# Patient Record
Sex: Female | Born: 1935
Health system: Southern US, Community
[De-identification: ages and names within clinical notes are randomized; demographics above are authoritative.]

## PROBLEM LIST (undated history)

## (undated) DIAGNOSIS — I10 Essential (primary) hypertension: Secondary | ICD-10-CM

## (undated) DIAGNOSIS — D649 Anemia, unspecified: Secondary | ICD-10-CM

## (undated) DIAGNOSIS — K5730 Diverticulosis of large intestine without perforation or abscess without bleeding: Secondary | ICD-10-CM

## (undated) DIAGNOSIS — I739 Peripheral vascular disease, unspecified: Secondary | ICD-10-CM

## (undated) DIAGNOSIS — E782 Mixed hyperlipidemia: Secondary | ICD-10-CM

## (undated) DIAGNOSIS — K219 Gastro-esophageal reflux disease without esophagitis: Secondary | ICD-10-CM

## (undated) DIAGNOSIS — E119 Type 2 diabetes mellitus without complications: Secondary | ICD-10-CM

## (undated) DIAGNOSIS — I779 Disorder of arteries and arterioles, unspecified: Secondary | ICD-10-CM

## (undated) HISTORY — DX: Mixed hyperlipidemia: E78.2

## (undated) HISTORY — DX: Peripheral vascular disease, unspecified: I73.9

## (undated) HISTORY — DX: Disorder of arteries and arterioles, unspecified: I77.9

## (undated) HISTORY — DX: Anemia, unspecified: D64.9

## (undated) HISTORY — DX: Essential (primary) hypertension: I10

## (undated) HISTORY — DX: Diverticulosis of large intestine without perforation or abscess without bleeding: K57.30

## (undated) HISTORY — PX: EYE SURGERY: SHX253

## (undated) HISTORY — DX: Gastro-esophageal reflux disease without esophagitis: K21.9

## (undated) HISTORY — DX: Type 2 diabetes mellitus without complications: E11.9

---

## 2006-05-20 ENCOUNTER — Ambulatory Visit: Payer: Self-pay | Admitting: Cardiology

## 2006-06-11 ENCOUNTER — Ambulatory Visit: Payer: Self-pay | Admitting: Gastroenterology

## 2006-06-11 ENCOUNTER — Ambulatory Visit (HOSPITAL_COMMUNITY): Admission: RE | Admit: 2006-06-11 | Discharge: 2006-06-11 | Payer: Self-pay | Admitting: Gastroenterology

## 2009-08-02 ENCOUNTER — Ambulatory Visit: Payer: Self-pay | Admitting: Vascular Surgery

## 2009-08-24 ENCOUNTER — Encounter: Payer: Self-pay | Admitting: Vascular Surgery

## 2009-08-24 ENCOUNTER — Ambulatory Visit: Payer: Self-pay | Admitting: Vascular Surgery

## 2009-08-24 ENCOUNTER — Inpatient Hospital Stay (HOSPITAL_COMMUNITY): Admission: RE | Admit: 2009-08-24 | Discharge: 2009-08-25 | Payer: Self-pay | Admitting: Vascular Surgery

## 2009-08-24 HISTORY — PX: CAROTID ENDARTERECTOMY: SUR193

## 2009-08-24 HISTORY — PX: OTHER SURGICAL HISTORY: SHX169

## 2009-09-13 ENCOUNTER — Ambulatory Visit: Payer: Self-pay | Admitting: Vascular Surgery

## 2010-03-06 ENCOUNTER — Ambulatory Visit: Admit: 2010-03-06 | Payer: Self-pay | Admitting: Vascular Surgery

## 2010-03-06 ENCOUNTER — Ambulatory Visit
Admission: RE | Admit: 2010-03-06 | Discharge: 2010-03-06 | Payer: Self-pay | Source: Home / Self Care | Attending: Vascular Surgery | Admitting: Vascular Surgery

## 2010-05-12 LAB — CBC
Hemoglobin: 10 g/dL — ABNORMAL LOW (ref 12.0–15.0)
Hemoglobin: 10.8 g/dL — ABNORMAL LOW (ref 12.0–15.0)
MCH: 33.7 pg (ref 26.0–34.0)
MCHC: 33.7 g/dL (ref 30.0–36.0)
RBC: 2.96 MIL/uL — ABNORMAL LOW (ref 3.87–5.11)
RBC: 3.27 MIL/uL — ABNORMAL LOW (ref 3.87–5.11)
RDW: 17.6 % — ABNORMAL HIGH (ref 11.5–15.5)
RDW: 17.7 % — ABNORMAL HIGH (ref 11.5–15.5)

## 2010-05-12 LAB — COMPREHENSIVE METABOLIC PANEL
ALT: 13 U/L (ref 0–35)
AST: 20 U/L (ref 0–37)
Alkaline Phosphatase: 77 U/L (ref 39–117)
BUN: 11 mg/dL (ref 6–23)
CO2: 28 mEq/L (ref 19–32)
Chloride: 106 mEq/L (ref 96–112)
GFR calc Af Amer: >60 mL/min (ref >60)
Glucose, Bld: 87 mg/dL (ref 70–99)
Potassium: 4.2 mEq/L (ref 3.5–5.1)

## 2010-05-12 LAB — GLUCOSE, CAPILLARY
Glucose-Capillary: 106 mg/dL — ABNORMAL HIGH (ref 70–99)
Glucose-Capillary: 213 mg/dL — ABNORMAL HIGH (ref 70–99)
Glucose-Capillary: 79 mg/dL (ref 70–99)
Glucose-Capillary: 88 mg/dL (ref 70–99)

## 2010-05-12 LAB — TYPE AND SCREEN

## 2010-05-12 LAB — URINE MICROSCOPIC-ADD ON

## 2010-05-12 LAB — URINALYSIS, ROUTINE W REFLEX MICROSCOPIC
Bilirubin Urine: NEGATIVE
Hgb urine dipstick: NEGATIVE
Ketones, ur: NEGATIVE mg/dL
Nitrite: NEGATIVE
Protein, ur: NEGATIVE mg/dL

## 2010-05-12 LAB — HEMOGLOBIN A1C: Hgb A1c MFr Bld: 7.3 % — ABNORMAL HIGH (ref <5.7)

## 2010-05-12 LAB — BASIC METABOLIC PANEL
CO2: 27 mEq/L (ref 19–32)
Calcium: 8.8 mg/dL (ref 8.4–10.5)
Chloride: 100 mEq/L (ref 96–112)
GFR calc Af Amer: >60 mL/min (ref >60)
Glucose, Bld: 213 mg/dL — ABNORMAL HIGH (ref 70–99)

## 2010-05-12 LAB — PROTIME-INR
INR: 0.98 (ref 0.00–1.49)
Prothrombin Time: 12.9 seconds (ref 11.6–15.2)

## 2010-05-12 LAB — ABO/RH: ABO/RH(D): O POS

## 2010-05-12 LAB — APTT: aPTT: 30 seconds (ref 24–37)

## 2010-07-09 NOTE — Procedures (Signed)
CAROTID DUPLEX EXAM   INDICATION:  Carotid disease.   HISTORY:  Diabetes:  Yes.  Cardiac:  No.  Hypertension:  Yes.  Smoking:  Yes.  Previous Surgery:  No.  CV History:  Currently asymptomatic.  Amaurosis Fugax No, Paresthesias No, Hemiparesis No.                                       RIGHT             LEFT  Brachial systolic pressure:         154               152  Brachial Doppler waveforms:         Normal            Normal  Vertebral direction of flow:        Antegrade         Antegrade  DUPLEX VELOCITIES (cm/sec)  CCA peak systolic                   74                57  ECA peak systolic                   99                123456  ICA peak systolic                   102               99991111  ICA end diastolic                   21                323  PLAQUE MORPHOLOGY:                  Calcific          Calcific  PLAQUE AMOUNT:                      Mild              Severe  PLAQUE LOCATION:                    ICA/distal CCA    ICA/ECA/distal CCA   IMPRESSION:  1. No hemodynamically significant stenosis of the right internal      carotid artery.  2. Doppler velocities suggest an 80% to 99% stenosis of the left      proximal internal carotid artery.   ___________________________________________  Judeth Cornfield. Scot Dock, M.D.   CH/MEDQ  D:  08/02/2009  T:  08/02/2009  Job:  [id number]

## 2010-07-09 NOTE — Assessment & Plan Note (Signed)
OFFICE VISIT   Marie, Hunt  DOB:  04/16/1932                                       09/13/2009  [id number]   I saw the patient in the office today for followup after her recent left  carotid endarterectomy.  This is a pleasant 77 year old woman who was  found to have a left carotid bruit by Dr. Manuella Ghazi.  This prompted a duplex  scan which showed a greater than 80% left carotid stenosis.  Left  carotid endarterectomy was recommended in order to lower her risk of  future stroke.  She underwent left carotid endarterectomy with Dacron  patch angioplasty on 08/24/2009.  She did well postoperatively and was  discharged on postoperative day #1.  She returns for her first followup  visit.  She has no specific complaints and overall has been doing quite  well.  She is anxious to return to work.   On examination her incision has healed nicely.  Blood pressure is  161/70, heart rate is 77.  She has no focal weakness or paresthesias.   Overall I am pleased with progress.  I will see her back in 6 months  with a followup duplex scan.  She knows to call sooner if she has  problems.     Judeth Cornfield. Scot Dock, M.D.  Electronically Signed   CSD/MEDQ  D:  09/13/2009  T:  09/14/2009  Job:  [id number]   cc:   Monico Blitz, MD

## 2010-07-09 NOTE — Consult Note (Signed)
NEW PATIENT CONSULTATION   Marie Hunt, Marie Hunt  DOB:  12/10/1932                                       08/02/2009  [id number]   I saw the patient in the office today in consultation concerning a tight  left carotid stenosis.  This is a pleasant 77 year old right-handed  woman who was found to have a left carotid bruit.  She was sent for  carotid evaluation.  Of note, she has had no previous history of stroke,  TIAs, expressive or receptive aphasia, or amaurosis fugax.   Her past medical history is significant for diabetes, hypertension and  hypercholesterolemia, all of which have been stable on her current  medications and are followed closely by Dr. Manuella Ghazi.  She denies any  history of previous myocardial infarction, history of congestive heart  failure or history of COPD.   SOCIAL HISTORY:  She is widowed.  She has six children.  She smokes a  half a pack per day and has been smoking for 50 years.   FAMILY HISTORY:  She has had multiple sisters who had coronary artery  disease.  She is unaware of any history of premature cardiovascular  disease.   Allergies are codeine which causes nausea, vomiting.   MEDICATIONS:  1. Lasix 20 mg p.o. daily.  2. Aspirin 81 mg p.o. daily.  3. Diclofenac sodium 75 mg p.o. daily.  4. Metoprolol ER 25 mg p.o. daily.  5. Amlodipine 10 mg p.o. daily.  6. Metformin 500 mg p.o. b.i.d.  7. Simvastatin 20 mg p.o. daily.  8. Os-Cal daily.   REVIEW OF SYSTEMS:  GENERAL:  She has had no recent weight loss.  She  has had some mild weight gain.  She has had no change in her appetite.  CARDIOVASCULAR:  She has had no chest pain, chest pressure, palpitations  or arrhythmias.  She has had no claudication, rest pain or nonhealing  ulcers.  She has had no history of DVT or phlebitis.  HEMATOLOGIC:  She does have a history of anemia.  MUSCULOSKELETAL:  She does have a history of arthritis.  GI, neurologic, pulmonary, GU, ENT,  psychiatric review of systems is  unremarkable and is documented on the medical history form in her chart.   PHYSICAL EXAMINATION:  General:  This is a pleasant 77 year old woman  who appears her stated age.  Vital signs:  Blood pressure 116/67, heart  rate is 73, saturation 100%.  HEENT:  Unremarkable.  Lungs:  Are clear  bilaterally to auscultation without rales, rhonchi or wheezing.  Cardiovascular:  She has a harsh left carotid bruit.  She has a regular  rate and rhythm.  She has palpable femoral pulses and warm, well-  perfused feet without ischemic ulcers.  Abdomen:  Soft and nontender  with normal pitched bowel sounds.  No masses are appreciated.  Musculoskeletal:  There are no major deformities or cyanosis.  Neurological:  She has no focal weakness or paresthesias.  Skin:  There  are no ulcers or rashes.   I have ordered a carotid duplex scan which I independently interpreted  and this shows a very tight left carotid stenosis probably greater than  90%.  Peak systolic velocity is 99991111 cm/sec with an end-diastolic  velocity of XX123456 cm/sec.  There is no significant stenosis on the right  side and both vertebral arteries  are patent with normally directed flow.  Arm pressures are essentially equal.   Given the tight left carotid stenosis I have recommended left carotid  endarterectomy in order to lower her risk of future stroke.  I have  explained that although she is asymptomatic given the stenosis is  clearly greater than 80% we can lower her risk of stroke with surgery.  We have discussed the indications for surgery and the potential  complications including but not limited to bleeding, stroke (peri  procedural risk 1%-2%), nerve injury, MI, or other unpredictable medical  problems.  All of her questions were answered, however, currently she  would not like this to schedule surgery until she has a chance to  discuss this with Dr. Manuella Ghazi.  I have encouraged her to continue taking   her aspirin and have also discussed the importance of tobacco cessation.  Hopefully we will hear from her in the near future to schedule left  carotid endarterectomy.     Judeth Cornfield. Scot Dock, M.D.  Electronically Signed   CSD/MEDQ  D:  08/02/2009  T:  08/03/2009  Job:  [id number]   cc:   Monico Blitz, MD

## 2010-07-09 NOTE — Assessment & Plan Note (Signed)
OFFICE VISIT   Marie Hunt, Marie Hunt  DOB:  Dec 11, 1932                                       03/06/2010  [id number]   The patient is seen in the office today for continued follow-up of her  carotid disease.  This is a pleasant 77 year old woman who underwent  left carotid endarterectomy in July 2011 for a tight left carotid  stenosis.  She comes in for a 50-month follow-up visit.  Since I saw her  last she has had no history of stroke, TIAs, expressive or receptive  aphasia or amaurosis fugax.   SOCIAL HISTORY:  She does continue to smoke half a pack per day of  cigarettes.   PAST MEDICAL HISTORY:  Significant for diabetes, hypertension, and  hypercholesterolemia.  She has no history of previous myocardial  infarction or history congestive heart failure.   REVIEW OF SYSTEMS:  CARDIOVASCULAR:  She had no chest pain, chest  pressure, palpitations or arrhythmias.  PULMONARY:  She had no productive cough,  bronchitis, asthma or  wheezing.  MUSCULOSKELETAL:  She has had some arthritic pain in her left hand.  She  also complained of some bilateral lower extremity swelling more  significant on the left side.   PHYSICAL EXAMINATION:  This is a pleasant 77 year old woman who appears  her stated age.  Blood pressure is 187/67, heart rate is 81, saturation  98%.  Lungs:  Clear bilaterally to auscultation without rales, rhonchi  or wheezing.  Cardiovascular exam:  I do not detect any carotid bruits.  She has a regular rate and rhythm.  She has palpable femoral pulses and  warm well-perfused feet.  She has bilateral pitting edema more  significant on the left side.  Abdomen:  Soft and nontender with normal  pitched bowel sounds.  Neurologic exam:  She has no focal weakness or  paresthesias.   I did independently interpret her carotid duplex scan today which shows  that her left carotid endarterectomy site is widely patent.  She has no  significant stenosis  on the right side.  Both vertebral arteries are  patent with normally directed flow.   Overall I am pleased with her progress and I have ordered follow-up  carotid duplex scan in 6 months.  I will plan on seeing her back in 1  year unless she has any change in her duplex.  I have encouraged her to  continue taking her aspirin and we have also discussed again the  importance of tobacco cessation.     Judeth Cornfield. Scot Dock, M.D.  Electronically Signed   CSD/MEDQ  D:  03/06/2010  T:  03/07/2010  Job:  [id number]   cc:   Monico Blitz, MD

## 2010-07-09 NOTE — Procedures (Signed)
CAROTID DUPLEX EXAM   INDICATION:  Follow up left CEA.   HISTORY:  Diabetes:  Yes.  Cardiac:  No.  Hypertension:  Yes.  Smoking:  Yes.  Previous Surgery:  Left CEA 08/24/09.  CV History:  Amaurosis Fugax No, Paresthesias No, hemiparesis No.                                       RIGHT             LEFT  Brachial systolic pressure:         180               170  Brachial Doppler waveforms:         WNL               WNL  Vertebral direction of flow:        Antegrade         Antegrade  DUPLEX VELOCITIES (cm/sec)  CCA peak systolic                   74                78  ECA peak systolic                   67                [id number]  ICA peak systolic                   87 (M)            35  ICA end diastolic                   23 (M)            12  PLAQUE MORPHOLOGY:                  Calcific          NA  PLAQUE AMOUNT:                      Mild              NA  PLAQUE LOCATION:                    ICA/ECA           NA   IMPRESSION:  1. Widely patent left carotid endarterectomy without evidence of      restenosis or hyperplasia.  2. Calcific plaque in the right internal carotid artery prevents      accurate evaluation; however, waveforms and velocities just distal      to acoustic shadow appear within normal limits.  1% to 39%      suspected right internal carotid artery stenosis.  3. Bilateral antegrade vertebral arteries.   ___________________________________________  Judeth Cornfield. Scot Dock, M.D.   LT/MEDQ  D:  03/06/2010  T:  03/06/2010  Job:  [id number]

## 2010-07-12 NOTE — Op Note (Signed)
NAME:  Marie Hunt, Marie Hunt            ACCOUNT NO.:  [id number]   MEDICAL RECORD NO.:  [id number]          PATIENT TYPE:  AMB   LOCATION:  DAY                           FACILITY:  [facility]   PHYSICIAN:  Caro Hight, M.D.      DATE OF BIRTH:  08-23-1932   DATE OF PROCEDURE:  06/11/2006  DATE OF DISCHARGE:                               OPERATIVE REPORT   PROCEDURE PERFORMED:  Colonoscopy.   ENDOSCOPIST:  Caro Hight, M.D.   INDICATIONS FOR PROCEDURE:  Marie Hunt is a 77 year old female who  presents for average-risk colon cancer screening.   FINDINGS:  1. Sigmoid diverticulosis; otherwise normal: without evidence of      polyps, masses, inflammatory changes, or AVMs.  2. Normal retroflexed view of the rectum.   RECOMMENDATIONS:  1. High-fiber diet:  Handout given on diverticulosis and high-fiber      diet.  2. Screening colonoscopy in 10 years.   MEDICATIONS:  1. Demerol 75 mg IV.  2. Versed 4 mg IV.   PROCEDURE TECHNIQUE:  A physical exam was performed and informed consent  was obtained from the patient after explaining the benefits, risks, and  alternatives to the procedure.  The patient was connected to the monitor  and placed in the left lateral position.  Continuous oxygen was provided  with nasal cannula and IV medications administered through an indwelling  cannula.  After administration of sedation and rectal exam, the  patient's rectum  was intubated and the scope was advanced under direct visualization to  the cecum.  The scope was subsequently removed, slowly, by carefully  examining the color, texture, anatomy, and integrity of the mucosa on  the way out.  The patient was recovered in endoscopy and discharged home  in satisfactory condition.      Caro Hight, M.D.  Electronically Signed     SM/MEDQ  D:  06/11/2006  T:  06/11/2006  Job:  [id number]   cc:   Monico Blitz, MD  Fax: [phone]

## 2010-09-12 ENCOUNTER — Other Ambulatory Visit (INDEPENDENT_AMBULATORY_CARE_PROVIDER_SITE_OTHER): Payer: Medicare Other

## 2010-09-12 DIAGNOSIS — Z48812 Encounter for surgical aftercare following surgery on the circulatory system: Secondary | ICD-10-CM

## 2010-09-12 DIAGNOSIS — I6529 Occlusion and stenosis of unspecified carotid artery: Secondary | ICD-10-CM

## 2010-09-18 NOTE — Procedures (Unsigned)
CAROTID DUPLEX EXAM  INDICATION:  Followup carotid stenosis.  HISTORY: Diabetes:  Yes Cardiac:  No Hypertension:  Yes Smoking:  Currently Previous Surgery:  Left carotid endarterectomy on 08/24/2009 CV History:  Asymptomatic Amaurosis Fugax No, Paresthesias No, Hemiparesis No                                      RIGHT             LEFT Brachial systolic pressure:         186               166 Brachial Doppler waveforms:         WNL               WNL Vertebral direction of flow:        Antegrade         Antegrade DUPLEX VELOCITIES (cm/sec) CCA peak systolic                   75                96 ECA peak systolic                   149               0000000 ICA peak systolic                   108               63 ICA end diastolic                   10                15 PLAQUE MORPHOLOGY:                  Heterogeneous     Not visualized PLAQUE AMOUNT:                      Mild              Not visualized PLAQUE LOCATION:                    CCA/ICA/ECA       Not visualized  IMPRESSION: 1. Right internal carotid artery stenosis in the 1% to 39% range. 2. Right external carotid artery stenosis present. 3. Left internal carotid artery appears patent with history of     endarterectomy on 08/24/2009. 4. Unchanged since previous study on 03/06/2010.  ___________________________________________ Judeth Cornfield. Scot Dock, M.D.  SH/MEDQ  D:  09/12/2010  T:  09/12/2010  Job:  [id number]

## 2010-10-23 ENCOUNTER — Ambulatory Visit: Payer: Medicare Other | Admitting: Cardiology

## 2010-10-23 ENCOUNTER — Encounter: Payer: Self-pay | Admitting: Cardiology

## 2010-10-30 ENCOUNTER — Encounter: Payer: Self-pay | Admitting: Cardiology

## 2010-11-08 ENCOUNTER — Other Ambulatory Visit: Payer: Self-pay | Admitting: Cardiology

## 2010-11-08 ENCOUNTER — Ambulatory Visit (INDEPENDENT_AMBULATORY_CARE_PROVIDER_SITE_OTHER): Payer: Medicare Other | Admitting: Cardiology

## 2010-11-08 ENCOUNTER — Telehealth: Payer: Self-pay | Admitting: *Deleted

## 2010-11-08 ENCOUNTER — Encounter: Payer: Self-pay | Admitting: Cardiology

## 2010-11-08 DIAGNOSIS — K8020 Calculus of gallbladder without cholecystitis without obstruction: Secondary | ICD-10-CM

## 2010-11-08 DIAGNOSIS — I10 Essential (primary) hypertension: Secondary | ICD-10-CM

## 2010-11-08 DIAGNOSIS — I779 Disorder of arteries and arterioles, unspecified: Secondary | ICD-10-CM

## 2010-11-08 DIAGNOSIS — Z01818 Encounter for other preprocedural examination: Secondary | ICD-10-CM

## 2010-11-08 DIAGNOSIS — E785 Hyperlipidemia, unspecified: Secondary | ICD-10-CM

## 2010-11-08 DIAGNOSIS — O24919 Unspecified diabetes mellitus in pregnancy, unspecified trimester: Secondary | ICD-10-CM | POA: Insufficient documentation

## 2010-11-08 NOTE — Progress Notes (Signed)
HPI: Marie Hunt is a pleasant 77 y/o patient of Dr. Manuella Ghazi PCP in [place], and Dr. Truddie Hidden, general surgeon in [place] we are asked to see for pre-operative evaluation for cholecystectomy.  She has a history of PAD, with carotid artery disease, s/p left carotid artery endarterectomy, Type II diabetes, hypertension, and mixed hyperlipidemia.  She has no known CAD.   She apparently has had cardiac testing in the past to include stress myoview and echocardiogram at [facility] in [place]. Records are being requested  She denies chest pain, DOE, dizziness.  She has GERD symptoms and low grade nausea at times. She remains active, walking and working in a [employer] in [employer]. She has no symptoms that are stopping her from her activities. She has complaints of Left leg LEE, and has had a recent doppler ultrasound with Dr. Manuella Ghazi this week. Records are being requested.  Allergies  Allergen Reactions  . Codeine Nausea And Vomiting    Current Outpatient Prescriptions  Medication Sig Dispense Refill  . amLODipine (NORVASC) 10 MG tablet Take 10 mg by mouth daily.        Marland Kitchen aspirin 81 MG tablet Take 81 mg by mouth daily.        . calcium-vitamin D (OSCAL WITH D) 500-200 MG-UNIT per tablet Take 1 tablet by mouth daily.        . diclofenac (VOLTAREN) 75 MG EC tablet Take 75 mg by mouth 2 (two) times daily.        . ferrous sulfate 325 (65 FE) MG tablet Take 325 mg by mouth daily with breakfast.        . furosemide (LASIX) 20 MG tablet Take 20 mg by mouth daily as needed.        Marland Kitchen glimepiride (AMARYL) 2 MG tablet Take 2 mg by mouth daily before breakfast.        . metFORMIN (GLUCOPHAGE) 500 MG tablet Take 500 mg by mouth 2 (two) times daily with a meal. Take 2 tabs by mouth 2 times a day.       . metoprolol succinate (TOPROL-XL) 25 MG 24 hr tablet Take 25 mg by mouth daily.        Marland Kitchen omeprazole (PRILOSEC) 20 MG capsule Take 20 mg by mouth daily.        . simvastatin (ZOCOR) 20 MG tablet Take 20 mg by mouth  at bedtime.          Past Medical History  Diagnosis Date  . GERD (gastroesophageal reflux disease)   . Carotid artery disease     123456 RICA, patent LICA [id number] - Dr. Scot Dock  . Essential hypertension, benign   . Type 2 diabetes mellitus   . Mixed hyperlipidemia   . Sigmoid diverticulosis   . Cholelithiasis   . CVA (cerebral infarction)   . Stroke   . Diabetes mellitus     Type II  . Cholelithiasis     In need of cholecystectomy  . Sigmoid diverticulosis   . GERD (gastroesophageal reflux disease)     Past Surgical History  Procedure Date  . Left carotid endarterectomy 7/11    Dr. Scot Dock  . Carotid endarterectomy 08/24/2009    Left catroid endarterectomy    Family History  Problem Relation Age of Onset  . Coronary artery disease    . Heart disease Sister   . Heart disease Sister   . Heart disease Sister     History   Social History  . Marital Status: Widowed  Spouse Name: N/A    Number of Children: N/A  . Years of Education: N/A   Occupational History  . Not on file.   Social History Main Topics  . Smoking status: Current Everyday Smoker -- 0.5 packs/day for 60 years    Types: Cigarettes  . Smokeless tobacco: Never Used  . Alcohol Use: No  . Drug Use: No  . Sexually Active: No   Other Topics Concern  . Not on file   Social History Narrative  . No narrative on file    [id number] of systems complete and found to be negative unless listed above  PHYSICAL EXAM BP 131/57  Pulse 67  Resp 18  Ht 5\' 3"  (1.6 m)  Wt 175 lb (79.379 kg)  BMI 31.00 kg/m2  SpO2 97% General: Well developed, well nourished, in no acute distress Head: Eyes PERRLA, No xanthomas.   Normal cephalic and atramatic  Lungs: Clear bilaterally to auscultation and percussion. Heart: HRRR S1 S2, with 2/6 RSB and 1/6 RSB radiating into the apex.  Pulses are 2+ & equal radially.           Left carotid bruit. No JVD.  No abdominal bruits. No femoral bruits. Abdomen: Bowel sounds are  positive, abdomen soft and non-tender without masses or                  Hernia's noted. Msk:  Back normal, normal gait. Normal strength and tone for age. Extremities: No clubbing, cyanosis or with Left leg edema.  DP +1 Neuro: Alert and oriented X 3. Psych:  Good affect, responds appropriately  EKG: NSR, possible Right atrial enlargement.  LVH. Rate of 69 bpm.:  ASSESSMENT AND PLAN

## 2010-11-08 NOTE — Progress Notes (Signed)
Addended by: Dennie Fetters on: 11/08/2010 10:29 AM   Modules accepted: Orders

## 2010-11-08 NOTE — Telephone Encounter (Signed)
Auth # [id number]  Exp 12/23/10

## 2010-11-08 NOTE — Progress Notes (Signed)
Patient seen and examined with Marie Hunt. She is referred for preoperative evaluation prior to elective cholecystectomy based on available information. She is followed by Dr. Manuella Ghazi, also seen recently by Dr. Truddie Hidden. Her history is reviewed including known carotid artery disease status post prior CVA, hypertension, type 2 diabetes mellitus, hyperlipidemia. She has no clearly documented history of ischemic heart disease. 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 No palpitations or syncope. Main complaints include right upper and lower quadrant discomfort, some right leg discomfort, intermittent lower extremity edema as well.  She reports that she may have had a stress test at [facility] internal medicine approximately a year ago, however no results are available. It also appears that she may have had a stress test at [facility] in 2008, although again results are not available. We were able to locate an echocardiogram done at [facility] internal medicine in May of 2011 demonstrating an LVEF of 60-70% with mild LVH, diastolic dysfunction, mildly sclerotic aortic valve, trace mitral regurgitation with thickened leaflets, mild aortic regurgitation, and mild tricuspid regurgitation. Also minimally increased trans-pulmonic velocities.  On exam she has no loud carotid bruits, lungs are clear, and she does have a 99991111 systolic murmur heard best at the right base, preserved second heart sound. Chronic appearing lower extremity edema.  In light of her cardiac risk factor profile, no ischemic evaluation in 4 years as best I can tell, and cardiac murmur on exam with prior documentation of sclerotic aortic valve and reduced non-coronary cusp motion, our plan will be to followup 2-D echocardiogram to reassess valvular status and also arrange a Lexiscan Myoview to better determine perioperative cardiac risk. When this information  is available, we can forward final recommendations to Dr. Manuella Ghazi and Dr. Truddie Hidden.

## 2010-11-08 NOTE — Assessment & Plan Note (Signed)
Patient has been seen and examined by myself and by Dr. Domenic Polite. Extensive review of available records was also completed. She is asymptomatic at this time from cardiology standpoint. Blood pressure is well controlled. If she has had a negative stress test within the last 2 years no further cardiac testing is recommended prior to surgery and she is considered low risk for cardiac event. Please see Dr. Myles Gip addendum for more recommendations after all records are obtained and reviewed.

## 2010-11-08 NOTE — Telephone Encounter (Signed)
Lexiscan cardiolite and 2 D Echo scheduled for 11-12-2010 @ [facility]

## 2010-11-08 NOTE — Patient Instructions (Signed)
Your physician recommends that you continue on your current medications as directed. Please refer to the Current Medication list given to you today.  Your physician has requested that you have an echocardiogram. Echocardiography is a painless test that uses sound waves to create images of your heart. It provides your doctor with information about the size and shape of your heart and how well your heart's chambers and valves are working. This procedure takes approximately one hour. There are no restrictions for this procedure.  Your physician has requested that you have a lexiscan myoview. For further information please visit [email or web address]. Please follow instruction sheet, as given.  Your physician recommends that you schedule a follow-up appointment in: after test

## 2010-11-11 ENCOUNTER — Encounter: Payer: Self-pay | Admitting: Adult Health

## 2010-11-12 DIAGNOSIS — R0602 Shortness of breath: Secondary | ICD-10-CM

## 2010-11-13 ENCOUNTER — Encounter: Payer: Self-pay | Admitting: *Deleted

## 2010-11-14 ENCOUNTER — Other Ambulatory Visit: Payer: Self-pay | Admitting: Cardiology

## 2010-11-14 ENCOUNTER — Encounter: Payer: Self-pay | Admitting: Cardiology

## 2010-11-14 DIAGNOSIS — Z01818 Encounter for other preprocedural examination: Secondary | ICD-10-CM

## 2010-11-15 ENCOUNTER — Ambulatory Visit (INDEPENDENT_AMBULATORY_CARE_PROVIDER_SITE_OTHER): Payer: Medicare Other | Admitting: Cardiology

## 2010-11-15 ENCOUNTER — Encounter: Payer: Self-pay | Admitting: Cardiology

## 2010-11-15 VITALS — BP 130/70 | HR 65 | Ht 67.0 in | Wt 173.8 lb

## 2010-11-15 DIAGNOSIS — Z01818 Encounter for other preprocedural examination: Secondary | ICD-10-CM

## 2010-11-15 DIAGNOSIS — I779 Disorder of arteries and arterioles, unspecified: Secondary | ICD-10-CM

## 2010-11-15 DIAGNOSIS — R011 Cardiac murmur, unspecified: Secondary | ICD-10-CM | POA: Insufficient documentation

## 2010-11-15 NOTE — Assessment & Plan Note (Signed)
Likely related to aortic sclerosis, no clear aortic stenosis however. Mild aortic regurgitation noted.

## 2010-11-15 NOTE — Assessment & Plan Note (Signed)
Should be able to proceed with elective cholecystectomy if indicated, at an acceptable perioperative cardiac risk. Cardiolite was low risk arguing against any major obstructive underlying CAD, and echocardiogram does not demonstrate any significant valvular abnormalities that would increase her risk. No further cardiac testing is planned at this time. She can continue to follow up with Dr. Manuella Ghazi.

## 2010-11-15 NOTE — Progress Notes (Signed)
Clinical Summary Ms. Katzenstein is a 77 y.o.female presenting for followup. She was seen just recently in the [place] office for a preoperative evaluation prior to elective cholecystectomy. She was referred for followup testing including an echocardiogram and Myoview, outlined below, both of which were reassuring. We discussed these results today.  She denies any chest pain. Her cardiac murmur is likely related to aortic sclerosis, no clear evidence of stenosis by echocardiogram. She continues to contemplate elective cholecystectomy, and plans to see Dr. Truddie Hidden back for further discussion. At this point no further cardiac testing is anticipated.   Allergies  Allergen Reactions  . Codeine Nausea And Vomiting    Medication list reviewed.  Past Medical History  Diagnosis Date  . GERD (gastroesophageal reflux disease)   . Carotid artery disease     123456 RICA, patent LICA [id number] - Dr. Scot Dock  . Essential hypertension, benign   . Type 2 diabetes mellitus   . Mixed hyperlipidemia   . Sigmoid diverticulosis   . Cholelithiasis   . CVA (cerebral infarction)   . Stroke   . Diabetes mellitus     Type II  . Cholelithiasis     In need of cholecystectomy  . Sigmoid diverticulosis   . GERD (gastroesophageal reflux disease)     Past Surgical History  Procedure Date  . Left carotid endarterectomy 7/11    Dr. Scot Dock  . Carotid endarterectomy 08/24/2009    Left catroid endarterectomy    Family History  Problem Relation Age of Onset  . Coronary artery disease    . Heart disease Sister   . Heart disease Sister   . Heart disease Sister     Social History Ms. Leinberger reports that she has been smoking Cigarettes.  She has a 18 pack-year smoking history. She has never used smokeless tobacco. Ms. Pahl reports that she does not drink alcohol.  Review of Systems As outlined above. Otherwise negative.  Physical Examination Filed Vitals:   11/15/10 1124  BP: 130/70  Pulse:  65  Overweight woman in no acute distress. HEENT: Conjunctiva and lids normal, oropharynx clear. Neck: Supple, no elevated JVP or carotid bruits. Lungs: Clear to auscultation, nonlabored. Cardiac: Regular rate and rhythm with 2/6 systolic murmur at the base. No S3. Extremities: No pitting edema.   Studies Echocardiogram 11/12/2010: LVEF 55-60% with mild LVH, diastolic dysfunction, mild aortic calcification without stenosis, mild aortic regurgitation, trace mitral regurgitation, mild tricuspid regurgitation, aortic atherosclerosis.  Lexiscan Cardiolite 11/12/2010: Evidence of attenuation artifact without obvious scar or ischemia, LVEF 61%.   Problem List and Plan

## 2010-11-15 NOTE — Assessment & Plan Note (Signed)
Nonobstructive by recent Dopplers.

## 2010-11-15 NOTE — Patient Instructions (Signed)
Continue all current medications. Follow up as needed  

## 2011-09-16 ENCOUNTER — Encounter: Payer: Self-pay | Admitting: Neurosurgery

## 2011-09-17 ENCOUNTER — Ambulatory Visit (INDEPENDENT_AMBULATORY_CARE_PROVIDER_SITE_OTHER): Payer: Medicare Other | Admitting: Neurosurgery

## 2011-09-17 ENCOUNTER — Ambulatory Visit (INDEPENDENT_AMBULATORY_CARE_PROVIDER_SITE_OTHER): Payer: Medicare Other | Admitting: *Deleted

## 2011-09-17 ENCOUNTER — Encounter: Payer: Self-pay | Admitting: Neurosurgery

## 2011-09-17 VITALS — BP 165/67 | HR 67 | Resp 18 | Ht 64.0 in | Wt 167.0 lb

## 2011-09-17 DIAGNOSIS — I6529 Occlusion and stenosis of unspecified carotid artery: Secondary | ICD-10-CM

## 2011-09-17 DIAGNOSIS — Z48812 Encounter for surgical aftercare following surgery on the circulatory system: Secondary | ICD-10-CM

## 2011-09-17 NOTE — Progress Notes (Signed)
[facility] Carot[facility] Office Note  CC: Annual carotid duplex for surveillance Referring Physician: Scot Dock  History of Present Illness: 78 year old female patient of Dr. Nicole Cella who status post left CEA in July 2011. The patient reports no signs or symptoms of CVA, TIA, amaurosis fugax or any neural deficit. The patient denies any new medical diagnoses or recent surgeries. The patient does ask about her lower extremity edema which is greater on the left side, I explained to the patient the compression hose would help as well as leg elevation and some diuretic. Patient states she takes a diuretic and that her primary care doctor told her the same thing that I did.  Past Medical History  Diagnosis Date  . GERD (gastroesophageal reflux disease)   . Carotid artery disease     123456 RICA, patent LICA [id number] - Dr. Scot Dock  . Essential hypertension, benign   . Type 2 diabetes mellitus   . Mixed hyperlipidemia   . Sigmoid diverticulosis   . Cholelithiasis   . CVA (cerebral infarction)   . Stroke   . Diabetes mellitus     Type II  . Cholelithiasis     In need of cholecystectomy  . Sigmoid diverticulosis   . GERD (gastroesophageal reflux disease)     ROS: [x]  Positive   [ ]  Denies    General: [ ]  Weight loss, [ ]  Fever, [ ]  chills Neurologic: [ ]  Dizziness, [ ]  Blackouts, [ ]  Seizure [ ]  Stroke, [ ]  "Mini stroke", [ ]  Slurred speech, [ ]  Temporary blindness; [ ]  weakness in arms or legs, [ ]  Hoarseness Cardiac: [ ]  Chest pain/pressure, [ ]  Shortness of breath at rest [ ]  Shortness of breath with exertion, [ ]  Atrial fibrillation or irregular heartbeat Vascular: [ ]  Pain in legs with walking, [ ]  Pain in legs at rest, [ ]  Pain in legs at night,  [ ]  Non-healing ulcer, [ ]  Blood clot in vein/DVT,   Pulmonary: [ ]  Home oxygen, [ ]  Productive cough, [ ]  Coughing up blood, [ ]  Asthma,  [ ]  Wheezing Musculoskeletal:  [ ]  Arthritis, [ ]  Low back pain, [ ]  Joint  pain Hematologic: [ ]  Easy Bruising, [ ]  Anemia; [ ]  Hepatitis Gastrointestinal: [ ]  Blood in stool, [ ]  Gastroesophageal Reflux/heartburn, [ ]  Trouble swallowing Urinary: [ ]  chronic Kidney disease, [ ]  on HD - [ ]  MWF or [ ]  TTHS, [ ]  Burning with urination, [ ]  Difficulty urinating Skin: [ ]  Rashes, [ ]  Wounds Psychological: [ ]  Anxiety, [ ]  Depression   Social History History  Substance Use Topics  . Smoking status: Current Everyday Smoker -- 0.3 packs/day for 60 years    Types: Cigarettes  . Smokeless tobacco: Never Used  . Alcohol Use: No    Family History Family History  Problem Relation Age of Onset  . Coronary artery disease    . Heart disease Sister   . Heart disease Sister   . Heart disease Sister     Allergies  Allergen Reactions  . Codeine Nausea And Vomiting    Current Outpatient Prescriptions  Medication Sig Dispense Refill  . amLODipine (NORVASC) 10 MG tablet Take 5 mg by mouth daily.       Marland Kitchen aspirin 81 MG tablet Take 81 mg by mouth daily.        . Calcium-Vitamin D 600-200 MG-UNIT per tablet Take 1 tablet by mouth daily.      . diclofenac (VOLTAREN)  75 MG EC tablet Take 75 mg by mouth 2 (two) times daily.        . ferrous sulfate 325 (65 FE) MG tablet Take 325 mg by mouth daily with breakfast.        . furosemide (LASIX) 20 MG tablet Take 20 mg by mouth daily as needed.        Marland Kitchen glimepiride (AMARYL) 2 MG tablet Take 2 mg by mouth daily before breakfast.        . metFORMIN (GLUCOPHAGE) 500 MG tablet Take 500 mg by mouth 2 (two) times daily with a meal.       . metoprolol succinate (TOPROL-XL) 25 MG 24 hr tablet Take 25 mg by mouth daily.        Marland Kitchen omeprazole (PRILOSEC) 20 MG capsule Take 20 mg by mouth 2 (two) times daily.       . simvastatin (ZOCOR) 20 MG tablet Take 20 mg by mouth at bedtime.        . calcium-vitamin D (OSCAL WITH D) 500-200 MG-UNIT per tablet Take 1 tablet by mouth daily.          Physical Examination  Filed Vitals:   09/17/11 1415   BP: 165/67  Pulse: 67  Resp:     Body mass index is 28.67 kg/(m^2).  General:  WDWN in NAD Gait: Normal HEENT: WNL Eyes: Pupils equal Pulmonary: normal non-labored breathing , without Rales, rhonchi,  wheezing Cardiac: RRR, without  Murmurs, rubs or gallops; Abdomen: soft, NT, no masses Skin: no rashes, ulcers noted  Vascular Exam Pulses: 2+ radial pulses bilaterally Carotid bruits: Carotid pulses to auscultation no bruits are heard Extremities without ischemic changes, no Gangrene , no cellulitis; no open wounds;  Musculoskeletal: no muscle wasting or atrophy   Neurologic: A&O X 3; Appropriate Affect ; SENSATION: normal; MOTOR FUNCTION:  moving all extremities equally. Speech is fluent/normal  Non-Invasive Vascular Imaging CAROTID DUPLEX 09/17/2011  Right ICA 20 - 39 % stenosis Left ICA 0 - 19% stenosis   ASSESSMENT/PLAN: Asymptomatic patient with mild bilateral carotid stenosis with a history of left CEA in 2011. The patient return in one year for repeat carotid duplex, her questions were encouraged and answered, she is in agreement with this plan.  Marie Hunt ANP   Clinic MD: Scot Dock

## 2011-09-17 NOTE — Addendum Note (Signed)
Addended by: Mena Goes on: 09/17/2011 02:27 PM   Modules accepted: Orders

## 2011-09-24 NOTE — Procedures (Unsigned)
CAROTID DUPLEX EXAM  INDICATION:  Carotid endarterectomy  HISTORY: Diabetes:  Yes Cardiac:  No Hypertension:  Yes Smoking:  Yes Previous Surgery:  Left carotid endarterectomy on 08/24/2009 CV History:  Currently asymptomatic Amaurosis Fugax No, Paresthesias No, Hemiparesis No                                      RIGHT             LEFT Brachial systolic pressure:         170               162 Brachial Doppler waveforms:         Normal            Normal Vertebral direction of flow:        Antegrade         Antegrade DUPLEX VELOCITIES (cm/sec) CCA peak systolic                   64                80 ECA peak systolic                   124               [id number] ICA peak systolic                   70                64 ICA end diastolic                   10                18 PLAQUE MORPHOLOGY:                  Calcific PLAQUE AMOUNT:                      Mild/moderate     None PLAQUE LOCATION:                    ICA/ECA  IMPRESSION: 1. Doppler velocity suggests 1%-39% stenosis of the right proximal     internal carotid artery. 2. Patent left carotid endarterectomy site with no left internal     carotid artery stenosis. 3. No significant change noted when compared to the previous exam on     09/12/2010.  ___________________________________________ Judeth Cornfield. Scot Dock, M.D.  CH/MEDQ  D:  09/19/2011  T:  09/19/2011  Job:  [id number]

## 2012-09-22 ENCOUNTER — Other Ambulatory Visit: Payer: Medicare Other

## 2012-09-22 ENCOUNTER — Ambulatory Visit: Payer: Medicare Other | Admitting: Neurosurgery

## 2012-09-23 ENCOUNTER — Encounter: Payer: Self-pay | Admitting: Vascular Surgery

## 2012-09-23 ENCOUNTER — Other Ambulatory Visit (INDEPENDENT_AMBULATORY_CARE_PROVIDER_SITE_OTHER): Payer: Medicare Other | Admitting: *Deleted

## 2012-09-23 DIAGNOSIS — Z48812 Encounter for surgical aftercare following surgery on the circulatory system: Secondary | ICD-10-CM

## 2012-09-23 DIAGNOSIS — I6529 Occlusion and stenosis of unspecified carotid artery: Secondary | ICD-10-CM

## 2012-10-13 ENCOUNTER — Other Ambulatory Visit: Payer: Self-pay | Admitting: *Deleted

## 2012-10-13 DIAGNOSIS — Z48812 Encounter for surgical aftercare following surgery on the circulatory system: Secondary | ICD-10-CM

## 2012-10-14 ENCOUNTER — Encounter: Payer: Self-pay | Admitting: Vascular Surgery

## 2013-04-14 ENCOUNTER — Other Ambulatory Visit: Payer: Self-pay | Admitting: Vascular Surgery

## 2013-04-14 DIAGNOSIS — I6529 Occlusion and stenosis of unspecified carotid artery: Secondary | ICD-10-CM

## 2013-04-14 DIAGNOSIS — Z48812 Encounter for surgical aftercare following surgery on the circulatory system: Secondary | ICD-10-CM

## 2013-09-02 ENCOUNTER — Encounter: Payer: Self-pay | Admitting: Diagnostic Neuroimaging

## 2013-09-14 ENCOUNTER — Encounter: Payer: Medicare Other | Admitting: Radiology

## 2013-09-14 ENCOUNTER — Encounter: Payer: Medicare Other | Admitting: Diagnostic Neuroimaging

## 2013-09-21 ENCOUNTER — Ambulatory Visit (INDEPENDENT_AMBULATORY_CARE_PROVIDER_SITE_OTHER): Payer: Medicare Other | Admitting: Diagnostic Neuroimaging

## 2013-09-21 ENCOUNTER — Ambulatory Visit (INDEPENDENT_AMBULATORY_CARE_PROVIDER_SITE_OTHER): Payer: Self-pay

## 2013-09-21 ENCOUNTER — Encounter: Payer: Medicare Other | Admitting: Diagnostic Neuroimaging

## 2013-09-21 DIAGNOSIS — G5600 Carpal tunnel syndrome, unspecified upper limb: Secondary | ICD-10-CM

## 2013-09-21 DIAGNOSIS — G5603 Carpal tunnel syndrome, bilateral upper limbs: Secondary | ICD-10-CM

## 2013-09-21 DIAGNOSIS — Z0289 Encounter for other administrative examinations: Secondary | ICD-10-CM

## 2013-09-21 DIAGNOSIS — R200 Anesthesia of skin: Secondary | ICD-10-CM

## 2013-09-23 NOTE — Procedures (Signed)
   [facility]  NCS (NERVE CONDUCTION STUDY) WITH EMG (ELECTROMYOGRAPHY) REPORT   STUDY DATE: 09/21/13 PATIENT NAME: Marie Hunt DOB: [birth year] MRN: [id number]  ORDERING CLINICIAN: Anthonette Legato, MD  TECHNOLOGIST: Laretta Alstrom ELECTROMYOGRAPHER: Earlean Polka. Hartwell Vandiver, MD  CLINICAL INFORMATION: 80 year old female with 3-4 months of bilateral hand numbness / pain.  FINDINGS: NERVE CONDUCTION STUDY: Bilateral median motor responses have prolonged distal latencies (right 6.3 ms, left 8.3 ms, normal less than or equal to 4.2 ms), decreased amplitude, normal conduction velocities, normal F-wave latency on the right, prolonged F-wave latency on the left. Bilateral ulnar motor responses and F-wave latencies are normal. Bilateral median sensory responses could not be obtained. Bilateral ulnar sensory responses are normal.  NEEDLE ELECTROMYOGRAPHY: Needle examination of left upper extremity deltoid, biceps, triceps, flexor carpi radialis, first dorsal interosseous is normal.   IMPRESSION:  Abnormal study demonstrating bilateral median neuropathies at the wrists consistent with bilateral carpal tunnel syndrome. This is more severe on the left side.   INTERPRETING PHYSICIAN:  Penni Bombard, MD Certified in Neurology, Neurophysiology and Neuroimaging  [facility] [address], [place], [place] [address] [phone]

## 2013-10-11 ENCOUNTER — Encounter: Payer: Medicare Other | Admitting: Diagnostic Neuroimaging

## 2013-10-12 ENCOUNTER — Other Ambulatory Visit (HOSPITAL_COMMUNITY): Payer: Medicare Other

## 2013-10-12 ENCOUNTER — Ambulatory Visit: Payer: Medicare Other | Admitting: Family

## 2013-10-25 ENCOUNTER — Encounter: Payer: Self-pay | Admitting: Family

## 2013-10-26 ENCOUNTER — Encounter: Payer: Self-pay | Admitting: Family

## 2013-10-26 ENCOUNTER — Ambulatory Visit (HOSPITAL_COMMUNITY)
Admission: RE | Admit: 2013-10-26 | Discharge: 2013-10-26 | Disposition: A | Discharge status: Home / Self Care | Payer: Medicare Other | Source: Ambulatory Visit | Attending: Family | Admitting: Family

## 2013-10-26 ENCOUNTER — Ambulatory Visit (INDEPENDENT_AMBULATORY_CARE_PROVIDER_SITE_OTHER): Payer: Medicare Other | Admitting: Family

## 2013-10-26 VITALS — BP 120/60 | HR 62 | Resp 16 | Ht 64.0 in | Wt 155.0 lb

## 2013-10-26 DIAGNOSIS — Z48812 Encounter for surgical aftercare following surgery on the circulatory system: Secondary | ICD-10-CM

## 2013-10-26 DIAGNOSIS — I6529 Occlusion and stenosis of unspecified carotid artery: Secondary | ICD-10-CM

## 2013-10-26 NOTE — Progress Notes (Signed)
Established Carotid Patient   History of Present Illness  Marie Hunt is a 80 y.o. female patient of Dr. Scot Dock who is status post left CEA in July 2011. She returns today for follow up. Patient denies any history of stroke or TIA, specifically she denies any history of amaurosis fugax or monocular blindness, facial drooping, hemiplegia, or receptive or expressive aphasia.  Pt. denies extremity weakness.  Pt states her PCP is aware of her irregular heart beat. She states she has carpal tunnel syndrome in her left wrist with tingling.  Pt reports New Medical or Surgical History: was hospitalized in July, 2015, for acute kidney failure, she had a UTI. Pt states she is seeing a nephrologist but is much improved.  She also was transfused for severe anemia, is taking iron tabs, denies GI bleeding. She has no claudication symptoms, denies non healing wounds.  Pt Diabetic: Yes Pt smoker: former smoker, quit in 2013  Pt meds include: Statin : Yes ASA: Yes Other anticoagulants/antiplatelets: no   Past Medical History  Diagnosis Date  . GERD (gastroesophageal reflux disease)   . Carotid artery disease     123456 RICA, patent LICA [id number] - Dr. Scot Dock  . Essential hypertension, benign   . Type 2 diabetes mellitus   . Mixed hyperlipidemia   . Sigmoid diverticulosis   . Cholelithiasis   . CVA (cerebral infarction)   . Stroke   . Diabetes mellitus     Type II  . Cholelithiasis     In need of cholecystectomy  . Sigmoid diverticulosis   . GERD (gastroesophageal reflux disease)   . Anemia     Social History History  Substance Use Topics  . Smoking status: Former Smoker -- 0.30 packs/day for 60 years    Types: Cigarettes    Quit date: 10/27/2011  . Smokeless tobacco: Never Used  . Alcohol Use: No    Family History Family History  Problem Relation Age of Onset  . Coronary artery disease    . Heart disease Sister   . Heart disease Sister   . Heart disease Sister   .  Diabetes Mother   . Diabetes Father   . Heart attack Daughter   . Other Daughter     Bleeding problems    Surgical History Past Surgical History  Procedure Laterality Date  . Left carotid endarterectomy  7/11    Dr. Scot Dock  . Carotid endarterectomy  08/24/2009    Left catroid endarterectomy    Allergies  Allergen Reactions  . Codeine Nausea And Vomiting    Current Outpatient Prescriptions  Medication Sig Dispense Refill  . amLODipine (NORVASC) 10 MG tablet Take 5 mg by mouth daily.       Marland Kitchen aspirin 81 MG tablet Take 81 mg by mouth daily.        . B Complex Vitamins (B-COMPLEX/B-12 PO) Take 1,000 mg by mouth daily.      . calcium-vitamin D (OSCAL WITH D) 500-200 MG-UNIT per tablet Take 1 tablet by mouth daily.        . ferrous sulfate 325 (65 FE) MG tablet Take 325 mg by mouth daily with breakfast.        . folic acid (FOLVITE) 1 MG tablet Take 1 mg by mouth daily.      . furosemide (LASIX) 20 MG tablet Take 20 mg by mouth daily as needed.        . metFORMIN (GLUCOPHAGE) 500 MG tablet Take 500 mg by mouth 2 (two)  times daily with a meal.       . metoprolol succinate (TOPROL-XL) 25 MG 24 hr tablet Take 25 mg by mouth daily.        Marland Kitchen omeprazole (PRILOSEC) 20 MG capsule Take 20 mg by mouth 2 (two) times daily.       Glory Rosebush VERIO test strip       . simvastatin (ZOCOR) 20 MG tablet Take 20 mg by mouth at bedtime.        . Calcium-Vitamin D 600-200 MG-UNIT per tablet Take 1 tablet by mouth daily.      . diclofenac (VOLTAREN) 75 MG EC tablet Take 75 mg by mouth 2 (two) times daily.        Marland Kitchen glimepiride (AMARYL) 2 MG tablet Take 2 mg by mouth daily before breakfast.         No current facility-administered medications for this visit.    Review of Systems : See HPI for pertinent positives and negatives.  Physical Examination  Filed Vitals:   10/26/13 0958 10/26/13 1005  BP: 185/72 120/60  Pulse: 62 62  Resp:  16  Height:  5\' 4"  (1.626 m)  Weight:  155 lb (70.308 kg)   SpO2:  100%   Body mass index is 26.59 kg/(m^2).  General: WDWN female in NAD GAIT: normal Eyes: PERRLA Pulmonary:  Non-labored, CTAB, Negative  Rales, Negative rhonchi, & Negative wheezing.  Cardiac: regular Rhythm with frequent premature contractions,  Negative detected murmur.  VASCULAR EXAM Carotid Bruits Right Left   Negative Negative    Aorta is not palpable. Radial pulses: 1+ right, 2+ left. Both brachial pulses are 3+ palpable.                                                                                                                      Gastrointestinal: soft, nontender, BS WNL, no r/g,  negative masses palpated.  Musculoskeletal: Negative muscle atrophy/wasting. M/S 5/5 throughout, Extremities without ischemic changes.  Neurologic: A&O X 3; Appropriate Affect ; SENSATION ;normal;  Speech is normal CN 2-12 intact, Pain and light touch intact in extremities, Motor exam as listed above.   Non-Invasive Vascular Imaging CAROTID DUPLEX 10/26/2013   CEREBROVASCULAR DUPLEX EVALUATION    INDICATION: Carotid artery disease     PREVIOUS INTERVENTION(S): Left carotid endarterectomy 08/24/2009.    DUPLEX EXAM:     RIGHT  LEFT  Peak Systolic Velocities (cm/s) End Diastolic Velocities (cm/s) Plaque LOCATION Peak Systolic Velocities (cm/s) End Diastolic Velocities (cm/s) Plaque  57 9  CCA PROXIMAL 107 9   57 8  CCA MID 58 8   53 8  CCA DISTAL 62 8   96 0 CP ECA 72 0   79 16 CP ICA PROXIMAL 32 6   82 18  ICA MID 63 16   81 16  ICA DISTAL 112 25     1.43 ICA / CCA Ratio (PSV)   Antegrade  Vertebral Flow Antegrade   0000000 Brachial Systolic Pressure (mmHg)  160  Triphasic  Brachial Artery Waveforms Triphasic     Plaque Morphology:  HM = Homogeneous, HT = Heterogeneous, CP = Calcific Plaque, SP = Smooth Plaque, IP = Irregular Plaque     ADDITIONAL FINDINGS:     IMPRESSION: Right internal carotid artery velocities suggest a <40% stenosis.  Patent left carotid  endarterectomy site with no evidence of hyperplasia or restenosis.     Compared to the previous exam:  No significant change in comparison to the last exam on 09/23/2012.    Assessment: Marie Hunt is a 80 y.o. female who is status post left CEA in July 2011.  She presents with asymptomatic minimal right ICA stenosis and patent left carotid endarterectomy site. No significant change in comparison to the last exam on 09/23/2012.  Plan: Follow-up in 1 year with Carotid Duplex scan.   I discussed in depth with the patient the nature of atherosclerosis, and emphasized the importance of maximal medical management including strict control of blood pressure, blood glucose, and lipid levels, obtaining regular exercise, and continued cessation of smoking.  The patient is aware that without maximal medical management the underlying atherosclerotic disease process will progress, limiting the benefit of any interventions. The patient was given information about stroke prevention and what symptoms should prompt the patient to seek immediate medical care. Thank you for allowing [place] to participate in this patient's care.  Clemon Chambers, RN, MSN, FNP-C [facility] [place] Office: [phone]  Clinic Physician: Scot Dock  10/26/2013 9:57 AM

## 2013-10-26 NOTE — Patient Instructions (Signed)
Stroke Prevention Some medical conditions and behaviors are associated with an increased chance of having a stroke. You may prevent a stroke by making healthy choices and managing medical conditions. HOW CAN I REDUCE MY RISK OF HAVING A STROKE?   Stay physically active. Get at least 30 minutes of activity on most or all days.  Do not smoke. It may also be helpful to avoid exposure to secondhand smoke.  Limit alcohol use. Moderate alcohol use is considered to be:  No more than 2 drinks per day for men.  No more than 1 drink per day for nonpregnant women.  Eat healthy foods. This involves:  Eating 5 or more servings of fruits and vegetables a day.  Making dietary changes that address high blood pressure (hypertension), high cholesterol, diabetes, or obesity.  Manage your cholesterol levels.  Making food choices that are high in fiber and low in saturated fat, trans fat, and cholesterol may control cholesterol levels.  Take any prescribed medicines to control cholesterol as directed by your health care provider.  Manage your diabetes.  Controlling your carbohydrate and sugar intake is recommended to manage diabetes.  Take any prescribed medicines to control diabetes as directed by your health care provider.  Control your hypertension.  Making food choices that are low in salt (sodium), saturated fat, trans fat, and cholesterol is recommended to manage hypertension.  Take any prescribed medicines to control hypertension as directed by your health care provider.  Maintain a healthy weight.  Reducing calorie intake and making food choices that are low in sodium, saturated fat, trans fat, and cholesterol are recommended to manage weight.  Stop drug abuse.  Avoid taking birth control pills.  Talk to your health care provider about the risks of taking birth control pills if you are over 35 years old, smoke, get migraines, or have ever had a blood clot.  Get evaluated for sleep  disorders (sleep apnea).  Talk to your health care provider about getting a sleep evaluation if you snore a lot or have excessive sleepiness.  Take medicines only as directed by your health care provider.  For some people, aspirin or blood thinners (anticoagulants) are helpful in reducing the risk of forming abnormal blood clots that can lead to stroke. If you have the irregular heart rhythm of atrial fibrillation, you should be on a blood thinner unless there is a good reason you cannot take them.  Understand all your medicine instructions.  Make sure that other conditions (such as anemia or atherosclerosis) are addressed. SEEK IMMEDIATE MEDICAL CARE IF:   You have sudden weakness or numbness of the face, arm, or leg, especially on one side of the body.  Your face or eyelid droops to one side.  You have sudden confusion.  You have trouble speaking (aphasia) or understanding.  You have sudden trouble seeing in one or both eyes.  You have sudden trouble walking.  You have dizziness.  You have a loss of balance or coordination.  You have a sudden, severe headache with no known cause.  You have new chest pain or an irregular heartbeat. Any of these symptoms may represent a serious problem that is an emergency. Do not wait to see if the symptoms will go away. Get medical help at once. Call your local emergency services (911 in [place] Do not drive yourself to the hospital. Document Released: 03/20/2004 Document Revised: 06/27/2013 Document Reviewed: 08/13/2012 [employer] Patient Information 2015 [employer]. This information is not intended to replace advice given   to you by your health care provider. Make sure you discuss any questions you have with your health care provider.  

## 2014-08-15 ENCOUNTER — Encounter (HOSPITAL_COMMUNITY)
Admission: RE | Admit: 2014-08-15 | Discharge: 2014-08-15 | Disposition: A | Discharge status: Home / Self Care | Payer: Medicare Other | Source: Ambulatory Visit | Attending: Ophthalmology | Admitting: Ophthalmology

## 2014-08-15 ENCOUNTER — Encounter (HOSPITAL_COMMUNITY): Payer: Self-pay

## 2014-08-15 DIAGNOSIS — H25812 Combined forms of age-related cataract, left eye: Secondary | ICD-10-CM | POA: Diagnosis not present

## 2014-08-15 DIAGNOSIS — Z7982 Long term (current) use of aspirin: Secondary | ICD-10-CM | POA: Diagnosis not present

## 2014-08-15 DIAGNOSIS — I739 Peripheral vascular disease, unspecified: Secondary | ICD-10-CM | POA: Diagnosis not present

## 2014-08-15 DIAGNOSIS — H269 Unspecified cataract: Secondary | ICD-10-CM | POA: Diagnosis present

## 2014-08-15 DIAGNOSIS — E780 Pure hypercholesterolemia: Secondary | ICD-10-CM | POA: Diagnosis not present

## 2014-08-15 DIAGNOSIS — E119 Type 2 diabetes mellitus without complications: Secondary | ICD-10-CM | POA: Diagnosis not present

## 2014-08-15 DIAGNOSIS — I10 Essential (primary) hypertension: Secondary | ICD-10-CM | POA: Diagnosis not present

## 2014-08-15 DIAGNOSIS — Z79899 Other long term (current) drug therapy: Secondary | ICD-10-CM | POA: Diagnosis not present

## 2014-08-15 DIAGNOSIS — Z87891 Personal history of nicotine dependence: Secondary | ICD-10-CM | POA: Diagnosis not present

## 2014-08-15 DIAGNOSIS — Z8673 Personal history of transient ischemic attack (TIA), and cerebral infarction without residual deficits: Secondary | ICD-10-CM | POA: Diagnosis not present

## 2014-08-15 DIAGNOSIS — K219 Gastro-esophageal reflux disease without esophagitis: Secondary | ICD-10-CM | POA: Diagnosis not present

## 2014-08-15 DIAGNOSIS — D649 Anemia, unspecified: Secondary | ICD-10-CM | POA: Diagnosis not present

## 2014-08-15 DIAGNOSIS — M1990 Unspecified osteoarthritis, unspecified site: Secondary | ICD-10-CM | POA: Diagnosis not present

## 2014-08-15 LAB — CBC
HCT: 34.3 % — ABNORMAL LOW (ref 36.0–46.0)
Hemoglobin: 11 g/dL — ABNORMAL LOW (ref 12.0–15.0)
MCH: 29.9 pg (ref 26.0–34.0)
MCHC: 32.1 g/dL (ref 30.0–36.0)
MCV: 93.2 fL (ref 78.0–100.0)
PLATELETS: 209 10*3/uL (ref 150–400)
RBC: 3.68 MIL/uL — AB (ref 3.87–5.11)
RDW: 13.5 % (ref 11.5–15.5)
WBC: 7.3 10*3/uL (ref 4.0–10.5)

## 2014-08-15 LAB — BASIC METABOLIC PANEL
ANION GAP: 11 (ref 5–15)
BUN: 29 mg/dL — ABNORMAL HIGH (ref 6–20)
CHLORIDE: 101 mmol/L (ref 101–111)
CO2: 29 mmol/L (ref 22–32)
CREATININE: 2.05 mg/dL — AB (ref 0.44–1.00)
Calcium: 9.8 mg/dL (ref 8.9–10.3)
GFR calc non Af Amer: 22 mL/min — ABNORMAL LOW (ref >60)
GFR, EST AFRICAN AMERICAN: 25 mL/min — AB (ref >60)
Glucose, Bld: 119 mg/dL — ABNORMAL HIGH (ref 65–99)
POTASSIUM: 4.4 mmol/L (ref 3.5–5.1)
Sodium: 141 mmol/L (ref 135–145)

## 2014-08-15 NOTE — Progress Notes (Signed)
Notified Dr. Duwayne Heck about patient's blood pressure reading. To recheck when patient comes for surgery. Patient to take blood pressure medications before coming for surgery. No furthers orders at this time.

## 2014-08-15 NOTE — Patient Instructions (Signed)
Marie Hunt     Your procedure is scheduled on Thursday, August 17, 2014  Report to [name] at 1215 PM  Call this number if you have problems the morning of surgery:  [phone]   Remember:  Do not eat food or drink liquids after midnight.  Take these medicines the morning of surgery with A SIP OF WATER    Do not wear jewelry, make-up or nail polish.  Do not wear lotions, powders, or perfumes.  You may wear deodorant.  Do not shave 48 hours prior to surgery.  Men may shave face and neck.  Do not bring valuables to the hospital.  [facility] is not responsible for any belongings or valuables.  Contacts, dentures or bridgework may not be worn into surgery.  Leave your suitcase in the car.  After surgery it may be brought to your room.  For patients admitted to the hospital, discharge time will be determined by your treatment team.  Patients discharged the day of surgery will not be allowed to drive home.   Name and phone number of your driver:   Family Special instructions:    Please read over the following fact sheets that you were given. Pain Booklet, Coughing and Deep Breathing, MRSA Information, Anesthesia Post-op Instructions and Care and Recovery After Surgery      Cataract Surgery  A cataract is a clouding of the lens of the eye. When a lens becomes cloudy, vision is reduced based on the degree and nature of the clouding. Surgery may be needed to improve vision. Surgery removes the cloudy lens and usually replaces it with a substitute lens (intraocular lens, IOL). LET YOUR EYE DOCTOR KNOW ABOUT:  Allergies to food or medicine.  Medicines taken including herbs, eye drops, over-the-counter medicines, and creams.  Use of steroids (by mouth or creams).  Previous problems with anesthetics or numbing medicine.  History of bleeding problems or blood clots.  Previous surgery.  Other health problems, including diabetes and kidney problems.  Possibility of  pregnancy, if this applies. RISKS AND COMPLICATIONS  Infection.  Inflammation of the eyeball (endophthalmitis) that can spread to both eyes (sympathetic ophthalmia).  Poor wound healing.  If an IOL is inserted, it can later fall out of proper position. This is very uncommon.  Clouding of the part of your eye that holds an IOL in place. This is called an "after-cataract." These are uncommon but easily treated. BEFORE THE PROCEDURE  Do not eat or drink anything except small amounts of water for 8 to 12 before your surgery, or as directed by your caregiver.  Unless you are told otherwise, continue any eye drops you have been prescribed.  Talk to your primary caregiver about all other medicines that you take (both prescription and nonprescription). In some cases, you may need to stop or change medicines near the time of your surgery. This is most important if you are taking blood-thinning medicine.Do not stop medicines unless you are told to do so.  Arrange for someone to drive you to and from the procedure.  Do not put contact lenses in either eye on the day of your surgery. PROCEDURE There is more than one method for safely removing a cataract. Your doctor can explain the differences and help determine which is best for you. Phacoemulsification surgery is the most common form of cataract surgery.  An injection is given behind the eye or eye drops are given to make this a painless procedure.  A  small cut (incision) is made on the edge of the clear, dome-shaped surface that covers the front of the eye (cornea).  A tiny probe is painlessly inserted into the eye. This device gives off ultrasound waves that soften and break up the cloudy center of the lens. This makes it easier for the cloudy lens to be removed by suction.  An IOL may be implanted.  The normal lens of the eye is covered by a clear capsule. Part of that capsule is intentionally left in the eye to support the IOL.  Your  surgeon may or may not use stitches to close the incision. There are other forms of cataract surgery that require a larger incision and stitches to close the eye. This approach is taken in cases where the doctor feels that the cataract cannot be easily removed using phacoemulsification. AFTER THE PROCEDURE  When an IOL is implanted, it does not need care. It becomes a permanent part of your eye and cannot be seen or felt.  Your doctor will schedule follow-up exams to check on your progress.  Review your other medicines with your doctor to see which can be resumed after surgery.  Use eye drops or take medicine as prescribed by your doctor. Document Released: 01/30/2011 Document Revised: 06/27/2013 Document Reviewed: 01/30/2011 [facility] Patient Information 2015 [place], [place]. This information is not intended to replace advice given to you by your health care provider. Make sure you discuss any questions you have with your health care provider.  PATIENT INSTRUCTIONS POST-ANESTHESIA  IMMEDIATELY FOLLOWING SURGERY:  Do not drive or operate machinery for the first twenty four hours after surgery.  Do not make any important decisions for twenty four hours after surgery or while taking narcotic pain medications or sedatives.  If you develop intractable nausea and vomiting or a severe headache please notify your doctor immediately.  FOLLOW-UP:  Please make an appointment with your surgeon as instructed. You do not need to follow up with anesthesia unless specifically instructed to do so.  WOUND CARE INSTRUCTIONS (if applicable):  Keep a dry clean dressing on the anesthesia/puncture wound site if there is drainage.  Once the wound has quit draining you may leave it open to air.  Generally you should leave the bandage intact for twenty four hours unless there is drainage.  If the epidural site drains for more than 36-48 hours please call the anesthesia department.  QUESTIONS?:  Please feel free to call  your physician or the hospital operator if you have any questions, and they will be happy to assist you.

## 2014-08-16 MED ORDER — LIDOCAINE HCL (PF) 1 % IJ SOLN
INTRAMUSCULAR | Status: AC
  Filled 2014-08-16: qty 2

## 2014-08-16 MED ORDER — LIDOCAINE HCL 3.5 % OP GEL
OPHTHALMIC | Status: AC
  Filled 2014-08-16: qty 1

## 2014-08-16 MED ORDER — TETRACAINE HCL 0.5 % OP SOLN
OPHTHALMIC | Status: AC
  Filled 2014-08-16: qty 2

## 2014-08-16 MED ORDER — PHENYLEPHRINE HCL 2.5 % OP SOLN
OPHTHALMIC | Status: AC
  Filled 2014-08-16: qty 15

## 2014-08-16 MED ORDER — CYCLOPENTOLATE-PHENYLEPHRINE OP SOLN OPTIME - NO CHARGE
OPHTHALMIC | Status: AC
  Filled 2014-08-16: qty 2

## 2014-08-16 MED ORDER — NEOMYCIN-POLYMYXIN-DEXAMETH 3.5-10000-0.1 OP SUSP
OPHTHALMIC | Status: AC
  Filled 2014-08-16: qty 5

## 2014-08-17 ENCOUNTER — Encounter (HOSPITAL_COMMUNITY)
Admission: RE | Disposition: A | Discharge status: Home / Self Care | Payer: Self-pay | Source: Ambulatory Visit | Attending: Ophthalmology

## 2014-08-17 ENCOUNTER — Ambulatory Visit (HOSPITAL_COMMUNITY): Payer: Medicare Other | Admitting: Anesthesiology

## 2014-08-17 ENCOUNTER — Encounter (HOSPITAL_COMMUNITY): Payer: Self-pay | Admitting: Emergency Medicine

## 2014-08-17 ENCOUNTER — Ambulatory Visit (HOSPITAL_COMMUNITY)
Admission: RE | Admit: 2014-08-17 | Discharge: 2014-08-17 | Disposition: A | Discharge status: Home / Self Care | Payer: Medicare Other | Source: Ambulatory Visit | Attending: Ophthalmology | Admitting: Ophthalmology

## 2014-08-17 DIAGNOSIS — I10 Essential (primary) hypertension: Secondary | ICD-10-CM | POA: Diagnosis not present

## 2014-08-17 DIAGNOSIS — E780 Pure hypercholesterolemia: Secondary | ICD-10-CM | POA: Insufficient documentation

## 2014-08-17 DIAGNOSIS — E119 Type 2 diabetes mellitus without complications: Secondary | ICD-10-CM | POA: Diagnosis not present

## 2014-08-17 DIAGNOSIS — H25812 Combined forms of age-related cataract, left eye: Secondary | ICD-10-CM | POA: Diagnosis not present

## 2014-08-17 DIAGNOSIS — Z8673 Personal history of transient ischemic attack (TIA), and cerebral infarction without residual deficits: Secondary | ICD-10-CM | POA: Insufficient documentation

## 2014-08-17 DIAGNOSIS — I739 Peripheral vascular disease, unspecified: Secondary | ICD-10-CM | POA: Insufficient documentation

## 2014-08-17 DIAGNOSIS — K219 Gastro-esophageal reflux disease without esophagitis: Secondary | ICD-10-CM | POA: Insufficient documentation

## 2014-08-17 DIAGNOSIS — Z79899 Other long term (current) drug therapy: Secondary | ICD-10-CM | POA: Insufficient documentation

## 2014-08-17 DIAGNOSIS — D649 Anemia, unspecified: Secondary | ICD-10-CM | POA: Diagnosis not present

## 2014-08-17 DIAGNOSIS — Z7982 Long term (current) use of aspirin: Secondary | ICD-10-CM | POA: Insufficient documentation

## 2014-08-17 DIAGNOSIS — M1990 Unspecified osteoarthritis, unspecified site: Secondary | ICD-10-CM | POA: Insufficient documentation

## 2014-08-17 DIAGNOSIS — Z87891 Personal history of nicotine dependence: Secondary | ICD-10-CM | POA: Insufficient documentation

## 2014-08-17 HISTORY — PX: CATARACT EXTRACTION W/PHACO: SHX586

## 2014-08-17 LAB — GLUCOSE, CAPILLARY: Glucose-Capillary: 100 mg/dL — ABNORMAL HIGH (ref 65–99)

## 2014-08-17 SURGERY — PHACOEMULSIFICATION, CATARACT, WITH IOL INSERTION
Anesthesia: Monitor Anesthesia Care | Site: Eye | Laterality: Left

## 2014-08-17 MED ORDER — FENTANYL CITRATE (PF) 100 MCG/2ML IJ SOLN
INTRAMUSCULAR | Status: DC | PRN
  Administered 2014-08-17: 25 ug via INTRAVENOUS

## 2014-08-17 MED ORDER — LIDOCAINE HCL (PF) 1 % IJ SOLN
INTRAOCULAR | Status: DC | PRN
  Administered 2014-08-17: 1 mL via OPHTHALMIC

## 2014-08-17 MED ORDER — TETRACAINE HCL 0.5 % OP SOLN
1.0000 [drp] | OPHTHALMIC | Status: AC
  Administered 2014-08-17 (×3): 1 [drp] via OPHTHALMIC

## 2014-08-17 MED ORDER — CYCLOPENTOLATE-PHENYLEPHRINE 0.2-1 % OP SOLN
1.0000 [drp] | OPHTHALMIC | Status: AC
  Administered 2014-08-17 (×3): 1 [drp] via OPHTHALMIC

## 2014-08-17 MED ORDER — MIDAZOLAM HCL 2 MG/2ML IJ SOLN
1.0000 mg | INTRAMUSCULAR | Status: DC | PRN
  Administered 2014-08-17: 2 mg via INTRAVENOUS

## 2014-08-17 MED ORDER — FENTANYL CITRATE (PF) 100 MCG/2ML IJ SOLN
INTRAMUSCULAR | Status: AC
  Filled 2014-08-17: qty 2

## 2014-08-17 MED ORDER — PHENYLEPHRINE HCL 2.5 % OP SOLN
1.0000 [drp] | OPHTHALMIC | Status: AC
Start: 2014-08-17 — End: 2014-08-17
  Administered 2014-08-17 (×3): 1 [drp] via OPHTHALMIC

## 2014-08-17 MED ORDER — FENTANYL CITRATE (PF) 100 MCG/2ML IJ SOLN: 25.0000 ug | INTRAMUSCULAR | Status: DC | PRN

## 2014-08-17 MED ORDER — POVIDONE-IODINE 5 % OP SOLN
OPHTHALMIC | Status: DC | PRN
  Administered 2014-08-17: 1 via OPHTHALMIC

## 2014-08-17 MED ORDER — ONDANSETRON HCL 4 MG/2ML IJ SOLN: 4.0000 mg | Freq: Once | INTRAMUSCULAR | Status: DC | PRN

## 2014-08-17 MED ORDER — LIDOCAINE 3.5 % OP GEL OPTIME - NO CHARGE
OPHTHALMIC | Status: DC | PRN
  Administered 2014-08-17: 1 [drp] via OPHTHALMIC

## 2014-08-17 MED ORDER — MIDAZOLAM HCL 2 MG/2ML IJ SOLN
INTRAMUSCULAR | Status: AC
  Filled 2014-08-17: qty 2

## 2014-08-17 MED ORDER — BSS IO SOLN
INTRAOCULAR | Status: DC | PRN
  Administered 2014-08-17: 15 mL via INTRAOCULAR

## 2014-08-17 MED ORDER — FENTANYL CITRATE (PF) 100 MCG/2ML IJ SOLN
25.0000 ug | Freq: Once | INTRAMUSCULAR | Status: AC
  Administered 2014-08-17: 25 ug via INTRAVENOUS

## 2014-08-17 MED ORDER — LIDOCAINE HCL 3.5 % OP GEL
1.0000 "application " | Freq: Once | OPHTHALMIC | Status: AC
  Administered 2014-08-17: 1 via OPHTHALMIC

## 2014-08-17 MED ORDER — EPINEPHRINE HCL 1 MG/ML IJ SOLN
INTRAOCULAR | Status: DC | PRN
  Administered 2014-08-17: 500 mL

## 2014-08-17 MED ORDER — NEOMYCIN-POLYMYXIN-DEXAMETH 3.5-10000-0.1 OP SUSP
OPHTHALMIC | Status: DC | PRN
  Administered 2014-08-17: 1 [drp] via OPHTHALMIC

## 2014-08-17 MED ORDER — LACTATED RINGERS IV SOLN
INTRAVENOUS | Status: DC
  Administered 2014-08-17: 75 mL/h via INTRAVENOUS

## 2014-08-17 MED ORDER — PROVISC 10 MG/ML IO SOLN
INTRAOCULAR | Status: DC | PRN
  Administered 2014-08-17: 0.85 mL via INTRAOCULAR

## 2014-08-17 SURGICAL SUPPLY — 34 items
CAPSULAR TENSION RING-AMO (OPHTHALMIC RELATED) IMPLANT
CLOTH BEACON ORANGE TIMEOUT ST (SAFETY) ×2 IMPLANT
EYE SHIELD UNIVERSAL CLEAR (GAUZE/BANDAGES/DRESSINGS) ×2 IMPLANT
GLOVE BIO SURGEON STRL SZ 6.5 (GLOVE) IMPLANT
GLOVE BIO SURGEONS STRL SZ 6.5 (GLOVE)
GLOVE BIOGEL PI IND STRL 6.5 (GLOVE) IMPLANT
GLOVE BIOGEL PI IND STRL 7.0 (GLOVE) IMPLANT
GLOVE BIOGEL PI IND STRL 7.5 (GLOVE) IMPLANT
GLOVE BIOGEL PI INDICATOR 6.5 (GLOVE) ×2
GLOVE BIOGEL PI INDICATOR 7.0 (GLOVE)
GLOVE BIOGEL PI INDICATOR 7.5 (GLOVE)
GLOVE ECLIPSE 6.5 STRL STRAW (GLOVE) IMPLANT
GLOVE ECLIPSE 7.0 STRL STRAW (GLOVE) IMPLANT
GLOVE ECLIPSE 7.5 STRL STRAW (GLOVE) IMPLANT
GLOVE EXAM NITRILE LRG STRL (GLOVE) IMPLANT
GLOVE EXAM NITRILE MD LF STRL (GLOVE) ×2 IMPLANT
GLOVE SKINSENSE NS SZ6.5 (GLOVE)
GLOVE SKINSENSE NS SZ7.0 (GLOVE)
GLOVE SKINSENSE STRL SZ6.5 (GLOVE) IMPLANT
GLOVE SKINSENSE STRL SZ7.0 (GLOVE) IMPLANT
KIT VITRECTOMY (OPHTHALMIC RELATED) IMPLANT
PAD ARMBOARD 7.5X6 YLW CONV (MISCELLANEOUS) ×2 IMPLANT
PROC W NO LENS (INTRAOCULAR LENS)
PROC W SPEC LENS (INTRAOCULAR LENS)
PROCESS W NO LENS (INTRAOCULAR LENS) IMPLANT
PROCESS W SPEC LENS (INTRAOCULAR LENS) IMPLANT
RETRACTOR IRIS SIGHTPATH (OPHTHALMIC RELATED) IMPLANT
RING MALYGIN (MISCELLANEOUS) IMPLANT
SIGHTPATH CAT PROC W REG LENS (Ophthalmic Related) ×3 IMPLANT
SYRINGE LUER LOK 1CC (MISCELLANEOUS) ×2 IMPLANT
TAPE SURG TRANSPORE 1 IN (GAUZE/BANDAGES/DRESSINGS) IMPLANT
TAPE SURGICAL TRANSPORE 1 IN (GAUZE/BANDAGES/DRESSINGS) ×2
VISCOELASTIC ADDITIONAL (OPHTHALMIC RELATED) IMPLANT
WATER STERILE IRR 250ML POUR (IV SOLUTION) ×2 IMPLANT

## 2014-08-17 NOTE — H&P (Signed)
I have reviewed the H&P, the patient was re-examined, and I have identified no interval changes in medical condition and plan of care since the history and physical of record  

## 2014-08-17 NOTE — Anesthesia Procedure Notes (Signed)
Procedure Name: MAC Date/Time: 08/17/2014 12:29 PM Performed by: Vista Deck Pre-anesthesia Checklist: Patient identified, Emergency Drugs available, Suction available, Timeout performed and Patient being monitored Patient Re-evaluated:Patient Re-evaluated prior to inductionOxygen Delivery Method: Nasal Cannula

## 2014-08-17 NOTE — Anesthesia Postprocedure Evaluation (Signed)
  Anesthesia Post-op Note  Patient: Marie Hunt  Procedure(s) Performed: Procedure(s) (LRB): CATARACT EXTRACTION PHACO AND INTRAOCULAR LENS PLACEMENT; CDE:  7.39 (Left)  Patient Location:  Short Stay  Anesthesia Type: MAC  Level of Consciousness: awake  Airway and Oxygen Therapy: Patient Spontanous Breathing  Post-op Pain: none  Post-op Assessment: Post-op Vital signs reviewed, Patient's Cardiovascular Status Stable, Respiratory Function Stable, Patent Airway, No signs of Nausea or vomiting and Pain level controlled  Post-op Vital Signs: Reviewed and stable  Complications: No apparent anesthesia complications

## 2014-08-17 NOTE — Op Note (Signed)
Date of Admission: 08/17/2014  Date of Surgery: 08/17/2014   Pre-Op Dx: Cataract Left Eye  Post-Op Dx: Senile Combined Cataract Left  Eye,  Dx Code [id number]  Surgeon: Tonny Branch, M.D.  Assistants: None  Anesthesia: Topical with MAC  Indications: Painless, progressive loss of vision with compromise of daily activities.  Surgery: Cataract Extraction with Intraocular lens Implant Left Eye  Discription: The patient had dilating drops and viscous lidocaine placed into the Left eye in the pre-op holding area. After transfer to the operating room, a time out was performed. The patient was then prepped and draped. Beginning with a 85 degree blade a paracentesis port was made at the surgeon's 2 o'clock position. The anterior chamber was then filled with 1% non-preserved lidocaine. This was followed by filling the anterior chamber with Provisc.  A 2.86mm keratome blade was used to make a clear corneal incision at the temporal limbus.  A bent cystatome needle was used to create a continuous tear capsulotomy. Hydrodissection was performed with balanced salt solution on a Fine canula. The lens nucleus was then removed using the phacoemulsification handpiece. Residual cortex was removed with the I&A handpiece. The anterior chamber and capsular bag were refilled with Provisc. A posterior chamber intraocular lens was placed into the capsular bag with it's injector. The implant was positioned with the Kuglan hook. The Provisc was then removed from the anterior chamber and capsular bag with the I&A handpiece. Stromal hydration of the main incision and paracentesis port was performed with BSS on a Fine canula. The wounds were tested for leak which was negative. The patient tolerated the procedure well. There were no operative complications. The patient was then transferred to the recovery room in stable condition.  Complications: None  Specimen: None  EBL: None  Prosthetic device: Hoya iSert 250, power 23.0 D, SN  [id number].

## 2014-08-17 NOTE — Anesthesia Preprocedure Evaluation (Addendum)
Anesthesia Evaluation  Patient identified by MRN, date of birth, ID band Patient awake    Reviewed: Allergy & Precautions, NPO status , Patient's Chart, lab work & pertinent test results, reviewed documented beta blocker date and time   Airway Mallampati: II  TM Distance: >3 FB     Dental  (+) Edentulous Upper, Edentulous Lower   Pulmonary former smoker,  breath sounds clear to auscultation        Cardiovascular hypertension, Pt. on medications and Pt. on home beta blockers + Peripheral Vascular Disease Rhythm:Regular Rate:Normal     Neuro/Psych CVA    GI/Hepatic GERD-  ,  Endo/Other  diabetes, Type 2, Oral Hypoglycemic Agents  Renal/GU      Musculoskeletal   Abdominal   Peds  Hematology   Anesthesia Other Findings   Reproductive/Obstetrics                            Anesthesia Physical Anesthesia Plan  ASA: III  Anesthesia Plan: MAC   Post-op Pain Management:    Induction: Intravenous  Airway Management Planned: Nasal Cannula  Additional Equipment:   Intra-op Plan:   Post-operative Plan:   Informed Consent: I have reviewed the patients History and Physical, chart, labs and discussed the procedure including the risks, benefits and alternatives for the proposed anesthesia with the patient or authorized representative who has indicated his/her understanding and acceptance.     Plan Discussed with:   Anesthesia Plan Comments:         Anesthesia Quick Evaluation

## 2014-08-17 NOTE — Transfer of Care (Signed)
Immediate Anesthesia Transfer of Care Note  Patient: Marie Hunt  Procedure(s) Performed: Procedure(s) (LRB): CATARACT EXTRACTION PHACO AND INTRAOCULAR LENS PLACEMENT; CDE:  7.39 (Left)  Patient Location: Shortstay  Anesthesia Type: MAC  Level of Consciousness: awake  Airway & Oxygen Therapy: Patient Spontanous Breathing   Post-op Assessment: Report given to PACU RN, Post -op Vital signs reviewed and stable and Patient moving all extremities  Post vital signs: Reviewed and stable  Complications: No apparent anesthesia complications

## 2014-08-17 NOTE — Discharge Instructions (Signed)

## 2014-08-21 ENCOUNTER — Encounter (HOSPITAL_COMMUNITY): Payer: Self-pay | Admitting: Ophthalmology

## 2014-09-26 ENCOUNTER — Encounter (HOSPITAL_COMMUNITY)
Admission: RE | Admit: 2014-09-26 | Discharge: 2014-09-26 | Disposition: A | Discharge status: Home / Self Care | Payer: Medicare Other | Source: Ambulatory Visit | Attending: Ophthalmology | Admitting: Ophthalmology

## 2014-09-26 NOTE — Pre-Procedure Instructions (Signed)
Second attempt made to contact patient for phone interview.  No answer and was not able to leave message.

## 2014-09-29 MED ORDER — NEOMYCIN-POLYMYXIN-DEXAMETH 3.5-10000-0.1 OP SUSP
OPHTHALMIC | Status: AC
  Filled 2014-09-29: qty 5

## 2014-09-29 MED ORDER — LIDOCAINE HCL 3.5 % OP GEL
OPHTHALMIC | Status: AC
  Filled 2014-09-29: qty 1

## 2014-09-29 MED ORDER — LIDOCAINE HCL (PF) 1 % IJ SOLN
INTRAMUSCULAR | Status: AC
  Filled 2014-09-29: qty 2

## 2014-09-29 MED ORDER — PHENYLEPHRINE HCL 2.5 % OP SOLN
OPHTHALMIC | Status: AC
  Filled 2014-09-29: qty 15

## 2014-09-29 MED ORDER — CYCLOPENTOLATE-PHENYLEPHRINE OP SOLN OPTIME - NO CHARGE
OPHTHALMIC | Status: AC
  Filled 2014-09-29: qty 2

## 2014-09-29 MED ORDER — TETRACAINE HCL 0.5 % OP SOLN
OPHTHALMIC | Status: AC
  Filled 2014-09-29: qty 2

## 2014-10-02 ENCOUNTER — Inpatient Hospital Stay (HOSPITAL_COMMUNITY)
Admission: EM | Admit: 2014-10-02 | Discharge: 2014-10-07 | DRG: 470 | Disposition: A | Discharge status: Skilled Nursing Facility | Payer: Medicare Other | Attending: Internal Medicine | Admitting: Internal Medicine

## 2014-10-02 ENCOUNTER — Encounter (HOSPITAL_COMMUNITY): Payer: Self-pay | Admitting: Ophthalmology

## 2014-10-02 ENCOUNTER — Encounter (HOSPITAL_COMMUNITY): Payer: Self-pay | Admitting: Emergency Medicine

## 2014-10-02 ENCOUNTER — Ambulatory Visit (HOSPITAL_COMMUNITY): Payer: Medicare Other | Admitting: Anesthesiology

## 2014-10-02 ENCOUNTER — Emergency Department (HOSPITAL_COMMUNITY): Payer: Medicare Other

## 2014-10-02 ENCOUNTER — Ambulatory Visit (HOSPITAL_COMMUNITY)
Admission: RE | Admit: 2014-10-02 | Discharge: 2014-10-02 | Disposition: A | Discharge status: Home / Self Care | Payer: Medicare Other | Source: Ambulatory Visit | Attending: Ophthalmology | Admitting: Ophthalmology

## 2014-10-02 ENCOUNTER — Encounter (HOSPITAL_COMMUNITY)
Admission: RE | Disposition: A | Discharge status: Home / Self Care | Payer: Self-pay | Source: Ambulatory Visit | Attending: Ophthalmology

## 2014-10-02 DIAGNOSIS — E782 Mixed hyperlipidemia: Secondary | ICD-10-CM | POA: Diagnosis present

## 2014-10-02 DIAGNOSIS — Z833 Family history of diabetes mellitus: Secondary | ICD-10-CM | POA: Diagnosis not present

## 2014-10-02 DIAGNOSIS — Z79899 Other long term (current) drug therapy: Secondary | ICD-10-CM | POA: Insufficient documentation

## 2014-10-02 DIAGNOSIS — I2510 Atherosclerotic heart disease of native coronary artery without angina pectoris: Secondary | ICD-10-CM | POA: Diagnosis present

## 2014-10-02 DIAGNOSIS — Z87891 Personal history of nicotine dependence: Secondary | ICD-10-CM | POA: Diagnosis not present

## 2014-10-02 DIAGNOSIS — K219 Gastro-esophageal reflux disease without esophagitis: Secondary | ICD-10-CM

## 2014-10-02 DIAGNOSIS — E1122 Type 2 diabetes mellitus with diabetic chronic kidney disease: Secondary | ICD-10-CM | POA: Diagnosis present

## 2014-10-02 DIAGNOSIS — R509 Fever, unspecified: Secondary | ICD-10-CM | POA: Diagnosis not present

## 2014-10-02 DIAGNOSIS — S72001D Fracture of unspecified part of neck of right femur, subsequent encounter for closed fracture with routine healing: Secondary | ICD-10-CM | POA: Diagnosis not present

## 2014-10-02 DIAGNOSIS — Z01818 Encounter for other preprocedural examination: Secondary | ICD-10-CM | POA: Diagnosis not present

## 2014-10-02 DIAGNOSIS — I129 Hypertensive chronic kidney disease with stage 1 through stage 4 chronic kidney disease, or unspecified chronic kidney disease: Secondary | ICD-10-CM | POA: Diagnosis present

## 2014-10-02 DIAGNOSIS — Z8249 Family history of ischemic heart disease and other diseases of the circulatory system: Secondary | ICD-10-CM | POA: Diagnosis not present

## 2014-10-02 DIAGNOSIS — Z791 Long term (current) use of non-steroidal anti-inflammatories (NSAID): Secondary | ICD-10-CM | POA: Insufficient documentation

## 2014-10-02 DIAGNOSIS — N184 Chronic kidney disease, stage 4 (severe): Secondary | ICD-10-CM | POA: Diagnosis present

## 2014-10-02 DIAGNOSIS — I779 Disorder of arteries and arterioles, unspecified: Secondary | ICD-10-CM | POA: Diagnosis present

## 2014-10-02 DIAGNOSIS — W010XXA Fall on same level from slipping, tripping and stumbling without subsequent striking against object, initial encounter: Secondary | ICD-10-CM | POA: Diagnosis present

## 2014-10-02 DIAGNOSIS — D649 Anemia, unspecified: Secondary | ICD-10-CM | POA: Diagnosis present

## 2014-10-02 DIAGNOSIS — N390 Urinary tract infection, site not specified: Secondary | ICD-10-CM | POA: Diagnosis not present

## 2014-10-02 DIAGNOSIS — I10 Essential (primary) hypertension: Secondary | ICD-10-CM | POA: Diagnosis not present

## 2014-10-02 DIAGNOSIS — Z8673 Personal history of transient ischemic attack (TIA), and cerebral infarction without residual deficits: Secondary | ICD-10-CM

## 2014-10-02 DIAGNOSIS — S72012A Unspecified intracapsular fracture of left femur, initial encounter for closed fracture: Principal | ICD-10-CM | POA: Diagnosis present

## 2014-10-02 DIAGNOSIS — S72009A Fracture of unspecified part of neck of unspecified femur, initial encounter for closed fracture: Secondary | ICD-10-CM | POA: Diagnosis present

## 2014-10-02 DIAGNOSIS — E118 Type 2 diabetes mellitus with unspecified complications: Secondary | ICD-10-CM | POA: Diagnosis not present

## 2014-10-02 DIAGNOSIS — H25811 Combined forms of age-related cataract, right eye: Secondary | ICD-10-CM

## 2014-10-02 DIAGNOSIS — S72009S Fracture of unspecified part of neck of unspecified femur, sequela: Secondary | ICD-10-CM

## 2014-10-02 DIAGNOSIS — E119 Type 2 diabetes mellitus without complications: Secondary | ICD-10-CM

## 2014-10-02 DIAGNOSIS — S72002A Fracture of unspecified part of neck of left femur, initial encounter for closed fracture: Secondary | ICD-10-CM

## 2014-10-02 DIAGNOSIS — Z7982 Long term (current) use of aspirin: Secondary | ICD-10-CM

## 2014-10-02 DIAGNOSIS — H269 Unspecified cataract: Secondary | ICD-10-CM | POA: Diagnosis present

## 2014-10-02 DIAGNOSIS — Y92091 Bathroom in other non-institutional residence as the place of occurrence of the external cause: Secondary | ICD-10-CM

## 2014-10-02 DIAGNOSIS — S72002D Fracture of unspecified part of neck of left femur, subsequent encounter for closed fracture with routine healing: Secondary | ICD-10-CM | POA: Diagnosis not present

## 2014-10-02 DIAGNOSIS — I739 Peripheral vascular disease, unspecified: Secondary | ICD-10-CM

## 2014-10-02 DIAGNOSIS — I38 Endocarditis, valve unspecified: Secondary | ICD-10-CM | POA: Diagnosis not present

## 2014-10-02 DIAGNOSIS — Z01810 Encounter for preprocedural cardiovascular examination: Secondary | ICD-10-CM | POA: Diagnosis not present

## 2014-10-02 DIAGNOSIS — Z01811 Encounter for preprocedural respiratory examination: Secondary | ICD-10-CM

## 2014-10-02 DIAGNOSIS — M1990 Unspecified osteoarthritis, unspecified site: Secondary | ICD-10-CM

## 2014-10-02 DIAGNOSIS — M25552 Pain in left hip: Secondary | ICD-10-CM | POA: Diagnosis present

## 2014-10-02 DIAGNOSIS — W19XXXS Unspecified fall, sequela: Secondary | ICD-10-CM | POA: Diagnosis not present

## 2014-10-02 DIAGNOSIS — W19XXXA Unspecified fall, initial encounter: Secondary | ICD-10-CM | POA: Insufficient documentation

## 2014-10-02 HISTORY — PX: CATARACT EXTRACTION W/PHACO: SHX586

## 2014-10-02 LAB — PROTIME-INR
INR: 1.06 (ref 0.00–1.49)
PROTHROMBIN TIME: 14 s (ref 11.6–15.2)

## 2014-10-02 LAB — BASIC METABOLIC PANEL
Anion gap: 16 — ABNORMAL HIGH (ref 5–15)
BUN: 31 mg/dL — ABNORMAL HIGH (ref 6–20)
CO2: 25 mmol/L (ref 22–32)
Calcium: 9.9 mg/dL (ref 8.9–10.3)
Chloride: 102 mmol/L (ref 101–111)
Creatinine, Ser: 2.26 mg/dL — ABNORMAL HIGH (ref 0.44–1.00)
GFR calc non Af Amer: 19 mL/min — ABNORMAL LOW (ref >60)
GFR, EST AFRICAN AMERICAN: 23 mL/min — AB (ref >60)
Glucose, Bld: 117 mg/dL — ABNORMAL HIGH (ref 65–99)
POTASSIUM: 4 mmol/L (ref 3.5–5.1)
SODIUM: 143 mmol/L (ref 135–145)

## 2014-10-02 LAB — CBC WITH DIFFERENTIAL/PLATELET
Basophils Absolute: 0 10*3/uL (ref 0.0–0.1)
Basophils Relative: 0 % (ref 0–1)
Eosinophils Absolute: 0.3 10*3/uL (ref 0.0–0.7)
Eosinophils Relative: 2 % (ref 0–5)
HCT: 32.6 % — ABNORMAL LOW (ref 36.0–46.0)
Hemoglobin: 10.5 g/dL — ABNORMAL LOW (ref 12.0–15.0)
Lymphocytes Relative: 21 % (ref 12–46)
Lymphs Abs: 2.7 10*3/uL (ref 0.7–4.0)
MCH: 30 pg (ref 26.0–34.0)
MCHC: 32.2 g/dL (ref 30.0–36.0)
MCV: 93.1 fL (ref 78.0–100.0)
MONOS PCT: 5 % (ref 3–12)
Monocytes Absolute: 0.6 10*3/uL (ref 0.1–1.0)
NEUTROS PCT: 72 % (ref 43–77)
Neutro Abs: 9.2 10*3/uL — ABNORMAL HIGH (ref 1.7–7.7)
PLATELETS: 176 10*3/uL (ref 150–400)
RBC: 3.5 MIL/uL — AB (ref 3.87–5.11)
RDW: 12.7 % (ref 11.5–15.5)
WBC: 12.8 10*3/uL — ABNORMAL HIGH (ref 4.0–10.5)

## 2014-10-02 LAB — URINALYSIS, ROUTINE W REFLEX MICROSCOPIC
BILIRUBIN URINE: NEGATIVE
Glucose, UA: NEGATIVE mg/dL
Ketones, ur: NEGATIVE mg/dL
LEUKOCYTES UA: NEGATIVE
NITRITE: NEGATIVE
Specific Gravity, Urine: 1.01 (ref 1.005–1.030)
Urobilinogen, UA: 0.2 mg/dL (ref 0.0–1.0)
pH: 6 (ref 5.0–8.0)

## 2014-10-02 LAB — URINE MICROSCOPIC-ADD ON

## 2014-10-02 LAB — GLUCOSE, CAPILLARY: Glucose-Capillary: 107 mg/dL — ABNORMAL HIGH (ref 65–99)

## 2014-10-02 SURGERY — PHACOEMULSIFICATION, CATARACT, WITH IOL INSERTION
Anesthesia: Monitor Anesthesia Care | Site: Eye | Laterality: Right

## 2014-10-02 MED ORDER — FENTANYL CITRATE (PF) 100 MCG/2ML IJ SOLN: 25.0000 ug | INTRAMUSCULAR | Status: DC | PRN

## 2014-10-02 MED ORDER — BSS IO SOLN
INTRAOCULAR | Status: DC | PRN
  Administered 2014-10-02: 500 mL

## 2014-10-02 MED ORDER — MORPHINE SULFATE 4 MG/ML IJ SOLN
4.0000 mg | INTRAMUSCULAR | Status: DC | PRN
  Administered 2014-10-02 (×2): 4 mg via INTRAVENOUS
  Filled 2014-10-02 (×2): qty 1

## 2014-10-02 MED ORDER — LIDOCAINE HCL (PF) 1 % IJ SOLN
INTRAOCULAR | Status: DC | PRN
  Administered 2014-10-02: .6 mL via OPHTHALMIC

## 2014-10-02 MED ORDER — AMLODIPINE BESYLATE 5 MG PO TABS
5.0000 mg | ORAL_TABLET | Freq: Every day | ORAL | Status: DC
  Administered 2014-10-03 – 2014-10-05 (×3): 5 mg via ORAL
  Filled 2014-10-02 (×5): qty 1

## 2014-10-02 MED ORDER — PHENYLEPHRINE HCL 2.5 % OP SOLN
1.0000 [drp] | OPHTHALMIC | Status: AC
  Administered 2014-10-02 (×3): 1 [drp] via OPHTHALMIC

## 2014-10-02 MED ORDER — HYDROCODONE-ACETAMINOPHEN 5-325 MG PO TABS
1.0000 | ORAL_TABLET | Freq: Four times a day (QID) | ORAL | Status: DC | PRN
  Administered 2014-10-03: 1 via ORAL
  Administered 2014-10-06 (×2): 2 via ORAL
  Filled 2014-10-02 (×2): qty 1
  Filled 2014-10-02 (×2): qty 2

## 2014-10-02 MED ORDER — INSULIN ASPART 100 UNIT/ML ~~LOC~~ SOLN
0.0000 [IU] | SUBCUTANEOUS | Status: DC
  Administered 2014-10-03: 3 [IU] via SUBCUTANEOUS
  Administered 2014-10-03: 2 [IU] via SUBCUTANEOUS
  Administered 2014-10-04 (×3): 3 [IU] via SUBCUTANEOUS
  Administered 2014-10-04 (×2): 2 [IU] via SUBCUTANEOUS
  Administered 2014-10-05: 1 [IU] via SUBCUTANEOUS
  Administered 2014-10-05 (×2): 2 [IU] via SUBCUTANEOUS
  Administered 2014-10-05: 1 [IU] via SUBCUTANEOUS
  Administered 2014-10-05 (×2): 2 [IU] via SUBCUTANEOUS
  Administered 2014-10-05 – 2014-10-06 (×3): 1 [IU] via SUBCUTANEOUS
  Administered 2014-10-06: 2 [IU] via SUBCUTANEOUS
  Administered 2014-10-06 (×2): 1 [IU] via SUBCUTANEOUS
  Administered 2014-10-07 (×3): 2 [IU] via SUBCUTANEOUS

## 2014-10-02 MED ORDER — LACTATED RINGERS IV SOLN
INTRAVENOUS | Status: DC
  Administered 2014-10-02: 10:00:00 via INTRAVENOUS

## 2014-10-02 MED ORDER — POVIDONE-IODINE 5 % OP SOLN
OPHTHALMIC | Status: DC | PRN
  Administered 2014-10-02: 1 via OPHTHALMIC

## 2014-10-02 MED ORDER — NEOMYCIN-POLYMYXIN-DEXAMETH 3.5-10000-0.1 OP SUSP
OPHTHALMIC | Status: DC | PRN
  Administered 2014-10-02: 2 [drp] via OPHTHALMIC

## 2014-10-02 MED ORDER — MIDAZOLAM HCL 2 MG/2ML IJ SOLN
1.0000 mg | INTRAMUSCULAR | Status: DC | PRN
  Administered 2014-10-02 (×2): 1 mg via INTRAVENOUS
  Filled 2014-10-02: qty 2

## 2014-10-02 MED ORDER — TETRACAINE HCL 0.5 % OP SOLN
1.0000 [drp] | OPHTHALMIC | Status: AC
  Administered 2014-10-02 (×3): 1 [drp] via OPHTHALMIC

## 2014-10-02 MED ORDER — CYCLOPENTOLATE-PHENYLEPHRINE 0.2-1 % OP SOLN
1.0000 [drp] | OPHTHALMIC | Status: AC
  Administered 2014-10-02 (×3): 1 [drp] via OPHTHALMIC

## 2014-10-02 MED ORDER — METOPROLOL SUCCINATE ER 25 MG PO TB24
25.0000 mg | ORAL_TABLET | Freq: Every day | ORAL | Status: DC
  Administered 2014-10-03 – 2014-10-05 (×3): 25 mg via ORAL
  Filled 2014-10-02 (×5): qty 1

## 2014-10-02 MED ORDER — LIDOCAINE HCL 3.5 % OP GEL
1.0000 "application " | Freq: Once | OPHTHALMIC | Status: AC
  Administered 2014-10-02: 1 via OPHTHALMIC

## 2014-10-02 MED ORDER — BSS IO SOLN
INTRAOCULAR | Status: DC | PRN
  Administered 2014-10-02: 15 mL

## 2014-10-02 MED ORDER — EPINEPHRINE HCL 1 MG/ML IJ SOLN
INTRAMUSCULAR | Status: AC
  Filled 2014-10-02: qty 1

## 2014-10-02 MED ORDER — METHOCARBAMOL 500 MG PO TABS: 500.0000 mg | ORAL_TABLET | Freq: Four times a day (QID) | ORAL | Status: DC | PRN

## 2014-10-02 MED ORDER — ONDANSETRON HCL 4 MG/2ML IJ SOLN: 4.0000 mg | Freq: Once | INTRAMUSCULAR | Status: DC | PRN

## 2014-10-02 MED ORDER — METHOCARBAMOL 1000 MG/10ML IJ SOLN
500.0000 mg | Freq: Four times a day (QID) | INTRAVENOUS | Status: DC | PRN
  Filled 2014-10-02: qty 5

## 2014-10-02 MED ORDER — LACTATED RINGERS IV SOLN
INTRAVENOUS | Status: DC
  Administered 2014-10-03 – 2014-10-04 (×3): via INTRAVENOUS
  Administered 2014-10-04: 75 mL/h via INTRAVENOUS
  Administered 2014-10-05: 11:00:00 via INTRAVENOUS

## 2014-10-02 MED ORDER — MORPHINE SULFATE 2 MG/ML IJ SOLN: 0.5000 mg | INTRAMUSCULAR | Status: DC | PRN

## 2014-10-02 MED ORDER — ONDANSETRON HCL 4 MG/2ML IJ SOLN
4.0000 mg | Freq: Once | INTRAMUSCULAR | Status: AC
  Administered 2014-10-02: 4 mg via INTRAVENOUS
  Filled 2014-10-02: qty 2

## 2014-10-02 MED ORDER — FENTANYL CITRATE (PF) 100 MCG/2ML IJ SOLN
25.0000 ug | INTRAMUSCULAR | Status: AC
  Administered 2014-10-02 (×2): 25 ug via INTRAVENOUS
  Filled 2014-10-02: qty 2

## 2014-10-02 SURGICAL SUPPLY — 11 items
CLOTH BEACON ORANGE TIMEOUT ST (SAFETY) ×2 IMPLANT
EYE SHIELD UNIVERSAL CLEAR (GAUZE/BANDAGES/DRESSINGS) ×2 IMPLANT
GLOVE BIOGEL PI IND STRL 7.0 (GLOVE) IMPLANT
GLOVE BIOGEL PI INDICATOR 7.0 (GLOVE) ×2
GLOVE EXAM NITRILE MD LF STRL (GLOVE) ×2 IMPLANT
PAD ARMBOARD 7.5X6 YLW CONV (MISCELLANEOUS) ×2 IMPLANT
SIGHTPATH CAT PROC W REG LENS (Ophthalmic Related) ×3 IMPLANT
SYRINGE LUER LOK 1CC (MISCELLANEOUS) ×2 IMPLANT
TAPE SURG TRANSPORE 1 IN (GAUZE/BANDAGES/DRESSINGS) IMPLANT
TAPE SURGICAL TRANSPORE 1 IN (GAUZE/BANDAGES/DRESSINGS) ×2
WATER STERILE IRR 250ML POUR (IV SOLUTION) ×2 IMPLANT

## 2014-10-02 NOTE — Discharge Instructions (Signed)

## 2014-10-02 NOTE — ED Notes (Signed)
Pt had cataract surgery this am , was in the bathroom , believes that she slipped and fell, but didn't deny that she got a little dizzy - Pt co Lt hip pain . Denies any other injury

## 2014-10-02 NOTE — Transfer of Care (Signed)
Immediate Anesthesia Transfer of Care Note  Patient: Marie Hunt  Procedure(s) Performed: Procedure(s): CATARACT EXTRACTION PHACO AND INTRAOCULAR LENS PLACEMENT RIGHT EYE CDE=5.83 (Right)  Patient Location: Short Stay  Anesthesia Type:MAC  Level of Consciousness: awake, alert , oriented and patient cooperative  Airway & Oxygen Therapy: Patient Spontanous Breathing  Post-op Assessment: Report given to RN, Post -op Vital signs reviewed and stable and Patient moving all extremities  Post vital signs: Reviewed and stable  Last Vitals:  Filed Vitals:   10/02/14 0950  BP: 163/52  Pulse:   Temp:   Resp: 17    Complications: No apparent anesthesia complications

## 2014-10-02 NOTE — Anesthesia Preprocedure Evaluation (Signed)
Anesthesia Evaluation  Patient identified by MRN, date of birth, ID band Patient awake    Reviewed: Allergy & Precautions, NPO status , Patient's Chart, lab work & pertinent test results, reviewed documented beta blocker date and time   Airway Mallampati: II  TM Distance: >3 FB     Dental  (+) Edentulous Upper, Edentulous Lower   Pulmonary former smoker,  breath sounds clear to auscultation        Cardiovascular hypertension, Pt. on medications and Pt. on home beta blockers + Peripheral Vascular Disease Rhythm:Regular Rate:Normal     Neuro/Psych CVA    GI/Hepatic GERD-  ,  Endo/Other  diabetes, Type 2, Oral Hypoglycemic Agents  Renal/GU      Musculoskeletal   Abdominal   Peds  Hematology   Anesthesia Other Findings   Reproductive/Obstetrics                             Anesthesia Physical Anesthesia Plan  ASA: III  Anesthesia Plan: MAC   Post-op Pain Management:    Induction: Intravenous  Airway Management Planned: Nasal Cannula  Additional Equipment:   Intra-op Plan:   Post-operative Plan:   Informed Consent: I have reviewed the patients History and Physical, chart, labs and discussed the procedure including the risks, benefits and alternatives for the proposed anesthesia with the patient or authorized representative who has indicated his/her understanding and acceptance.     Plan Discussed with:   Anesthesia Plan Comments:         Anesthesia Quick Evaluation

## 2014-10-02 NOTE — H&P (Signed)
I have reviewed the H&P, the patient was re-examined, and I have identified no interval changes in medical condition and plan of care since the history and physical of record  

## 2014-10-02 NOTE — ED Provider Notes (Signed)
CSN: [id number]     Arrival date & time 10/02/14  1745 History   First MD Initiated Contact with Patient 10/02/14 1858     Chief Complaint  Patient presents with  . Fall  . Hip Pain      HPI  Pt fell this morning at home after slipping on bathroom floor.  Uncomplicated cataract surgery at [facility] this am.  No additional complaints.  Past Medical History  Diagnosis Date  . GERD (gastroesophageal reflux disease)   . Carotid artery disease     [id number] RICA, patent LICA [id number] - Dr. Scot Dock  . Essential hypertension, benign   . Type 2 diabetes mellitus   . Mixed hyperlipidemia   . Sigmoid diverticulosis   . Cholelithiasis   . Diabetes mellitus     Type II  . Cholelithiasis     In need of cholecystectomy  . Sigmoid diverticulosis   . GERD (gastroesophageal reflux disease)   . Anemia    Past Surgical History  Procedure Laterality Date  . Left carotid endarterectomy  7/11    Dr. Scot Dock  . Carotid endarterectomy  08/24/2009    Left catroid endarterectomy  . Cataract extraction w/phaco Left 08/17/2014    Procedure: CATARACT EXTRACTION PHACO AND INTRAOCULAR LENS PLACEMENT; CDE:  7.39;  Surgeon: Tonny Branch, MD;  Location: [facility];  Service: Ophthalmology;  Laterality: Left;  [facility] Eye surgery     Family History  Problem Relation Age of Onset  . Coronary artery disease    . Heart disease Sister   . Heart disease Sister   . Heart disease Sister   . Diabetes Mother   . Diabetes Father   . Heart attack Daughter   . Other Daughter     Bleeding problems   History  Substance Use Topics  . Smoking status: Former Smoker -- 0.30 packs/day for 60 years    Types: Cigarettes    Quit date: 10/27/2011  . Smokeless tobacco: Never Used  . Alcohol Use: No   OB History    No data available     Review of Systems  Constitutional: Negative for fever, chills, diaphoresis, appetite change and fatigue.  HENT: Negative for mouth sores, sore throat and trouble swallowing.   Eyes: Negative for visual  disturbance.  Respiratory: Negative for cough, chest tightness, shortness of breath and wheezing.   Cardiovascular: Negative for chest pain.  Gastrointestinal: Negative for nausea, vomiting, abdominal pain, diarrhea and abdominal distention.  Endocrine: Negative for polydipsia, polyphagia and polyuria.  Genitourinary: Negative for dysuria, frequency and hematuria.  Musculoskeletal: Negative for gait problem.       Left hip pain.  Skin: Negative for color change, pallor and rash.  Neurological: Negative for dizziness, syncope, light-headedness and headaches.  Hematological: Does not bruise/bleed easily.  Psychiatric/Behavioral: Negative for behavioral problems and confusion.      Allergies  Codeine  Home Medications   Prior to Admission medications   Medication Sig Start Date End Date Taking? Authorizing Provider  amLODipine (NORVASC) 10 MG tablet Take 5 mg by mouth daily.    Yes Historical Provider, MD  aspirin 81 MG tablet Take 81 mg by mouth daily.     Yes Historical Provider, MD  B Complex Vitamins (B-COMPLEX/B-12 PO) Take 1,000 mg by mouth daily.   Yes Historical Provider, MD  calcium-vitamin D (OSCAL WITH D) 500-200 MG-UNIT per tablet Take 1 tablet by mouth daily.     Yes Historical Provider, MD  Calcium-Vitamin D 600-200 MG-UNIT per tablet Take  1 tablet by mouth daily.   Yes Historical Provider, MD  diclofenac (VOLTAREN) 75 MG EC tablet Take 75 mg by mouth 2 (two) times daily.     Yes Historical Provider, MD  ferrous sulfate 325 (65 FE) MG tablet Take 325 mg by mouth daily with breakfast.     Yes Historical Provider, MD  folic acid (FOLVITE) 1 MG tablet Take 1 mg by mouth daily.   Yes Historical Provider, MD  furosemide (LASIX) 40 MG tablet Take 40 mg by mouth daily. 09/20/14  Yes Historical Provider, MD  glimepiride (AMARYL) 2 MG tablet Take 2 mg by mouth daily before breakfast.     Yes Historical Provider, MD  ketorolac (ACULAR) 0.5 % ophthalmic solution Apply 1 drop to eye 2  (two) times daily. To start 3 days prior to operation (10/02/14-scheduled date for operation), then continue as directed 09/08/14  Yes Historical Provider, MD  lidocaine (XYLOCAINE) 5 % ointment Apply 1 application topically 3 (three) times daily as needed. For back pain 09/04/14  Yes Historical Provider, MD  lisinopril (PRINIVIL,ZESTRIL) 20 MG tablet Take 20 mg by mouth daily. 09/30/14  Yes Historical Provider, MD  metFORMIN (GLUCOPHAGE) 500 MG tablet Take 500 mg by mouth 2 (two) times daily with a meal.    Yes Historical Provider, MD  metoprolol succinate (TOPROL-XL) 25 MG 24 hr tablet Take 25 mg by mouth daily.     Yes Historical Provider, MD  ofloxacin (OCUFLOX) 0.3 % ophthalmic solution Apply 1 drop to eye 4 (four) times daily. *to start 3 days prior to operation (10/02/14-scheduled operation) and to continue for 1 week after the surgery 09/08/14  Yes Historical Provider, MD  omeprazole (PRILOSEC) 20 MG capsule Take 20 mg by mouth 2 (two) times daily.    Yes Historical Provider, MD  prednisoLONE acetate (PRED FORTE) 1 % ophthalmic suspension Apply 1 drop to eye 3 (three) times daily. *To start following surgical procedure (10/02/14--scheduled surgery date) and continue 09/07/14  Yes Historical Provider, MD  simvastatin (ZOCOR) 20 MG tablet Take 20 mg by mouth at bedtime.     Yes Historical Provider, MD   BP 176/53 mmHg  Pulse 73  Temp(Src) 98.9 F (37.2 C) (Oral)  Resp 16  Ht 5\' 3"  (1.6 m)  Wt 143 lb 4.8 oz (65 kg)  BMI 25.39 kg/m2  SpO2 94% Physical Exam  Constitutional: She is oriented to person, place, and time. She appears well-developed and well-nourished. No distress.  HENT:  Head: Normocephalic.  Eyes: Conjunctivae are normal. Pupils are equal, round, and reactive to light. No scleral icterus.  Neck: Normal range of motion. Neck supple. No thyromegaly present.  Cardiovascular: Normal rate and regular rhythm.  Exam reveals no gallop and no friction rub.   No murmur heard. Pulmonary/Chest:  Effort normal and breath sounds normal. No respiratory distress. She has no wheezes. She has no rales.  Abdominal: Soft. Bowel sounds are normal. She exhibits no distension. There is no tenderness. There is no rebound.  Musculoskeletal: Normal range of motion.       Legs: Neurological: She is alert and oriented to person, place, and time.  Skin: Skin is warm and dry. No rash noted.  Psychiatric: She has a normal mood and affect. Her behavior is normal.    ED Course  Procedures (including critical care time) Labs Review Labs Reviewed  CBC WITH DIFFERENTIAL/PLATELET - Abnormal; Notable for the following:    WBC 12.8 (*)    RBC 3.50 (*)  Hemoglobin 10.5 (*)    HCT 32.6 (*)    Neutro Abs 9.2 (*)    All other components within normal limits  BASIC METABOLIC PANEL - Abnormal; Notable for the following:    Glucose, Bld 117 (*)    BUN 31 (*)    Creatinine, Ser 2.26 (*)    GFR calc non Af Amer 19 (*)    GFR calc Af Amer 23 (*)    Anion gap 16 (*)    All other components within normal limits  URINALYSIS, ROUTINE W REFLEX MICROSCOPIC (NOT AT [facility]) - Abnormal; Notable for the following:    Hgb urine dipstick TRACE (*)    Protein, ur TRACE (*)    All other components within normal limits  URINE CULTURE  PROTIME-INR  URINE MICROSCOPIC-ADD ON  TYPE AND SCREEN    Imaging Review Dg Chest 1 View  10/02/2014   CLINICAL DATA:  Status post slip and fall today.  EXAM: CHEST  1 VIEW  COMPARISON:  PA and lateral chest 08/24/2009.  FINDINGS: The lungs are clear. Heart size is normal. No pneumothorax or pleural effusion. No focal bony abnormality.  IMPRESSION: No acute disease.   Electronically Signed   By: Inge Rise M.D.   On: 10/02/2014 19:54   Dg Knee Complete 4 Views Left  10/02/2014   CLINICAL DATA:  Pain following fall  EXAM: LEFT KNEE - COMPLETE 4+ VIEW  COMPARISON:  None.  FINDINGS: Frontal, bilateral oblique, and lateral images were obtained. There is no demonstrable fracture or  dislocation. No appreciable joint effusion. There is moderate generalized joint space narrowing. No erosive change. There are foci of atherosclerotic calcification.  IMPRESSION: Generalized osteoarthritic change. No fracture or joint effusion. Areas of atherosclerotic calcification.   Electronically Signed   By: Lowella Grip III M.D.   On: 10/02/2014 19:54   Dg Hip Unilat With Pelvis 2-3 Views Left  10/02/2014   CLINICAL DATA:  Status post fall today. Left hip pain. Initial encounter.  EXAM: DG HIP (WITH OR WITHOUT PELVIS) 2-3V LEFT  COMPARISON:  None.  FINDINGS: The patient has an acute subcapital left hip fracture. No other acute bony or joint abnormality is identified. Bones are osteopenic. Lower lumbar spondylosis is noted.  IMPRESSION: Subcapital fracture left hip.   Electronically Signed   By: Inge Rise M.D.   On: 10/02/2014 19:18     EKG Interpretation None      MDM   Final diagnoses:  Fall  Pre-op chest exam  Hip fracture, left, closed, initial encounter    D/W Dr. Luna Glasgow, and Dr. Shanon Brow. Will admit. D/W family at length.    Tanna Furry, MD 10/02/14 [id number]

## 2014-10-02 NOTE — H&P (Signed)
PCP:   Monico Blitz, MD   Chief Complaint:  fell  HPI: 81 yo female had cataract surgery today went home and slipped and fell on something and hurt her left hip.  No LOC.  Found to have left hip fracture.  Lives alone and very independent.  Normal state of health prior to this accident xcept for the cataract surgery today.  Review of Systems:  Positive and negative as per HPI otherwise all other systems are negative  Past Medical History: Past Medical History  Diagnosis Date  . GERD (gastroesophageal reflux disease)   . Carotid artery disease     [id number] RICA, patent LICA [id number] - Dr. Scot Dock  . Essential hypertension, benign   . Type 2 diabetes mellitus   . Mixed hyperlipidemia   . Sigmoid diverticulosis   . Cholelithiasis   . Diabetes mellitus     Type II  . Cholelithiasis     In need of cholecystectomy  . Sigmoid diverticulosis   . GERD (gastroesophageal reflux disease)   . Anemia    Past Surgical History  Procedure Laterality Date  . Left carotid endarterectomy  7/11    Dr. Scot Dock  . Carotid endarterectomy  08/24/2009    Left catroid endarterectomy  . Cataract extraction w/phaco Left 08/17/2014    Procedure: CATARACT EXTRACTION PHACO AND INTRAOCULAR LENS PLACEMENT; CDE:  7.39;  Surgeon: Tonny Branch, MD;  Location: [facility];  Service: Ophthalmology;  Laterality: Left;  [facility] Eye surgery      Medications: Prior to Admission medications   Medication Sig Start Date End Date Taking? Authorizing Provider  amLODipine (NORVASC) 10 MG tablet Take 5 mg by mouth daily.    Yes Historical Provider, MD  aspirin 81 MG tablet Take 81 mg by mouth daily.     Yes Historical Provider, MD  B Complex Vitamins (B-COMPLEX/B-12 PO) Take 1,000 mg by mouth daily.   Yes Historical Provider, MD  calcium-vitamin D (OSCAL WITH D) 500-200 MG-UNIT per tablet Take 1 tablet by mouth daily.     Yes Historical Provider, MD  Calcium-Vitamin D 600-200 MG-UNIT per tablet Take 1 tablet by mouth daily.   Yes  Historical Provider, MD  diclofenac (VOLTAREN) 75 MG EC tablet Take 75 mg by mouth 2 (two) times daily.     Yes Historical Provider, MD  ferrous sulfate 325 (65 FE) MG tablet Take 325 mg by mouth daily with breakfast.     Yes Historical Provider, MD  folic acid (FOLVITE) 1 MG tablet Take 1 mg by mouth daily.   Yes Historical Provider, MD  furosemide (LASIX) 40 MG tablet Take 40 mg by mouth daily. 09/20/14  Yes Historical Provider, MD  glimepiride (AMARYL) 2 MG tablet Take 2 mg by mouth daily before breakfast.     Yes Historical Provider, MD  ketorolac (ACULAR) 0.5 % ophthalmic solution Apply 1 drop to eye 2 (two) times daily. To start 3 days prior to operation (10/02/14-scheduled date for operation), then continue as directed 09/08/14  Yes Historical Provider, MD  lidocaine (XYLOCAINE) 5 % ointment Apply 1 application topically 3 (three) times daily as needed. For back pain 09/04/14  Yes Historical Provider, MD  lisinopril (PRINIVIL,ZESTRIL) 20 MG tablet Take 20 mg by mouth daily. 09/30/14  Yes Historical Provider, MD  metFORMIN (GLUCOPHAGE) 500 MG tablet Take 500 mg by mouth 2 (two) times daily with a meal.    Yes Historical Provider, MD  metoprolol succinate (TOPROL-XL) 25 MG 24 hr tablet Take 25 mg by mouth daily.  Yes Historical Provider, MD  ofloxacin (OCUFLOX) 0.3 % ophthalmic solution Apply 1 drop to eye 4 (four) times daily. *to start 3 days prior to operation (10/02/14-scheduled operation) and to continue for 1 week after the surgery 09/08/14  Yes Historical Provider, MD  omeprazole (PRILOSEC) 20 MG capsule Take 20 mg by mouth 2 (two) times daily.    Yes Historical Provider, MD  prednisoLONE acetate (PRED FORTE) 1 % ophthalmic suspension Apply 1 drop to eye 3 (three) times daily. *To start following surgical procedure (10/02/14--scheduled surgery date) and continue 09/07/14  Yes Historical Provider, MD  simvastatin (ZOCOR) 20 MG tablet Take 20 mg by mouth at bedtime.     Yes Historical Provider, MD     Allergies:   Allergies  Allergen Reactions  . Codeine Nausea And Vomiting    Social History:  reports that she quit smoking about 2 years ago. Her smoking use included Cigarettes. She has a 18 pack-year smoking history. She has never used smokeless tobacco. She reports that she does not drink alcohol or use illicit drugs.  Family History: Family History  Problem Relation Age of Onset  . Coronary artery disease    . Heart disease Sister   . Heart disease Sister   . Heart disease Sister   . Diabetes Mother   . Diabetes Father   . Heart attack Daughter   . Other Daughter     Bleeding problems    Physical Exam: Filed Vitals:   10/02/14 1923 10/02/14 1930 10/02/14 2100 10/02/14 2102  BP: 124/47 110/45 169/55 169/55  Pulse: 62 61  69  Temp:      TempSrc:      Resp: 18  16 20   Height:      Weight:      SpO2: 94% 92%  92%   General appearance: alert, cooperative and no distress Head: Normocephalic, without obvious abnormality, atraumatic Eyes: negative Nose: Nares normal. Septum midline. Mucosa normal. No drainage or sinus tenderness. Neck: no JVD and supple, symmetrical, trachea midline Lungs: clear to auscultation bilaterally Heart: regular rate and rhythm and systolic murmur: early systolic 3/6, blowing at 2nd left intercostal space Abdomen: soft, non-tender; bowel sounds normal; no masses,  no organomegaly Extremities: extremities normal, atraumatic, no cyanosis or edema Pulses: 2+ and symmetric Skin: Skin color, texture, turgor normal. No rashes or lesions Neurologic: Grossly normal    Labs on Admission:   Recent Labs  10/02/14 2000  NA 143  K 4.0  CL 102  CO2 25  GLUCOSE 117*  BUN 31*  CREATININE 2.26*  CALCIUM 9.9    Recent Labs  10/02/14 2000  WBC 12.8*  NEUTROABS 9.2*  HGB 10.5*  HCT 32.6*  MCV 93.1  PLT 176   Radiological Exams on Admission: Dg Chest 1 View  10/02/2014   CLINICAL DATA:  Status post slip and fall today.  EXAM: CHEST   1 VIEW  COMPARISON:  PA and lateral chest 08/24/2009.  FINDINGS: The lungs are clear. Heart size is normal. No pneumothorax or pleural effusion. No focal bony abnormality.  IMPRESSION: No acute disease.   Electronically Signed   By: Inge Rise M.D.   On: 10/02/2014 19:54   Dg Knee Complete 4 Views Left  10/02/2014   CLINICAL DATA:  Pain following fall  EXAM: LEFT KNEE - COMPLETE 4+ VIEW  COMPARISON:  None.  FINDINGS: Frontal, bilateral oblique, and lateral images were obtained. There is no demonstrable fracture or dislocation. No appreciable joint effusion. There is moderate  generalized joint space narrowing. No erosive change. There are foci of atherosclerotic calcification.  IMPRESSION: Generalized osteoarthritic change. No fracture or joint effusion. Areas of atherosclerotic calcification.   Electronically Signed   By: Lowella Grip III M.D.   On: 10/02/2014 19:54   Dg Hip Unilat With Pelvis 2-3 Views Left  10/02/2014   CLINICAL DATA:  Status post fall today. Left hip pain. Initial encounter.  EXAM: DG HIP (WITH OR WITHOUT PELVIS) 2-3V LEFT  COMPARISON:  None.  FINDINGS: The patient has an acute subcapital left hip fracture. No other acute bony or joint abnormality is identified. Bones are osteopenic. Lower lumbar spondylosis is noted.  IMPRESSION: Subcapital fracture left hip.   Electronically Signed   By: Inge Rise M.D.   On: 10/02/2014 19:18    Assessment/Plan  81 yo female with mechanical fall and left hip fracture  Principal Problem:   Hip fracture- dr Luna Glasgow called plan to go to OR tomorrow will keep npo and hold lovenox for now.  Hip fracture pathway with morphine prn.  Pt moderate to high risk for surgery due to her multiple comorbidities she is aware of this.  Will consult cardiology for preop evaluation also.  Pt follows with dr Doren Custard for a heart murmur ???aortic stenosis.  Active Problems:   Hypertension-  Give norvasc and metoprolol now po   Carotid artery disease-  noted   Type 2 diabetes mellitus  Place on ssi Murmur pt had nml stress test in 2012,  Has yearly echo done but records not in EPIC.  Cardiology consult  Admit to  Medical.  Pt is full code but does not want prolonged extreme measures.    Chikita Dogan A 10/02/2014, 9:44 PM

## 2014-10-02 NOTE — ED Notes (Signed)
Patient resting in bed at this time. Patient in a stated position of comfort. Family at bedside. No needs voiced.

## 2014-10-02 NOTE — Op Note (Signed)
Date of Admission: 10/02/2014  Date of Surgery: 10/02/2014   Pre-Op Dx: Cataract Right Eye  Post-Op Dx: Senile Combined Cataract Right  Eye,  Dx Code [id number]  Surgeon: Tonny Branch, M.D.  Assistants: None  Anesthesia: Topical with MAC  Indications: Painless, progressive loss of vision with compromise of daily activities.  Surgery: Cataract Extraction with Intraocular lens Implant Right Eye  Discription: The patient had dilating drops and viscous lidocaine placed into the Right eye in the pre-op holding area. After transfer to the operating room, a time out was performed. The patient was then prepped and draped. Beginning with a 14 degree blade a paracentesis port was made at the surgeon's 2 o'clock position. The anterior chamber was then filled with 1% non-preserved lidocaine. This was followed by filling the anterior chamber with Provisc.  A 2.45mm keratome blade was used to make a clear corneal incision at the temporal limbus.  A bent cystatome needle was used to create a continuous tear capsulotomy. Hydrodissection was performed with balanced salt solution on a Fine canula. The lens nucleus was then removed using the phacoemulsification handpiece. Residual cortex was removed with the I&A handpiece. The anterior chamber and capsular bag were refilled with Provisc. A posterior chamber intraocular lens was placed into the capsular bag with it's injector. The implant was positioned with the Kuglan hook. The Provisc was then removed from the anterior chamber and capsular bag with the I&A handpiece. Stromal hydration of the main incision and paracentesis port was performed with BSS on a Fine canula. The wounds were tested for leak which was negative. The patient tolerated the procedure well. There were no operative complications. The patient was then transferred to the recovery room in stable condition.  Complications: None  Specimen: None  EBL: None  Prosthetic device: Hoya iSert 250, power 23.0 D,  SN [id number].

## 2014-10-02 NOTE — Anesthesia Postprocedure Evaluation (Signed)
  Anesthesia Post-op Note  Patient: Marie Hunt  Procedure(s) Performed: Procedure(s): CATARACT EXTRACTION PHACO AND INTRAOCULAR LENS PLACEMENT RIGHT EYE CDE=5.83 (Right)  Patient Location: Short Stay  Anesthesia Type:MAC  Level of Consciousness: awake, alert , oriented and patient cooperative  Airway and Oxygen Therapy: Patient Spontanous Breathing  Post-op Pain: none  Post-op Assessment: Post-op Vital signs reviewed, Patient's Cardiovascular Status Stable, Respiratory Function Stable, Patent Airway, No signs of Nausea or vomiting and Pain level controlled              Post-op Vital Signs: Reviewed and stable  Last Vitals:  Filed Vitals:   10/02/14 0950  BP: 163/52  Pulse:   Temp:   Resp: 17    Complications: No apparent anesthesia complications

## 2014-10-03 ENCOUNTER — Inpatient Hospital Stay (HOSPITAL_COMMUNITY): Payer: Medicare Other | Admitting: Anesthesiology

## 2014-10-03 ENCOUNTER — Inpatient Hospital Stay (HOSPITAL_COMMUNITY): Payer: Medicare Other

## 2014-10-03 ENCOUNTER — Encounter (HOSPITAL_COMMUNITY): Payer: Self-pay | Admitting: Ophthalmology

## 2014-10-03 ENCOUNTER — Encounter (HOSPITAL_COMMUNITY)
Admission: EM | Disposition: A | Discharge status: Skilled Nursing Facility | Payer: Self-pay | Source: Home / Self Care | Attending: Internal Medicine

## 2014-10-03 DIAGNOSIS — I10 Essential (primary) hypertension: Secondary | ICD-10-CM

## 2014-10-03 DIAGNOSIS — I779 Disorder of arteries and arterioles, unspecified: Secondary | ICD-10-CM

## 2014-10-03 DIAGNOSIS — E118 Type 2 diabetes mellitus with unspecified complications: Secondary | ICD-10-CM

## 2014-10-03 DIAGNOSIS — Z01818 Encounter for other preprocedural examination: Secondary | ICD-10-CM

## 2014-10-03 DIAGNOSIS — I38 Endocarditis, valve unspecified: Secondary | ICD-10-CM

## 2014-10-03 DIAGNOSIS — S72002D Fracture of unspecified part of neck of left femur, subsequent encounter for closed fracture with routine healing: Secondary | ICD-10-CM

## 2014-10-03 HISTORY — PX: HIP ARTHROPLASTY: SHX981

## 2014-10-03 LAB — CBC
HCT: 29.2 % — ABNORMAL LOW (ref 36.0–46.0)
HEMOGLOBIN: 9.4 g/dL — AB (ref 12.0–15.0)
MCH: 29.9 pg (ref 26.0–34.0)
MCHC: 32.2 g/dL (ref 30.0–36.0)
MCV: 93 fL (ref 78.0–100.0)
Platelets: 158 10*3/uL (ref 150–400)
RBC: 3.14 MIL/uL — AB (ref 3.87–5.11)
RDW: 12.9 % (ref 11.5–15.5)
WBC: 9.3 10*3/uL (ref 4.0–10.5)

## 2014-10-03 LAB — GLUCOSE, CAPILLARY
GLUCOSE-CAPILLARY: 75 mg/dL (ref 65–99)
GLUCOSE-CAPILLARY: 130 mg/dL — AB (ref 65–99)
GLUCOSE-CAPILLARY: 125 mg/dL — AB (ref 65–99)
GLUCOSE-CAPILLARY: 126 mg/dL — AB (ref 65–99)
Glucose-Capillary: 138 mg/dL — ABNORMAL HIGH (ref 65–99)
Glucose-Capillary: 161 mg/dL — ABNORMAL HIGH (ref 65–99)
Glucose-Capillary: 165 mg/dL — ABNORMAL HIGH (ref 65–99)
Glucose-Capillary: 204 mg/dL — ABNORMAL HIGH (ref 65–99)

## 2014-10-03 LAB — PREPARE RBC (CROSSMATCH)

## 2014-10-03 LAB — BASIC METABOLIC PANEL
ANION GAP: 8 (ref 5–15)
BUN: 29 mg/dL — ABNORMAL HIGH (ref 6–20)
CHLORIDE: 103 mmol/L (ref 101–111)
CO2: 30 mmol/L (ref 22–32)
Calcium: 9.1 mg/dL (ref 8.9–10.3)
Creatinine, Ser: 2.21 mg/dL — ABNORMAL HIGH (ref 0.44–1.00)
GFR calc Af Amer: 23 mL/min — ABNORMAL LOW (ref >60)
GFR calc non Af Amer: 20 mL/min — ABNORMAL LOW (ref >60)
Glucose, Bld: 131 mg/dL — ABNORMAL HIGH (ref 65–99)
Potassium: 4 mmol/L (ref 3.5–5.1)
Sodium: 141 mmol/L (ref 135–145)

## 2014-10-03 LAB — HEMOGLOBIN AND HEMATOCRIT, BLOOD
HEMATOCRIT: 31.8 % — AB (ref 36.0–46.0)
HEMOGLOBIN: 10.4 g/dL — AB (ref 12.0–15.0)

## 2014-10-03 LAB — SURGICAL PCR SCREEN
MRSA, PCR: NEGATIVE
Staphylococcus aureus: NEGATIVE

## 2014-10-03 LAB — ABO/RH: ABO/RH(D): O POS

## 2014-10-03 SURGERY — HEMIARTHROPLASTY, HIP, DIRECT ANTERIOR APPROACH, FOR FRACTURE
Anesthesia: Spinal | Site: Hip | Laterality: Left

## 2014-10-03 MED ORDER — SODIUM CHLORIDE 0.9 % IJ SOLN: 9.0000 mL | INTRAMUSCULAR | Status: DC | PRN

## 2014-10-03 MED ORDER — NALOXONE HCL 0.4 MG/ML IJ SOLN: 0.4000 mg | INTRAMUSCULAR | Status: DC | PRN

## 2014-10-03 MED ORDER — EPHEDRINE SULFATE 50 MG/ML IJ SOLN
INTRAMUSCULAR | Status: AC
  Filled 2014-10-03: qty 1

## 2014-10-03 MED ORDER — ACETAMINOPHEN 325 MG PO TABS: 650.0000 mg | ORAL_TABLET | Freq: Four times a day (QID) | ORAL | Status: DC | PRN

## 2014-10-03 MED ORDER — AMLODIPINE BESYLATE 5 MG PO TABS
5.0000 mg | ORAL_TABLET | Freq: Once | ORAL | Status: AC
  Administered 2014-10-03: 5 mg via ORAL

## 2014-10-03 MED ORDER — BUPIVACAINE IN DEXTROSE 0.75-8.25 % IT SOLN
INTRATHECAL | Status: DC | PRN
Start: 2014-10-03 — End: 2014-10-03
  Administered 2014-10-03: 15 mg via INTRATHECAL

## 2014-10-03 MED ORDER — SODIUM CHLORIDE 0.9 % IR SOLN
Status: DC | PRN
  Administered 2014-10-03: 1000 mL

## 2014-10-03 MED ORDER — SODIUM CHLORIDE 0.9 % IV SOLN
Freq: Once | INTRAVENOUS | Status: AC
  Administered 2014-10-03: 11:00:00 via INTRAVENOUS

## 2014-10-03 MED ORDER — PROPOFOL 10 MG/ML IV BOLUS
INTRAVENOUS | Status: AC
  Filled 2014-10-03: qty 20

## 2014-10-03 MED ORDER — LIDOCAINE HCL (PF) 1 % IJ SOLN
INTRAMUSCULAR | Status: AC
  Filled 2014-10-03: qty 10

## 2014-10-03 MED ORDER — FENTANYL CITRATE (PF) 100 MCG/2ML IJ SOLN
INTRAMUSCULAR | Status: AC
  Filled 2014-10-03: qty 2

## 2014-10-03 MED ORDER — ONDANSETRON HCL 4 MG/2ML IJ SOLN: 4.0000 mg | Freq: Once | INTRAMUSCULAR | Status: DC | PRN

## 2014-10-03 MED ORDER — MORPHINE SULFATE 1 MG/ML IV SOLN
INTRAVENOUS | Status: DC
  Administered 2014-10-03: 1 mg via INTRAVENOUS

## 2014-10-03 MED ORDER — ONDANSETRON HCL 4 MG/2ML IJ SOLN: 4.0000 mg | Freq: Four times a day (QID) | INTRAMUSCULAR | Status: DC | PRN

## 2014-10-03 MED ORDER — OFLOXACIN 0.3 % OP SOLN
1.0000 [drp] | Freq: Four times a day (QID) | OPHTHALMIC | Status: DC
  Administered 2014-10-03 – 2014-10-04 (×3): 1 [drp] via OPHTHALMIC
  Filled 2014-10-03: qty 5

## 2014-10-03 MED ORDER — PREDNISOLONE ACETATE 1 % OP SUSP
1.0000 [drp] | Freq: Three times a day (TID) | OPHTHALMIC | Status: DC
Start: 2014-10-03 — End: 2014-10-07
  Administered 2014-10-03 – 2014-10-07 (×12): 1 [drp] via OPHTHALMIC
  Filled 2014-10-03: qty 1

## 2014-10-03 MED ORDER — SODIUM CHLORIDE 0.9 % IJ SOLN
INTRAMUSCULAR | Status: AC
  Filled 2014-10-03: qty 10

## 2014-10-03 MED ORDER — ENOXAPARIN SODIUM 30 MG/0.3ML ~~LOC~~ SOLN
30.0000 mg | SUBCUTANEOUS | Status: DC
  Administered 2014-10-04 – 2014-10-07 (×4): 30 mg via SUBCUTANEOUS
  Filled 2014-10-03 (×4): qty 0.3

## 2014-10-03 MED ORDER — FENTANYL CITRATE (PF) 100 MCG/2ML IJ SOLN: 25.0000 ug | INTRAMUSCULAR | Status: DC | PRN

## 2014-10-03 MED ORDER — MORPHINE SULFATE 1 MG/ML IV SOLN
INTRAVENOUS | Status: DC
  Administered 2014-10-04 (×3): 1 mg via INTRAVENOUS
  Administered 2014-10-05: 2 mg via INTRAVENOUS
  Administered 2014-10-05: 1 mg via INTRAVENOUS
  Administered 2014-10-05: 0 mg via INTRAVENOUS
  Administered 2014-10-05: 1 mg via INTRAVENOUS

## 2014-10-03 MED ORDER — ALBUTEROL SULFATE (2.5 MG/3ML) 0.083% IN NEBU: 2.5000 mg | INHALATION_SOLUTION | Freq: Four times a day (QID) | RESPIRATORY_TRACT | Status: DC

## 2014-10-03 MED ORDER — KETOROLAC TROMETHAMINE 0.5 % OP SOLN
1.0000 [drp] | Freq: Two times a day (BID) | OPHTHALMIC | Status: DC
  Administered 2014-10-03 – 2014-10-07 (×8): 1 [drp] via OPHTHALMIC
  Filled 2014-10-03: qty 3

## 2014-10-03 MED ORDER — PROVISC 10 MG/ML IO SOLN
INTRAOCULAR | Status: DC | PRN
  Administered 2014-10-02: 0.85 mL via INTRAOCULAR

## 2014-10-03 MED ORDER — FENTANYL CITRATE (PF) 100 MCG/2ML IJ SOLN
INTRAMUSCULAR | Status: AC
  Filled 2014-10-03: qty 4

## 2014-10-03 MED ORDER — BUPIVACAINE IN DEXTROSE 0.75-8.25 % IT SOLN
INTRATHECAL | Status: AC
  Filled 2014-10-03: qty 2

## 2014-10-03 MED ORDER — MIDAZOLAM HCL 2 MG/2ML IJ SOLN
1.0000 mg | INTRAMUSCULAR | Status: DC | PRN
  Administered 2014-10-03: 2 mg via INTRAVENOUS

## 2014-10-03 MED ORDER — LACTATED RINGERS IV SOLN: INTRAVENOUS | Status: DC

## 2014-10-03 MED ORDER — POVIDONE-IODINE 10 % EX SOLN
CUTANEOUS | Status: AC
  Filled 2014-10-03: qty 118

## 2014-10-03 MED ORDER — SODIUM CHLORIDE 0.9 % IV SOLN
Freq: Once | INTRAVENOUS | Status: AC
  Administered 2014-10-03: 1000 mL via INTRAVENOUS

## 2014-10-03 MED ORDER — EPINEPHRINE HCL 0.1 MG/ML IJ SOSY
PREFILLED_SYRINGE | INTRAMUSCULAR | Status: DC | PRN
  Administered 2014-10-03: .1 ug via INTRATRACHEAL

## 2014-10-03 MED ORDER — DIPHENHYDRAMINE HCL 50 MG/ML IJ SOLN
12.5000 mg | Freq: Four times a day (QID) | INTRAMUSCULAR | Status: DC | PRN
  Filled 2014-10-03: qty 1

## 2014-10-03 MED ORDER — MORPHINE SULFATE 1 MG/ML IV SOLN
INTRAVENOUS | Status: AC
  Filled 2014-10-03: qty 25

## 2014-10-03 MED ORDER — ALBUTEROL SULFATE (2.5 MG/3ML) 0.083% IN NEBU: 2.5000 mg | INHALATION_SOLUTION | RESPIRATORY_TRACT | Status: DC | PRN

## 2014-10-03 MED ORDER — FENTANYL CITRATE (PF) 100 MCG/2ML IJ SOLN
INTRAMUSCULAR | Status: DC | PRN
  Administered 2014-10-03: 12.5 ug via INTRAVENOUS
  Administered 2014-10-03: 25 ug via INTRAVENOUS
  Administered 2014-10-03: 12.5 ug via INTRATHECAL

## 2014-10-03 MED ORDER — MIDAZOLAM HCL 2 MG/2ML IJ SOLN
INTRAMUSCULAR | Status: AC
  Filled 2014-10-03: qty 2

## 2014-10-03 MED ORDER — SODIUM CHLORIDE 0.9 % IV SOLN
INTRAVENOUS | Status: DC | PRN
  Administered 2014-10-03: 12:00:00 via INTRAVENOUS

## 2014-10-03 MED ORDER — HYDROGEN PEROXIDE 3 % EX SOLN
CUTANEOUS | Status: DC | PRN
  Administered 2014-10-03: 1

## 2014-10-03 MED ORDER — PROPOFOL INFUSION 10 MG/ML OPTIME
INTRAVENOUS | Status: DC | PRN
  Administered 2014-10-03: 25 ug/kg/min via INTRAVENOUS

## 2014-10-03 MED ORDER — DIPHENHYDRAMINE HCL 12.5 MG/5ML PO ELIX
12.5000 mg | ORAL_SOLUTION | Freq: Four times a day (QID) | ORAL | Status: DC | PRN
  Administered 2014-10-04: 12.5 mg via ORAL

## 2014-10-03 MED ORDER — METOPROLOL SUCCINATE ER 25 MG PO TB24
25.0000 mg | ORAL_TABLET | Freq: Once | ORAL | Status: AC
  Administered 2014-10-03: 25 mg via ORAL

## 2014-10-03 MED ORDER — EPHEDRINE SULFATE 50 MG/ML IJ SOLN
INTRAMUSCULAR | Status: DC | PRN
  Administered 2014-10-03: 10 mg via INTRAVENOUS
  Administered 2014-10-03: 5 mg via INTRAVENOUS

## 2014-10-03 MED ORDER — FENTANYL CITRATE (PF) 100 MCG/2ML IJ SOLN
25.0000 ug | INTRAMUSCULAR | Status: AC
  Administered 2014-10-03: 25 ug via INTRAVENOUS

## 2014-10-03 MED ORDER — CEFAZOLIN SODIUM-DEXTROSE 2-3 GM-% IV SOLR
2.0000 g | Freq: Once | INTRAVENOUS | Status: AC
  Administered 2014-10-03: 2 g via INTRAVENOUS
  Filled 2014-10-03: qty 50

## 2014-10-03 MED ORDER — POVIDONE-IODINE 10 % EX SOLN
Freq: Once | CUTANEOUS | Status: AC
  Administered 2014-10-03: 09:00:00 via TOPICAL

## 2014-10-03 MED ORDER — MAGNESIUM HYDROXIDE 400 MG/5ML PO SUSP
30.0000 mL | Freq: Every day | ORAL | Status: DC | PRN
  Administered 2014-10-07: 30 mL via ORAL
  Filled 2014-10-03: qty 30

## 2014-10-03 SURGICAL SUPPLY — 60 items
BAG HAMPER (MISCELLANEOUS) ×3 IMPLANT
BLADE HEX COATED 2.75 (ELECTRODE) ×2 IMPLANT
BLADE SAGITTAL 25.0X1.27X90 (BLADE) ×2 IMPLANT
BLADE SAGITTAL 25.0X1.27X90MM (BLADE) ×1
BLADE SURG SZ20 CARB STEEL (BLADE) ×3 IMPLANT
CHLORAPREP W/TINT 26ML (MISCELLANEOUS) ×4 IMPLANT
CLOTH BEACON ORANGE TIMEOUT ST (SAFETY) ×3 IMPLANT
COVER LIGHT HANDLE STERIS (MISCELLANEOUS) ×6 IMPLANT
COVER MAYO STAND XLG (DRAPE) ×3 IMPLANT
COVER X RAY CASSETTE (MISCELLANEOUS) ×3 IMPLANT
DRAIN PENROSE 18X.75 LTX STRL (MISCELLANEOUS) ×1 IMPLANT
DRAIN PENROSE 18X1/2 LTX STRL (DRAIN) ×2 IMPLANT
DRAPE ORTHO 2.5IN SPLIT 77X108 (DRAPES) ×2 IMPLANT
DRAPE ORTHO SPLIT 77X108 STRL (DRAPES) ×6
DRAPE PROXIMA HALF (DRAPES) ×3 IMPLANT
EVACUATOR 3/16  PVC DRAIN (DRAIN)
EVACUATOR 3/16 PVC DRAIN (DRAIN) ×1 IMPLANT
FACESHIELD LNG OPTICON STERILE (SAFETY) ×3 IMPLANT
FORMALIN 10 PREFIL 480ML (MISCELLANEOUS) ×3 IMPLANT
GAUZE SPONGE 4X4 12PLY STRL (GAUZE/BANDAGES/DRESSINGS) ×3 IMPLANT
GAUZE XEROFORM 5X9 LF (GAUZE/BANDAGES/DRESSINGS) ×3 IMPLANT
GLOVE BIO SURGEON STRL SZ8 (GLOVE) ×3 IMPLANT
GLOVE BIO SURGEON STRL SZ8.5 (GLOVE) ×3 IMPLANT
GLOVE BIOGEL M 7.0 STRL (GLOVE) ×2 IMPLANT
GLOVE BIOGEL PI IND STRL 7.0 (GLOVE) IMPLANT
GLOVE BIOGEL PI INDICATOR 7.0 (GLOVE) ×4
GLOVE EXAM NITRILE MD LF STRL (GLOVE) ×2 IMPLANT
GLOVE SS BIOGEL STRL SZ 6.5 (GLOVE) IMPLANT
GLOVE SUPERSENSE BIOGEL SZ 6.5 (GLOVE) ×2
GOWN STRL REUS W/TWL LRG LVL3 (GOWN DISPOSABLE) ×9 IMPLANT
GOWN STRL REUS W/TWL XL LVL3 (GOWN DISPOSABLE) ×3 IMPLANT
HANDPIECE INTERPULSE COAX TIP (DISPOSABLE)
HIP BIPOLAR TANDEM MED DMND ×2 IMPLANT
INST SET MAJOR BONE (KITS) ×3 IMPLANT
IV NS IRRIG 3000ML ARTHROMATIC (IV SOLUTION) IMPLANT
KIT BLADEGUARD II DBL (SET/KITS/TRAYS/PACK) ×3 IMPLANT
KIT ROOM TURNOVER APOR (KITS) ×3 IMPLANT
MANIFOLD NEPTUNE II (INSTRUMENTS) ×3 IMPLANT
MARKER SKIN DUAL TIP RULER LAB (MISCELLANEOUS) ×3 IMPLANT
NS IRRIG 1000ML POUR BTL (IV SOLUTION) ×3 IMPLANT
PACK TOTAL JOINT (CUSTOM PROCEDURE TRAY) ×3 IMPLANT
PAD ABD 5X9 TENDERSORB (GAUZE/BANDAGES/DRESSINGS) ×6 IMPLANT
PAD ARMBOARD 7.5X6 YLW CONV (MISCELLANEOUS) ×3 IMPLANT
PILLOW HIP ABDUCTION LRG (ORTHOPEDIC SUPPLIES) IMPLANT
PILLOW HIP ABDUCTION MED (ORTHOPEDIC SUPPLIES) ×2 IMPLANT
SET BASIN LINEN APH (SET/KITS/TRAYS/PACK) ×3 IMPLANT
SET HNDPC FAN SPRY TIP SCT (DISPOSABLE) IMPLANT
SPONGE DRAIN TRACH 4X4 STRL 2S (GAUZE/BANDAGES/DRESSINGS) ×1 IMPLANT
SPONGE GAUZE 4X4 12PLY (GAUZE/BANDAGES/DRESSINGS) ×1 IMPLANT
STAPLER VISISTAT 35W (STAPLE) ×5 IMPLANT
SUT BRALON NAB BRD #1 30IN (SUTURE) ×11 IMPLANT
SUT CHROMIC 1 CTX 36 (SUTURE) ×12 IMPLANT
SUT PLAIN 2 0 XLH (SUTURE) ×11 IMPLANT
SUT SILK 0 FSL (SUTURE) ×3 IMPLANT
SWAB COLLECTION DEVICE MRSA (MISCELLANEOUS) ×2 IMPLANT
TAPE MEDIFIX FOAM 3 (GAUZE/BANDAGES/DRESSINGS) ×3 IMPLANT
TOWER CARTRIDGE SMART MIX (DISPOSABLE) IMPLANT
TRAY FOLEY CATH 16FR SILVER (SET/KITS/TRAYS/PACK) ×1 IMPLANT
WATER STERILE IRR 1000ML POUR (IV SOLUTION) ×6 IMPLANT
YANKAUER SUCT 12FT TUBE ARGYLE (SUCTIONS) ×3 IMPLANT

## 2014-10-03 NOTE — Anesthesia Preprocedure Evaluation (Signed)
Anesthesia Evaluation  Patient identified by MRN, date of birth, ID band Patient awake    Reviewed: Allergy & Precautions, NPO status , Patient's Chart, lab work & pertinent test results, reviewed documented beta blocker date and time   Airway Mallampati: II  TM Distance: >3 FB     Dental  (+) Edentulous Upper, Edentulous Lower   Pulmonary former smoker,  breath sounds clear to auscultation        Cardiovascular hypertension, Pt. on medications and Pt. on home beta blockers + Peripheral Vascular Disease Rhythm:Regular Rate:Normal     Neuro/Psych CVA    GI/Hepatic GERD-  ,  Endo/Other  diabetes, Type 2, Oral Hypoglycemic Agents  Renal/GU      Musculoskeletal   Abdominal   Peds  Hematology   Anesthesia Other Findings   Reproductive/Obstetrics                             Anesthesia Physical Anesthesia Plan  ASA: III  Anesthesia Plan: Spinal   Post-op Pain Management:    Induction:   Airway Management Planned: Simple Face Mask  Additional Equipment:   Intra-op Plan:   Post-operative Plan:   Informed Consent: I have reviewed the patients History and Physical, chart, labs and discussed the procedure including the risks, benefits and alternatives for the proposed anesthesia with the patient or authorized representative who has indicated his/her understanding and acceptance.     Plan Discussed with:   Anesthesia Plan Comments:         Anesthesia Quick Evaluation

## 2014-10-03 NOTE — Transfer of Care (Signed)
Immediate Anesthesia Transfer of Care Note  Patient: Marie Hunt  Procedure(s) Performed: Procedure(s): ARTHROPLASTY BIPOLAR HIP (HEMIARTHROPLASTY) (Left)  Patient Location: PACU  Anesthesia Type:Spinal  Level of Consciousness: awake, alert  and patient cooperative  Airway & Oxygen Therapy: Patient Spontanous Breathing and Patient connected to nasal cannula oxygen  Post-op Assessment: Report given to RN and Post -op Vital signs reviewed and stable T 8  Post vital signs: Reviewed and stable    Complications: No apparent anesthesia complications

## 2014-10-03 NOTE — Progress Notes (Signed)
Pts BP 185/61 HR 70. Notified MD, verbal order given to administer Amlodipine 5mg  and Metoprolol 25mg  now.

## 2014-10-03 NOTE — Consult Note (Signed)
CARDIOLOGY CONSULT NOTE   Patient ID: KINDEL WINKFIELD MRN: [id number] DOB/AGE: [birth year] 81 y.o.  Admit Date: 10/02/2014 Referring Physician: Luna Glasgow MD Primary Physician: Monico Blitz, MD Consulting Cardiologist: Kate Sable MD Primary Cardiologist: Rozann Lesches [facility]) Reason for Consultation: Pre-Operative Evaluation for hip repair.  Clinical Summary Ms. Waits is a 81 y.o.female history of carotid artery disease, prior CVA, hypertension, hyperlipidemia, and Type lI diabetes who has not been seen by cardiology since 2012, had low risk pharmacologic stress test 11/12/2010. She was admitted after sustaining left hip subcapital fx after a mechanical fall. We are asked for cardiac evaluation prior to left hip repair.   On arrival to ER, BP 157/64; HR 63, 99% sat, afebrile. Hgb 10.5/Hct 10.5; Na 143, Glucose 117, Creatinine 2.26, BUN 31. EKG, NSR, with PAC's and LVH.   She denies any aura, pre-syncope, or dizziness prior to fall. She states she tripped in her bathroom. Was able to get back up by holding onto the sink. She had right eye cataract surgery earlier in the day.  Denies history of chest pain, palpitations or dyspnea. 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 She does this part time to keep busy, and prevent herself from "sitting around."  Allergies  Allergen Reactions  . Codeine Nausea And Vomiting    Medications Scheduled Medications: . sodium chloride   Intravenous Once  . amLODipine  5 mg Oral Daily  .  ceFAZolin (ANCEF) IV  2 g Intravenous Once  . insulin aspart  0-9 Units Subcutaneous 6 times per day  . metoprolol succinate  25 mg Oral Daily    Infusions: . lactated ringers 75 mL/hr at 10/03/14 0010    PRN Medications: HYDROcodone-acetaminophen, methocarbamol **OR** methocarbamol (ROBAXIN)  IV, morphine injection, ondansetron  (ZOFRAN) IV   Past Medical History  Diagnosis Date  . GERD (gastroesophageal reflux disease)   . Carotid artery disease     123456 RICA, patent LICA [id number] - Dr. Scot Dock  . Essential hypertension, benign   . Type 2 diabetes mellitus   . Mixed hyperlipidemia   . Sigmoid diverticulosis   . Cholelithiasis   . Diabetes mellitus     Type II  . Cholelithiasis     In need of cholecystectomy  . Sigmoid diverticulosis   . GERD (gastroesophageal reflux disease)   . Anemia     Past Surgical History  Procedure Laterality Date  . Left carotid endarterectomy  7/11    Dr. Scot Dock  . Carotid endarterectomy  08/24/2009    Left catroid endarterectomy  . Cataract extraction w/phaco Left 08/17/2014    Procedure: CATARACT EXTRACTION PHACO AND INTRAOCULAR LENS PLACEMENT; CDE:  7.39;  Surgeon: Tonny Branch, MD;  Location: [facility];  Service: Ophthalmology;  Laterality: Left;  Marland Kitchen Eye surgery      Family History  Problem Relation Age of Onset  . Coronary artery disease    . Heart disease Sister   . Heart disease Sister   . Heart disease Sister   . Diabetes Mother   . Diabetes Father   . Heart attack Daughter   . Other Daughter     Bleeding problems    Social History Ms. Gad reports that she quit smoking about 2 years ago. Her smoking use included Cigarettes. She has a 18 pack-year smoking history. She has never used smokeless tobacco. Ms. Finnie reports that she does  not drink alcohol.  Review of Systems Complete review of systems are found to be negative unless outlined in H&P above.  Physical Examination Blood pressure 146/56, pulse 61, temperature 98.3 F (36.8 C), temperature source Oral, resp. rate 16, height 5\' 3"  (1.6 m), weight 143 lb 4.8 oz (65 kg), SpO2 97 %.  Intake/Output Summary (Last 24 hours) at 10/03/14 0935 Last data filed at 10/03/14 0700  Gross per 24 hour  Intake  512.5 ml  Output      0 ml  Net  512.5 ml    GEN: No acute distress HEENT: Conjunctiva and  lids normal, oropharynx clear with moist mucosa. Neck: Supple, no elevated JVP or carotid bruits, no thyromegaly. Lungs: Clear to auscultation, nonlabored breathing at rest. Cardiac: Regular rate and [id number] systolic murmur at both the LSB at the pulmonic area and RSB, at the aortic area, with preserved S2, , no pericardial rub. Right carotid bruit, none on the left.  Abdomen: Soft, nontender, no hepatomegaly, bowel sounds present, no guarding or rebound. Extremities: No pitting edema, distal pulses 2+. Skin: Warm and dry. Musculoskeletal: No kyphosis. Neuropsychiatric: Alert and oriented x3, affect grossly appropriate.  Prior Cardiac Testing/Procedures  1.Myoview 10/2010-Low risk  2. Echocardiogram 06/2009   Normal appearing resting LV fx, Mild LVH, Diastolic dysfunction, Mild aortic valve sclerosis, with decreased excursion of non-coronary cusp. Mild mitral valve leaflet thickening. Mild aortic and tricuspid regurgitation. Trace mitral regurgitation suspected. Minimal increased velocity across pulmonic valve-cannot exclude minimal pulmonic stenosis.   Lab Results  Basic Metabolic Panel:  Recent Labs Lab 10/02/14 2000 10/03/14 0613  NA 143 141  K 4.0 4.0  CL 102 103  CO2 25 30  GLUCOSE 117* 131*  BUN 31* 29*  CREATININE 2.26* 2.21*  CALCIUM 9.9 9.1    CBC:  Recent Labs Lab 10/02/14 2000 10/03/14 0613  WBC 12.8* 9.3  NEUTROABS 9.2*  --   HGB 10.5* 9.4*  HCT 32.6* 29.2*  MCV 93.1 93.0  PLT 176 158    Radiology: Dg Chest 1 View  10/02/2014   CLINICAL DATA:  Status post slip and fall today.  EXAM: CHEST  1 VIEW  COMPARISON:  PA and lateral chest 08/24/2009.  FINDINGS: The lungs are clear. Heart size is normal. No pneumothorax or pleural effusion. No focal bony abnormality.  IMPRESSION: No acute disease.   Electronically Signed   By: Inge Rise M.D.   On: 10/02/2014 19:54   Dg Knee Complete 4 Views Left  10/02/2014   CLINICAL DATA:  Pain following fall  EXAM:  LEFT KNEE - COMPLETE 4+ VIEW  COMPARISON:  None.  FINDINGS: Frontal, bilateral oblique, and lateral images were obtained. There is no demonstrable fracture or dislocation. No appreciable joint effusion. There is moderate generalized joint space narrowing. No erosive change. There are foci of atherosclerotic calcification.  IMPRESSION: Generalized osteoarthritic change. No fracture or joint effusion. Areas of atherosclerotic calcification.   Electronically Signed   By: Lowella Grip III M.D.   On: 10/02/2014 19:54   Dg Hip Unilat With Pelvis 2-3 Views Left  10/02/2014   CLINICAL DATA:  Status post fall today. Left hip pain. Initial encounter.  EXAM: DG HIP (WITH OR WITHOUT PELVIS) 2-3V LEFT  COMPARISON:  None.  FINDINGS: The patient has an acute subcapital left hip fracture. No other acute bony or joint abnormality is identified. Bones are osteopenic. Lower lumbar spondylosis is noted.  IMPRESSION: Subcapital fracture left hip.   Electronically Signed   By: Marcello Moores  Dalessio M.D.   On: 10/02/2014 19:18     ECG: NSR with LVH.    Impression and Recommendations  1. Hypertension: BP is well controlled on amlodipine, and metoprolol. Continue both peri-operatively. Will repeat echo for changes in LV fx and for valvular assessment. This will help direct post-operative medications if necessary.   2. Left hip subcapital fx:  Plans for surgical repair this afternoon. EKG does not demonstrate any acute ST/T wave changes. She is overall low risk for this procedure from cardiac standpoint. Echo will be completed for ongoing evaluation of cardiac status to assist in peri-operative care.    Signed: Phill Myron. Lawrence NP [facility]  10/03/2014, 9:35 AM Co-Sign MD  The patient was seen and examined, and I agree with the assessment and plan as documented above, with modifications as noted below. Pt with aforementioned history admitted with left hip fracture and we are consulted for perioperative risk  stratification. Saw Dr. Domenic Polite for preoperative assessment in 2012 for elective cholecystectomy, although she never proceeded with it. Underwent low risk nuclear stress test at that time. Prior echocardiogram results noted above. Given her history of carotid artery disease, CVA, and hypertension, she is in the low-intermediate risk category for a major adverse cardiac event in the perioperative period. That being said, she denies exertional chest pain and dyspnea, as well as palpitations, orthopnea, and paroxysmal nocturnal dyspnea. ECG is unremarkable (no ischemic ST-T abnormalities).  I feel she can proceed with surgery as planned with no need for an ischemic evaluation. Will obtain an echocardiogram to reassess valvular heart disease but this should not preclude surgery as her murmur is not consistent with severe valvular heart disease.

## 2014-10-03 NOTE — Clinical Social Work Note (Signed)
CSW received consult for SNF. Pt is scheduled for surgery soon. Will plan to see pt tomorrow.    Benay Pike, [place]

## 2014-10-03 NOTE — Anesthesia Procedure Notes (Addendum)
Procedure Name: MAC Date/Time: 10/03/2014 12:54 PM Performed by: Vista Deck Pre-anesthesia Checklist: Patient identified, Emergency Drugs available, Suction available, Timeout performed and Patient being monitored Patient Re-evaluated:Patient Re-evaluated prior to inductionOxygen Delivery Method: Non-rebreather mask   Spinal Patient location during procedure: OR Start time: 10/03/2014 1:11 PM End time: 10/03/2014 1:16 PM Staffing Resident/CRNA: Drucie Opitz S Preanesthetic Checklist Completed: patient identified, site marked, surgical consent, pre-op evaluation, timeout performed, IV checked, risks and benefits discussed and monitors and equipment checked Spinal Block Patient position: left lateral decubitus Prep: Betadine Patient monitoring: heart rate, cardiac monitor, continuous pulse ox and blood pressure Approach: left paramedian Location: L3-4 Injection technique: single-shot Needle Needle type: Spinocan  Needle gauge: 22 G Needle length: 9 cm Assessment Sensory level: T6 Additional Notes Betadine prep x3 1% lidocaine skinwheal  Clear CSF pre and post injection  ATTEMPTS:1 TRAY ID: [id number] TRAY EXPIRATION DATE: 2017-07

## 2014-10-03 NOTE — Consult Note (Signed)
Reason for Consult:Fracture of the left hip Referring Physician: [facility]  Marie Hunt is an 81 y.o. female.  HPI: She fell last night at her home.  She lives alone.  She had cataract surgery yesterday here by Dr. Geoffry Paradise.  It went well.  She slipped and fell last night but had no vision problem prior or post fall.  She had pain in the left hip, unable to stand.  She has subcapital fracture of the left hip and no other injury.  I have talked to her and her granddaughter and her daughter (daughter on speaker phone) about the x-ray findings of a fracture of the left hip.  I have recommended bipolar hip replacement.  I went over the risks and imponderables of the surgery including infection, possible embolus, possible nerve injury,  need for post operative blood thinner, need for physical therapy and a walker after surgery, strong recommendation for skilled nursing home placement after surgery, possible blood transfusion (she will get a unit today prior to surgery), and anesthesia risks.  I recommend spinal anesthesia.  They asked appropriate questions and appear to understand.  I will try to post around lunch today schedule permitting.  Past Medical History  Diagnosis Date  . GERD (gastroesophageal reflux disease)   . Carotid artery disease     6-96% RICA, patent LICA 2/95 - Dr. Scot Dock  . Essential hypertension, benign   . Type 2 diabetes mellitus   . Mixed hyperlipidemia   . Sigmoid diverticulosis   . Cholelithiasis   . Diabetes mellitus     Type II  . Cholelithiasis     In need of cholecystectomy  . Sigmoid diverticulosis   . GERD (gastroesophageal reflux disease)   . Anemia     Past Surgical History  Procedure Laterality Date  . Left carotid endarterectomy  7/11    Dr. Scot Dock  . Carotid endarterectomy  08/24/2009    Left catroid endarterectomy  . Cataract extraction w/phaco Left 08/17/2014    Procedure: CATARACT EXTRACTION PHACO AND INTRAOCULAR LENS PLACEMENT; CDE:  7.39;   Surgeon: Tonny Branch, MD;  Location: [facility];  Service: Ophthalmology;  Laterality: Left;  [facility] Eye surgery      Family History  Problem Relation Age of Onset  . Coronary artery disease    . Heart disease Sister   . Heart disease Sister   . Heart disease Sister   . Diabetes Mother   . Diabetes Father   . Heart attack Daughter   . Other Daughter     Bleeding problems    Social History:  reports that she quit smoking about 2 years ago. Her smoking use included Cigarettes. She has a 18 pack-year smoking history. She has never used smokeless tobacco. She reports that she does not drink alcohol or use illicit drugs.  Allergies:  Allergies  Allergen Reactions  . Codeine Nausea And Vomiting    Medications: I have reviewed the patient's current medications.  Results for orders placed or performed during the hospital encounter of 10/02/14 (from the past 48 hour(s))  CBC with Differential/Platelet     Status: Abnormal   Collection Time: 10/02/14  8:00 PM  Result Value Ref Range   WBC 12.8 (H) 4.0 - 10.5 K/uL   RBC 3.50 (L) 3.87 - 5.11 MIL/uL   Hemoglobin 10.5 (L) 12.0 - 15.0 g/dL   HCT 32.6 (L) 36.0 - 46.0 %   MCV 93.1 78.0 - 100.0 fL   MCH 30.0 26.0 - 34.0 pg  MCHC 32.2 30.0 - 36.0 g/dL   RDW 12.7 11.5 - 15.5 %   Platelets 176 150 - 400 K/uL   Neutrophils Relative % 72 43 - 77 %   Neutro Abs 9.2 (H) 1.7 - 7.7 K/uL   Lymphocytes Relative 21 12 - 46 %   Lymphs Abs 2.7 0.7 - 4.0 K/uL   Monocytes Relative 5 3 - 12 %   Monocytes Absolute 0.6 0.1 - 1.0 K/uL   Eosinophils Relative 2 0 - 5 %   Eosinophils Absolute 0.3 0.0 - 0.7 K/uL   Basophils Relative 0 0 - 1 %   Basophils Absolute 0.0 0.0 - 0.1 K/uL  Basic metabolic panel     Status: Abnormal   Collection Time: 10/02/14  8:00 PM  Result Value Ref Range   Sodium 143 135 - 145 mmol/L   Potassium 4.0 3.5 - 5.1 mmol/L   Chloride 102 101 - 111 mmol/L   CO2 25 22 - 32 mmol/L   Glucose, Bld 117 (H) 65 - 99 mg/dL   BUN 31 (H) 6 - 20  mg/dL   Creatinine, Ser 2.26 (H) 0.44 - 1.00 mg/dL   Calcium 9.9 8.9 - 10.3 mg/dL   GFR calc non Af Amer 19 (L) >60 mL/min   GFR calc Af Amer 23 (L) >60 mL/min    Comment: (NOTE) The eGFR has been calculated using the CKD EPI equation. This calculation has not been validated in all clinical situations. eGFR's persistently <60 mL/min signify possible Chronic Kidney Disease.    Anion gap 16 (H) 5 - 15  Type and screen     Status: None   Collection Time: 10/02/14  8:00 PM  Result Value Ref Range   ABO/RH(D) O POS    Antibody Screen NEG    Sample Expiration 10/05/2014   Protime-INR     Status: None   Collection Time: 10/02/14  8:00 PM  Result Value Ref Range   Prothrombin Time 14.0 11.6 - 15.2 seconds   INR 1.06 0.00 - 1.49  Urinalysis, Routine w reflex microscopic (not at [facility])     Status: Abnormal   Collection Time: 10/02/14  8:29 PM  Result Value Ref Range   Color, Urine YELLOW YELLOW   APPearance CLEAR CLEAR   Specific Gravity, Urine 1.010 1.005 - 1.030   pH 6.0 5.0 - 8.0   Glucose, UA NEGATIVE NEGATIVE mg/dL   Hgb urine dipstick TRACE (A) NEGATIVE   Bilirubin Urine NEGATIVE NEGATIVE   Ketones, ur NEGATIVE NEGATIVE mg/dL   Protein, ur TRACE (A) NEGATIVE mg/dL   Urobilinogen, UA 0.2 0.0 - 1.0 mg/dL   Nitrite NEGATIVE NEGATIVE   Leukocytes, UA NEGATIVE NEGATIVE  Urine microscopic-add on     Status: None   Collection Time: 10/02/14  8:29 PM  Result Value Ref Range   Squamous Epithelial / LPF RARE RARE   RBC / HPF 0-2 <3 RBC/hpf  Glucose, capillary     Status: Abnormal   Collection Time: 10/03/14 12:17 AM  Result Value Ref Range   Glucose-Capillary 165 (H) 65 - 99 mg/dL  Glucose, capillary     Status: Abnormal   Collection Time: 10/03/14  4:19 AM  Result Value Ref Range   Glucose-Capillary 161 (H) 65 - 99 mg/dL  CBC     Status: Abnormal   Collection Time: 10/03/14  6:13 AM  Result Value Ref Range   WBC 9.3 4.0 - 10.5 K/uL   RBC 3.14 (L) 3.87 - 5.11 MIL/uL  Hemoglobin 9.4 (L) 12.0 - 15.0 g/dL   HCT 29.2 (L) 36.0 - 46.0 %   MCV 93.0 78.0 - 100.0 fL   MCH 29.9 26.0 - 34.0 pg   MCHC 32.2 30.0 - 36.0 g/dL   RDW 12.9 11.5 - 15.5 %   Platelets 158 150 - 400 K/uL  Basic metabolic panel     Status: Abnormal   Collection Time: 10/03/14  6:13 AM  Result Value Ref Range   Sodium 141 135 - 145 mmol/L   Potassium 4.0 3.5 - 5.1 mmol/L   Chloride 103 101 - 111 mmol/L   CO2 30 22 - 32 mmol/L   Glucose, Bld 131 (H) 65 - 99 mg/dL   BUN 29 (H) 6 - 20 mg/dL   Creatinine, Ser 2.21 (H) 0.44 - 1.00 mg/dL   Calcium 9.1 8.9 - 10.3 mg/dL   GFR calc non Af Amer 20 (L) >60 mL/min   GFR calc Af Amer 23 (L) >60 mL/min    Comment: (NOTE) The eGFR has been calculated using the CKD EPI equation. This calculation has not been validated in all clinical situations. eGFR's persistently <60 mL/min signify possible Chronic Kidney Disease.    Anion gap 8 5 - 15    Dg Chest 1 View  10/02/2014   CLINICAL DATA:  Status post slip and fall today.  EXAM: CHEST  1 VIEW  COMPARISON:  PA and lateral chest 08/24/2009.  FINDINGS: The lungs are clear. Heart size is normal. No pneumothorax or pleural effusion. No focal bony abnormality.  IMPRESSION: No acute disease.   Electronically Signed   By: Inge Rise M.D.   On: 10/02/2014 19:54   Dg Knee Complete 4 Views Left  10/02/2014   CLINICAL DATA:  Pain following fall  EXAM: LEFT KNEE - COMPLETE 4+ VIEW  COMPARISON:  None.  FINDINGS: Frontal, bilateral oblique, and lateral images were obtained. There is no demonstrable fracture or dislocation. No appreciable joint effusion. There is moderate generalized joint space narrowing. No erosive change. There are foci of atherosclerotic calcification.  IMPRESSION: Generalized osteoarthritic change. No fracture or joint effusion. Areas of atherosclerotic calcification.   Electronically Signed   By: Lowella Grip III M.D.   On: 10/02/2014 19:54   Dg Hip Unilat With Pelvis 2-3 Views  Left  10/02/2014   CLINICAL DATA:  Status post fall today. Left hip pain. Initial encounter.  EXAM: DG HIP (WITH OR WITHOUT PELVIS) 2-3V LEFT  COMPARISON:  None.  FINDINGS: The patient has an acute subcapital left hip fracture. No other acute bony or joint abnormality is identified. Bones are osteopenic. Lower lumbar spondylosis is noted.  IMPRESSION: Subcapital fracture left hip.   Electronically Signed   By: Inge Rise M.D.   On: 10/02/2014 19:18    Review of Systems  Cardiovascular:       History of hypertension, carotid artery disease, hyperlipidemia. History of murmur.  Gastrointestinal:       GERD  Musculoskeletal: Positive for falls (Fell at home last night.  Hurt left hip.).  Endo/Heme/Allergies:       Type 2 diabetes mellitus   Blood pressure 146/56, pulse 61, temperature 98.3 F (36.8 C), temperature source Oral, resp. rate 16, height 5' 3" (1.6 m), weight 65 kg (143 lb 4.8 oz), SpO2 97 %. Physical Exam  Constitutional: She is oriented to person, place, and time. She appears well-developed and well-nourished.  HENT:  Head: Normocephalic and atraumatic.  Eyes: Conjunctivae and EOM are normal. Pupils are  equal, round, and reactive to light.  Bilateral cataracts.  Neck: Normal range of motion. Neck supple.  Cardiovascular: Normal rate, regular rhythm and intact distal pulses.   Respiratory: Effort normal.  GI: Soft.  Musculoskeletal: She exhibits tenderness (pain of the left hip, slight shortening present.  She wants to internally rotate the hip.  NV is intact.).  Neurological: She is alert and oriented to person, place, and time. She has normal reflexes.  Skin: Skin is warm and dry.  Psychiatric: She has a normal mood and affect. Her behavior is normal. Judgment and thought content normal.    Assessment/Plan: Subcapital fracture of the left hip, for bipolar hip replacement later today. Surgery yesterday for cataract Type 2 diabetes  mellitus Hypertension GERD  , 10/03/2014, 7:29 AM

## 2014-10-03 NOTE — Anesthesia Postprocedure Evaluation (Addendum)
  Anesthesia Post-op Note  Patient: Marie Hunt  Procedure(s) Performed: Procedure(s): ARTHROPLASTY BIPOLAR HIP (HEMIARTHROPLASTY) (Left)  Patient Location: PACU  Anesthesia Type:Spinal  Level of Consciousness: awake and alert   Airway and Oxygen Therapy: Patient Spontanous Breathing and Patient connected to nasal cannula oxygen  Post-op Pain: none  Post-op Assessment: Post-op Vital signs reviewed, Patient's Cardiovascular Status Stable, Respiratory Function Stable, Patent Airway and No signs of Nausea or vomiting T 10              Post-op Vital Signs: Reviewed and stable    Complications: No apparent anesthesia complications

## 2014-10-03 NOTE — Progress Notes (Signed)
[facility] PROGRESS NOTE  EVALYSE TACKITT [id number] DOB: November 15, 1932 DOA: 10/02/2014 PCP: Monico Blitz, MD  Assessment/Plan: 1. Left hip fracture, secondary to mechanical fall. Evaluated by Dr. Luna Glasgow, Orthopedic surgeon, who plans to go to OR 10/03/14, for bipolar hip replacement Will continue to hold Lovenox and keep NPO. 2.  HTN. Currently well controlled on home medications.  3. CAD. Seen by cardiology who reports she overall low risk for this procedure from cardiac stand point. ECHO will be completed for ongoing evaluation to assist in peri-operative care. No reports of chest pain at this time.  4. DM type 2. Continue SSI. 5. Chronic anemia. HGB appears near baseline will follow closely in the setting of surgery. She is currently receiving 1 unit of PRBC in order to optimize for surgery.  6. Possible CKD Stage IV. Creatinine is stable at this time will request records to determine baseline. Will continue to follow renal function.   Code Status: Full DVT prophylaxis: SCDs  Family Communication: Family member bedside. Spoke with patient and she understands care plan with no concerns at this time. Disposition Plan: Discharge pending improvement.    Consultants:  Orthopedic surgery- Dr. Luna Glasgow  Cardiology- Herminio Commons, MD  Procedures:  Transfusion- 1 unit PRBC  Antibiotics:    HPI/Subjective: Yesterday while walking in the bathroom, she reports losing her balance and falling.She denies any LOC or syncope. Pain is currently improving but she is experiencing intermittent nausea. No reports of chest pain, shortness of breath, vomiting. diarrhea or abd pain.  Objective: Filed Vitals:   10/03/14 0619  BP: 146/56  Pulse: 61  Temp: 98.3 F (36.8 C)  Resp: 16    Intake/Output Summary (Last 24 hours) at 10/03/14 0734 Last data filed at 10/03/14 0700  Gross per 24 hour  Intake  512.5 ml  Output      0 ml  Net  512.5 ml   Filed Weights   10/02/14 1749  10/02/14 2314  Weight: 66.225 kg (146 lb) 65 kg (143 lb 4.8 oz)    Exam: General:  Appears calm and comfortable, lying in bed. Cardiovascular: RRR, no rubs or gallops. 3/6 SEM. Respiratory: CTA bilaterally, no w/r/r. Normal respiratory effort. Abdomen: soft, non tender, nd Skin: no rash or induration seen on limited exam Musculoskeletal: LLE mild edema. DP and PT pulses 2+ Psychiatric: grossly normal mood and affect, speech fluent and appropriate Neurologic: grossly non-focal  Data Reviewed: Basic Metabolic Panel:  Recent Labs Lab 10/02/14 2000 10/03/14 0613  NA 143 141  K 4.0 4.0  CL 102 103  CO2 25 30  GLUCOSE 117* 131*  BUN 31* 29*  CREATININE 2.26* 2.21*  CALCIUM 9.9 9.1   Liver Function Tests: No results for input(s): AST, ALT, ALKPHOS, BILITOT, PROT, ALBUMIN in the last 168 hours. No results for input(s): LIPASE, AMYLASE in the last 168 hours. No results for input(s): AMMONIA in the last 168 hours. CBC:  Recent Labs Lab 10/02/14 2000 10/03/14 0613  WBC 12.8* 9.3  NEUTROABS 9.2*  --   HGB 10.5* 9.4*  HCT 32.6* 29.2*  MCV 93.1 93.0  PLT 176 158    CBG:  Recent Labs Lab 10/02/14 0927 10/03/14 0017 10/03/14 0419  GLUCAP 107* 165* 161*    No results found for this or any previous visit (from the past 240 hour(s)).   Studies: Dg Chest 1 View  10/02/2014   CLINICAL DATA:  Status post slip and fall today.  EXAM: CHEST  1 VIEW  COMPARISON:  PA and lateral chest 08/24/2009.  FINDINGS: The lungs are clear. Heart size is normal. No pneumothorax or pleural effusion. No focal bony abnormality.  IMPRESSION: No acute disease.   Electronically Signed   By: Inge Rise M.D.   On: 10/02/2014 19:54   Dg Knee Complete 4 Views Left  10/02/2014   CLINICAL DATA:  Pain following fall  EXAM: LEFT KNEE - COMPLETE 4+ VIEW  COMPARISON:  None.  FINDINGS: Frontal, bilateral oblique, and lateral images were obtained. There is no demonstrable fracture or dislocation. No  appreciable joint effusion. There is moderate generalized joint space narrowing. No erosive change. There are foci of atherosclerotic calcification.  IMPRESSION: Generalized osteoarthritic change. No fracture or joint effusion. Areas of atherosclerotic calcification.   Electronically Signed   By: Lowella Grip III M.D.   On: 10/02/2014 19:54   Dg Hip Unilat With Pelvis 2-3 Views Left  10/02/2014   CLINICAL DATA:  Status post fall today. Left hip pain. Initial encounter.  EXAM: DG HIP (WITH OR WITHOUT PELVIS) 2-3V LEFT  COMPARISON:  None.  FINDINGS: The patient has an acute subcapital left hip fracture. No other acute bony or joint abnormality is identified. Bones are osteopenic. Lower lumbar spondylosis is noted.  IMPRESSION: Subcapital fracture left hip.   Electronically Signed   By: Inge Rise M.D.   On: 10/02/2014 19:18    Scheduled Meds: . amLODipine  5 mg Oral Daily  . insulin aspart  0-9 Units Subcutaneous 6 times per day  . metoprolol succinate  25 mg Oral Daily   Continuous Infusions: . lactated ringers 75 mL/hr at 10/03/14 0010    Principal Problem:   Hip fracture Active Problems:   Hypertension   Carotid artery disease   Type 2 diabetes mellitus   Hip fx    Time spent: 30 minutes.     [name], MD [facility] Pager [phone]. If 7PM-7AM, please contact night-coverage at [email or web address], password [facility] 10/03/2014, 7:34 AM  LOS: 1 day     I, Jessica D. Leonie Green, acting as scribe, recorded this note contemporaneously in the presence of Dr. [name], M.D. on 10/03/2014.   Attending:  I have reviewed the above documentation for accuracy and completeness, and I agree with the above.  MEMON,JEHANZEB

## 2014-10-03 NOTE — Brief Op Note (Signed)
10/02/2014 - 10/03/2014  2:49 PM  PATIENT:  Marie Hunt  81 y.o. female  PRE-OPERATIVE DIAGNOSIS:  Fracture Left Hip  POST-OPERATIVE DIAGNOSIS:  Fracture Left Hip  PROCEDURE:  Procedure(s): ARTHROPLASTY BIPOLAR HIP (HEMIARTHROPLASTY) (Left)  SURGEON:  Surgeon(s) and Role:    * Sanjuana Kava, MD - Primary  PHYSICIAN ASSISTANT:   ASSISTANTS: C. Page, RN   ANESTHESIA:   spinal  EBL:  Total I/O In: [id number] [I.V.:1050; Blood:365] Out: 1300 [Urine:1150; Blood:150]  BLOOD ADMINISTERED:none  DRAINS: (Large) Hemovact drain(s) in the left hip area with  Suction Open   LOCAL MEDICATIONS USED:  NONE  SPECIMEN:  Source of Specimen:  Left femoral head  DISPOSITION OF SPECIMEN:  PATHOLOGY  COUNTS:  YES  TOURNIQUET:  * No tourniquets in log *  DICTATION: .Other Dictation: Dictation Number Typed myself as an operative note on 10-04-14  PLAN OF CARE: Admit to inpatient   PATIENT DISPOSITION:  PACU - hemodynamically stable.   Delay start of Pharmacological VTE agent (>24hrs) due to surgical blood loss or risk of bleeding: no

## 2014-10-04 ENCOUNTER — Encounter (HOSPITAL_COMMUNITY): Payer: Self-pay | Admitting: Orthopaedic Surgery

## 2014-10-04 ENCOUNTER — Inpatient Hospital Stay (HOSPITAL_COMMUNITY): Payer: Medicare Other

## 2014-10-04 DIAGNOSIS — S72001D Fracture of unspecified part of neck of right femur, subsequent encounter for closed fracture with routine healing: Secondary | ICD-10-CM

## 2014-10-04 DIAGNOSIS — D649 Anemia, unspecified: Secondary | ICD-10-CM

## 2014-10-04 LAB — CBC WITH DIFFERENTIAL/PLATELET
BASOS ABS: 0 10*3/uL (ref 0.0–0.1)
BASOS PCT: 0 % (ref 0–1)
EOS PCT: 0 % (ref 0–5)
Eosinophils Absolute: 0 10*3/uL (ref 0.0–0.7)
HCT: 30.3 % — ABNORMAL LOW (ref 36.0–46.0)
HEMOGLOBIN: 9.8 g/dL — AB (ref 12.0–15.0)
LYMPHS PCT: 12 % (ref 12–46)
Lymphs Abs: 1.5 10*3/uL (ref 0.7–4.0)
MCH: 29.8 pg (ref 26.0–34.0)
MCHC: 32.3 g/dL (ref 30.0–36.0)
MCV: 92.1 fL (ref 78.0–100.0)
MONO ABS: 0.7 10*3/uL (ref 0.1–1.0)
Monocytes Relative: 6 % (ref 3–12)
Neutro Abs: 10.1 10*3/uL — ABNORMAL HIGH (ref 1.7–7.7)
Neutrophils Relative %: 82 % — ABNORMAL HIGH (ref 43–77)
PLATELETS: 153 10*3/uL (ref 150–400)
RBC: 3.29 MIL/uL — ABNORMAL LOW (ref 3.87–5.11)
RDW: 13.1 % (ref 11.5–15.5)
WBC: 12.4 10*3/uL — AB (ref 4.0–10.5)

## 2014-10-04 LAB — GLUCOSE, CAPILLARY
GLUCOSE-CAPILLARY: 174 mg/dL — AB (ref 65–99)
Glucose-Capillary: 165 mg/dL — ABNORMAL HIGH (ref 65–99)
Glucose-Capillary: 167 mg/dL — ABNORMAL HIGH (ref 65–99)
Glucose-Capillary: 206 mg/dL — ABNORMAL HIGH (ref 65–99)
Glucose-Capillary: 218 mg/dL — ABNORMAL HIGH (ref 65–99)
Glucose-Capillary: 220 mg/dL — ABNORMAL HIGH (ref 65–99)

## 2014-10-04 LAB — URINE CULTURE: Culture: NO GROWTH

## 2014-10-04 LAB — BASIC METABOLIC PANEL
ANION GAP: 13 (ref 5–15)
BUN: 24 mg/dL — ABNORMAL HIGH (ref 6–20)
CO2: 26 mmol/L (ref 22–32)
Calcium: 8.9 mg/dL (ref 8.9–10.3)
Chloride: 100 mmol/L — ABNORMAL LOW (ref 101–111)
Creatinine, Ser: 1.88 mg/dL — ABNORMAL HIGH (ref 0.44–1.00)
GFR calc Af Amer: 28 mL/min — ABNORMAL LOW (ref >60)
GFR, EST NON AFRICAN AMERICAN: 24 mL/min — AB (ref >60)
Glucose, Bld: 211 mg/dL — ABNORMAL HIGH (ref 65–99)
Potassium: 3.7 mmol/L (ref 3.5–5.1)
SODIUM: 139 mmol/L (ref 135–145)

## 2014-10-04 NOTE — Progress Notes (Signed)
Subjective: 1 Day Post-Op Procedure(s) (LRB): ARTHROPLASTY BIPOLAR HIP (HEMIARTHROPLASTY) (Left) Patient reports pain as mild.    Objective: Vital signs in last 24 hours: Temp:  [97.5 F (36.4 C)-99.2 F (37.3 C)] 99.2 F (37.3 C) (08/10 0000) Pulse Rate:  [57-100] 91 (08/10 0430) Resp:  [9-25] 16 (08/10 0449) BP: (139-203)/(48-112) 166/50 mmHg (08/10 0430) SpO2:  [92 %-100 %] 98 % (08/10 0449) Weight:  [64.864 kg (143 lb)] 64.864 kg (143 lb) (08/09 1230)  Intake/Output from previous day: 08/09 0701 - 08/10 0700 In: 2504 [I.V.:2139; Blood:365] Out: 2280 [Urine:1900; Drains:230; Blood:150] Intake/Output this shift:     Recent Labs  10/02/14 2000 10/03/14 0613 10/03/14 1505 10/04/14 0629  HGB 10.5* 9.4* 10.4* 9.8*    Recent Labs  10/03/14 0613 10/03/14 1505 10/04/14 0629  WBC 9.3  --  12.4*  RBC 3.14*  --  3.29*  HCT 29.2* 31.8* 30.3*  PLT 158  --  153    Recent Labs  10/03/14 0613 10/04/14 0629  NA 141 139  K 4.0 3.7  CL 103 100*  CO2 30 26  BUN 29* 24*  CREATININE 2.21* 1.88*  GLUCOSE 131* 211*  CALCIUM 9.1 8.9    Recent Labs  10/02/14 2000  INR 1.06    Neurologically intact Neurovascular intact Sensation intact distally Intact pulses distally Dorsiflexion/Plantar flexion intact   She got confused during the night.  Plan for PT today.    Assessment/Plan: 1 Day Post-Op Procedure(s) (LRB): ARTHROPLASTY BIPOLAR HIP (HEMIARTHROPLASTY) (Left) Up with therapy  Eara Burruel 10/04/2014, 8:04 AM

## 2014-10-04 NOTE — Addendum Note (Signed)
Addendum  created 10/04/14 1247 by Mickel Baas, CRNA   Modules edited: Notes Section   Notes Section:  File: [id number]

## 2014-10-04 NOTE — Clinical Social Work Placement (Signed)
   CLINICAL SOCIAL WORK PLACEMENT  NOTE  Date:  10/04/2014  Patient Details  Name: Marie Hunt MRN: [id number] Date of Birth: [birth year]  Clinical Social Work is seeking post-discharge placement for this patient at the [place] level of care (*CSW will initial, date and re-position this form in  chart as items are completed):  Yes   Patient/family provided with [place] Work Department's list of facilities offering this level of care within the geographic area requested by the patient (or if unable, by the patient's family).  Yes   Patient/family informed of their freedom to choose among providers that offer the needed level of care, that participate in Medicare, Medicaid or managed care program needed by the patient, have an available bed and are willing to accept the patient.  Yes   Patient/family informed of [place]'s ownership interest in [facility] and [facility], as well as of the fact that they are under no obligation to receive care at these facilities.  PASRR submitted to EDS on 10/03/14     PASRR number received on 10/03/14     Existing PASRR number confirmed on       FL2 transmitted to all facilities in geographic area requested by pt/family on 10/04/14     FL2 transmitted to all facilities within larger geographic area on       Patient informed that his/her managed care company has contracts with or will negotiate with certain facilities, including the following:        Yes   Patient/family informed of bed offers received.  Patient chooses bed at [facility]     Physician recommends and patient chooses bed at      Patient to be transferred to   on  .  Patient to be transferred to facility by       Patient family notified on   of transfer.  Name of family member notified:        PHYSICIAN       Additional Comment:    _______________________________________________ Ihor Gully,  LCSW 10/04/2014, 4:08 PM [phone]

## 2014-10-04 NOTE — Clinical Social Work Note (Signed)
Clinical Social Work Assessment  Patient Details  Name: Marie Hunt MRN: [id number] Date of Birth: 05/22/1932  Date of referral:  10/04/14               Reason for consult:  Facility Placement                Permission sought to share information with:    Permission granted to share information::     Name::        Agency::     Relationship::     Contact Information:     Housing/Transportation Living arrangements for the past 2 months:  Single Family Home Source of Information:  Adult Children, Other (Comment Required) Patient Interpreter Needed:  None Criminal Activity/Legal Involvement Pertinent to Current Situation/Hospitalization:  No - Comment as needed Significant Relationships:  Adult Children, Other Family Members Lives with:  Self Do you feel safe going back to the place where you live?  Yes Need for family participation in patient care:  Yes (Comment)  Care giving concerns:  None.    Social Worker assessment / plan:  CSW met with patient, daughter Marie Hunt) and granddaughter Marie Hunt) were at bedside.  Patient drifted off to sleep and her family provided most of the history.  Ms. Marie Hunt advised that patient lives alone, ambulates unassisted, drives and lives a very independent lifestyle.  Ms. Marie Hunt indicated that patient still works part-time at a daycare.  CSW discussed the PT recommendationd for SNF.  Patient and family were agreeable to SNF.  CSW provided patient's family with the SNF list.  Family indicated that they only desired for patient to go to [facility] or [facility].    Employment status:  Part-Time Nurse, adult PT Recommendations:  [place] / Referral to community resources:  [place]  Patient/Family's Response to care:  Patient and family are agreeable to SNF.  Patient/Family's Understanding of and Emotional Response to Diagnosis, Current Treatment, and  Prognosis:  Patient and family understand patient's diagnosis, treatment and prognosis and are agreeable to go to SNF.   Emotional Assessment Appearance:  Developmentally appropriate Attitude/Demeanor/Rapport:   (Cooperative) Affect (typically observed):  Calm Orientation:  Oriented to Self, Oriented to Place, Oriented to  Time, Oriented to Situation Alcohol / Substance use:  Not Applicable Psych involvement (Current and /or in the community):  No (Comment)  Discharge Needs  Concerns to be addressed:  Discharge Planning Concerns Readmission within the last 30 days:  No Current discharge risk:  None Barriers to Discharge:  No Barriers Identified   Marie Gully, LCSW 10/04/2014, 12:09 PM

## 2014-10-04 NOTE — Clinical Social Work Placement (Signed)
   CLINICAL SOCIAL WORK PLACEMENT  NOTE  Date:  10/04/2014  Patient Details  Name: Marie Hunt MRN: [id number] Date of Birth: 1932-06-22  Clinical Social Work is seeking post-discharge placement for this patient at the [place] level of care (*CSW will initial, date and re-position this form in  chart as items are completed):  Yes   Patient/family provided with [place] Work Department's list of facilities offering this level of care within the geographic area requested by the patient (or if unable, by the patient's family).  Yes   Patient/family informed of their freedom to choose among providers that offer the needed level of care, that participate in Medicare, Medicaid or managed care program needed by the patient, have an available bed and are willing to accept the patient.  Yes   Patient/family informed of [facility]'s ownership interest in [facility] and [facility], as well as of the fact that they are under no obligation to receive care at these facilities.  PASRR submitted to EDS on 10/03/14     PASRR number received on 10/03/14     Existing PASRR number confirmed on       FL2 transmitted to all facilities in geographic area requested by pt/family on 10/04/14     FL2 transmitted to all facilities within larger geographic area on       Patient informed that his/her managed care company has contracts with or will negotiate with certain facilities, including the following:            Patient/family informed of bed offers received.  Patient chooses bed at       Physician recommends and patient chooses bed at      Patient to be transferred to   on  .  Patient to be transferred to facility by       Patient family notified on   of transfer.  Name of family member notified:        PHYSICIAN       Additional Comment:    _______________________________________________ Ihor Gully, LCSW 10/04/2014, 12:22  PM [phone]

## 2014-10-04 NOTE — Evaluation (Deleted)
Physical Therapy Evaluation Patient Details Name: Marie Hunt MRN: [id number] DOB: 02-03-1933 Today's Date: 10/04/2014   History of Present Illness  Pt is a 81yo black female who sustained a fall and Left hip fracture. Pt elected to undergo L THA and presents today on POD1.   Clinical Impression  Pt is received semirecumbent in bed upon entry, awake, alert, and willing to participate, surrounded by 6x family. No acute distress noted. Pt is A&Ox3 and pleasant. Pt reports zero falls in the last 6 months aside from current episode.  Pt strength as screened by MMT is 4/5 throughout BUE with moderately weak grip strength, and is generally weak in BLE. Strength deficits as most problematic during unsuccessful attempts to ambulate/transfer while maintaining TTWB. Pt remaining on 1L O2 throughout evaluation, but desaturating with activity (86%) as well as mobility RR down to 6BPM, whereas O2 recovers quickly during rest. PCA alarm is evident and RN notified. Patient presenting with impairment of strength, range of motion, balance, oxygen perfusion, and activity tolerance, limiting ability to perform ADL and mobility tasks at  baseline level of function. Patient will benefit from skilled intervention to address the above impairments and limitations, in order to restore to prior level of function, improve patient safety upon discharge, and to decrease falls risk.       Follow Up Recommendations SNF    Equipment Recommendations  None recommended by PT (To be determined by receiving facility at DC. )    Recommendations for Other Services       Precautions / Restrictions Precautions Precautions: Posterior Hip Precaution Booklet Issued: No Precaution Comments: Reviewed c pt and family Required Braces or Orthoses: Other Brace/Splint Other Brace/Splint: pillow wedge Restrictions Weight Bearing Restrictions: Yes LLE Weight Bearing: Touchdown weight bearing      Mobility  Bed Mobility Overal  bed mobility: +2 for physical assistance;Needs Assistance Bed Mobility: Supine to Sit     Supine to sit: Max assist        Transfers Overall transfer level: Needs assistance Equipment used: Rolling walker (2 wheeled) Transfers: Sit to/from [facility] Sit to Stand: Min assist Stand pivot transfers: Min guard       General transfer comment: Unable to maintain TTWB indep during transfer; unable to step c RLE and maintain TTWB during pivot   Ambulation/Gait Ambulation/Gait assistance:  (unable to perform due to weakness/pain. )              Stairs            Wheelchair Mobility    Modified Rankin (Stroke Patients Only)       Balance Overall balance assessment: Needs assistance Sitting-balance support: Single extremity supported;Feet supported Sitting balance-Leahy Scale: Fair     Standing balance support: Single extremity supported Standing balance-Leahy Scale: Fair                               Pertinent Vitals/Pain Pain Assessment: 0-10 Pain Score: 9  Pain Location: Left thigh  Pain Intervention(s): Limited activity within patient's tolerance;Monitored during session;Premedicated before session;PCA encouraged    Home Living Family/patient expects to be discharged to:: Skilled nursing facility Living Arrangements: Alone Available Help at Discharge: Family Type of Home: Apartment Home Access: Level entry     Home Layout: One level Home Equipment: None      Prior Function Level of Independence: Independent         Comments: Works in childcare  setting      Hand Dominance   Dominant Hand: Right    Extremity/Trunk Assessment   Upper Extremity Assessment: Generalized weakness (Grip strength is moderately weak and equal bilat; elbow flexion/extension is 4/5 bilatt and functionallly weak during RW use. )           Lower Extremity Assessment: Generalized weakness;LLE deficits/detail   LLE Deficits /  Details: Requires mod to max assist to perform all therex.   Cervical / Trunk Assessment: Kyphotic  Communication   Communication: No difficulties  Cognition Arousal/Alertness: Awake/alert Behavior During Therapy: WFL for tasks assessed/performed Overall Cognitive Status: Within Functional Limits for tasks assessed       Memory: Decreased recall of precautions              General Comments      Exercises Total Joint Exercises Ankle Circles/Pumps: AROM;Supine;15 reps;Both Gluteal Sets: AROM;Strengthening;Both;10 reps;Supine (Bridge initiation, isometric. ) Short Arc Quad: AAROM;Strengthening;Left;10 reps;Supine (Requires mod assist.) Heel Slides: AAROM;Left;10 reps;Supine;Strengthening (Requires max assist.) Hip ABduction/ADduction: AAROM;Strengthening;Left;10 reps;Supine (Requires max assist.) Straight Leg Raises: Strengthening;Left;AAROM;10 reps;Supine (Requires totalA )      Assessment/Plan    PT Assessment    PT Diagnosis Difficulty walking;Generalized weakness;Acute pain   PT Problem List    PT Treatment Interventions     PT Goals (Current goals can be found in the Care Plan section) Acute Rehab PT Goals Patient Stated Goal: Pt would like to regain strength to return to previously active PLOF.  PT Goal Formulation: With patient/family Time For Goal Achievement: 10/18/14 Potential to Achieve Goals: Good    Frequency     Barriers to discharge        Co-evaluation               End of Session Equipment Utilized During Treatment: Gait belt;Oxygen Activity Tolerance: Patient limited by fatigue;Patient limited by pain;Treatment limited secondary to medical complications (Comment) (RR, SaO2 dropping c activity. RN notified. ) Patient left: in chair;with call bell/phone within reach;with chair alarm set;with family/visitor present Nurse Communication: Mobility status;Other (comment);Weight bearing status         Time: 0933-1030 PT Time Calculation  (min) (ACUTE ONLY): 57 min   Charges:   PT Evaluation $Initial PT Evaluation Tier I: 1 Procedure PT Treatments $Therapeutic Exercise: 23-37 mins   PT G Codes:        Buccola,Allan C October 08, 2014, 11:44 AM  11:47 AM  Etta Grandchild, PT, DPT [place] License # [id number]

## 2014-10-04 NOTE — Care Management Note (Signed)
Case Management Note  Patient Details  Name: NEKODA LEHANE MRN: [id number] Date of Birth: [birth year]  Expected Discharge Date:   10/06/2014               Expected Discharge Plan:  [place]  In-House Referral:  Clinical Social Work  Discharge planning Services  CM Consult  Post Acute Care Choice:  NA Choice offered to:  NA  DME Arranged:    DME Agency:     HH Arranged:    [place] Agency:     Status of Service:  In process, will continue to follow  Medicare Important Message Given:    Date Medicare IM Given:    Medicare IM give by:    Date Additional Medicare IM Given:    Additional Medicare Important Message give by:     If discussed at [place] of Stay Meetings, dates discussed:    Additional Comments: Patient is from home alone and independent at baseline. Pt admitted after falling at home suffering hip fx. Pt has had surgery and PT has recommended SNF which patient and family are agreeable to. CSW has been referred and will arrange for placement. No CM needs anticipated. Will cont to follow.   Sherald Barge, RN 10/04/2014, 3:17 PM

## 2014-10-04 NOTE — Op Note (Signed)
The dictation system is down.  I tried to get on the dictation system yesterday and it was down right after surgery and remains down today.  OP NOTE  PRE-OP Diagnosis:  Subcapital fracture of the left hip  POST-OP Diagnosis:  Same  PROCEDURE:  Bipolar hip replacement on the left with a [employer] system, 48 mm head, 13 stem, press fit.  ANESTHESIA:  Spinal  Blood loss:  150 cc, none replaced  SURGEON:  Theodoro Kos, MD  ASSIST:  Loletha Grayer Page, RN  Drains:  One large hemovac  INDICATIONS:  She had surgery yesterday for cataract.  She went home.  She fell after she got tangled up with her feet.  She had no problem with her vision prior or post fall.  She was unable to stand.  She was seen in ER and x-rays showed subcapital fracture of the left hip.  She was admitted to hospitalist.  I have talked to patient and her family and have gone over the risks and imponderables of the hip procedure.  They appear to understand and agree to the procedure.  She will need skilled nursing post hospitalization.  PROCEDURE:  She was seen in the holding area and the left hip was marked as the correct surgical site.  She was taken to the operating room and given spinal anesthesia.  She was placed left side up decubitus position held in place with supports.  She was prepped and draped in the usual manner.  A generalized time out was held.  The patient was identified and the mark on the left hip was still visible.  We stated we were doing a left bipolar hip.  All the staff knew each other.  All instrumentation was in place and working.  A posterior approach to the left hip was made.  The sciatic nerve was identified and had a pennrose drain placed around it.  The short external hip rotators were tagged and then cut.  The hip capsule was entered.  A culture was taken of the hip.  The femoral head was removed with a cork screw.  The head measured 48 mm.  The femoral shaft was entered and reamed up to size 13  then rasped up to a size 13.  The neck of the femur was cut.  A trial reduction with a 13 stem, 48 head, 0 neck was done.  It fit well.  Leg lengths were normal.  The permanent prosthesis of a 13 stem was inserted press fit.  A 0 neck and 48 bipolar head was applied.  The hip was reduced.  It fit well.  X-rays were taken showing good positioning.  The short external rotators were attached with previous applied tags.  The hemovac drain was inserted.  The sciatic nerve was inspected with no apparent injury and the pennrose drain removed.  The fascial layer was closed with 0 Surgilon suture in a figure of 8 manner.  The subcutaneous tissue was closed with 2-0 plain suture.  The skin was closed with skin staples.  The hemovac drain was tied with 2-0 silk.  A sterile dressing was applied.  An abduction pillow was placed.  She tolerated the procedure well.  She will go to the recovery room.   Iona Hansen, MD  Typed on 10-04-2014 Surgery was on 10-03-2014

## 2014-10-04 NOTE — Evaluation (Signed)
Physical Therapy Evaluation Patient Details Name: Marie Hunt MRN: [id number] DOB: 1932-04-28 Today's Date: 10/04/2014   History of Present Illness  Pt is a 81yo black female who sustained a fall and Left hip fracture. Pt elected to undergo L THA and presents today on POD1.   Clinical Impression  Pt is received semirecumbent in bed upon entry, awake, alert, and willing to participate, surrounded by 6x family. No acute distress noted. Pt is A&Ox3 and pleasant. Pt reports zero falls in the last 6 months aside from current episode.  Pt strength as screened by MMT is 4/5 throughout BUE with moderately weak grip strength, and is generally weak in BLE. Strength deficits as most problematic during unsuccessful attempts to ambulate/transfer while maintaining TTWB. Pt remaining on 1L O2 throughout evaluation, but desaturating with activity (86%) as well as mobility RR down to 6BPM, whereas O2 recovers quickly during rest. PCA alarm is evident and RN notified. Patient presenting with impairment of strength, range of motion, balance, oxygen perfusion, and activity tolerance, limiting ability to perform ADL and mobility tasks at baseline level of function. Patient will benefit from skilled intervention to address the above impairments and limitations, in order to restore to prior level of function, improve patient safety upon discharge, and to decrease falls risk.    Follow Up Recommendations SNF    Equipment Recommendations  None recommended by PT    Recommendations for Other Services       Precautions / Restrictions Precautions Precautions: Posterior Hip Precaution Booklet Issued: No Precaution Comments: Reviewed c pt and family Required Braces or Orthoses: Other Brace/Splint Other Brace/Splint: pillow wedge Restrictions Weight Bearing Restrictions: Yes LLE Weight Bearing: Touchdown weight bearing      Mobility  Bed Mobility Overal bed mobility: +2 for physical assistance;Needs  Assistance Bed Mobility: Supine to Sit     Supine to sit: Max assist        Transfers Overall transfer level: Needs assistance Equipment used: Rolling walker (2 wheeled) Transfers: Sit to/from [facility] Sit to Stand: Min assist Stand pivot transfers: Min guard       General transfer comment: Unable to maintain TTWB indep during transfer; unable to step c RLE and maintain TTWB during pivot   Ambulation/Gait Ambulation/Gait assistance:  (unable to perform due to weakness/pain. )              Stairs            Wheelchair Mobility    Modified Rankin (Stroke Patients Only)       Balance Overall balance assessment: Needs assistance Sitting-balance support: Single extremity supported;Feet supported Sitting balance-Leahy Scale: Fair     Standing balance support: Single extremity supported Standing balance-Leahy Scale: Fair                               Pertinent Vitals/Pain Pain Assessment: 0-10 Pain Score: 9  Pain Location: Left thigh  Pain Intervention(s): Limited activity within patient's tolerance;Monitored during session;Premedicated before session;PCA encouraged    Home Living Family/patient expects to be discharged to:: Skilled nursing facility Living Arrangements: Alone Available Help at Discharge: Family Type of Home: Apartment Home Access: Level entry     Home Layout: One level Home Equipment: None      Prior Function Level of Independence: Independent         Comments: Works in childcare setting      Hand Dominance   Dominant Hand: Right  Extremity/Trunk Assessment   Upper Extremity Assessment: Generalized weakness (Grip strength is moderately weak and equal bilat; elbow flexion/extension is 4/5 bilatt and functionallly weak during RW use. )           Lower Extremity Assessment: Generalized weakness;LLE deficits/detail   LLE Deficits / Details: Requires mod to max assist to perform all  therex.   Cervical / Trunk Assessment: Kyphotic  Communication   Communication: No difficulties  Cognition Arousal/Alertness: Awake/alert Behavior During Therapy: WFL for tasks assessed/performed Overall Cognitive Status: Within Functional Limits for tasks assessed       Memory: Decreased recall of precautions              General Comments      Exercises Total Joint Exercises Ankle Circles/Pumps: AROM;Supine;15 reps;Both Gluteal Sets: AROM;Strengthening;Both;10 reps;Supine (Bridge initiation, isometric. ) Short Arc Quad: AAROM;Strengthening;Left;10 reps;Supine (Requires mod assist.) Heel Slides: AAROM;Left;10 reps;Supine;Strengthening (Requires max assist.) Hip ABduction/ADduction: AAROM;Strengthening;Left;10 reps;Supine (Requires max assist.) Straight Leg Raises: Strengthening;Left;AAROM;10 reps;Supine (Requires totalA )      Assessment/Plan    PT Assessment Patient needs continued PT services  PT Diagnosis Difficulty walking;Generalized weakness;Acute pain   PT Problem List Decreased strength;Decreased range of motion;Decreased knowledge of use of DME;Decreased safety awareness;Pain;Decreased activity tolerance;Decreased balance;Decreased mobility  PT Treatment Interventions DME instruction;Gait training;Functional mobility training;Stair training;Therapeutic activities;Therapeutic exercise;Balance training   PT Goals (Current goals can be found in the Care Plan section) Acute Rehab PT Goals Patient Stated Goal: Pt would like to regain strength to return to previously active PLOF.  PT Goal Formulation: With patient/family Time For Goal Achievement: 10/18/14 Potential to Achieve Goals: Good    Frequency BID   Barriers to discharge        Co-evaluation               End of Session Equipment Utilized During Treatment: Gait belt;Oxygen Activity Tolerance: Patient limited by fatigue;Patient limited by pain;Treatment limited secondary to medical complications  (Comment) Patient left: in chair;with call bell/phone within reach;with chair alarm set;with family/visitor present Nurse Communication: Mobility status;Other (comment);Weight bearing status         Time: 0933-1030 PT Time Calculation (min) (ACUTE ONLY): 57 min   Charges:   PT Evaluation $Initial PT Evaluation Tier I: 1 Procedure PT Treatments $Therapeutic Exercise: 23-37 mins   PT G Codes:        Devan Danzer C 10-15-14, 12:00 PM  12:01 PM  Etta Grandchild, PT, DPT [place] License # [id number]

## 2014-10-04 NOTE — Progress Notes (Signed)
Physical Therapy Treatment Patient Details Name: Marie Hunt MRN: [id number] DOB: [birth year] Today's Date: 10/04/2014    History of Present Illness Pt is a 81yo black female who sustained a fall and Left hip fracture. Pt elected to undergo L THA and presents today on POD1.     PT Comments     Pt tolerating treatment session well, motivated and able to complete entire PT sesssion as planned. Ambulation deferred this session, as pt lacks sufficient BUE strength to maintain TTWB status c RW. Pt continues to make progress toward goals as evidenced by improved understanding of precautions c follow-up education. Pt's greatest limitation continues to be weakness which continues to limit ability to perform HEP indep. SaO2 is better this afternoon, with only once significant drop which occurred c valsalva during transfer. Patient presenting with impairment of strength, pain, range of motion, balance, and activity tolerance, limiting ability to perform ADL and mobility tasks at  baseline level of function. Patient will benefit from skilled intervention to address the above impairments and limitations, in order to restore to prior level of function, improve patient safety upon discharge, and to decrease caregiver burden.     Follow Up Recommendations  SNF     Equipment Recommendations  None recommended by PT    Recommendations for Other Services       Precautions / Restrictions Precautions Precautions: Posterior Hip Precaution Booklet Issued: Yes (comment) Precaution Comments: Reviewed c pt and family Required Braces or Orthoses: Other Brace/Splint Other Brace/Splint: pillow wedge Restrictions Weight Bearing Restrictions: Yes LLE Weight Bearing: Touchdown weight bearing    Mobility  Bed Mobility Overal bed mobility: +2 for physical assistance;Needs Assistance Bed Mobility: Supine to Sit     Supine to sit: Max assist     General bed mobility comments: Pt received and DC seated  in chair per pt request.   Transfers Overall transfer level: Needs assistance Equipment used: Rolling walker (2 wheeled) Transfers: Sit to/from Stand Sit to Stand: Mod assist;Min assist Stand pivot transfers: Min guard       General transfer comment: Requires minA for first 2, and ModA for last transfer; limited contribution from BUE due to weakness, TTWB is being perfomed better but not 100%.   Ambulation/Gait Ambulation/Gait assistance:  (unable to perform due to weakness/pain. )               Stairs            Wheelchair Mobility    Modified Rankin (Stroke Patients Only)       Balance Overall balance assessment: No apparent balance deficits (not formally assessed);Modified Independent Sitting-balance support: Single extremity supported;Feet supported Sitting balance-Leahy Scale: Fair     Standing balance support: Single extremity supported Standing balance-Leahy Scale: Fair Standing balance comment: demonstrates difficulty maintaining pelvis forwrd and TKE on R side.                     Cognition Arousal/Alertness: Awake/alert Behavior During Therapy: WFL for tasks assessed/performed Overall Cognitive Status: Within Functional Limits for tasks assessed       Memory: Decreased recall of precautions              Exercises Total Joint Exercises Ankle Circles/Pumps: AROM;15 reps;Both;Seated Gluteal Sets: AROM;Strengthening;Both;10 reps;Supine (Bridge initiation, isometric. ) Short Arc Quad: AAROM;Strengthening;Left;10 reps;Supine (Requires mod assist.) Heel Slides: AAROM;Left;10 reps;Strengthening;Seated (very weak; requires mod-maxA ) Hip ABduction/ADduction: AAROM;Strengthening;Left;10 reps;Seated (very weak; requires mod-maxA ) Straight Leg Raises: Strengthening;Left;AAROM;10 reps;Supine (Requires totalA ) Long  Arc Quad: Strengthening;AAROM;Left;10 reps;Seated (very weak; requires mod-maxA ) Other Exercises Other Exercises: Manually  resisted chest press 1x12 bilat; very weak, requires cues for attention and quality.     General Comments        Pertinent Vitals/Pain Pain Assessment: 0-10 Pain Score: 0-No pain Pain Location: Left thigh; pain c movement only.  Pain Intervention(s): Limited activity within patient's tolerance;PCA encouraged;Monitored during session    [place] expects to be discharged to:: Skilled nursing facility Living Arrangements: Alone Available Help at Discharge: Family Type of Home: Apartment Home Access: Level entry   Home Layout: One level Home Equipment: None      Prior Function Level of Independence: Independent      Comments: Works in childcare setting    PT Goals (current goals can now be found in the care plan section) Acute Rehab PT Goals Patient Stated Goal: Pt would like to regain strength to return to previously active PLOF.  PT Goal Formulation: With patient/family Time For Goal Achievement: 10/18/14 Potential to Achieve Goals: Good Progress towards PT goals: PT to reassess next treatment    Frequency  BID    PT Plan Current plan remains appropriate    Co-evaluation             End of Session Equipment Utilized During Treatment: Oxygen Activity Tolerance: Patient limited by fatigue;Patient tolerated treatment well Patient left: in chair;with call bell/phone within reach;with chair alarm set;with family/visitor present     Time: 1310-1340 PT Time Calculation (min) (ACUTE ONLY): 30 min  Charges:  $Therapeutic Exercise: 23-37 mins                    G Codes:      Joclyn Alsobrook C 11/03/2014, 3:02 PM  3:04 PM  Etta Grandchild, PT, DPT [place] License # [id number]

## 2014-10-04 NOTE — Progress Notes (Signed)
[facility] PROGRESS NOTE  Marie Hunt [id number] DOB: [birth year] DOA: 10/02/2014 PCP: Monico Blitz, MD  Assessment/Plan: 1. Left hip fracture, secondary to mechanical fall. S/p operative repair by Dr. Luna Glasgow, Orthopedic Surgeon on 8/9. Pt is currently hemodynamically stable post surgery. Started on Morphine PCA per Ortho, will try and wean off as tolerated.  Will need continued PT. Recommending SNF on d/c.  2.  HTN. BP has been running high, likely exacerbated by pain. Continue to monitor. 3. CAD. Seen by cardiology who reports she has overall low risk for this procedure from cardiac stand point. ECHO completed, unremarkable. No reports of chest pain at this time.  4. DM type 2. Continue SSI. 5. Chronic anemia. HGB is stable s/p 1 unit PRBC. Appears near baseline will follow closely. 6. Possible CKD Stage IV. Creatinine trending down. Records have been requested by PCP, but not received. She reportedly seen Nephrology in the past. Will continue to follow renal function.   Code Status: Full DVT prophylaxis: Lovenox and SCDs Family Communication: Family member bedside. Spoke with patient and she understands care plan with no concerns at this time. Disposition Plan: will d/c with SNF placement    Consultants:  Orthopedic surgery- Dr. Luna Glasgow  Cardiology- Herminio Commons, MD  Procedures:  Transfusion- 1 unit PRBC  Antibiotics:    HPI/Subjective: Pt reports being in pain, as she chooses not to use the PCA.  No SOB, n/v, or chest pain.   Objective: Filed Vitals:   10/04/14 0449  BP:   Pulse:   Temp:   Resp: 16    Intake/Output Summary (Last 24 hours) at 10/04/14 0708 Last data filed at 10/04/14 0600  Gross per 24 hour  Intake   2504 ml  Output   2280 ml  Net    224 ml   Filed Weights   10/02/14 1749 10/02/14 2314 10/03/14 1230  Weight: 66.225 kg (146 lb) 65 kg (143 lb 4.8 oz) 64.864 kg (143 lb)    Exam:  General:  Appears calm and comfortable,  sitting in chair. Cardiovascular: RRR, no rubs or gallops. 3/6 SEM. Respiratory: CTA bilaterally, no w/r/r. Normal respiratory effort. Abdomen: soft, non tender, nd Skin: no rash or induration seen on limited exam Musculoskeletal: LLE mild edema. DP and PT pulses 2+. Psychiatric: grossly normal mood and affect, speech fluent and appropriate Neurologic: grossly non-focal  Data Reviewed: Basic Metabolic Panel:  Recent Labs Lab 10/02/14 2000 10/03/14 0613  NA 143 141  K 4.0 4.0  CL 102 103  CO2 25 30  GLUCOSE 117* 131*  BUN 31* 29*  CREATININE 2.26* 2.21*  CALCIUM 9.9 9.1   Liver Function Tests: No results for input(s): AST, ALT, ALKPHOS, BILITOT, PROT, ALBUMIN in the last 168 hours. No results for input(s): LIPASE, AMYLASE in the last 168 hours. No results for input(s): AMMONIA in the last 168 hours. CBC:  Recent Labs Lab 10/02/14 2000 10/03/14 0613 10/03/14 1505  WBC 12.8* 9.3  --   NEUTROABS 9.2*  --   --   HGB 10.5* 9.4* 10.4*  HCT 32.6* 29.2* 31.8*  MCV 93.1 93.0  --   PLT 176 158  --     CBG:  Recent Labs Lab 10/03/14 1215 10/03/14 1508 10/03/14 2010 10/04/14 0021 10/04/14 0437  GLUCAP 125* 126* 204* 220* 165*    Recent Results (from the past 240 hour(s))  Surgical pcr screen     Status: None   Collection Time: 10/03/14  9:15 AM  Result Value Ref  Range Status   MRSA, PCR NEGATIVE NEGATIVE Final   Staphylococcus aureus NEGATIVE NEGATIVE Final    Comment:        The Xpert SA Assay (FDA approved for NASAL specimens in patients over 106 years of age), is one component of a comprehensive surveillance program.  Test performance has been validated by [facility] for patients greater than or equal to 63 year old. It is not intended to diagnose infection nor to guide or monitor treatment.      Studies: Dg Chest 1 View  10/02/2014   CLINICAL DATA:  Status post slip and fall today.  EXAM: CHEST  1 VIEW  COMPARISON:  PA and lateral chest 08/24/2009.   FINDINGS: The lungs are clear. Heart size is normal. No pneumothorax or pleural effusion. No focal bony abnormality.  IMPRESSION: No acute disease.   Electronically Signed   By: Inge Rise M.D.   On: 10/02/2014 19:54   Dg Knee Complete 4 Views Left  10/02/2014   CLINICAL DATA:  Pain following fall  EXAM: LEFT KNEE - COMPLETE 4+ VIEW  COMPARISON:  None.  FINDINGS: Frontal, bilateral oblique, and lateral images were obtained. There is no demonstrable fracture or dislocation. No appreciable joint effusion. There is moderate generalized joint space narrowing. No erosive change. There are foci of atherosclerotic calcification.  IMPRESSION: Generalized osteoarthritic change. No fracture or joint effusion. Areas of atherosclerotic calcification.   Electronically Signed   By: Lowella Grip III M.D.   On: 10/02/2014 19:54   Dg Hip Port Unilat With Pelvis 1v Left  10/03/2014   CLINICAL DATA:  Total hip replacement.  EXAM: DG HIP (WITH OR WITHOUT PELVIS) 1V PORT LEFT  COMPARISON:  10/02/2014.  FINDINGS: LEFT hip hemiarthroplasty is present. Surgical defect in the lateral soft tissues. Atherosclerosis. Hip appears located. Single AP view of the LEFT hip is submitted for interpretation.  IMPRESSION: LEFT hip hemiarthroplasty at intraoperative radiographs demonstrating LEFT hip hemiarthroplasty.   Electronically Signed   By: Dereck Ligas M.D.   On: 10/03/2014 14:29   Dg Hip Unilat With Pelvis 2-3 Views Left  10/02/2014   CLINICAL DATA:  Status post fall today. Left hip pain. Initial encounter.  EXAM: DG HIP (WITH OR WITHOUT PELVIS) 2-3V LEFT  COMPARISON:  None.  FINDINGS: The patient has an acute subcapital left hip fracture. No other acute bony or joint abnormality is identified. Bones are osteopenic. Lower lumbar spondylosis is noted.  IMPRESSION: Subcapital fracture left hip.   Electronically Signed   By: Inge Rise M.D.   On: 10/02/2014 19:18    Scheduled Meds: . amLODipine  5 mg Oral Daily  .  enoxaparin (LOVENOX) injection  30 mg Subcutaneous Q24H  . insulin aspart  0-9 Units Subcutaneous 6 times per day  . ketorolac  1 drop Right Eye BID  . metoprolol succinate  25 mg Oral Daily  . morphine   Intravenous 6 times per day  . ofloxacin  1 drop Right Eye QID  . prednisoLONE acetate  1 drop Right Eye TID   Continuous Infusions: . lactated ringers 75 mL/hr at 10/04/14 0620    Principal Problem:   Hip fracture Active Problems:   Hypertension   Carotid artery disease   Type 2 diabetes mellitus   Hip fx    Time spent: 25 minutes.     [name], MD [facility] Pager [phone]. If 7PM-7AM, please contact night-coverage at [email or web address], password [facility] 10/04/2014, 7:08 AM  LOS: 2 days  I, Norg'e Tisdol, acting as scribe, recorded this note contemporaneously in the presence of Dr. [name], M.D. on 10/04/2014 at 7:08 AM.   Attending:  I have reviewed the above documentation for accuracy and completeness, and I agree with the above.  [name], MD

## 2014-10-04 NOTE — Anesthesia Postprocedure Evaluation (Signed)
  Anesthesia Post-op Note  Patient: Marie Hunt  Procedure(s) Performed: Procedure(s): ARTHROPLASTY BIPOLAR HIP (HEMIARTHROPLASTY) (Left)  Patient Location: Room 314  Anesthesia Type:Spinal  Level of Consciousness: awake, alert , oriented and patient cooperative  Airway and Oxygen Therapy: Patient Spontanous Breathing and Patient connected to nasal cannula oxygen  Post-op Pain: mild  Post-op Assessment: Post-op Vital signs reviewed, Patient's Cardiovascular Status Stable, Respiratory Function Stable, Patent Airway, No signs of Nausea or vomiting and Pain level controlled              Post-op Vital Signs: Reviewed and stable  Last Vitals:  Filed Vitals:   10/04/14 1200  BP:   Pulse:   Temp:   Resp: 17    Complications: No apparent anesthesia complications

## 2014-10-04 NOTE — Plan of Care (Signed)
Problem: Acute Rehab PT Goals(only PT should resolve) Goal: Pt Will Go Supine/Side To Sit Pt will demonstrate ModI bed mobility supine to sitting edge-of-bed to return to PLOF and to decrease caregiver burden.     Goal: Patient Will Transfer Sit To/From Stand Pt will transfer sit to/from-stand with RW at ModI without loss-of-balance to demonstrate good safety awareness for independent mobility in home.     Goal: Pt Will Ambulate Pt will ambulate with RW at Supervision using a step-through pattern and equal step length for a distances greater than 29ft to demonstrate the ability to perform safe household distance ambulation at discharge.

## 2014-10-05 ENCOUNTER — Inpatient Hospital Stay (HOSPITAL_COMMUNITY): Payer: Medicare Other

## 2014-10-05 DIAGNOSIS — Z01810 Encounter for preprocedural cardiovascular examination: Secondary | ICD-10-CM

## 2014-10-05 LAB — TYPE AND SCREEN
ABO/RH(D): O POS
ANTIBODY SCREEN: NEGATIVE
UNIT DIVISION: 0
Unit division: 0
Unit division: 0
Unit division: 0

## 2014-10-05 LAB — CBC WITH DIFFERENTIAL/PLATELET
BASOS PCT: 0 % (ref 0–1)
Basophils Absolute: 0 10*3/uL (ref 0.0–0.1)
Eosinophils Absolute: 0 10*3/uL (ref 0.0–0.7)
Eosinophils Relative: 0 % (ref 0–5)
HEMATOCRIT: 27 % — AB (ref 36.0–46.0)
Hemoglobin: 9 g/dL — ABNORMAL LOW (ref 12.0–15.0)
Lymphocytes Relative: 16 % (ref 12–46)
Lymphs Abs: 2.1 10*3/uL (ref 0.7–4.0)
MCH: 30.5 pg (ref 26.0–34.0)
MCHC: 33.3 g/dL (ref 30.0–36.0)
MCV: 91.5 fL (ref 78.0–100.0)
Monocytes Absolute: 1 10*3/uL (ref 0.1–1.0)
Monocytes Relative: 8 % (ref 3–12)
NEUTROS ABS: 9.7 10*3/uL — AB (ref 1.7–7.7)
Neutrophils Relative %: 76 % (ref 43–77)
PLATELETS: 128 10*3/uL — AB (ref 150–400)
RBC: 2.95 MIL/uL — ABNORMAL LOW (ref 3.87–5.11)
RDW: 13.3 % (ref 11.5–15.5)
WBC: 12.8 10*3/uL — AB (ref 4.0–10.5)

## 2014-10-05 LAB — GLUCOSE, CAPILLARY
GLUCOSE-CAPILLARY: 141 mg/dL — AB (ref 65–99)
GLUCOSE-CAPILLARY: 163 mg/dL — AB (ref 65–99)
GLUCOSE-CAPILLARY: 198 mg/dL — AB (ref 65–99)
GLUCOSE-CAPILLARY: 195 mg/dL — AB (ref 65–99)
Glucose-Capillary: 133 mg/dL — ABNORMAL HIGH (ref 65–99)
Glucose-Capillary: 143 mg/dL — ABNORMAL HIGH (ref 65–99)

## 2014-10-05 LAB — BASIC METABOLIC PANEL
Anion gap: 7 (ref 5–15)
BUN: 24 mg/dL — ABNORMAL HIGH (ref 6–20)
CO2: 29 mmol/L (ref 22–32)
Calcium: 8.6 mg/dL — ABNORMAL LOW (ref 8.9–10.3)
Chloride: 102 mmol/L (ref 101–111)
Creatinine, Ser: 1.67 mg/dL — ABNORMAL HIGH (ref 0.44–1.00)
GFR calc Af Amer: 33 mL/min — ABNORMAL LOW (ref >60)
GFR, EST NON AFRICAN AMERICAN: 28 mL/min — AB (ref >60)
GLUCOSE: 142 mg/dL — AB (ref 65–99)
POTASSIUM: 3.8 mmol/L (ref 3.5–5.1)
Sodium: 138 mmol/L (ref 135–145)

## 2014-10-05 NOTE — Care Management Important Message (Signed)
Important Message  Patient Details  Name: Marie Hunt MRN: [id number] Date of Birth: 1932/10/02   Medicare Important Message Given:  Yes-second notification given    Sherald Barge, RN 10/05/2014, 12:31 PM

## 2014-10-05 NOTE — Progress Notes (Signed)
Subjective: 2 Days Post-Op Procedure(s) (LRB): ARTHROPLASTY BIPOLAR HIP (HEMIARTHROPLASTY) (Left) Patient reports pain as mild.    Objective: Vital signs in last 24 hours: Temp:  [98.3 F (36.8 C)-99.7 F (37.6 C)] 99.7 F (37.6 C) (08/11 0533) Pulse Rate:  [69-111] 69 (08/11 0533) Resp:  [11-21] 16 (08/11 0533) BP: (116-185)/(45-75) 176/45 mmHg (08/11 0533) SpO2:  [86 %-100 %] 100 % (08/11 0533) FiO2 (%):  [96 %-99 %] 96 % (08/10 1600)  Intake/Output from previous day: 08/10 0701 - 08/11 0700 In: 1345 [P.O.:360; I.V.:985] Out: 950 [Urine:950] Intake/Output this shift:     Recent Labs  10/02/14 2000 10/03/14 0613 10/03/14 1505 10/04/14 0629 10/05/14 0644  HGB 10.5* 9.4* 10.4* 9.8* 9.0*    Recent Labs  10/04/14 0629 10/05/14 0644  WBC 12.4* 12.8*  RBC 3.29* 2.95*  HCT 30.3* 27.0*  PLT 153 128*    Recent Labs  10/03/14 0613 10/04/14 0629  NA 141 139  K 4.0 3.7  CL 103 100*  CO2 30 26  BUN 29* 24*  CREATININE 2.21* 1.88*  GLUCOSE 131* 211*  CALCIUM 9.1 8.9    Recent Labs  10/02/14 2000  INR 1.06    Neurologically intact Neurovascular intact Sensation intact distally Intact pulses distally Dorsiflexion/Plantar flexion intact Incision: scant drainage No cellulitis present  Hemovac drain removed.  Wound OK.  She did fair in PT yesterday.  Assessment/Plan: 2 Days Post-Op Procedure(s) (LRB): ARTHROPLASTY BIPOLAR HIP (HEMIARTHROPLASTY) (Left) Up with therapy  Plan SNF perhaps discharge tomorrow.  She will need to continue on enoxaparin for one month at nursing home.  She will need to use walker and have weight bearing as tolerated to the left leg.  She will need to have staples removed from the left hip on August 19th and Steri-strip the wound.  I will need to see her in my office one month after discharge.  If she is still in the SNF, they will need to get x-rays of the left hip prior to my appointment.  Vinton Layson 10/05/2014, 7:08  AM

## 2014-10-05 NOTE — Progress Notes (Signed)
  Echocardiogram 2D Echocardiogram has been performed.  Lysle Rubens 10/05/2014, 1:17 PM

## 2014-10-05 NOTE — Progress Notes (Signed)
Physical Therapy Treatment Patient Details Name: Marie Hunt MRN: [id number] DOB: September 21, 1932 Today's Date: 10/05/2014    History of Present Illness Pt is a 81yo black female who sustained a fall and Left hip fracture. Pt elected to undergo L THA and presents today on POD2     PT Comments    Pt very cooperative.  Pt ambulation limited by fatigue today.  Pt should progress very well   Follow Up Recommendations  SNF     Equipment Recommendations  None recommended by PT       Precautions / Restrictions Precautions Precautions: Posterior Hip;Fall Restrictions Weight Bearing Restrictions: Yes LLE Weight Bearing: Weight bearing as tolerated    Mobility  Bed Mobility Overal bed mobility: Needs Assistance Bed Mobility: Supine to Sit     Supine to sit: Mod assist        Transfers Overall transfer level: Needs assistance Equipment used: Rolling walker (2 wheeled) Transfers: Sit to/from [facility] Sit to Stand: Mod assist            Ambulation/Gait Ambulation/Gait assistance: Min assist Ambulation Distance (Feet): 4 Feet Assistive device: Rolling walker (2 wheeled) Gait Pattern/deviations: Step-to pattern;Trunk flexed;Decreased step length - left;Decreased step length - right   Gait velocity interpretation: Below normal speed for age/gender     Stairs            Wheelchair Mobility    Modified Rankin (Stroke Patients Only)       Balance                                    Cognition Arousal/Alertness: Awake/alert Behavior During Therapy: WFL for tasks assessed/performed Overall Cognitive Status: Within Functional Limits for tasks assessed                      Exercises Total Joint Exercises Ankle Circles/Pumps: Both;10 reps Quad Sets: Both;10 reps Gluteal Sets: Both;10 reps Heel Slides: Both;10 reps        Pertinent Vitals/Pain Pain Score: 8  Pain Location: Lt leg  Pain Descriptors /  Indicators: Aching Pain Intervention(s): PCA encouraged;Limited activity within patient's tolerance           PT Goals (current goals can now be found in the care plan section) Acute Rehab PT Goals Patient Stated Goal: Pt would like to regain strength to return to previously active PLOF.  PT Goal Formulation: With patient/family Time For Goal Achievement: 10/18/14 Potential to Achieve Goals: Good Progress towards PT goals: Progressing toward goals    Frequency  7X/week    PT Plan Frequency needs to be updated    Co-evaluation             End of Session Equipment Utilized During Treatment: Oxygen;Gait belt Activity Tolerance: Patient tolerated treatment well Patient left: in chair;with call bell/phone within reach;with chair alarm set     Time: 0935-1005 PT Time Calculation (min) (ACUTE ONLY): 30 min  Charges:  $Gait Training: 8-22 mins $Therapeutic Exercise: 8-22 mins                    G CodesRayetta Humphrey, PT CLT [phone] 10/05/2014, 10:06 AM

## 2014-10-05 NOTE — Progress Notes (Signed)
[facility] PROGRESS NOTE  Marie Hunt [id number] DOB: 1933-02-23 DOA: 10/02/2014 PCP: Monico Blitz, MD  Assessment/Plan: 1. Left hip fracture, secondary to mechanical fall. S/p operative repair by Dr. Luna Glasgow, Orthopedic Surgeon on 8/9. Pt is currently hemodynamically stable post surgery. Started on Morphine PCA per Ortho.  Will transition to oral pain meds today. Will need continued PT. Recommending SNF on d/c.  2.  HTN. BP has been running high. Will increase Norvasc to 10mg /daily.  Continue Metoprolol and continue to monitor. 3. CAD. Seen by cardiology who reports she has overall low risk for this procedure from cardiac stand point. ECHO completed, unremarkable. No reports of chest pain at this time.  4. DM type 2. Continue SSI.  Glucose trending downward to stable range. 5. Chronic anemia. S/p 1 unit PRBC. Hgb has trended down since surgery.  Current Hgb is 9.  No obvious signs of bleeding.  Continue to monitor. 6. Possible CKD Stage IV. Creatinine remains high, but continues trending down. Records have been requested from PCP, but not received. She reportedly saw Nephrology in the past. Will continue to follow renal function.   Code Status: Full DVT prophylaxis: Lovenox and SCDs Family Communication: Spoke with patient and she understands care plan with no concerns at this time. Disposition Plan: will d/c with SNF placement, possibly 8/12.    Consultants:  Orthopedic surgery- Dr. Luna Glasgow  Cardiology- Herminio Commons, MD  Procedures:  Transfusion- 1 unit PRBC  Antibiotics:    HPI/Subjective:  Pt reports pain much better this morning; has been using pain med pump.  Has been up walking the halls with PT and reports pain on walking.  No SOB reported.    Objective: Filed Vitals:   10/05/14 0533  BP: 176/45  Pulse: 69  Temp: 99.7 F (37.6 C)  Resp: 16    Intake/Output Summary (Last 24 hours) at 10/05/14 0657 Last data filed at 10/05/14 0533  Gross  per 24 hour  Intake   1345 ml  Output    950 ml  Net    395 ml   Filed Weights   10/02/14 1749 10/02/14 2314 10/03/14 1230  Weight: 66.225 kg (146 lb) 65 kg (143 lb 4.8 oz) 64.864 kg (143 lb)    Exam:  General:  Appears calm and comfortable. Cardiovascular: Regular rate/rhythm, no rubs or gallops. 3/6 SEM. Respiratory: Clear to auscultation bilaterally, no w/r/r.  Musculoskeletal:  No BLE edema. DP and PT pulses 2+. Psychiatric: Normal mood and affect, normal speech Neurologic: grossly non-focal, A&Ox3  Data Reviewed: Basic Metabolic Panel:  Recent Labs Lab 10/02/14 2000 10/03/14 0613 10/04/14 0629  NA 143 141 139  K 4.0 4.0 3.7  CL 102 103 100*  CO2 25 30 26   GLUCOSE 117* 131* 211*  BUN 31* 29* 24*  CREATININE 2.26* 2.21* 1.88*  CALCIUM 9.9 9.1 8.9   Liver Function Tests: No results for input(s): AST, ALT, ALKPHOS, BILITOT, PROT, ALBUMIN in the last 168 hours. No results for input(s): LIPASE, AMYLASE in the last 168 hours. No results for input(s): AMMONIA in the last 168 hours. CBC:  Recent Labs Lab 10/02/14 2000 10/03/14 0613 10/03/14 1505 10/04/14 0629  WBC 12.8* 9.3  --  12.4*  NEUTROABS 9.2*  --   --  10.1*  HGB 10.5* 9.4* 10.4* 9.8*  HCT 32.6* 29.2* 31.8* 30.3*  MCV 93.1 93.0  --  92.1  PLT 176 158  --  153    CBG:  Recent Labs Lab 10/04/14 1111 10/04/14 1632  10/04/14 1959 10/05/14 0003 10/05/14 0407  GLUCAP 167* 174* 206* 143* 141*    Recent Results (from the past 240 hour(s))  Urine culture     Status: None   Collection Time: 10/02/14  8:29 PM  Result Value Ref Range Status   Specimen Description URINE, CATHETERIZED  Final   Special Requests NONE  Final   Culture   Final    NO GROWTH 1 DAY Performed at [facility]    Report Status 10/04/2014 FINAL  Final  Surgical pcr screen     Status: None   Collection Time: 10/03/14  9:15 AM  Result Value Ref Range Status   MRSA, PCR NEGATIVE NEGATIVE Final   Staphylococcus aureus  NEGATIVE NEGATIVE Final    Comment:        The Xpert SA Assay (FDA approved for NASAL specimens in patients over 63 years of age), is one component of a comprehensive surveillance program.  Test performance has been validated by [facility] for patients greater than or equal to 22 year old. It is not intended to diagnose infection nor to guide or monitor treatment.   Wound culture     Status: None (Preliminary result)   Collection Time: 10/03/14  1:50 PM  Result Value Ref Range Status   Specimen Description WOUND LEFT HIP CAPSULE  Final   Special Requests ANCEF 2gm  Final   Gram Stain   Final    RARE WBC PRESENT, PREDOMINANTLY PMN NO SQUAMOUS EPITHELIAL CELLS SEEN NO ORGANISMS SEEN Performed at [facility]    Culture NO GROWTH Performed at [facility]   Final   Report Status PENDING  Incomplete     Studies: Dg Hip Port Unilat With Pelvis 1v Left  10/03/2014   CLINICAL DATA:  Total hip replacement.  EXAM: DG HIP (WITH OR WITHOUT PELVIS) 1V PORT LEFT  COMPARISON:  10/02/2014.  FINDINGS: LEFT hip hemiarthroplasty is present. Surgical defect in the lateral soft tissues. Atherosclerosis. Hip appears located. Single AP view of the LEFT hip is submitted for interpretation.  IMPRESSION: LEFT hip hemiarthroplasty at intraoperative radiographs demonstrating LEFT hip hemiarthroplasty.   Electronically Signed   By: Dereck Ligas M.D.   On: 10/03/2014 14:29    Scheduled Meds: . amLODipine  5 mg Oral Daily  . enoxaparin (LOVENOX) injection  30 mg Subcutaneous Q24H  . insulin aspart  0-9 Units Subcutaneous 6 times per day  . ketorolac  1 drop Right Eye BID  . metoprolol succinate  25 mg Oral Daily  . morphine   Intravenous 6 times per day  . prednisoLONE acetate  1 drop Right Eye TID   Continuous Infusions: . lactated ringers 75 mL/hr (10/04/14 2016)    Principal Problem:   Hip fracture Active Problems:   Hypertension   Carotid artery disease   Type 2  diabetes mellitus   Hip fx    Time spent: 25 minutes.     [name], MD [facility] Pager [phone]. If 7PM-7AM, please contact night-coverage at [email or web address], password [facility] 10/05/2014, 6:57 AM  LOS: 3 days     I, [name], acting as scribe, recorded this note contemporaneously in the presence of Dr. [name], M.D. on 10/05/2014 at 6:57 AM.   Attending:  I have reviewed the above documentation for accuracy and completeness, and I agree with the above.  [name], MD

## 2014-10-05 NOTE — Progress Notes (Signed)
Physical Therapy Treatment Patient Details Name: Marie Hunt MRN: [id number] DOB: 01-Jun-1932 Today's Date: 10/05/2014    History of Present Illness Pt is a 81yo black female who sustained a fall and Left hip fracture. Pt elected to undergo L THA and presents today on POD2     PT Comments    Pt tolerating treatment session well, motivated and able to complete entire PT sesssion as planned. Pt continues to make progress toward goals as evidenced by improved strength during therex, however pt continues to require mod-max assist for performance of all. Pt's greatest limitation continues to be UE weakness which continues to limit ability to perform safe mobility c TTWB. Patient presenting with impairment of strength, pain, range of motion, and balance, limiting ability to perform ADL and mobility tasks at  baseline level of function. Patient will benefit from skilled intervention to address the above impairments and limitations, in order to restore to prior level of function, improve patient safety upon discharge, and to decrease caregiver burden.    Follow Up Recommendations  SNF     Equipment Recommendations  None recommended by PT    Recommendations for Other Services       Precautions / Restrictions Precautions Precautions: Posterior Hip Precaution Booklet Issued: Yes (comment) Precaution Comments: Reviewed c pt and family; pt unable to recite any precautions without verbal cues adn hints.  Required Braces or Orthoses: Other Brace/Splint Other Brace/Splint: pillow wedge Restrictions Weight Bearing Restrictions: Yes LLE Weight Bearing: Weight bearing as tolerated (Pt unable to describe WB status upon cue. )    Mobility  Bed Mobility Overal bed mobility: Needs Assistance Bed Mobility: Supine to Sit;Sit to Supine     Supine to sit: Mod assist     General bed mobility comments: Pt able to assist with scoot in bed via trapeze and rails.   Transfers Overall transfer  level: Needs assistance Equipment used: Standard walker Transfers: Sit to/from Stand Sit to Stand: From elevated surface;Min assist         General transfer comment: 5x from elevated surface, better able to maintain TTWB.   Ambulation/Gait             General Gait Details: Not attempted due to difficulty maintaining TTWB.    Stairs            Wheelchair Mobility    Modified Rankin (Stroke Patients Only)       Balance Overall balance assessment: No apparent balance deficits (not formally assessed);Modified Independent         Standing balance support: Bilateral upper extremity supported Standing balance-Leahy Scale: Fair                      Cognition Arousal/Alertness: Awake/alert Behavior During Therapy: WFL for tasks assessed/performed Overall Cognitive Status: Within Functional Limits for tasks assessed       Memory: Decreased recall of precautions (unable to describe precautions or WB status. )              Exercises Total Joint Exercises Quad Sets: 10 reps;Left;Supine;Strengthening Short Arc Quad: Strengthening;Left;10 reps;Supine (Req mod assist, slightly more active today ) Heel Slides: 10 reps;Left;Strengthening;Supine (Req mod assist, slightly more active today ) Hip ABduction/ADduction: Strengthening;Left;10 reps;Supine (Req max assist, slightly more active today ) Bridges: Strengthening;Both;10 reps (performed for bed mobility in/out of bed and scoot.ing. ) Other Exercises Other Exercises: Manually resisted chest press 1x10 bilat; very weak, requires cues for full elbow extension.  General Comments        Pertinent Vitals/Pain Pain Assessment: 0-10 Pain Score: 0-No pain Pain Location: L Hip  Pain Intervention(s): Limited activity within patient's tolerance;Monitored during session;Premedicated before session (PCA DC a PT session)    Home Living                      Prior Function            PT Goals  (current goals can now be found in the care plan section) Acute Rehab PT Goals Patient Stated Goal: Pt would like to regain strength to return to previously active PLOF.  PT Goal Formulation: With patient/family Time For Goal Achievement: 10/18/14 Potential to Achieve Goals: Good Progress towards PT goals: Progressing toward goals    Frequency  7X/week    PT Plan Frequency needs to be updated    Co-evaluation             End of Session Equipment Utilized During Treatment: Gait belt Activity Tolerance: Patient tolerated treatment well Patient left: in bed;with SCD's reapplied;with call bell/phone within reach;with bed alarm set     Time: 1310-1339 PT Time Calculation (min) (ACUTE ONLY): 29 min  Charges:  $Therapeutic Exercise: 8-22 mins $Therapeutic Activity: 8-22 mins                    G Codes:      Ishaan Villamar C October 17, 2014, 3:23 PM  3:25 PM  Etta Grandchild, PT, DPT [place] License # [id number]

## 2014-10-06 ENCOUNTER — Inpatient Hospital Stay (HOSPITAL_COMMUNITY): Payer: Medicare Other

## 2014-10-06 DIAGNOSIS — E119 Type 2 diabetes mellitus without complications: Secondary | ICD-10-CM | POA: Diagnosis present

## 2014-10-06 DIAGNOSIS — R509 Fever, unspecified: Secondary | ICD-10-CM | POA: Diagnosis not present

## 2014-10-06 LAB — URINALYSIS, ROUTINE W REFLEX MICROSCOPIC
BILIRUBIN URINE: NEGATIVE
GLUCOSE, UA: NEGATIVE mg/dL
KETONES UR: NEGATIVE mg/dL
NITRITE: POSITIVE — AB
PH: 6 (ref 5.0–8.0)
Protein, ur: 30 mg/dL — AB
Specific Gravity, Urine: 1.02 (ref 1.005–1.030)
Urobilinogen, UA: 0.2 mg/dL (ref 0.0–1.0)

## 2014-10-06 LAB — CBC WITH DIFFERENTIAL/PLATELET
BASOS ABS: 0 10*3/uL (ref 0.0–0.1)
Basophils Relative: 0 % (ref 0–1)
EOS PCT: 1 % (ref 0–5)
Eosinophils Absolute: 0.1 10*3/uL (ref 0.0–0.7)
HCT: 23.1 % — ABNORMAL LOW (ref 36.0–46.0)
Hemoglobin: 7.7 g/dL — ABNORMAL LOW (ref 12.0–15.0)
LYMPHS ABS: 1.6 10*3/uL (ref 0.7–4.0)
Lymphocytes Relative: 17 % (ref 12–46)
MCH: 30.6 pg (ref 26.0–34.0)
MCHC: 33.3 g/dL (ref 30.0–36.0)
MCV: 91.7 fL (ref 78.0–100.0)
Monocytes Absolute: 0.5 10*3/uL (ref 0.1–1.0)
Monocytes Relative: 5 % (ref 3–12)
NEUTROS ABS: 7.1 10*3/uL (ref 1.7–7.7)
NEUTROS PCT: 77 % (ref 43–77)
PLATELETS: 137 10*3/uL — AB (ref 150–400)
RBC: 2.52 MIL/uL — AB (ref 3.87–5.11)
RDW: 13.2 % (ref 11.5–15.5)
WBC: 9.3 10*3/uL (ref 4.0–10.5)

## 2014-10-06 LAB — WOUND CULTURE: Culture: NO GROWTH

## 2014-10-06 LAB — GLUCOSE, CAPILLARY
GLUCOSE-CAPILLARY: 127 mg/dL — AB (ref 65–99)
Glucose-Capillary: 135 mg/dL — ABNORMAL HIGH (ref 65–99)
Glucose-Capillary: 155 mg/dL — ABNORMAL HIGH (ref 65–99)
Glucose-Capillary: 181 mg/dL — ABNORMAL HIGH (ref 65–99)
Glucose-Capillary: 164 mg/dL — ABNORMAL HIGH (ref 65–99)
Glucose-Capillary: 180 mg/dL — ABNORMAL HIGH (ref 65–99)

## 2014-10-06 LAB — BASIC METABOLIC PANEL
ANION GAP: 8 (ref 5–15)
BUN: 26 mg/dL — ABNORMAL HIGH (ref 6–20)
CALCIUM: 8.4 mg/dL — AB (ref 8.9–10.3)
CO2: 27 mmol/L (ref 22–32)
CREATININE: 1.68 mg/dL — AB (ref 0.44–1.00)
Chloride: 103 mmol/L (ref 101–111)
GFR calc Af Amer: 32 mL/min — ABNORMAL LOW (ref >60)
GFR, EST NON AFRICAN AMERICAN: 28 mL/min — AB (ref >60)
Glucose, Bld: 134 mg/dL — ABNORMAL HIGH (ref 65–99)
Potassium: 3.8 mmol/L (ref 3.5–5.1)
SODIUM: 138 mmol/L (ref 135–145)

## 2014-10-06 LAB — HEMOGLOBIN AND HEMATOCRIT, BLOOD
HCT: 24 % — ABNORMAL LOW (ref 36.0–46.0)
Hemoglobin: 7.9 g/dL — ABNORMAL LOW (ref 12.0–15.0)

## 2014-10-06 LAB — URINE MICROSCOPIC-ADD ON

## 2014-10-06 LAB — PREPARE RBC (CROSSMATCH)

## 2014-10-06 MED ORDER — AMLODIPINE BESYLATE 5 MG PO TABS
10.0000 mg | ORAL_TABLET | Freq: Every day | ORAL | Status: DC
  Administered 2014-10-06 – 2014-10-07 (×2): 10 mg via ORAL
  Filled 2014-10-06: qty 2

## 2014-10-06 MED ORDER — OFLOXACIN 0.3 % OP SOLN
1.0000 [drp] | Freq: Four times a day (QID) | OPHTHALMIC | Status: DC
  Administered 2014-10-06 – 2014-10-07 (×3): 1 [drp] via OPHTHALMIC
  Filled 2014-10-06: qty 5

## 2014-10-06 MED ORDER — METOPROLOL SUCCINATE ER 25 MG PO TB24
25.0000 mg | ORAL_TABLET | Freq: Every day | ORAL | Status: DC
  Administered 2014-10-06 – 2014-10-07 (×2): 25 mg via ORAL
  Filled 2014-10-06: qty 1

## 2014-10-06 MED ORDER — SODIUM CHLORIDE 0.9 % IV SOLN
Freq: Once | INTRAVENOUS | Status: AC
  Administered 2014-10-06: 16:00:00 via INTRAVENOUS

## 2014-10-06 MED ORDER — METOPROLOL SUCCINATE ER 50 MG PO TB24: 50.0000 mg | ORAL_TABLET | Freq: Every day | ORAL | Status: DC

## 2014-10-06 NOTE — Clinical Social Work Note (Signed)
CSW updated [facility] on pt. Aware of probable d/c Saturday.   Benay Pike, [place]

## 2014-10-06 NOTE — Progress Notes (Signed)
Physical Therapy Treatment Patient Details Name: Marie Hunt MRN: [id number] DOB: [birth year] Today's Date: 10/06/2014    History of Present Illness Pt is a 81yo black female who sustained a fall and Left hip fracture. Pt elected to undergo L THA and presents today on POD2     PT Comments    Pt tolerating treatment session well, motivated and able to complete entire PT sesssion as planned. Pt continues to make progress toward goals as evidenced by improved level of I c HEP. Pt's greatest limitation continues to be generalized UE weakness which continues to limit ability to perform safe AMB at TTWB at baseline function. Patient presenting with impairment of strength, pain, range of motion, balance, and activity tolerance, limiting ability to perform ADL and mobility tasks at  baseline level of function. Patient will benefit from skilled intervention to address the above impairments and limitations, in order to restore to prior level of function, improve patient safety upon discharge, and to decrease caregiver burden.    Follow Up Recommendations  SNF     Equipment Recommendations  None recommended by PT    Recommendations for Other Services       Precautions / Restrictions Precautions Precautions: Posterior Hip Precaution Booklet Issued: Yes (comment) Precaution Comments: Still unable to recite any precautions or TTWB status.  Required Braces or Orthoses: Other Brace/Splint Other Brace/Splint: pillow wedge Restrictions Weight Bearing Restrictions: Yes LLE Weight Bearing: Touchdown weight bearing    Mobility  Bed Mobility Overal bed mobility: Needs Assistance Bed Mobility: Supine to Sit     Supine to sit: Min guard     General bed mobility comments: Much stronger today.   Transfers Overall transfer level: Needs assistance Equipment used: Rolling walker (2 wheeled) Transfers: Sit to/from Stand Sit to Stand: From elevated surface;Min guard             Ambulation/Gait Ambulation/Gait assistance: Min guard Ambulation Distance (Feet): 4 Feet Assistive device: Rolling walker (2 wheeled)     Gait velocity interpretation: <1.8 ft/sec, indicative of risk for recurrent falls General Gait Details: Given heavy cues for sequencing and safe use of RW, better able to hop today and maintain TTWB.    Stairs            Wheelchair Mobility    Modified Rankin (Stroke Patients Only)       Balance Overall balance assessment: No apparent balance deficits (not formally assessed) Sitting-balance support: Single extremity supported                                Cognition Arousal/Alertness: Awake/alert Behavior During Therapy: WFL for tasks assessed/performed Overall Cognitive Status: Within Functional Limits for tasks assessed       Memory: Decreased recall of precautions              Exercises Total Joint Exercises Gluteal Sets: Both;10 reps;Supine;Strengthening Short Arc Quad: Strengthening;Left;Supine;15 reps (supervision only. ) Heel Slides: Left;Strengthening;Supine;15 reps (minA only. ) Hip ABduction/ADduction: Strengthening;Left;Supine;15 reps (minA only. )    General Comments        Pertinent Vitals/Pain Pain Assessment: 0-10 Pain Score: 0-No pain    Home Living                      Prior Function            PT Goals (current goals can now be found in the care plan section) Acute Rehab  PT Goals Patient Stated Goal: Pt would like to regain strength to return to previously active PLOF.  PT Goal Formulation: With patient/family Time For Goal Achievement: 10/18/14 Potential to Achieve Goals: Good Progress towards PT goals: Progressing toward goals    Frequency  7X/week    PT Plan Frequency needs to be updated    Co-evaluation             End of Session Equipment Utilized During Treatment: Gait belt Activity Tolerance: Patient tolerated treatment well Patient left: in  chair;with call bell/phone within reach;with chair alarm set     Time: [id number] PT Time Calculation (min) (ACUTE ONLY): 18 min  Charges:  $Therapeutic Activity: 8-22 mins                    G Codes:      Buccola,Allan C 16-Oct-2014, 9:57 AM  9:58 AM  Etta Grandchild, PT, DPT [place] License # [id number]

## 2014-10-06 NOTE — Care Management Note (Signed)
Case Management Note  Patient Details  Name: Marie Hunt MRN: [id number] Date of Birth: 1932/12/12  Expected Discharge Date:   10/07/2014               Expected Discharge Plan:  [place]  In-House Referral:  Clinical Social Work  Discharge planning Services  CM Consult  Post Acute Care Choice:  NA Choice offered to:  NA  DME Arranged:    DME Agency:     HH Arranged:    [place] Agency:     Status of Service:  Completed, signed off  Medicare Important Message Given:  Yes-third notification given Date Medicare IM Given:    Medicare IM give by:    Date Additional Medicare IM Given:    Additional Medicare Important Message give by:     If discussed at [place] of Stay Meetings, dates discussed:    Additional Comments: Pt discharging to SNF. CSW has arranged for placement. No CM needs.  Sherald Barge, RN 10/06/2014, 1:30 PM

## 2014-10-06 NOTE — Care Management Important Message (Signed)
Important Message  Patient Details  Name: Marie Hunt MRN: [id number] Date of Birth: December 05, 1932   Medicare Important Message Given:  Yes-third notification given    Sherald Barge, RN 10/06/2014, 1:30 PM

## 2014-10-06 NOTE — Progress Notes (Signed)
Subjective: 3 Days Post-Op Procedure(s) (LRB): ARTHROPLASTY BIPOLAR HIP (HEMIARTHROPLASTY) (Left) Patient reports pain as mild.    Objective: Vital signs in last 24 hours: Temp:  [98.6 F (37 C)-100.6 F (38.1 C)] 100.6 F (38.1 C) (08/12 0400) Pulse Rate:  [82-98] 98 (08/12 0400) Resp:  [16-18] 18 (08/12 0400) BP: (147-165)/(46-70) 165/70 mmHg (08/12 0400) SpO2:  [97 %-100 %] 100 % (08/12 0400)  Intake/Output from previous day: 08/11 0701 - 08/12 0700 In: 240 [P.O.:240] Out: 400 [Urine:400] Intake/Output this shift:     Recent Labs  10/03/14 1505 10/04/14 0629 10/05/14 0644 10/06/14 0558  HGB 10.4* 9.8* 9.0* 7.7*    Recent Labs  10/05/14 0644 10/06/14 0558  WBC 12.8* 9.3  RBC 2.95* 2.52*  HCT 27.0* 23.1*  PLT 128* 137*    Recent Labs  10/05/14 0644 10/06/14 0558  NA 138 138  K 3.8 3.8  CL 102 103  CO2 29 27  BUN 24* 26*  CREATININE 1.67* 1.68*  GLUCOSE 142* 134*  CALCIUM 8.6* 8.4*   No results for input(s): LABPT, INR in the last 72 hours.  Neurologically intact Neurovascular intact Sensation intact distally Intact pulses distally Dorsiflexion/Plantar flexion intact Incision: no drainage  Assessment/Plan: 3 Days Post-Op Procedure(s) (LRB): ARTHROPLASTY BIPOLAR HIP (HEMIARTHROPLASTY) (Left) Plan for discharge tomorrow  To get blood today.  Marie Hunt 10/06/2014, 9:53 AM

## 2014-10-06 NOTE — Progress Notes (Signed)
[facility] PROGRESS NOTE  Marie Hunt [id number] DOB: 1932-03-31 DOA: 10/02/2014 PCP: Monico Blitz, MD  Assessment/Plan: 1. Left hip fracture, secondary to mechanical fall. S/p operative repair by Dr. Luna Glasgow, Orthopedic Surgeon on 8/9. Pt is currently hemodynamically stable post surgery. Pain appears to be reasonably controlled on oral pain meds. Will need continued PT. Recommending posterior hip precautions and weight bearing restrictions, and SNF on d/c.  2. Fever, etiology unclear.  Possibly postop fever/atelectasis.  Will check CXR and UA.  Hold ABX until source of infection is identified.  Continue to monitor. 3.  HTN. BP has been running high. Increased Norvasc to 10mg /daily.  Will continue Metoprolol and continue to monitor. 4. CAD. Seen by cardiology who reports she had overall low risk for this procedure from cardiac stand point. ECHO completed, unremarkable. No reports of chest pain at this time.  5. DM type 2. Continue SSI.  Glucose trending downward to stable range. 6. Chronic anemia. S/p 1 unit PRBC. Hgb still trending down since surgery.  Current Hgb is 7.7.  No obvious signs of bleeding.  Continue to monitor.  Repeat CBC in a.m. and check stool for occult blood. 7. Possible CKD Stage IV. Creatinine remains high, but continues trending down. Records have been requested from PCP, but not received. She reportedly saw Nephrology in the past. Will continue to follow renal function.   Code Status: Full DVT prophylaxis: Lovenox and SCDs Family Communication: Spoke with patient and she understands care plan with no concerns at this time. Disposition Plan: will d/c with SNF placement, possibly 8/13.    Consultants:  Orthopedic surgery- Dr. Luna Glasgow  Cardiology- Herminio Commons, MD  PT   Procedures:  Transfusion- 1 unit PRBC  Antibiotics:    HPI/Subjective:  Pt reports feels better this a.m.  Pain pills helpful.  Felt like she needed to have BM yesterday,  but didn't.  No n/v/d or SOB.  Objective: Filed Vitals:   10/06/14 0400  BP: 165/70  Pulse: 98  Temp: 100.6 F (38.1 C)  Resp: 18    Intake/Output Summary (Last 24 hours) at 10/06/14 0838 Last data filed at 10/06/14 0241  Gross per 24 hour  Intake    240 ml  Output    400 ml  Net   -160 ml   Filed Weights   10/02/14 1749 10/02/14 2314 10/03/14 1230  Weight: 66.225 kg (146 lb) 65 kg (143 lb 4.8 oz) 64.864 kg (143 lb)    Exam:   General: Appears calm and comfortable.  Cardiovascular: RRR, no r/g. 3/6 SEM.  Respiratory: CTAB, no w/r/r, normal breathing effort.   Musculoskeletal: No B/L edema. DP and PT pulses 2+.  Psychiatric: Normal mood and affect, normal speech  Neurologic: A&Ox3  Data Reviewed: Basic Metabolic Panel:  Recent Labs Lab 10/02/14 2000 10/03/14 0613 10/04/14 0629 10/05/14 0644 10/06/14 0558  NA 143 141 139 138 138  K 4.0 4.0 3.7 3.8 3.8  CL 102 103 100* 102 103  CO2 25 30 26 29 27   GLUCOSE 117* 131* 211* 142* 134*  BUN 31* 29* 24* 24* 26*  CREATININE 2.26* 2.21* 1.88* 1.67* 1.68*  CALCIUM 9.9 9.1 8.9 8.6* 8.4*   Liver Function Tests: No results for input(s): AST, ALT, ALKPHOS, BILITOT, PROT, ALBUMIN in the last 168 hours. No results for input(s): LIPASE, AMYLASE in the last 168 hours. No results for input(s): AMMONIA in the last 168 hours. CBC:  Recent Labs Lab 10/02/14 2000 10/03/14 [id number] 10/03/14 1505 10/04/14 0629 10/05/14  [id number] 10/06/14 0558  WBC 12.8* 9.3  --  12.4* 12.8* 9.3  NEUTROABS 9.2*  --   --  10.1* 9.7* 7.1  HGB 10.5* 9.4* 10.4* 9.8* 9.0* 7.7*  HCT 32.6* 29.2* 31.8* 30.3* 27.0* 23.1*  MCV 93.1 93.0  --  92.1 91.5 91.7  PLT 176 158  --  153 128* 137*    CBG:  Recent Labs Lab 10/05/14 1633 10/05/14 2003 10/05/14 2353 10/06/14 0308 10/06/14 0739  GLUCAP 198* 195* 155* 135* 127*    Recent Results (from the past 240 hour(s))  Urine culture     Status: None   Collection Time: 10/02/14  8:29 PM  Result  Value Ref Range Status   Specimen Description URINE, CATHETERIZED  Final   Special Requests NONE  Final   Culture   Final    NO GROWTH 1 DAY Performed at [facility]    Report Status 10/04/2014 FINAL  Final  Surgical pcr screen     Status: None   Collection Time: 10/03/14  9:15 AM  Result Value Ref Range Status   MRSA, PCR NEGATIVE NEGATIVE Final   Staphylococcus aureus NEGATIVE NEGATIVE Final    Comment:        The Xpert SA Assay (FDA approved for NASAL specimens in patients over 76 years of age), is one component of a comprehensive surveillance program.  Test performance has been validated by [facility] for patients greater than or equal to 34 year old. It is not intended to diagnose infection nor to guide or monitor treatment.   Wound culture     Status: None (Preliminary result)   Collection Time: 10/03/14  1:50 PM  Result Value Ref Range Status   Specimen Description WOUND LEFT HIP CAPSULE  Final   Special Requests ANCEF 2gm  Final   Gram Stain   Final    RARE WBC PRESENT, PREDOMINANTLY PMN NO SQUAMOUS EPITHELIAL CELLS SEEN NO ORGANISMS SEEN Performed at [facility]    Culture   Final    NO GROWTH 1 DAY Performed at [facility]    Report Status PENDING  Incomplete     Studies: No results found.  Scheduled Meds: . amLODipine  5 mg Oral Daily  . enoxaparin (LOVENOX) injection  30 mg Subcutaneous Q24H  . insulin aspart  0-9 Units Subcutaneous 6 times per day  . ketorolac  1 drop Right Eye BID  . metoprolol succinate  25 mg Oral Daily  . prednisoLONE acetate  1 drop Right Eye TID   Continuous Infusions:    Principal Problem:   Hip fracture Active Problems:   Hypertension   Carotid artery disease   Type 2 diabetes mellitus   Hip fx    Time spent: 25 minutes.     [name], MD [facility] Pager [phone]. If 7PM-7AM, please contact night-coverage at [email or web address], password [facility] 10/06/2014, 8:38 AM  LOS: 4  days     I, Marie Hunt, acting as scribe, recorded this note contemporaneously in the presence of Dr. [name], M.D. on 10/06/2014 at 8:38 AM.   Attending:  I have reviewed the above documentation for accuracy and completeness, and I agree with the above.  [name], MD

## 2014-10-07 ENCOUNTER — Inpatient Hospital Stay
Admission: RE | Admit: 2014-10-07 | Discharge: 2014-10-27 | Disposition: A | Discharge status: Home / Self Care | Payer: Medicare Other | Source: Ambulatory Visit | Attending: Internal Medicine | Admitting: Internal Medicine

## 2014-10-07 DIAGNOSIS — N390 Urinary tract infection, site not specified: Secondary | ICD-10-CM | POA: Diagnosis present

## 2014-10-07 DIAGNOSIS — D649 Anemia, unspecified: Secondary | ICD-10-CM | POA: Diagnosis present

## 2014-10-07 DIAGNOSIS — M7989 Other specified soft tissue disorders: Principal | ICD-10-CM

## 2014-10-07 LAB — GLUCOSE, CAPILLARY
GLUCOSE-CAPILLARY: 164 mg/dL — AB (ref 65–99)
Glucose-Capillary: 157 mg/dL — ABNORMAL HIGH (ref 65–99)
Glucose-Capillary: 154 mg/dL — ABNORMAL HIGH (ref 65–99)

## 2014-10-07 LAB — CBC
HEMATOCRIT: 29.1 % — AB (ref 36.0–46.0)
HEMOGLOBIN: 9.8 g/dL — AB (ref 12.0–15.0)
MCH: 29 pg (ref 26.0–34.0)
MCHC: 33.7 g/dL (ref 30.0–36.0)
MCV: 86.1 fL (ref 78.0–100.0)
Platelets: 135 10*3/uL — ABNORMAL LOW (ref 150–400)
RBC: 3.38 MIL/uL — ABNORMAL LOW (ref 3.87–5.11)
RDW: 17.3 % — ABNORMAL HIGH (ref 11.5–15.5)
WBC: 7.3 10*3/uL (ref 4.0–10.5)

## 2014-10-07 LAB — TYPE AND SCREEN
ABO/RH(D): O POS
ANTIBODY SCREEN: NEGATIVE
UNIT DIVISION: 0
UNIT DIVISION: 0

## 2014-10-07 LAB — BASIC METABOLIC PANEL WITH GFR
Anion gap: 8 (ref 5–15)
BUN: 26 mg/dL — ABNORMAL HIGH (ref 6–20)
CO2: 26 mmol/L (ref 22–32)
Calcium: 8.4 mg/dL — ABNORMAL LOW (ref 8.9–10.3)
Chloride: 104 mmol/L (ref 101–111)
Creatinine, Ser: 1.53 mg/dL — ABNORMAL HIGH (ref 0.44–1.00)
GFR calc Af Amer: 36 mL/min — ABNORMAL LOW
GFR calc non Af Amer: 31 mL/min — ABNORMAL LOW
Glucose, Bld: 161 mg/dL — ABNORMAL HIGH (ref 65–99)
Potassium: 3.7 mmol/L (ref 3.5–5.1)
Sodium: 138 mmol/L (ref 135–145)

## 2014-10-07 MED ORDER — AMLODIPINE BESYLATE 10 MG PO TABS: 10.0000 mg | ORAL_TABLET | Freq: Every day | ORAL | Status: DC

## 2014-10-07 MED ORDER — FUROSEMIDE 40 MG PO TABS: 40.0000 mg | ORAL_TABLET | Freq: Every day | ORAL | Status: DC | PRN

## 2014-10-07 MED ORDER — CIPROFLOXACIN HCL 250 MG PO TABS: 250.0000 mg | ORAL_TABLET | Freq: Two times a day (BID) | ORAL | Status: DC

## 2014-10-07 MED ORDER — ENOXAPARIN SODIUM 30 MG/0.3ML ~~LOC~~ SOLN: 30.0000 mg | SUBCUTANEOUS | Status: DC

## 2014-10-07 MED ORDER — CIPROFLOXACIN HCL 250 MG PO TABS
250.0000 mg | ORAL_TABLET | Freq: Two times a day (BID) | ORAL | Status: DC
  Administered 2014-10-07: 250 mg via ORAL
  Filled 2014-10-07: qty 1

## 2014-10-07 MED ORDER — HYDROCODONE-ACETAMINOPHEN 5-325 MG PO TABS: 1.0000 | ORAL_TABLET | Freq: Four times a day (QID) | ORAL | Status: DC | PRN

## 2014-10-07 NOTE — Progress Notes (Signed)
Report called to the [facility]. 

## 2014-10-07 NOTE — Discharge Summary (Signed)
Physician Discharge Summary  Marie Hunt [id number] DOB: Sep 14, 1932 DOA: 10/02/2014  PCP: Monico Blitz, MD  Admit date: 10/02/2014 Discharge date: 10/07/2014  Time spent: 35 minutes  Recommendations for Outpatient Follow-up:  1. Repeat CBC and BMP in one week 2. Remove staples on left hip on August 19th and replace with steri strip 3. Follow up with Dr. Luna Glasgow in one month with XR of left hip prior to appointment 4. Use walker and have weight bearing as tolerated to the left leg 5. Follow up urine culture    Discharge Diagnoses:  Principal Problem:   Hip fracture Active Problems:   Hypertension   Carotid artery disease   Type 2 diabetes mellitus   Hip fx   Fever   Type 2 diabetes mellitus without complication   UTI (urinary tract infection)   Anemia   Discharge Condition: Improved  Diet recommendation: Heart healthy   Filed Weights   10/02/14 1749 10/02/14 2314 10/03/14 1230  Weight: 66.225 kg (146 lb) 65 kg (143 lb 4.8 oz) 64.864 kg (143 lb)    History of present illness:  81 yo female had cataract surgery and went home and slipped and fell on something and hurt her left hip. No LOC. Found to have left hip fracture. Lives alone and very independent. Normal state of health prior to this accident except for the cataract surgery today.  Hospital Course:  Patient was admitted after a fall with her left hip fractured.s/p operative repair by Dr. Luna Glasgow, Orthopedic Surgeon on 8/9. Pt was hemodynamically stable post surgery. She will need continued PT. posterior hip precautions and weight bearing restrictions were recommended.  1. Fever which was likely related to her UTI.  She was started on Cipro. Follow up UC 2. HTN. Her BP has been running high. Norvasc was increased to 10mg /daily. She will continue Metoprolol. Continue to titrate medications as outpatient.  3. CAD. She was seen by cardiology who reports she had overall low risk for this procedure from cardiac  stand point. An ECHO was completed and was unremarkable. There were no reports of chest pain during stay. 4. DM type 2. Resume home medications  5. Chronic anemia. She was transfused 3 units of PRBC. Hgb is stableat discharge No obvious signs of bleeding while in the hospital. Repeat CBC in one week to follow hgb.  6. Possible CKD Stage III - IV. Her Creatinine was high on admission, but continued to trend down during her hospital course. Records were requested from PCP, but were not received. She reportedly saw Nephrology in the past. Creatinine appears to be stable.  Repeat BMP in one week to ensure stability.  7. Recent cataract surgery. Continue IV drops per opthalmology.    Procedures:  Transfusion - 3  unit of PBRC  Consultations:  Orthopedics  Cardiology  PT  Discharge Exam: Filed Vitals:   10/07/14 0624  BP: 154/62  Pulse: 75  Temp: 98.4 F (36.9 C)  Resp: 20    General: afebrile, appears calm and comfortable, sitting up in chair  Cardiovascular: regular rate and rhythm, no murmurs, rubs or gallops Respiratory: normal work of breathing, clear to ascultation bilaterally   Discharge Instructions    Current Discharge Medication List    START taking these medications   Details  ciprofloxacin (CIPRO) 250 MG tablet Take 1 tablet (250 mg total) by mouth 2 (two) times daily. Qty: 10 tablet, Refills: 0    enoxaparin (LOVENOX) 30 MG/0.3ML injection Inject 0.3 mLs (30 mg total) into the  skin daily. Until 9/5 Qty: 0 Syringe    HYDROcodone-acetaminophen (NORCO/VICODIN) 5-325 MG per tablet Take 1-2 tablets by mouth every 6 (six) hours as needed for moderate pain. Qty: 30 tablet, Refills: 0      CONTINUE these medications which have CHANGED   Details  amLODipine (NORVASC) 10 MG tablet Take 1 tablet (10 mg total) by mouth daily.    furosemide (LASIX) 40 MG tablet Take 1 tablet (40 mg total) by mouth daily as needed for fluid. Qty: 30 tablet      CONTINUE these  medications which have NOT CHANGED   Details  aspirin 81 MG tablet Take 81 mg by mouth daily.      B Complex Vitamins (B-COMPLEX/B-12 PO) Take 1,000 mg by mouth daily.    calcium-vitamin D (OSCAL WITH D) 500-200 MG-UNIT per tablet Take 1 tablet by mouth daily.      Calcium-Vitamin D 600-200 MG-UNIT per tablet Take 1 tablet by mouth daily.    ferrous sulfate 325 (65 FE) MG tablet Take 325 mg by mouth daily with breakfast.      folic acid (FOLVITE) 1 MG tablet Take 1 mg by mouth daily.    glimepiride (AMARYL) 2 MG tablet Take 2 mg by mouth daily before breakfast.      ketorolac (ACULAR) 0.5 % ophthalmic solution Apply 1 drop to eye 2 (two) times daily. To start 3 days prior to operation (10/02/14-scheduled date for operation), then continue as directed    lidocaine (XYLOCAINE) 5 % ointment Apply 1 application topically 3 (three) times daily as needed. For back pain    metoprolol succinate (TOPROL-XL) 25 MG 24 hr tablet Take 25 mg by mouth daily.      ofloxacin (OCUFLOX) 0.3 % ophthalmic solution Apply 1 drop to eye 4 (four) times daily. *to start 3 days prior to operation (10/02/14-scheduled operation) and to continue for 1 week after the surgery    omeprazole (PRILOSEC) 20 MG capsule Take 20 mg by mouth 2 (two) times daily.     prednisoLONE acetate (PRED FORTE) 1 % ophthalmic suspension Apply 1 drop to eye 3 (three) times daily. *To start following surgical procedure (10/02/14--scheduled surgery date) and continue    simvastatin (ZOCOR) 20 MG tablet Take 20 mg by mouth at bedtime.        STOP taking these medications     diclofenac (VOLTAREN) 75 MG EC tablet      lisinopril (PRINIVIL,ZESTRIL) 20 MG tablet      metFORMIN (GLUCOPHAGE) 500 MG tablet        Allergies  Allergen Reactions  . Codeine Nausea And Vomiting      The results of significant diagnostics from this hospitalization (including imaging, microbiology, ancillary and laboratory) are listed below for reference.     Significant Diagnostic Studies: Dg Chest 1 View  10/02/2014   CLINICAL DATA:  Status post slip and fall today.  EXAM: CHEST  1 VIEW  COMPARISON:  PA and lateral chest 08/24/2009.  FINDINGS: The lungs are clear. Heart size is normal. No pneumothorax or pleural effusion. No focal bony abnormality.  IMPRESSION: No acute disease.   Electronically Signed   By: Inge Rise M.D.   On: 10/02/2014 19:54   Dg Chest Port 1 View  10/06/2014   CLINICAL DATA:  Fever  EXAM: PORTABLE CHEST - 1 VIEW  COMPARISON:  10/02/2014  FINDINGS: There are bilateral mild chronic bronchitic changes. There is no focal parenchymal opacity. There is no pleural effusion or pneumothorax. There  is mild stable cardiomegaly. There is thoracic aortic atherosclerosis.  The osseous structures are unremarkable.  IMPRESSION: No active disease.   Electronically Signed   By: Kathreen Devoid   On: 10/06/2014 11:05   Dg Knee Complete 4 Views Left  10/02/2014   CLINICAL DATA:  Pain following fall  EXAM: LEFT KNEE - COMPLETE 4+ VIEW  COMPARISON:  None.  FINDINGS: Frontal, bilateral oblique, and lateral images were obtained. There is no demonstrable fracture or dislocation. No appreciable joint effusion. There is moderate generalized joint space narrowing. No erosive change. There are foci of atherosclerotic calcification.  IMPRESSION: Generalized osteoarthritic change. No fracture or joint effusion. Areas of atherosclerotic calcification.   Electronically Signed   By: Lowella Grip III M.D.   On: 10/02/2014 19:54   Dg Hip Port Unilat With Pelvis 1v Left  10/03/2014   CLINICAL DATA:  Total hip replacement.  EXAM: DG HIP (WITH OR WITHOUT PELVIS) 1V PORT LEFT  COMPARISON:  10/02/2014.  FINDINGS: LEFT hip hemiarthroplasty is present. Surgical defect in the lateral soft tissues. Atherosclerosis. Hip appears located. Single AP view of the LEFT hip is submitted for interpretation.  IMPRESSION: LEFT hip hemiarthroplasty at intraoperative radiographs  demonstrating LEFT hip hemiarthroplasty.   Electronically Signed   By: Dereck Ligas M.D.   On: 10/03/2014 14:29   Dg Hip Unilat With Pelvis 2-3 Views Left  10/02/2014   CLINICAL DATA:  Status post fall today. Left hip pain. Initial encounter.  EXAM: DG HIP (WITH OR WITHOUT PELVIS) 2-3V LEFT  COMPARISON:  None.  FINDINGS: The patient has an acute subcapital left hip fracture. No other acute bony or joint abnormality is identified. Bones are osteopenic. Lower lumbar spondylosis is noted.  IMPRESSION: Subcapital fracture left hip.   Electronically Signed   By: Inge Rise M.D.   On: 10/02/2014 19:18    Microbiology: Recent Results (from the past 240 hour(s))  Urine culture     Status: None   Collection Time: 10/02/14  8:29 PM  Result Value Ref Range Status   Specimen Description URINE, CATHETERIZED  Final   Special Requests NONE  Final   Culture   Final    NO GROWTH 1 DAY Performed at [facility]    Report Status 10/04/2014 FINAL  Final  Surgical pcr screen     Status: None   Collection Time: 10/03/14  9:15 AM  Result Value Ref Range Status   MRSA, PCR NEGATIVE NEGATIVE Final   Staphylococcus aureus NEGATIVE NEGATIVE Final    Comment:        The Xpert SA Assay (FDA approved for NASAL specimens in patients over 66 years of age), is one component of a comprehensive surveillance program.  Test performance has been validated by [facility] for patients greater than or equal to 39 year old. It is not intended to diagnose infection nor to guide or monitor treatment.   Wound culture     Status: None   Collection Time: 10/03/14  1:50 PM  Result Value Ref Range Status   Specimen Description WOUND LEFT HIP CAPSULE  Final   Special Requests ANCEF 2gm  Final   Gram Stain   Final    RARE WBC PRESENT, PREDOMINANTLY PMN NO SQUAMOUS EPITHELIAL CELLS SEEN NO ORGANISMS SEEN Performed at [facility]    Culture   Final    NO GROWTH 2 DAYS Performed at [facility]    Report Status 10/06/2014 FINAL  Final  Labs: Basic Metabolic Panel:  Recent Labs Lab 10/03/14 0613 10/04/14 0629 10/05/14 0644 10/06/14 0558 10/07/14 0607  NA 141 139 138 138 138  K 4.0 3.7 3.8 3.8 3.7  CL 103 100* 102 103 104  CO2 30 26 29 27 26   GLUCOSE 131* 211* 142* 134* 161*  BUN 29* 24* 24* 26* 26*  CREATININE 2.21* 1.88* 1.67* 1.68* 1.53*  CALCIUM 9.1 8.9 8.6* 8.4* 8.4*   CBC:  Recent Labs Lab 10/02/14 2000 10/03/14 0613  10/04/14 0629 10/05/14 0644 10/06/14 0558 10/06/14 1523 10/07/14 0607  WBC 12.8* 9.3  --  12.4* 12.8* 9.3  --  7.3  NEUTROABS 9.2*  --   --  10.1* 9.7* 7.1  --   --   HGB 10.5* 9.4*  < > 9.8* 9.0* 7.7* 7.9* 9.8*  HCT 32.6* 29.2*  < > 30.3* 27.0* 23.1* 24.0* 29.1*  MCV 93.1 93.0  --  92.1 91.5 91.7  --  86.1  PLT 176 158  --  153 128* 137*  --  135*  < > = values in this interval not displayed.  CBG:  Recent Labs Lab 10/06/14 1631 10/06/14 2024 10/07/14 0004 10/07/14 0432 10/07/14 0742  GLUCAP 164* 180* 164* 157* 154*       Signed:  [name], M.D.   [facility] 10/07/2014, 9:32 AM   I, Salvadore Oxford, acting a scribe, recorded this note contemporaneously in the presence of Dr. [name], M.D. On 10/07/2014 at 9:32 AM   I have reviewed the above documentation for accuracy and completeness, and I agree with the above.  Lamekia Nolden

## 2014-10-09 ENCOUNTER — Non-Acute Institutional Stay (SKILLED_NURSING_FACILITY): Payer: Medicare Other | Admitting: Internal Medicine

## 2014-10-09 DIAGNOSIS — N183 Chronic kidney disease, stage 3 unspecified: Secondary | ICD-10-CM

## 2014-10-09 DIAGNOSIS — E0822 Diabetes mellitus due to underlying condition with diabetic chronic kidney disease: Secondary | ICD-10-CM

## 2014-10-09 DIAGNOSIS — D62 Acute posthemorrhagic anemia: Secondary | ICD-10-CM

## 2014-10-09 DIAGNOSIS — N179 Acute kidney failure, unspecified: Secondary | ICD-10-CM

## 2014-10-09 DIAGNOSIS — S72142D Displaced intertrochanteric fracture of left femur, subsequent encounter for closed fracture with routine healing: Secondary | ICD-10-CM

## 2014-10-09 LAB — URINE CULTURE: Culture: >=100000

## 2014-10-10 NOTE — Progress Notes (Addendum)
Patient ID: Marie Hunt, female   DOB: Jul 30, 1932, 81 y.o.   MRN: [id number]                HISTORY & PHYSICAL  DATE:  10/09/2014          FACILITY: [facility]                         LEVEL OF CARE:   SNF   CHIEF COMPLAINT:  Admission to SNF, post stay at [facility], 10/02/2014 through 10/07/2014.       HISTORY OF PRESENT ILLNESS:  This is a 81 year-old woman who lives independently in [place].    She had had cataract surgery on her right eye, was sent home and fell and noted to have a left hip fracture.  She has no prior history of falls and states that her previous DEXA scans have been within the normal limits.    She was seen preoperatively by Cardiology.  An echo was completed.  There were no issues.  The echocardiogram showed mild aortic stenosis and mild aortic regurgitation.  Her left ventricular ejection fraction was 50-55%.    Issues during the hospitalization included a fever which was felt to be due to a UTI.  Urine culture showed greater than 100,000 Enterococcus for which she was treated with five days of Cipro.  The organism was indeed sensitive to ciprofloxacin.    She also developed some degree of acute renal insufficiency with a creatinine that got as high as 2.26.  This was on 10/02/2014.  This gradually improved, and at discharge this was 1.53.  The report was that she had seen Nephrology in the past.  This was likely in the [place] area and we do not have access to this.      She was transfused 3 U of packed red cells.    PAST MEDICAL HISTORY/PROBLEM LIST:            Gastroesophageal reflux disease.    Carotid artery disease.    Essential hypertension.    Type 2 diabetes.  On oral agents.    Hyperlipidemia.    Sigmoid diverticulosis.    Cholelithiasis.    Anemia.     PAST SURGICAL HISTORY:           Left carotid endarterectomy in 2011.    Cataract extraction in June 2016 on the left and then a right cataract extraction in August 2016.      CURRENT MEDICATIONS:  Discharge medications include:      Cipro 250 b.i.d.     Lovenox 30 q.d. until 10/30/2014.     Hydrocodone/APAP 1 q.6 hours p.r.n.     Norvasc 10 q.d.    Lasix 40 q.d.      ASA 81 q.d.      Os-Cal 500/200, 1 tablet daily.      Ferrous sulfate 325 daily.    Folic acid 1 mg daily.     Amaryl 2 mg daily.      Acular 1 drop b.i.d.     Metoprolol XL 25 q.d.      Ocuflox 1 drop four times daily.      Prilosec 20 q.d.      Prednisone ophthalmic 1 drop three times daily.    Zocor 20 q.d.       *Noted that medications were stopped in the hospital, presumably because of renal insufficiency, to include:    Diclofenac.  Lisinopril.     Metformin.       SOCIAL HISTORY:                   HOUSING:  The patient tells me she lives in a senior's apartment in [place].   FUNCTIONAL STATUS:  Independent with ADLs and IADLs.  Does not use a walker.   She does not use an ambulatory assist device.   TOBACCO USE:  She is a former smoker, quitting in 2013.       FAMILY HISTORY:        MOTHER:  Diabetes.   FATHER:  Diabetes.   SIBLINGS:  Heart disease in sisters.    REVIEW OF SYSTEMS:        General: Has been well, no recent fever weight loss or gain.     HEENT:  Mouth is dry, but no oral pain.     CHEST/RESPIRATORY:  No cough.  No sputum.        CARDIAC:  No chest pain or palpitations.      GI:  She is complaining of constipation for the last 2-3 days.      GU:  She is complaining of urinary frequency during the day, every half an hour to an hour.      MUSCULOSKELETAL:  She does not complain of back pain, although there is p.r.n. lidocaine ordered for back pain from the hospital.   GAIT:  At home, she states she walked normally without an ambulatory assist device.   NEUROLOGICAL:  She does not complain of neuropathic symptoms in her legs.     PHYSICAL EXAMINATION:   GENERAL APPEARANCE:  The patient is a pleasant woman in no distress.     HEENT:    MOUTH/THROAT:    Upper denture plate.   CHEST/RESPIRATORY:  Clear air entry bilaterally.     CARDIOVASCULAR:   CARDIAC:  Heart sounds are normal.  There is a 2/6 systolic ejection murmur that does not radiate to carotids.  There is no elevation of her jugular venous pressure.     GASTROINTESTINAL:   ABDOMEN:  Somewhat distended.  No masses are noted.     LIVER/SPLEEN/KIDNEY:   No liver, no spleen.   GENITOURINARY:   BLADDER:  Not distended or tender.  There is no CVA tenderness.     CIRCULATION:   EDEMA/VARICOSITIES:  Extremities:  There is a fair amount of edema in the left leg.  I suspect this is blood loss (was transfused 3 U).  I doubt there is a DVT.   VASCULAR:   ARTERIAL:  Peripheral pulses are palpable.     MUSCULOSKELETAL:    BACK:  Significant thoracic kyphosis noted.     NEUROLOGICAL:   SENSATION/STRENGTH:  She has some degree of weakness in even her right leg proximally at 4/5.   DEEP TENDON REFLEXES:  Reflexes are maintained at both knee jerks.   PSYCHIATRIC:   MENTAL STATUS:    She is conversational.  No evidence of depression or delirium.      ASSESSMENT/PLAN:          Status post left hip fracture.  She underwent a left hip arthroplasty.   She is on Lovenox for DVT prophylaxis.    Anemia that required transfusion.  I suspect this is the reason for the edema in her left leg.   We will follow up on her hemoglobin.    Acute renal failure on probably some degree of chronic renal failure.  She left the hospital with a creatinine of 1.53, potassium at 3.7.  Noted that she had a creatinine of 2.05 on 08/15/2014 and an estimated GFR of 25.   Her creatinine was normal in 2011.    UTI with Enterococcus.   She is completing her Cipro.  I will check a bladder ultrasound on her and follow up on this.  She may need a repeat culture.    Type 2 diabetes with chronic renal failure.  Her metformin was discontinued and she is now on Amaryl.  This has a long half-life in some people.  I  will check a blood sugar on her and review this on Wednesday.    Recent cataract surgery.  This may have contributed to the fall.    Apparently normal DEXA scans in the past although, in looking at her degree of kyphosis, I would wonder about this.    History of carotid artery disease.  Status post endarterectomy.  She does not appear to have active macrovascular disease in her lower extremities.

## 2014-10-11 ENCOUNTER — Non-Acute Institutional Stay (SKILLED_NURSING_FACILITY): Payer: Medicare Other | Admitting: Internal Medicine

## 2014-10-11 ENCOUNTER — Encounter (HOSPITAL_COMMUNITY)
Admission: AD | Admit: 2014-10-11 | Discharge: 2014-10-11 | Disposition: A | Discharge status: Home / Self Care | Payer: Medicare Other | Source: Skilled Nursing Facility | Attending: Internal Medicine | Admitting: Internal Medicine

## 2014-10-11 DIAGNOSIS — N183 Chronic kidney disease, stage 3 unspecified: Secondary | ICD-10-CM

## 2014-10-11 DIAGNOSIS — E0822 Diabetes mellitus due to underlying condition with diabetic chronic kidney disease: Secondary | ICD-10-CM | POA: Diagnosis not present

## 2014-10-11 DIAGNOSIS — R609 Edema, unspecified: Secondary | ICD-10-CM

## 2014-10-11 LAB — BASIC METABOLIC PANEL
ANION GAP: 8 (ref 5–15)
BUN: 38 mg/dL — ABNORMAL HIGH (ref 6–20)
CO2: 28 mmol/L (ref 22–32)
Calcium: 8.7 mg/dL — ABNORMAL LOW (ref 8.9–10.3)
Chloride: 98 mmol/L — ABNORMAL LOW (ref 101–111)
Creatinine, Ser: 2.24 mg/dL — ABNORMAL HIGH (ref 0.44–1.00)
GFR, EST AFRICAN AMERICAN: 23 mL/min — AB (ref >60)
GFR, EST NON AFRICAN AMERICAN: 20 mL/min — AB (ref >60)
Glucose, Bld: 135 mg/dL — ABNORMAL HIGH (ref 65–99)
POTASSIUM: 4.4 mmol/L (ref 3.5–5.1)
SODIUM: 134 mmol/L — AB (ref 135–145)

## 2014-10-11 LAB — CBC
HEMATOCRIT: 30.6 % — AB (ref 36.0–46.0)
HEMOGLOBIN: 10 g/dL — AB (ref 12.0–15.0)
MCH: 29.1 pg (ref 26.0–34.0)
MCHC: 32.7 g/dL (ref 30.0–36.0)
MCV: 89 fL (ref 78.0–100.0)
Platelets: 340 10*3/uL (ref 150–400)
RBC: 3.44 MIL/uL — AB (ref 3.87–5.11)
RDW: 14.8 % (ref 11.5–15.5)
WBC: 8.3 10*3/uL (ref 4.0–10.5)

## 2014-10-12 ENCOUNTER — Ambulatory Visit (HOSPITAL_COMMUNITY)
Admission: RE | Admit: 2014-10-12 | Discharge: 2014-10-12 | Disposition: A | Discharge status: Home / Self Care | Payer: Medicare Other | Source: Ambulatory Visit | Attending: Internal Medicine | Admitting: Internal Medicine

## 2014-10-12 DIAGNOSIS — M7989 Other specified soft tissue disorders: Secondary | ICD-10-CM | POA: Diagnosis not present

## 2014-10-16 ENCOUNTER — Encounter (HOSPITAL_COMMUNITY)
Admission: RE | Admit: 2014-10-16 | Discharge: 2014-10-16 | Disposition: A | Discharge status: Home / Self Care | Payer: Medicare Other | Source: Skilled Nursing Facility | Attending: Internal Medicine | Admitting: Internal Medicine

## 2014-10-16 ENCOUNTER — Other Ambulatory Visit: Payer: Self-pay | Admitting: *Deleted

## 2014-10-16 LAB — BASIC METABOLIC PANEL
Anion gap: 7 (ref 5–15)
BUN: 33 mg/dL — ABNORMAL HIGH (ref 6–20)
CO2: 26 mmol/L (ref 22–32)
Calcium: 9.2 mg/dL (ref 8.9–10.3)
Chloride: 105 mmol/L (ref 101–111)
Creatinine, Ser: 2.13 mg/dL — ABNORMAL HIGH (ref 0.44–1.00)
GFR calc Af Amer: 24 mL/min — ABNORMAL LOW (ref >60)
GFR calc non Af Amer: 21 mL/min — ABNORMAL LOW (ref >60)
Glucose, Bld: 101 mg/dL — ABNORMAL HIGH (ref 65–99)
Potassium: 4.1 mmol/L (ref 3.5–5.1)
Sodium: 138 mmol/L (ref 135–145)

## 2014-10-16 MED ORDER — HYDROCODONE-ACETAMINOPHEN 5-325 MG PO TABS: 1.0000 | ORAL_TABLET | Freq: Four times a day (QID) | ORAL | Status: DC | PRN

## 2014-10-16 NOTE — Telephone Encounter (Signed)
[facility] 

## 2014-10-17 ENCOUNTER — Non-Acute Institutional Stay (SKILLED_NURSING_FACILITY): Payer: Medicare Other | Admitting: Internal Medicine

## 2014-10-17 DIAGNOSIS — D62 Acute posthemorrhagic anemia: Secondary | ICD-10-CM

## 2014-10-17 DIAGNOSIS — R059 Cough, unspecified: Secondary | ICD-10-CM | POA: Insufficient documentation

## 2014-10-17 DIAGNOSIS — R05 Cough: Secondary | ICD-10-CM | POA: Insufficient documentation

## 2014-10-17 DIAGNOSIS — N289 Disorder of kidney and ureter, unspecified: Secondary | ICD-10-CM | POA: Diagnosis not present

## 2014-10-17 NOTE — Progress Notes (Signed)
Patient ID: Marie Hunt, female   DOB: [birth year], 81 y.o.   MRN: [id number]         DATE:  10/17/2014          FACILITY: [facility]                         LEVEL OF CARE:   SNF   CHIEF COMPLAINT:  Acute visit follow-up cough-renal insufficiency       HISTORY OF PRESENT ILLNESS:  This is a 81 year-old woman who lives independently in [place].    She had had cataract surgery on her right eye, was sent home and fell and noted to have a left hip fracture.  She has no prior history of falls and states that her previous DEXA scans have been within the normal limits.    She was seen preoperatively by Cardiology.  An echo was completed.  There were no issues.  The echocardiogram showed mild aortic stenosis and mild aortic regurgitation.  Her left ventricular ejection fraction was 50-55%.     .    She also developed some degree of acute renal insufficiency with a creatinine that got as high as 2.26.  This was on 10/02/2014.  This gradually improved, and at discharge this was 1.53.  The report was that she had seen Nephrology in the past.  This was likely in the [place] area and we do not have access to this Updated creatinine on August 17 was 2.24.    --Updated lab done yesterday shows it is down to 2.13 with a BUN of 33 per chart review it appears her baseline creatinine appears to be more in the low 2 range it was 2.05 in June--  The overnight nurse did leave a note about some possible coughing congestion on following up on this according to her daytime nurse she has not noted this.; "Today she states it is much better she says she is coughing up some mucus and this is helping  she says she has this problem occasionally  She also did receive postoperative a blood transfusion secondary to anemia hemoglobin appears to be rising currently 10.0 on lab done last week  Her vital signs appear to be stable she is not complaining of any fever or chills she is afebrile      PAST MEDICAL  HISTORY/PROBLEM LIST:            Gastroesophageal reflux disease.    Carotid artery disease.    Essential hypertension.    Type 2 diabetes.  On oral agents.    Hyperlipidemia.    Sigmoid diverticulosis.    Cholelithiasis.    Anemia.     PAST SURGICAL HISTORY:           Left carotid endarterectomy in 2011.    Cataract extraction in June 2016 on the left and then a right cataract extraction in August 2016.    CURRENT MEDICATIONS:  Discharge medications include:      Cipro 250 b.i.d.     Lovenox 30 q.d. until 10/30/2014.     Hydrocodone/APAP 1 q.6 hours p.r.n.     Norvasc 10 q.d.    Lasix 40 q.dprn.      ASA 81 q.d.      Os-Cal 500/200, 1 tablet daily.      Ferrous sulfate 325 daily.    Folic acid 1 mg daily.     Amaryl 2 mg daily.  Acular 1 drop b.i.d.     Metoprolol XL 25 q.d.      Ocuflox 1 drop four times daily.      Prilosec 20 q.d.      Prednisone ophthalmic 1 drop three times daily.    Zocor 20 q.d.       *Noted that medications were stopped in the hospital, presumably because of renal insufficiency, to include:    Diclofenac.    Lisinopril.     Metformin.       SOCIAL HISTORY:                   HOUSING:  The patient e lives in a senior's apartment in [place].   FUNCTIONAL STATUS:  Independent with ADLs and IADLs.  Does not use a walker.   She does not use an ambulatory assist device.   TOBACCO USE:  She is a former smoker, quitting in 2013.       FAMILY HISTORY:        MOTHER:  Diabetes.   FATHER:  Diabetes.   SIBLINGS:  Heart disease in sisters.    REVIEW OF SYSTEMS:        General: Has been well, no recent fever      HEENT:  Mot no oral pain.     CHEST/RESPIRATORY: Cough as noted above she says this has gotten better today          CARDIAC:  No chest pain or palpitations.      GI:  Not complaining of abdominal pain or constipation today.           MUSCULOSKELETAL:  She does not complain of back pain, although there is p.r.n.  lidocaine ordered for back pain from the hospital.   GAIT:  At home, she states she walked normally without an ambulatory assist device.   NEUROLOGICAL:  She does not complain of neuropathic symptoms in her legs.     PHYSICAL EXAMINATION:   She is afebrile pulses 76 respirations 18 blood pressures variable 141/57-125/49 in this range recently weight is 144 this appears down about 6 pounds over the past week some of this may be scale variation GENERAL APPEARANCE:  The patient is a pleasant woman in no distress.     HEENT:   MOUTH/THROAT:    Upper denture plate. Oropharynx is clear mucous membranes moist   CHEST/RESPIRATORY:  Clear air entry bilaterally.     CARDIOVASCULAR:   CARDIAC:  Heart sounds are normal.  There is a 2/6 systolic ejection murmur that does not radiate to carotids.  There is no elevation of her jugular venous pressure. She has always say mild lower extremity edema bilaterally     GASTROINTESTINAL:   ABDOMEN:  Somewhat distended.  No masses are noted. Sounds are positive      .     MUSCULOSKELETAL:    BACK:  Significant thoracic kyphosis noted.     NEUROLOGICAL:   There are no Lateralizing findings her speech is clear appear she has some lower extremity weakness but this is not new or acute   PSYCHIATRIC:   MENTAL STATUS:    She is conversational.  No evidence of depression or delirium.    Labs.  10/16/2014.  Sodium 138 potassium 4.1 BUN 33 creatinine 2.13.  10/11/2014.  Sodium 134 potassium 4.4 BUN 38 creatinine 2.24.  WBC 8.3 hemoglobin 10.0 platelets 340.      ASSESSMENT/PLAN:    Cough-she says this has improved apparently there is some  production-will add Mucinex however 600 twice a day for 5 days to help with this-also monitor vital signs pulse ox of 48 hours to keep aneye on her status--chest exam was benign       Status post left hip fracture.  She underwent a left hip arthroplasty.   She is on Lovenox for DVT prophylaxis.    Anemia that required  transfusion.  I suspect this is the reason for the edema in her left leg.HGB1 appears to be improved  --will update this next week  Acute renal failure on probably some degree of chronic renal failure.  She left the hospital with a creatinine of 1.53, potassium at 3.7.  Noted that she had a creatinine of 2.05 on 08/15/2014 and an estimated GFR of 25.   Her creatinine was normal in 2011--creatinine is slightly improved on lab done yesterday will update this next week I suspect this may be closer to her baseline    [id number].

## 2014-10-18 ENCOUNTER — Encounter (HOSPITAL_COMMUNITY)
Admission: AD | Admit: 2014-10-18 | Discharge: 2014-10-18 | Disposition: A | Discharge status: Home / Self Care | Payer: Medicare Other | Source: Skilled Nursing Facility | Attending: Internal Medicine | Admitting: Internal Medicine

## 2014-10-18 LAB — BASIC METABOLIC PANEL
Anion gap: 10 (ref 5–15)
BUN: 27 mg/dL — ABNORMAL HIGH (ref 6–20)
CALCIUM: 9.7 mg/dL (ref 8.9–10.3)
CO2: 26 mmol/L (ref 22–32)
Chloride: 103 mmol/L (ref 101–111)
Creatinine, Ser: 2.18 mg/dL — ABNORMAL HIGH (ref 0.44–1.00)
GFR, EST AFRICAN AMERICAN: 23 mL/min — AB (ref >60)
GFR, EST NON AFRICAN AMERICAN: 20 mL/min — AB (ref >60)
Glucose, Bld: 95 mg/dL (ref 65–99)
Potassium: 4 mmol/L (ref 3.5–5.1)
Sodium: 139 mmol/L (ref 135–145)

## 2014-10-18 LAB — CBC WITH DIFFERENTIAL/PLATELET
BASOS ABS: 0.1 10*3/uL (ref 0.0–0.1)
BASOS PCT: 1 % (ref 0–1)
Eosinophils Absolute: 0.3 10*3/uL (ref 0.0–0.7)
Eosinophils Relative: 3 % (ref 0–5)
HEMATOCRIT: 32.7 % — AB (ref 36.0–46.0)
HEMOGLOBIN: 10.6 g/dL — AB (ref 12.0–15.0)
Lymphocytes Relative: 29 % (ref 12–46)
Lymphs Abs: 2.8 10*3/uL (ref 0.7–4.0)
MCH: 29.2 pg (ref 26.0–34.0)
MCHC: 32.4 g/dL (ref 30.0–36.0)
MCV: 90.1 fL (ref 78.0–100.0)
MONOS PCT: 6 % (ref 3–12)
Monocytes Absolute: 0.6 10*3/uL (ref 0.1–1.0)
NEUTROS ABS: 6 10*3/uL (ref 1.7–7.7)
NEUTROS PCT: 61 % (ref 43–77)
Platelets: 493 10*3/uL — ABNORMAL HIGH (ref 150–400)
RBC: 3.63 MIL/uL — ABNORMAL LOW (ref 3.87–5.11)
RDW: 14.2 % (ref 11.5–15.5)
WBC: 9.7 10*3/uL (ref 4.0–10.5)

## 2014-10-18 NOTE — Progress Notes (Addendum)
Patient ID: Marie Hunt, female   DOB: 01-Jul-1932, 81 y.o.   MRN: [id number]                PROGRESS NOTE  DATE:  10/11/2014           FACILITY: [facility]                     LEVEL OF CARE:   SNF   Acute Visit               CHIEF COMPLAINT:  Increasing BUN and creatinine.      HISTORY OF PRESENT ILLNESS:  This is a patient who was in hospital after suffering a left hip fracture that was surgically repaired.     She is a type 2 diabetic and probably has some degree of baseline renal insufficiency.  In June 2016, her creatinine was 2.05.  On admission to hospital, her BUN was 31 and creatinine 2.26.  The patient tells me she is on Lasix 40 mg a day at home.  This was probably put on hold and she was actually discharged with a BUN of 26 and creatinine of 1.53.  Her Lasix 40 mg was restarted.    Lab work from today shows a BUN of 38, creatinine of 2.24.  In spite of this, she has significant lower extremity edema, especially in the fractured left leg. She also has previously coccyx edema.  An echocardiogram in the hospital did not show significant evidence of biventricular dysfunction.    CURRENT MEDICATIONS:  Medication list is reviewed.    Past Medical History  Diagnosis Date  . GERD (gastroesophageal reflux disease)   . Carotid artery disease     123456 RICA, patent LICA [id number] - Dr. Scot Dock  . Essential hypertension, benign   . Type 2 diabetes mellitus   . Mixed hyperlipidemia   . Sigmoid diverticulosis   . Cholelithiasis   . Diabetes mellitus     Type II  . Cholelithiasis     In need of cholecystectomy  . Sigmoid diverticulosis   . GERD (gastroesophageal reflux disease)   . Anemia    Past Surgical History  Procedure Laterality Date  . Left carotid endarterectomy  7/11    Dr. Scot Dock  . Carotid endarterectomy  08/24/2009    Left catroid endarterectomy  . Cataract extraction w/phaco Left 08/17/2014    Procedure: CATARACT EXTRACTION PHACO AND INTRAOCULAR  LENS PLACEMENT; CDE:  7.39;  Surgeon: Tonny Branch, MD;  Location: [facility];  Service: Ophthalmology;  Laterality: Left;  Marland Kitchen Eye surgery    . Cataract extraction w/phaco Right 10/02/2014    Procedure: CATARACT EXTRACTION PHACO AND INTRAOCULAR LENS PLACEMENT RIGHT EYE CDE=5.83;  Surgeon: Tonny Branch, MD;  Location: [facility];  Service: Ophthalmology;  Laterality: Right;  . Hip arthroplasty Left 10/03/2014    Procedure: ARTHROPLASTY BIPOLAR HIP (HEMIARTHROPLASTY);  Surgeon: Sanjuana Kava, MD;  Location: [facility];  Service: Orthopedics;  Laterality: Left;    REVIEW OF SYSTEMS:    GENERAL:  Her weight has actually gone up two pounds since her arrival here, 148 to 150.   CHEST/RESPIRATORY:  No shortness of breath.   CARDIAC:  No chest pain or palpitations.   GI:  No nausea, vomiting, or diarrhea.         GU:  No dysuria.   EDEMA/VARICOSITIES:  Extremities:  The patient complains about increasing edema in the left leg, but also some edema in the right leg.  NEUROLOGICAL:  No complaints of weakness or numbness.    PHYSICAL EXAMINATION:   VITAL SIGNS:     PULSE:  82, and regular.   RESPIRATIONS:  18.    02 SATURATIONS:  92% on room air.    GENERAL APPEARANCE:  The patient is not in any distress.       HEENT:   MOUTH/THROAT:  She has a coated tongue.       CHEST/RESPIRATORY:  Shallow air entry bilaterally, which I think is due to a very significant kyphosis.  There is no wheezing.     CARDIOVASCULAR:   CARDIAC:  Heart sounds are normal.  There are no murmurs.   No gallops.  Her JVP is not elevated.   GASTROINTESTINAL:   LIVER/SPLEEN/KIDNEYS:  No liver, no spleen.  No tenderness.     CIRCULATION:   EDEMA/VARICOSITIES:  Extremities:  There is significant edema in the left leg, but no real tenderness.  There are lesser degrees in the right leg.  She also has 2-3+ pitting coccyx edema.   NEUROLOGIC: no focal weakness is noted  ASSESSMENT/PLAN:                     Lower extremity edema of the left leg.   There was a lot of blood loss at the time of surgery.  I suspect this is the issue, but I am going to go ahead and order a duplex ultrasound in any case.    Chronic renal failure.  It would appear that her most recent baseline creatinine in [facility] was 2.05 in June.  This is up to 2.24.  This is not a major increase.  I suspect she may have been off her diuretic in the hospital.   She is not on NSAIDs or ACE inhibitors.  I am going to continue the Lasix and repeat her basic metabolic panel next week.    Type 2 diabetes with chronic renal failure.  CBGs are very stable.  Her metformin was stopped in the hospital.  She is only on Amaryl.  Blood sugars in the 130 range.     CPT CODE: 03474

## 2014-10-25 ENCOUNTER — Non-Acute Institutional Stay (SKILLED_NURSING_FACILITY): Payer: Medicare Other | Admitting: Internal Medicine

## 2014-10-25 DIAGNOSIS — N183 Chronic kidney disease, stage 3 unspecified: Secondary | ICD-10-CM

## 2014-10-25 DIAGNOSIS — E0822 Diabetes mellitus due to underlying condition with diabetic chronic kidney disease: Secondary | ICD-10-CM

## 2014-10-25 DIAGNOSIS — S72142D Displaced intertrochanteric fracture of left femur, subsequent encounter for closed fracture with routine healing: Secondary | ICD-10-CM | POA: Diagnosis not present

## 2014-10-31 ENCOUNTER — Encounter: Payer: Self-pay | Admitting: Family

## 2014-10-31 DIAGNOSIS — I10 Essential (primary) hypertension: Secondary | ICD-10-CM | POA: Diagnosis not present

## 2014-10-31 DIAGNOSIS — S72002D Fracture of unspecified part of neck of left femur, subsequent encounter for closed fracture with routine healing: Secondary | ICD-10-CM | POA: Diagnosis not present

## 2014-10-31 DIAGNOSIS — I2510 Atherosclerotic heart disease of native coronary artery without angina pectoris: Secondary | ICD-10-CM | POA: Diagnosis not present

## 2014-10-31 DIAGNOSIS — E119 Type 2 diabetes mellitus without complications: Secondary | ICD-10-CM | POA: Diagnosis not present

## 2014-10-31 NOTE — Progress Notes (Signed)
Patient ID: Marie Hunt, female   DOB: 1932/04/30, 81 y.o.   MRN: [id number]                PROGRESS NOTE  DATE:  10/25/2014       FACILITY: [facility]  LEVEL OF CARE:   SNF   Acute Visit/Discharge Visit   CHIEF COMPLAINT:  Pre-discharge review.      HISTORY OF PRESENT ILLNESS:  This is an 81 year-old woman who came to [place] earlier this month after suffering a left hip fracture that was surgically repaired.    She is going home perhaps somewhat early because of insurance co-pay issues.    The patient has medical issues including type 2 diabetes with chronic renal failure with a creatinine of over 2.  I restarted her Lasix 40 mg shortly after she came here due to disfiguring edema and she appears to have tolerated this fairly well.    CURRENT MEDICATIONS:  Medication list is reviewed.    LABORATORY DATA:   Laboratory work from 10/18/2014:    Sodium 139, potassium 4, BUN 27, creatinine 2.18.  This is stable for her.    White count 9.7, hemoglobin 10.6, platelet count 493.  Differential count is normal.     REVIEW OF SYSTEMS:    CHEST/RESPIRATORY:  The patient is not complaining of shortness of breath.   CARDIAC:  No chest pain.   GI:  No nausea, vomiting, or abdominal pain.       MUSCULOSKELETAL:  She is toe-touch weightbearing, which she cannot maintain with a walker.    PHYSICAL EXAMINATION:   VITAL SIGNS:     PULSE:  71.    RESPIRATIONS:  18, and unlabored.    BLOOD PRESSURE:  93% on room air.     GENERAL APPEARANCE:  The patient looks stable and well.    CHEST/RESPIRATORY:  Shallow, but otherwise clear air entry, which I think is largely secondary to a significant kyphosis.     CARDIOVASCULAR:   CARDIAC:  Heart sounds are normal.  2/6 systolic ejection murmur.  Her JVP is not elevated.       EDEMA/VARICOSITIES:  She has no coccyx and no peripheral edema.    GASTROINTESTINAL:   LIVER/SPLEEN/KIDNEYS:  No liver, no spleen.  No tenderness.       NEUROLOGICAL:    SENSATION/STRENGTH:  No focal weakness is noted.   BALANCE/GAIT:  I did not attempt to ambulate her.    ASSESSMENT/PLAN:                  Status post left hip fracture.    She is going home somewhat early due to insurance issues.  This is not unusual.   She will need a standard wheelchair.  She will need a walker at home ultimately, although Medicare will not pay for both of these.  She will go home with PT, OT, and skilled nursing, standard wheelchair with fold-away legs.   I have written a script for this.    Type 2 diabetes with chronic renal failure.  CBGs were stable.  Her metformin was stopped in the hospital.  She is only on Amaryl.        CPT CODE: 96295

## 2014-11-01 ENCOUNTER — Ambulatory Visit (INDEPENDENT_AMBULATORY_CARE_PROVIDER_SITE_OTHER): Payer: Medicare Other | Admitting: Family

## 2014-11-01 ENCOUNTER — Encounter: Payer: Self-pay | Admitting: Family

## 2014-11-01 ENCOUNTER — Ambulatory Visit (HOSPITAL_COMMUNITY)
Admission: RE | Admit: 2014-11-01 | Discharge: 2014-11-01 | Disposition: A | Discharge status: Home / Self Care | Payer: Medicare Other | Source: Ambulatory Visit | Attending: Family | Admitting: Family

## 2014-11-01 VITALS — BP 160/54 | HR 66 | Ht 63.0 in | Wt 141.0 lb

## 2014-11-01 DIAGNOSIS — I6523 Occlusion and stenosis of bilateral carotid arteries: Secondary | ICD-10-CM | POA: Diagnosis not present

## 2014-11-01 DIAGNOSIS — Z48812 Encounter for surgical aftercare following surgery on the circulatory system: Secondary | ICD-10-CM

## 2014-11-01 DIAGNOSIS — Z9889 Other specified postprocedural states: Secondary | ICD-10-CM | POA: Diagnosis not present

## 2014-11-01 DIAGNOSIS — E119 Type 2 diabetes mellitus without complications: Secondary | ICD-10-CM | POA: Insufficient documentation

## 2014-11-01 DIAGNOSIS — Z87891 Personal history of nicotine dependence: Secondary | ICD-10-CM | POA: Diagnosis not present

## 2014-11-01 DIAGNOSIS — E785 Hyperlipidemia, unspecified: Secondary | ICD-10-CM | POA: Diagnosis not present

## 2014-11-01 DIAGNOSIS — I10 Essential (primary) hypertension: Secondary | ICD-10-CM | POA: Insufficient documentation

## 2014-11-01 NOTE — Patient Instructions (Signed)
Stroke Prevention Some medical conditions and behaviors are associated with an increased chance of having a stroke. You may prevent a stroke by making healthy choices and managing medical conditions. HOW CAN I REDUCE MY RISK OF HAVING A STROKE?   Stay physically active. Get at least 30 minutes of activity on most or all days.  Do not smoke. It may also be helpful to avoid exposure to secondhand smoke.  Limit alcohol use. Moderate alcohol use is considered to be:  No more than 2 drinks per day for men.  No more than 1 drink per day for nonpregnant women.  Eat healthy foods. This involves:  Eating 5 or more servings of fruits and vegetables a day.  Making dietary changes that address high blood pressure (hypertension), high cholesterol, diabetes, or obesity.  Manage your cholesterol levels.  Making food choices that are high in fiber and low in saturated fat, trans fat, and cholesterol may control cholesterol levels.  Take any prescribed medicines to control cholesterol as directed by your health care provider.  Manage your diabetes.  Controlling your carbohydrate and sugar intake is recommended to manage diabetes.  Take any prescribed medicines to control diabetes as directed by your health care provider.  Control your hypertension.  Making food choices that are low in salt (sodium), saturated fat, trans fat, and cholesterol is recommended to manage hypertension.  Take any prescribed medicines to control hypertension as directed by your health care provider.  Maintain a healthy weight.  Reducing calorie intake and making food choices that are low in sodium, saturated fat, trans fat, and cholesterol are recommended to manage weight.  Stop drug abuse.  Avoid taking birth control pills.  Talk to your health care provider about the risks of taking birth control pills if you are over 35 years old, smoke, get migraines, or have ever had a blood clot.  Get evaluated for sleep  disorders (sleep apnea).  Talk to your health care provider about getting a sleep evaluation if you snore a lot or have excessive sleepiness.  Take medicines only as directed by your health care provider.  For some people, aspirin or blood thinners (anticoagulants) are helpful in reducing the risk of forming abnormal blood clots that can lead to stroke. If you have the irregular heart rhythm of atrial fibrillation, you should be on a blood thinner unless there is a good reason you cannot take them.  Understand all your medicine instructions.  Make sure that other conditions (such as anemia or atherosclerosis) are addressed. SEEK IMMEDIATE MEDICAL CARE IF:   You have sudden weakness or numbness of the face, arm, or leg, especially on one side of the body.  Your face or eyelid droops to one side.  You have sudden confusion.  You have trouble speaking (aphasia) or understanding.  You have sudden trouble seeing in one or both eyes.  You have sudden trouble walking.  You have dizziness.  You have a loss of balance or coordination.  You have a sudden, severe headache with no known cause.  You have new chest pain or an irregular heartbeat. Any of these symptoms may represent a serious problem that is an emergency. Do not wait to see if the symptoms will go away. Get medical help at once. Call your local emergency services (911 in [place] Do not drive yourself to the hospital. Document Released: 03/20/2004 Document Revised: 06/27/2013 Document Reviewed: 08/13/2012 [employer] Patient Information 2015 [employer]. This information is not intended to replace advice given   to you by your health care provider. Make sure you discuss any questions you have with your health care provider.  

## 2014-11-01 NOTE — Progress Notes (Signed)
Established Carotid Patient   History of Present Illness  Marie Hunt is a 81 y.o. female  patient of Dr. Scot Dock who is status post left CEA in July 2011. She returns today for follow up. Patient denies any history of stroke or TIA, specifically she denies any history of amaurosis fugax or monocular blindness, facial drooping, hemiplegia, or receptive or expressive aphasia. Pt. denies extremity weakness.  Pt states her PCP is aware of her irregular heart beat. She states she has carpal tunnel syndrome in her left wrist with tingling.  Pt states she did not take her blood pressure medications this AM which include amlodipine, lisinopril, and Lasix, states she will take them as soon as she gets home. Pt states she is scheduled to see her PCP tomorrow.   Pt reports New Medical or Surgical History: Left hip arthroplasty August 2016 after falling and fracturing her left hip, states she has osteoporosis, bilateral cataract extractions with IOL in June and August 2016. She was discharged from the rehab center on 10/31/14 and is receiving home physical therapy three times/week.  She was hospitalized in July, 2015, for acute kidney failure, she had a UTI. Pt states she is seeing a nephrologist but is much improved.  She also was transfused for severe anemia, is taking iron tabs, denies GI bleeding. She has no claudication symptoms, denies non healing wounds.  Pt Diabetic: Yes, states in control, does not recall what her last A1C was, states her FBS is usually 120  Pt smoker: former smoker, quit in 2013  Pt meds include: Statin : Yes ASA: Yes Other anticoagulants/antiplatelets: no   Past Medical History  Diagnosis Date  . GERD (gastroesophageal reflux disease)   . Carotid artery disease     123456 RICA, patent LICA [id number] - Dr. Scot Dock  . Essential hypertension, benign   . Type 2 diabetes mellitus   . Mixed hyperlipidemia   . Sigmoid diverticulosis   . Cholelithiasis   . Diabetes  mellitus     Type II  . Cholelithiasis     In need of cholecystectomy  . Sigmoid diverticulosis   . GERD (gastroesophageal reflux disease)   . Anemia     Social History Social History  Substance Use Topics  . Smoking status: Former Smoker -- 0.30 packs/day for 60 years    Types: Cigarettes    Quit date: 10/27/2011  . Smokeless tobacco: Never Used  . Alcohol Use: No    Family History Family History  Problem Relation Age of Onset  . Coronary artery disease    . Heart disease Sister   . Heart disease Sister   . Heart disease Sister   . Diabetes Mother   . Diabetes Father   . Heart attack Daughter   . Other Daughter     Bleeding problems    Surgical History Past Surgical History  Procedure Laterality Date  . Left carotid endarterectomy  7/11    Dr. Scot Dock  . Carotid endarterectomy  08/24/2009    Left catroid endarterectomy  . Cataract extraction w/phaco Left 08/17/2014    Procedure: CATARACT EXTRACTION PHACO AND INTRAOCULAR LENS PLACEMENT; CDE:  7.39;  Surgeon: Tonny Branch, MD;  Location: [facility];  Service: Ophthalmology;  Laterality: Left;  Marland Kitchen Eye surgery    . Cataract extraction w/phaco Right 10/02/2014    Procedure: CATARACT EXTRACTION PHACO AND INTRAOCULAR LENS PLACEMENT RIGHT EYE CDE=5.83;  Surgeon: Tonny Branch, MD;  Location: [facility];  Service: Ophthalmology;  Laterality: Right;  . Hip  arthroplasty Left 10/03/2014    Procedure: ARTHROPLASTY BIPOLAR HIP (HEMIARTHROPLASTY);  Surgeon: Sanjuana Kava, MD;  Location: [facility];  Service: Orthopedics;  Laterality: Left;    Allergies  Allergen Reactions  . Codeine Nausea And Vomiting    Current Outpatient Prescriptions  Medication Sig Dispense Refill  . HYDROcodone-acetaminophen (NORCO/VICODIN) 5-325 MG per tablet Take 1-2 tablets by mouth every 6 (six) hours as needed for moderate pain. Do not exceed 3gm of APAP/24 hours from all sources 240 tablet 0   No current facility-administered medications for this visit.    Review  of Systems : See HPI for pertinent positives and negatives.  Physical Examination  Filed Vitals:   11/01/14 1025 11/01/14 1029  BP: 160/57 160/54  Pulse: 66   Height: 5\' 3"  (1.6 m)   Weight: 141 lb (63.957 kg)   SpO2: 100%    Body mass index is 24.98 kg/(m^2).   General: WDWN female in NAD GAIT: normal Eyes: PERRLA Pulmonary: Non-labored, CTAB, no rales, no rhonchi, & no wheezing.  Cardiac: regular rhythm with frequent premature contractions, no detected murmur.  VASCULAR EXAM Carotid Bruits Right Left   Negative positive   Aorta is not palpable. Radial pulses: 1+ right, 2+ left. Both brachial pulses are 3+ palpable.     Gastrointestinal: soft, nontender, BS WNL, no r/g,no palpable masses.  Musculoskeletal: Negative muscle atrophy/wasting. M/S 4/5 in upper extremities, 3/5 in lower extremities, extremities without ischemic changes.  Neurologic: A&O X 3; Appropriate Affect, Speech is normal CN 2-12 intact, Pain and light touch intact in extremities, Motor exam as listed above.         Non-Invasive Vascular Imaging CAROTID DUPLEX 11/01/2014   CEREBROVASCULAR DUPLEX EVALUATION    INDICATION: Carotid artery disease     PREVIOUS INTERVENTION(S): Left carotid endarterectomy 08/24/2009    DUPLEX EXAM:     RIGHT  LEFT  Peak Systolic Velocities (cm/s) End Diastolic Velocities (cm/s) Plaque LOCATION Peak Systolic Velocities (cm/s) End Diastolic Velocities (cm/s) Plaque  96 10  CCA PROXIMAL 137 14   73 7  CCA MID 90 11   69 11  CCA DISTAL 100 13   126 0 CP ECA 124 0   102 13 CP ICA PROXIMAL 37 9   95 17  ICA MID 77 18   92 16  ICA DISTAL 102 19     1.39 ICA / CCA Ratio (PSV) NA  Antegrade  Vertebral Flow Antegrade   123XX123 Brachial Systolic Pressure (mmHg) 123XX123  Multiphasic (Subclavian artery) Brachial Artery Waveforms Multiphasic (Subclavian  artery)    Plaque Morphology:  HM = Homogeneous, HT = Heterogeneous, CP = Calcific Plaque, SP = Smooth Plaque, IP = Irregular Plaque  ADDITIONAL FINDINGS:     IMPRESSION: Right internal carotid artery velocities suggest a <40% stenosis.  Patent left carotid endarterectomy site with no evidence of hyperplasia or restenosis.     Compared to the previous exam:  No significant change in comparison to the last exam on 10/26/2013.      Assessment: Marie Hunt is a 81 y.o. female who is status post left CEA in July 2011.  She has no history of stroke or TIA. Today's carotid Duplex suggests <40% right ICA stenosis and a patent left carotid endarterectomy site with no evidence of hyperplasia or restenosis.  No significant change in comparison to the last exam on 10/26/2013.   Plan: Follow-up in 1 year with Carotid Duplex.   I discussed in depth with the patient the nature of  atherosclerosis, and emphasized the importance of maximal medical management including strict control of blood pressure, blood glucose, and lipid levels, obtaining regular exercise, and continued cessation of smoking.  The patient is aware that without maximal medical management the underlying atherosclerotic disease process will progress, limiting the benefit of any interventions. The patient was given information about stroke prevention and what symptoms should prompt the patient to seek immediate medical care. Thank you for allowing [place] to participate in this patient's care.  Clemon Chambers, RN, MSN, FNP-C [facility] Office: [phone]  Clinic Physician: Scot Dock  11/01/2014 10:20 AM

## 2014-11-02 ENCOUNTER — Encounter (HOSPITAL_COMMUNITY): Payer: Self-pay | Admitting: Orthopaedic Surgery

## 2015-11-01 ENCOUNTER — Encounter: Payer: Self-pay | Admitting: Family

## 2015-11-06 ENCOUNTER — Other Ambulatory Visit: Payer: Self-pay | Admitting: *Deleted

## 2015-11-06 DIAGNOSIS — I6523 Occlusion and stenosis of bilateral carotid arteries: Secondary | ICD-10-CM

## 2015-11-07 ENCOUNTER — Ambulatory Visit (INDEPENDENT_AMBULATORY_CARE_PROVIDER_SITE_OTHER): Payer: Medicare Other | Admitting: Family

## 2015-11-07 ENCOUNTER — Ambulatory Visit (HOSPITAL_COMMUNITY)
Admission: RE | Admit: 2015-11-07 | Discharge: 2015-11-07 | Disposition: A | Discharge status: Home / Self Care | Payer: Medicare Other | Source: Ambulatory Visit | Attending: Family | Admitting: Family

## 2015-11-07 ENCOUNTER — Encounter: Payer: Self-pay | Admitting: Family

## 2015-11-07 VITALS — BP 150/65 | HR 58 | Ht 63.0 in | Wt 159.0 lb

## 2015-11-07 DIAGNOSIS — Z9889 Other specified postprocedural states: Secondary | ICD-10-CM | POA: Diagnosis not present

## 2015-11-07 DIAGNOSIS — I6522 Occlusion and stenosis of left carotid artery: Secondary | ICD-10-CM

## 2015-11-07 DIAGNOSIS — Z87891 Personal history of nicotine dependence: Secondary | ICD-10-CM

## 2015-11-07 DIAGNOSIS — I6523 Occlusion and stenosis of bilateral carotid arteries: Secondary | ICD-10-CM

## 2015-11-07 NOTE — Progress Notes (Signed)
Chief Complaint: Follow up Extracranial Carotid Artery Stenosis   History of Present Illness  Marie Hunt is a 82 y.o. female  patient of Dr. Scot Dock who is status post left CEA in July 2011. She returns today for follow up. Patient denies any history of stroke or TIA, specifically she denies any history of amaurosis fugax or monocular blindness, facial drooping, hemiplegia, or receptive or expressive aphasia. Pt. denies extremity weakness.  Pt states her PCP is aware of her occasional irregular heart beat. She states she has carpal tunnel syndrome in her left wrist with tingling.  Pt reports New Medical or Surgical History: Left hip arthroplasty August 2016 after falling and fracturing her left hip, states she has osteoporosis, bilateral cataract extractions with IOL in June and August 2016. She was discharged from the rehab center on 10/31/14 and is receiving home physical therapy three times/week.  She was hospitalized in July, 2015, for acute kidney failure, she had a UTI. Pt states she is seeing a nephrologist but is much improved.  She also was transfused for severe anemia, is taking iron tabs, denies GI bleeding. She has no claudication symptoms, denies non healing wounds.  Pt Diabetic: Yes, states in control, does not recall what her last A1C was, states her FBS is usually in the 80's Pt smoker: former smoker, quit in 2013  Pt meds include: Statin : Yes ASA: Yes Other anticoagulants/antiplatelets: no   Past Medical History:  Diagnosis Date  . Anemia   . Carotid artery disease (HCC)    9-83% RICA, patent LICA 3/82 - Dr. Scot Dock  . Cholelithiasis   . Cholelithiasis    In need of cholecystectomy  . Diabetes mellitus    Type II  . Essential hypertension, benign   . GERD (gastroesophageal reflux disease)   . GERD (gastroesophageal reflux disease)   . Mixed hyperlipidemia   . Sigmoid diverticulosis   . Sigmoid diverticulosis   . Type 2 diabetes mellitus  ([place])     Social History Social History  Substance Use Topics  . Smoking status: Former Smoker    Packs/day: 0.30    Years: 60.00    Types: Cigarettes    Quit date: 10/27/2011  . Smokeless tobacco: Never Used  . Alcohol use No    Family History Family History  Problem Relation Age of Onset  . Diabetes Mother   . Diabetes Father   . Heart attack Daughter   . Other Daughter     Bleeding problems  . Cancer Daughter   . Coronary artery disease    . Heart disease Sister   . Diabetes Sister   . Hyperlipidemia Sister   . Hypertension Sister   . Heart disease Sister   . Heart disease Sister     Surgical History Past Surgical History:  Procedure Laterality Date  . CAROTID ENDARTERECTOMY  08/24/2009   Left catroid endarterectomy  . CATARACT EXTRACTION W/PHACO Left 08/17/2014   Procedure: CATARACT EXTRACTION PHACO AND INTRAOCULAR LENS PLACEMENT; CDE:  7.39;  Surgeon: Tonny Branch, MD;  Location: [facility];  Service: Ophthalmology;  Laterality: Left;  . CATARACT EXTRACTION W/PHACO Right 10/02/2014   Procedure: CATARACT EXTRACTION PHACO AND INTRAOCULAR LENS PLACEMENT RIGHT EYE CDE=5.83;  Surgeon: Tonny Branch, MD;  Location: [facility];  Service: Ophthalmology;  Laterality: Right;  . EYE SURGERY    . HIP ARTHROPLASTY Left 10/03/2014   Procedure: ARTHROPLASTY BIPOLAR HIP (HEMIARTHROPLASTY);  Surgeon: Sanjuana Kava, MD;  Location: [facility];  Service: Orthopedics;  Laterality: Left;  .  Left carotid endarterectomy  7/11   Dr. Scot Dock    Allergies  Allergen Reactions  . Codeine Nausea And Vomiting    Current Outpatient Prescriptions  Medication Sig Dispense Refill  . amLODipine (NORVASC) 10 MG tablet Take 1 tablet (10 mg total) by mouth daily.    Marland Kitchen aspirin 81 MG tablet Take 81 mg by mouth daily.      . B Complex Vitamins (B-COMPLEX/B-12 PO) Take 1,000 mg by mouth daily.    . Calcium-Vitamin D 600-200 MG-UNIT per tablet Take 1 tablet by mouth daily.    . ferrous sulfate 325 (65 FE) MG tablet  Take 325 mg by mouth daily with breakfast.      . folic acid (FOLVITE) 1 MG tablet Take 1 mg by mouth daily.    . furosemide (LASIX) 40 MG tablet Take 1 tablet (40 mg total) by mouth daily as needed for fluid. 30 tablet   . glimepiride (AMARYL) 2 MG tablet Take 2 mg by mouth daily before breakfast.      . ketorolac (ACULAR) 0.5 % ophthalmic solution Apply 1 drop to eye 2 (two) times daily. To start 3 days prior to operation (10/02/14-scheduled date for operation), then continue as directed    . lidocaine (XYLOCAINE) 5 % ointment Apply 1 application topically 3 (three) times daily as needed. For back pain    . metoprolol succinate (TOPROL-XL) 25 MG 24 hr tablet Take 25 mg by mouth daily.      Marland Kitchen ofloxacin (OCUFLOX) 0.3 % ophthalmic solution Apply 1 drop to eye 4 (four) times daily. *to start 3 days prior to operation (10/02/14-scheduled operation) and to continue for 1 week after the surgery    . omeprazole (PRILOSEC) 20 MG capsule Take 20 mg by mouth 2 (two) times daily.     . prednisoLONE acetate (PRED FORTE) 1 % ophthalmic suspension Apply 1 drop to eye 3 (three) times daily. *To start following surgical procedure (10/02/14--scheduled surgery date) and continue    . simvastatin (ZOCOR) 20 MG tablet Take 20 mg by mouth at bedtime.      . calcium-vitamin D (OSCAL WITH D) 500-200 MG-UNIT per tablet Take 1 tablet by mouth daily.      Marland Kitchen enoxaparin (LOVENOX) 30 MG/0.3ML injection Inject 0.3 mLs (30 mg total) into the skin daily. Until 9/5 (Patient not taking: Reported on 11/07/2015) 0 Syringe   . HYDROcodone-acetaminophen (NORCO/VICODIN) 5-325 MG per tablet Take 1-2 tablets by mouth every 6 (six) hours as needed for moderate pain. Do not exceed 3gm of APAP/24 hours from all sources (Patient not taking: Reported on 11/07/2015) 240 tablet 0   No current facility-administered medications for this visit.     Review of Systems : See HPI for pertinent positives and negatives.  Physical Examination  Vitals:    11/07/15 0952 11/07/15 0953  BP: (!) 154/62 (!) 150/65  Pulse: (!) 58   SpO2: 99%   Weight: 159 lb (72.1 kg)   Height: 5\' 3"  (1.6 m)    Body mass index is 28.17 kg/m.  General: WDWN female in NAD GAIT: slow and deliberate, using cane Eyes: PERRLA Pulmonary: Respirations are non-labored, CTAB, no rales, no rhonchi, & no wheezing.  Cardiac: regular rhythm and rate+ murmur.  VASCULAR EXAM Carotid Bruits Right Left   tranasmitted cardiac murmur transmitted cardiac murmur   Aorta is not palpable. Radial pulses: 2+ bilateral. Both brachial pulses are 3+ palpable.Both DP pulses are palpable     Gastrointestinal: soft, nontender, BS WNL, no  r/g,no palpable masses.  Musculoskeletal: No muscle atrophy/wasting. M/S 4/5 throughout, extremities without ischemic changes.  Neurologic: A&O X 3; Appropriate Affect, Speech is normal CN 2-12 intact, Pain and light touch intact in extremities, Motor exam as listed above.      Assessment: Marie Hunt is a 82 y.o. female  who is status post left CEA in July 2011.  She has no history of stroke or TIA. Today's carotid Duplex suggests <40% right ICA stenosis and a patent left carotid endarterectomy site with no evidence of hyperplasia or restenosis.  No significant change in comparison to the last exam on 10/26/2013 and 11/01/14.   Plan: Follow-up in 1 year with Carotid Duplex scan.   I discussed in depth with the patient the nature of atherosclerosis, and emphasized the importance of maximal medical management including strict control of blood pressure, blood glucose, and lipid levels, obtaining regular exercise, and continued cessation of smoking.  The patient is aware that without maximal medical management the underlying atherosclerotic disease process will progress, limiting the benefit of any  interventions. The patient was given information about stroke prevention and what symptoms should prompt the patient to seek immediate medical care. Thank you for allowing [place] to participate in this patient's care.  Clemon Chambers, RN, MSN, FNP-C [facility] Office: [phone]  Clinic Physician: Scot Dock  11/07/15 10:02 AM

## 2015-11-07 NOTE — Patient Instructions (Signed)
Stroke Prevention Some medical conditions and behaviors are associated with an increased chance of having a stroke. You may prevent a stroke by making healthy choices and managing medical conditions. HOW CAN I REDUCE MY RISK OF HAVING A STROKE?   Stay physically active. Get at least 30 minutes of activity on most or all days.  Do not smoke. It may also be helpful to avoid exposure to secondhand smoke.  Limit alcohol use. Moderate alcohol use is considered to be:  No more than 2 drinks per day for men.  No more than 1 drink per day for nonpregnant women.  Eat healthy foods. This involves:  Eating 5 or more servings of fruits and vegetables a day.  Making dietary changes that address high blood pressure (hypertension), high cholesterol, diabetes, or obesity.  Manage your cholesterol levels.  Making food choices that are high in fiber and low in saturated fat, trans fat, and cholesterol may control cholesterol levels.  Take any prescribed medicines to control cholesterol as directed by your health care provider.  Manage your diabetes.  Controlling your carbohydrate and sugar intake is recommended to manage diabetes.  Take any prescribed medicines to control diabetes as directed by your health care provider.  Control your hypertension.  Making food choices that are low in salt (sodium), saturated fat, trans fat, and cholesterol is recommended to manage hypertension.  Ask your health care provider if you need treatment to lower your blood pressure. Take any prescribed medicines to control hypertension as directed by your health care provider.  If you are 18-39 years of age, have your blood pressure checked every 3-5 years. If you are 40 years of age or older, have your blood pressure checked every year.  Maintain a healthy weight.  Reducing calorie intake and making food choices that are low in sodium, saturated fat, trans fat, and cholesterol are recommended to manage  weight.  Stop drug abuse.  Avoid taking birth control pills.  Talk to your health care provider about the risks of taking birth control pills if you are over 35 years old, smoke, get migraines, or have ever had a blood clot.  Get evaluated for sleep disorders (sleep apnea).  Talk to your health care provider about getting a sleep evaluation if you snore a lot or have excessive sleepiness.  Take medicines only as directed by your health care provider.  For some people, aspirin or blood thinners (anticoagulants) are helpful in reducing the risk of forming abnormal blood clots that can lead to stroke. If you have the irregular heart rhythm of atrial fibrillation, you should be on a blood thinner unless there is a good reason you cannot take them.  Understand all your medicine instructions.  Make sure that other conditions (such as anemia or atherosclerosis) are addressed. SEEK IMMEDIATE MEDICAL CARE IF:   You have sudden weakness or numbness of the face, arm, or leg, especially on one side of the body.  Your face or eyelid droops to one side.  You have sudden confusion.  You have trouble speaking (aphasia) or understanding.  You have sudden trouble seeing in one or both eyes.  You have sudden trouble walking.  You have dizziness.  You have a loss of balance or coordination.  You have a sudden, severe headache with no known cause.  You have new chest pain or an irregular heartbeat. Any of these symptoms may represent a serious problem that is an emergency. Do not wait to see if the symptoms will   go away. Get medical help at once. Call your local emergency services (911 in [place] Do not drive yourself to the hospital.   This information is not intended to replace advice given to you by your health care provider. Make sure you discuss any questions you have with your health care provider.   Document Released: 03/20/2004 Document Revised: 03/03/2014 Document Reviewed:  08/13/2012 Elsevier Interactive Patient Education 2016 [employer]  

## 2016-01-25 IMAGING — US US EXTREM LOW VENOUS*L*
1 series · 14 of 24 positions shown · non-contrast
Comparison: None.

CLINICAL DATA: Left leg swelling for 1 day.  Status post fall.

EXAM:
LEFT LOWER EXTREMITY VENOUS DUPLEX ULTRASOUND
TECHNIQUE: Doppler venous assessment of the left lower extremity deep venous
system was performed, including characterization of spectral flow,
compressibility, and phasicity.

[Series 1: us extrem low venous*left* · 0.05mm/px · 14 of 34 slices shown]
[im 1/34]
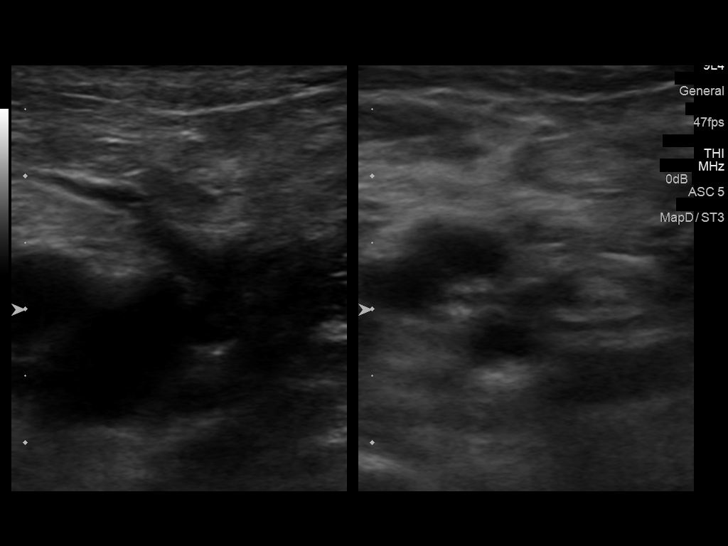
[im 3/34]
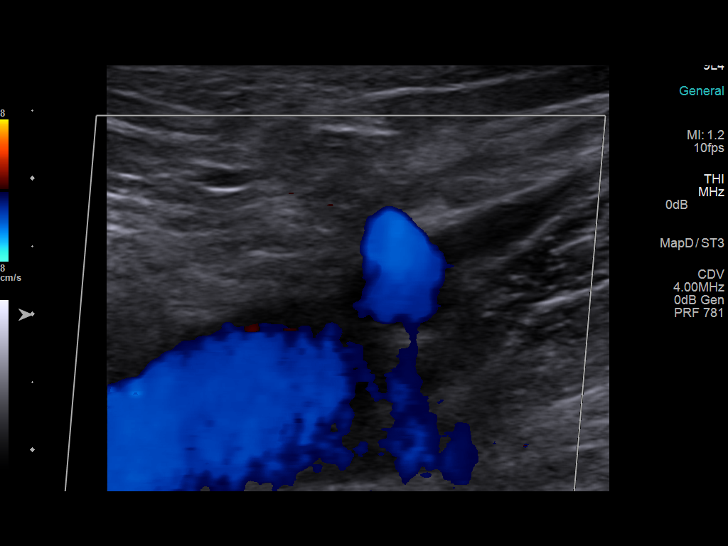
[im 6/34]
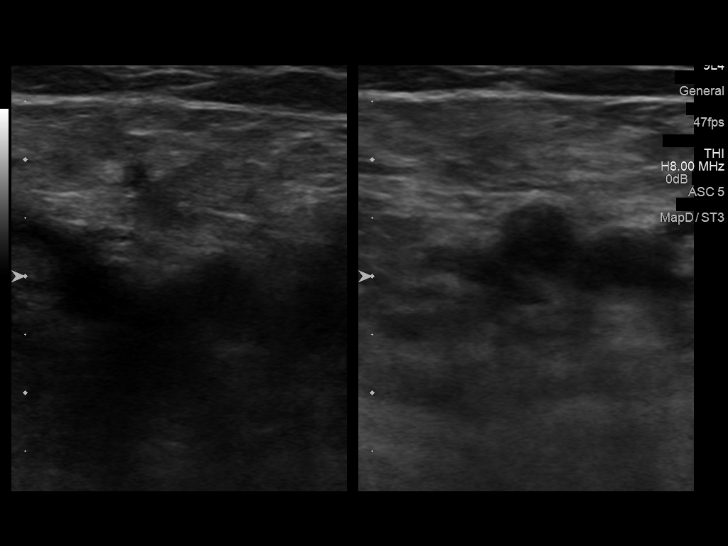
[im 9/34]
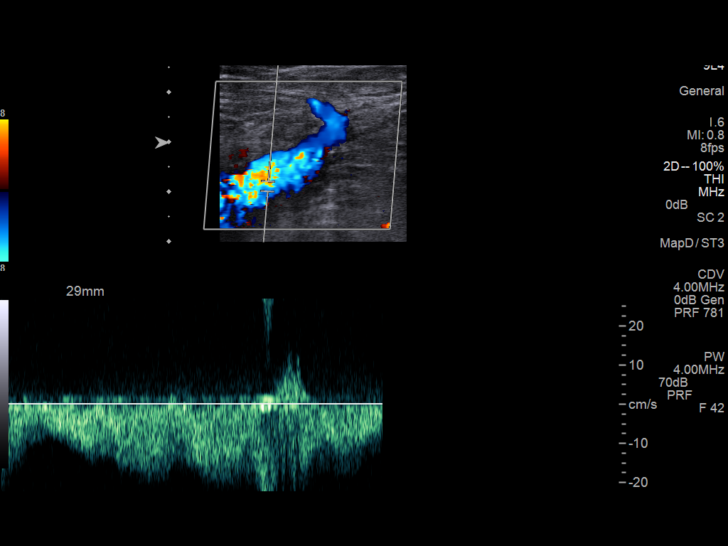
[im 11/34]
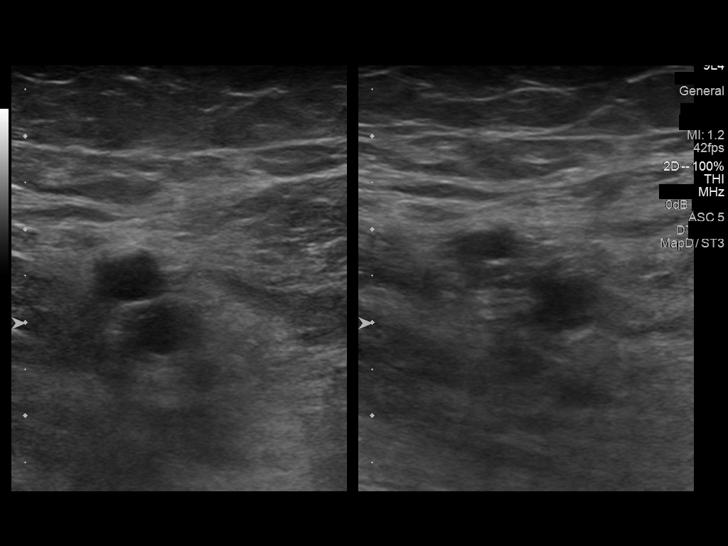
[im 13/34]
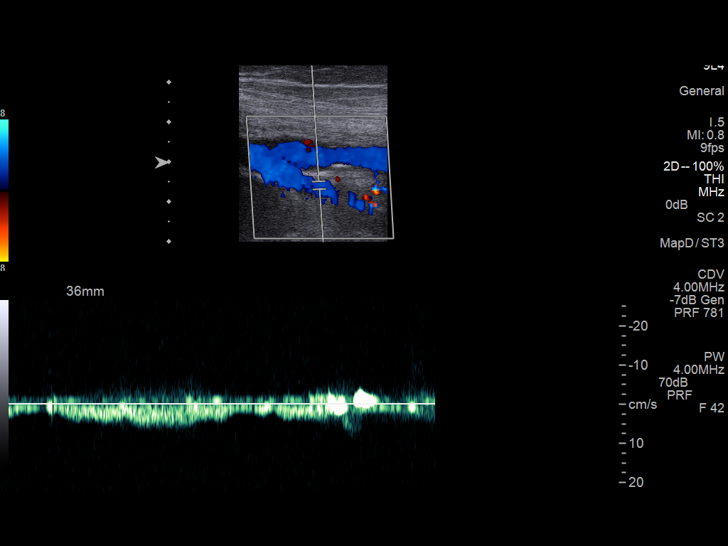
[im 16/34]
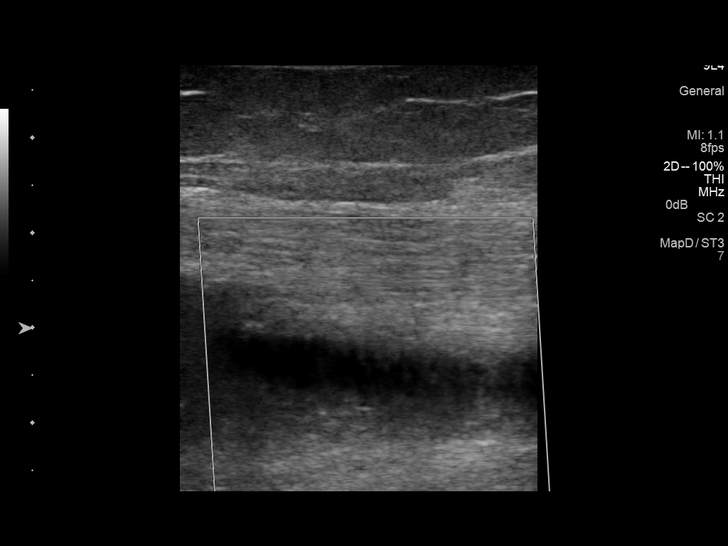
[im 18/34]
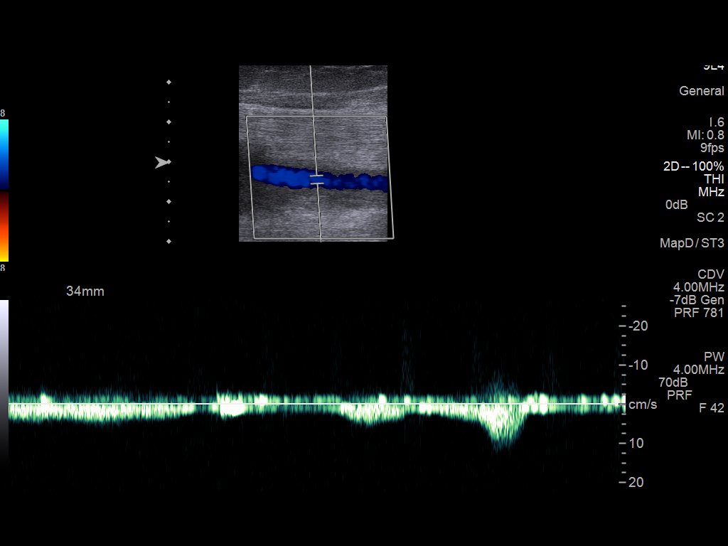
[im 21/34]
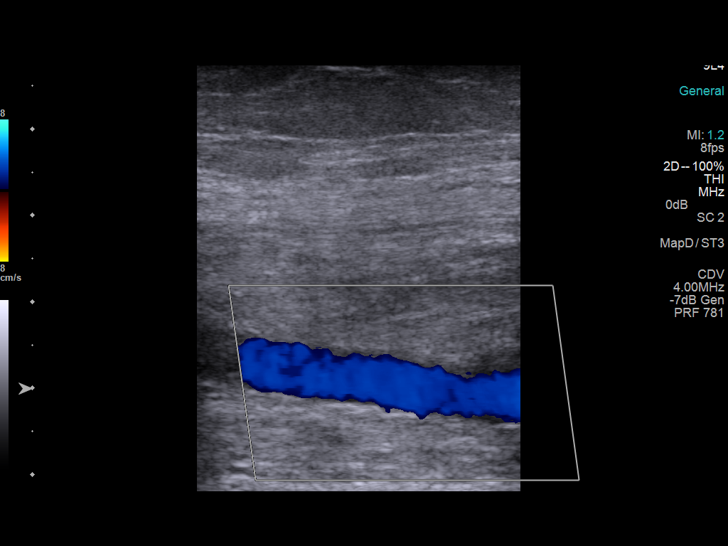
[im 23/34]
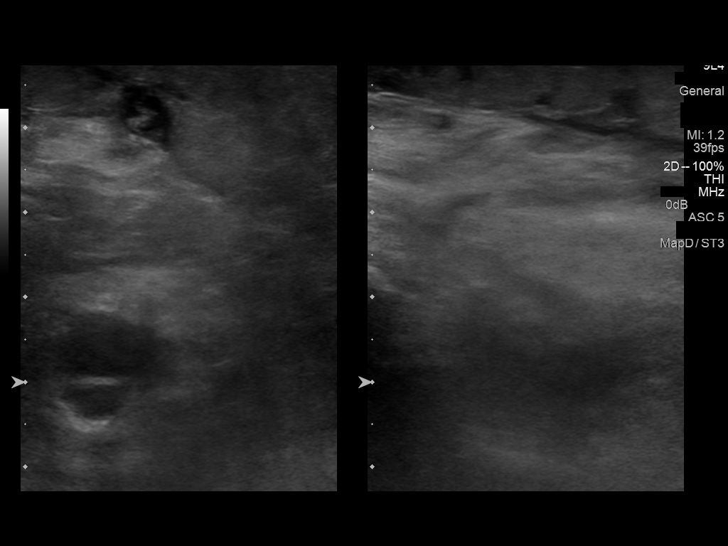
[im 26/34]
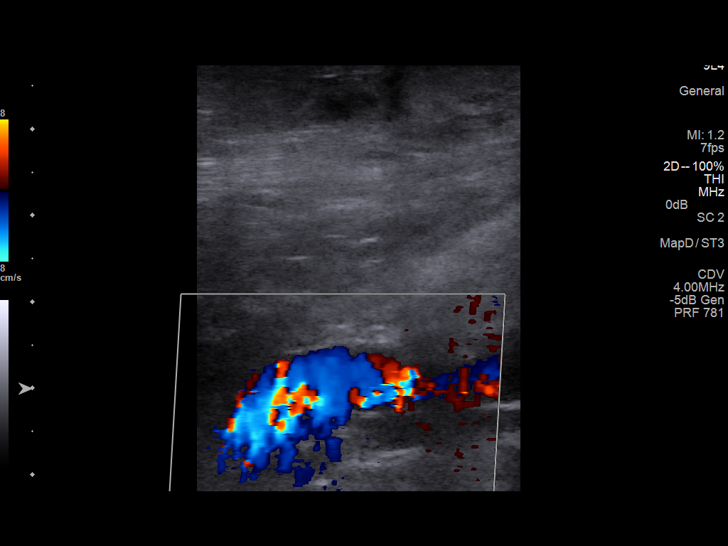
[im 28/34]
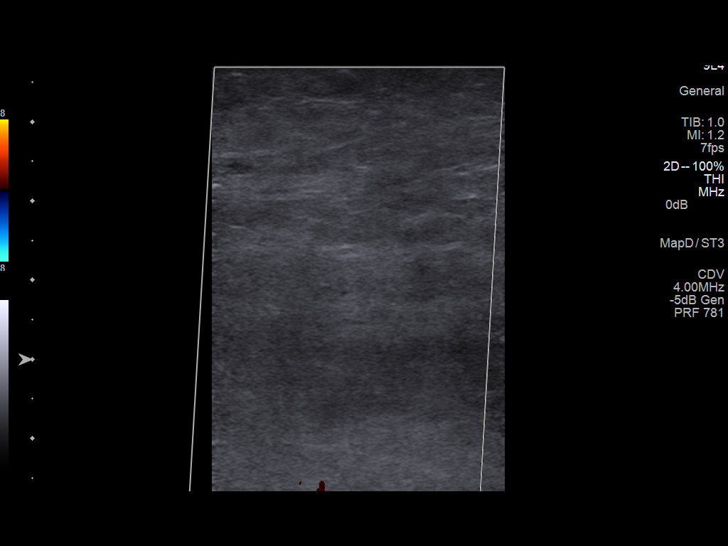
[im 31/34]
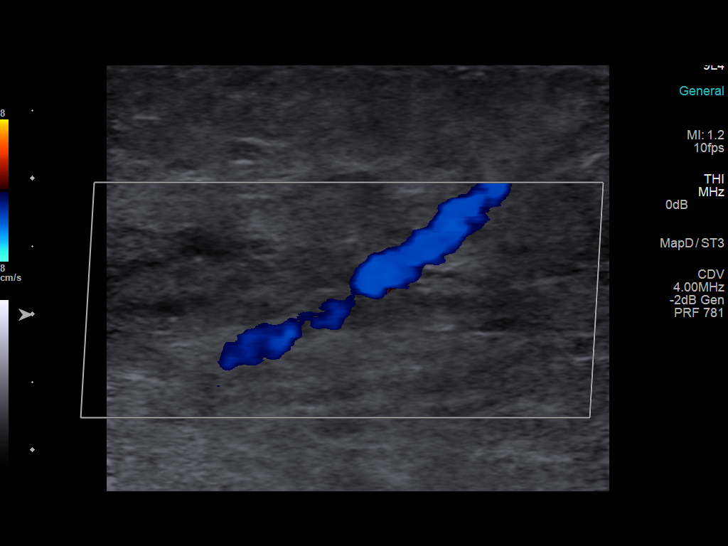
[im 34/34]
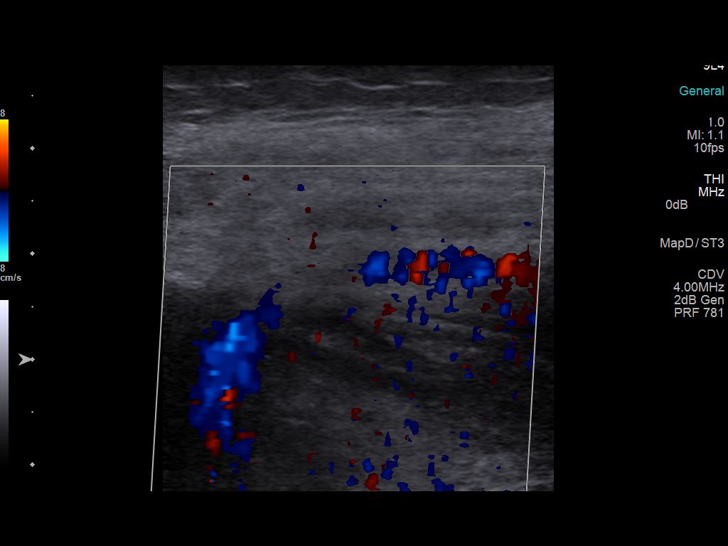

[14 of 24 positions shown; findings below may reference images not displayed]

FINDINGS: There is complete compressibility of the left common femoral,
femoral, and popliteal veins. Doppler analysis demonstrates
respiratory phasicity and augmentation of flow upon calf
compression. No evidence of calf vein DVT.
IMPRESSION: No evidence of left lower extremity DVT

## 2016-01-30 NOTE — Addendum Note (Signed)
Addended by: Lianne Cure A on: 01/30/2016 12:23 PM   Modules accepted: Orders

## 2016-04-23 DIAGNOSIS — A09 Infectious gastroenteritis and colitis, unspecified: Secondary | ICD-10-CM | POA: Diagnosis not present

## 2016-04-23 DIAGNOSIS — E1122 Type 2 diabetes mellitus with diabetic chronic kidney disease: Secondary | ICD-10-CM | POA: Diagnosis present

## 2016-04-23 DIAGNOSIS — Z87891 Personal history of nicotine dependence: Secondary | ICD-10-CM

## 2016-04-23 DIAGNOSIS — E782 Mixed hyperlipidemia: Secondary | ICD-10-CM | POA: Diagnosis present

## 2016-04-23 DIAGNOSIS — Z7982 Long term (current) use of aspirin: Secondary | ICD-10-CM

## 2016-04-23 DIAGNOSIS — Z885 Allergy status to narcotic agent status: Secondary | ICD-10-CM

## 2016-04-23 DIAGNOSIS — Z96642 Presence of left artificial hip joint: Secondary | ICD-10-CM | POA: Diagnosis present

## 2016-04-23 DIAGNOSIS — Z833 Family history of diabetes mellitus: Secondary | ICD-10-CM

## 2016-04-23 DIAGNOSIS — Z7984 Long term (current) use of oral hypoglycemic drugs: Secondary | ICD-10-CM

## 2016-04-23 DIAGNOSIS — I2510 Atherosclerotic heart disease of native coronary artery without angina pectoris: Secondary | ICD-10-CM | POA: Diagnosis present

## 2016-04-23 DIAGNOSIS — Z79899 Other long term (current) drug therapy: Secondary | ICD-10-CM

## 2016-04-23 DIAGNOSIS — Z8249 Family history of ischemic heart disease and other diseases of the circulatory system: Secondary | ICD-10-CM

## 2016-04-23 DIAGNOSIS — N183 Chronic kidney disease, stage 3 (moderate): Secondary | ICD-10-CM | POA: Diagnosis present

## 2016-04-23 DIAGNOSIS — K219 Gastro-esophageal reflux disease without esophagitis: Secondary | ICD-10-CM | POA: Diagnosis present

## 2016-04-23 DIAGNOSIS — I129 Hypertensive chronic kidney disease with stage 1 through stage 4 chronic kidney disease, or unspecified chronic kidney disease: Secondary | ICD-10-CM | POA: Diagnosis present

## 2016-04-23 DIAGNOSIS — R197 Diarrhea, unspecified: Secondary | ICD-10-CM | POA: Diagnosis not present

## 2016-04-24 ENCOUNTER — Encounter (HOSPITAL_COMMUNITY): Payer: Self-pay | Admitting: Emergency Medicine

## 2016-04-24 ENCOUNTER — Inpatient Hospital Stay (HOSPITAL_COMMUNITY)
Admission: EM | Admit: 2016-04-24 | Discharge: 2016-04-26 | DRG: 392 | Disposition: A | Discharge status: Home / Self Care | Payer: Medicare Other | Attending: Family Medicine | Admitting: Family Medicine

## 2016-04-24 ENCOUNTER — Emergency Department (HOSPITAL_COMMUNITY): Payer: Medicare Other

## 2016-04-24 DIAGNOSIS — Z885 Allergy status to narcotic agent status: Secondary | ICD-10-CM | POA: Diagnosis not present

## 2016-04-24 DIAGNOSIS — N183 Chronic kidney disease, stage 3 (moderate): Secondary | ICD-10-CM | POA: Diagnosis not present

## 2016-04-24 DIAGNOSIS — Z87891 Personal history of nicotine dependence: Secondary | ICD-10-CM | POA: Diagnosis not present

## 2016-04-24 DIAGNOSIS — I10 Essential (primary) hypertension: Secondary | ICD-10-CM | POA: Diagnosis not present

## 2016-04-24 DIAGNOSIS — K219 Gastro-esophageal reflux disease without esophagitis: Secondary | ICD-10-CM | POA: Diagnosis present

## 2016-04-24 DIAGNOSIS — Z7984 Long term (current) use of oral hypoglycemic drugs: Secondary | ICD-10-CM | POA: Diagnosis not present

## 2016-04-24 DIAGNOSIS — Z96642 Presence of left artificial hip joint: Secondary | ICD-10-CM | POA: Diagnosis present

## 2016-04-24 DIAGNOSIS — I129 Hypertensive chronic kidney disease with stage 1 through stage 4 chronic kidney disease, or unspecified chronic kidney disease: Secondary | ICD-10-CM | POA: Diagnosis present

## 2016-04-24 DIAGNOSIS — E782 Mixed hyperlipidemia: Secondary | ICD-10-CM | POA: Diagnosis present

## 2016-04-24 DIAGNOSIS — A09 Infectious gastroenteritis and colitis, unspecified: Secondary | ICD-10-CM | POA: Diagnosis present

## 2016-04-24 DIAGNOSIS — K529 Noninfective gastroenteritis and colitis, unspecified: Secondary | ICD-10-CM | POA: Diagnosis not present

## 2016-04-24 DIAGNOSIS — I2510 Atherosclerotic heart disease of native coronary artery without angina pectoris: Secondary | ICD-10-CM | POA: Diagnosis present

## 2016-04-24 DIAGNOSIS — Z7982 Long term (current) use of aspirin: Secondary | ICD-10-CM | POA: Diagnosis not present

## 2016-04-24 DIAGNOSIS — E119 Type 2 diabetes mellitus without complications: Secondary | ICD-10-CM

## 2016-04-24 DIAGNOSIS — Z833 Family history of diabetes mellitus: Secondary | ICD-10-CM | POA: Diagnosis not present

## 2016-04-24 DIAGNOSIS — R109 Unspecified abdominal pain: Secondary | ICD-10-CM

## 2016-04-24 DIAGNOSIS — D649 Anemia, unspecified: Secondary | ICD-10-CM | POA: Diagnosis present

## 2016-04-24 DIAGNOSIS — Z79899 Other long term (current) drug therapy: Secondary | ICD-10-CM | POA: Diagnosis not present

## 2016-04-24 DIAGNOSIS — K625 Hemorrhage of anus and rectum: Secondary | ICD-10-CM

## 2016-04-24 DIAGNOSIS — E1122 Type 2 diabetes mellitus with diabetic chronic kidney disease: Secondary | ICD-10-CM | POA: Diagnosis not present

## 2016-04-24 DIAGNOSIS — Z8249 Family history of ischemic heart disease and other diseases of the circulatory system: Secondary | ICD-10-CM | POA: Diagnosis not present

## 2016-04-24 DIAGNOSIS — R197 Diarrhea, unspecified: Secondary | ICD-10-CM | POA: Diagnosis present

## 2016-04-24 LAB — CBC WITH DIFFERENTIAL/PLATELET
BASOS PCT: 0 %
Basophils Absolute: 0 10*3/uL (ref 0.0–0.1)
Eosinophils Absolute: 0 10*3/uL (ref 0.0–0.7)
Eosinophils Relative: 0 %
HEMATOCRIT: 36.4 % (ref 36.0–46.0)
HEMOGLOBIN: 12.3 g/dL (ref 12.0–15.0)
Lymphocytes Relative: 9 %
Lymphs Abs: 1.3 10*3/uL (ref 0.7–4.0)
MCH: 30.8 pg (ref 26.0–34.0)
MCHC: 33.8 g/dL (ref 30.0–36.0)
MCV: 91.2 fL (ref 78.0–100.0)
MONOS PCT: 2 %
Monocytes Absolute: 0.3 10*3/uL (ref 0.1–1.0)
Neutro Abs: 13.6 10*3/uL — ABNORMAL HIGH (ref 1.7–7.7)
Neutrophils Relative %: 89 %
Platelets: 192 10*3/uL (ref 150–400)
RBC: 3.99 MIL/uL (ref 3.87–5.11)
RDW: 12.3 % (ref 11.5–15.5)
WBC: 15.3 10*3/uL — AB (ref 4.0–10.5)

## 2016-04-24 LAB — COMPREHENSIVE METABOLIC PANEL
ALK PHOS: 101 U/L (ref 38–126)
ALT: 13 U/L — ABNORMAL LOW (ref 14–54)
ANION GAP: 12 (ref 5–15)
AST: 25 U/L (ref 15–41)
Albumin: 4.4 g/dL (ref 3.5–5.0)
BUN: 33 mg/dL — ABNORMAL HIGH (ref 6–20)
CALCIUM: 10.1 mg/dL (ref 8.9–10.3)
CHLORIDE: 106 mmol/L (ref 101–111)
CO2: 24 mmol/L (ref 22–32)
Creatinine, Ser: 2.49 mg/dL — ABNORMAL HIGH (ref 0.44–1.00)
GFR calc non Af Amer: 17 mL/min — ABNORMAL LOW (ref >60)
GFR, EST AFRICAN AMERICAN: 20 mL/min — AB (ref >60)
Glucose, Bld: 238 mg/dL — ABNORMAL HIGH (ref 65–99)
Potassium: 4.5 mmol/L (ref 3.5–5.1)
SODIUM: 142 mmol/L (ref 135–145)
Total Bilirubin: 0.6 mg/dL (ref 0.3–1.2)
Total Protein: 8.3 g/dL — ABNORMAL HIGH (ref 6.5–8.1)

## 2016-04-24 LAB — BASIC METABOLIC PANEL
Anion gap: 12 (ref 5–15)
BUN: 30 mg/dL — AB (ref 6–20)
CO2: 23 mmol/L (ref 22–32)
CREATININE: 2.19 mg/dL — AB (ref 0.44–1.00)
Calcium: 9.4 mg/dL (ref 8.9–10.3)
Chloride: 105 mmol/L (ref 101–111)
GFR calc non Af Amer: 20 mL/min — ABNORMAL LOW (ref >60)
GFR, EST AFRICAN AMERICAN: 23 mL/min — AB (ref >60)
Glucose, Bld: 174 mg/dL — ABNORMAL HIGH (ref 65–99)
POTASSIUM: 3.9 mmol/L (ref 3.5–5.1)
SODIUM: 140 mmol/L (ref 135–145)

## 2016-04-24 LAB — CBC
HEMATOCRIT: 37.1 % (ref 36.0–46.0)
Hemoglobin: 12.4 g/dL (ref 12.0–15.0)
MCH: 30.5 pg (ref 26.0–34.0)
MCHC: 33.4 g/dL (ref 30.0–36.0)
MCV: 91.4 fL (ref 78.0–100.0)
Platelets: 176 10*3/uL (ref 150–400)
RBC: 4.06 MIL/uL (ref 3.87–5.11)
RDW: 12.4 % (ref 11.5–15.5)
WBC: 14.2 10*3/uL — AB (ref 4.0–10.5)

## 2016-04-24 LAB — GASTROINTESTINAL PANEL BY PCR, STOOL (REPLACES STOOL CULTURE)
ASTROVIRUS: NOT DETECTED
Adenovirus F40/41: NOT DETECTED
CYCLOSPORA CAYETANENSIS: NOT DETECTED
Campylobacter species: NOT DETECTED
Cryptosporidium: NOT DETECTED
ENTAMOEBA HISTOLYTICA: NOT DETECTED
ENTEROAGGREGATIVE E COLI (EAEC): NOT DETECTED
ENTEROTOXIGENIC E COLI (ETEC): NOT DETECTED
Enteropathogenic E coli (EPEC): NOT DETECTED
GIARDIA LAMBLIA: NOT DETECTED
NOROVIRUS GI/GII: NOT DETECTED
Plesimonas shigelloides: NOT DETECTED
Rotavirus A: NOT DETECTED
SAPOVIRUS (I, II, IV, AND V): NOT DETECTED
SHIGA LIKE TOXIN PRODUCING E COLI (STEC): NOT DETECTED
Salmonella species: NOT DETECTED
Shigella/Enteroinvasive E coli (EIEC): NOT DETECTED
VIBRIO CHOLERAE: NOT DETECTED
VIBRIO SPECIES: NOT DETECTED
Yersinia enterocolitica: NOT DETECTED

## 2016-04-24 LAB — GLUCOSE, CAPILLARY
GLUCOSE-CAPILLARY: 137 mg/dL — AB (ref 65–99)
GLUCOSE-CAPILLARY: 66 mg/dL (ref 65–99)
Glucose-Capillary: 125 mg/dL — ABNORMAL HIGH (ref 65–99)
Glucose-Capillary: 100 mg/dL — ABNORMAL HIGH (ref 65–99)

## 2016-04-24 LAB — C DIFFICILE QUICK SCREEN W PCR REFLEX
C DIFFICLE (CDIFF) ANTIGEN: NEGATIVE
C Diff interpretation: NOT DETECTED
C Diff toxin: NEGATIVE

## 2016-04-24 LAB — PROTIME-INR
INR: 1.06
Prothrombin Time: 13.8 seconds (ref 11.4–15.2)

## 2016-04-24 LAB — SAMPLE TO BLOOD BANK

## 2016-04-24 LAB — APTT: APTT: 31 s (ref 24–36)

## 2016-04-24 LAB — POC OCCULT BLOOD, ED: FECAL OCCULT BLD: POSITIVE — AB

## 2016-04-24 MED ORDER — INSULIN ASPART 100 UNIT/ML ~~LOC~~ SOLN
0.0000 [IU] | Freq: Three times a day (TID) | SUBCUTANEOUS | Status: DC
  Administered 2016-04-24 (×2): 1 [IU] via SUBCUTANEOUS

## 2016-04-24 MED ORDER — PANTOPRAZOLE SODIUM 40 MG PO TBEC
40.0000 mg | DELAYED_RELEASE_TABLET | Freq: Every day | ORAL | Status: DC
  Administered 2016-04-24 – 2016-04-26 (×3): 40 mg via ORAL
  Filled 2016-04-24 (×3): qty 1

## 2016-04-24 MED ORDER — IOPAMIDOL (ISOVUE-300) INJECTION 61%
INTRAVENOUS | Status: AC
  Filled 2016-04-24: qty 30

## 2016-04-24 MED ORDER — ACETAMINOPHEN 650 MG RE SUPP: 650.0000 mg | Freq: Four times a day (QID) | RECTAL | Status: DC | PRN

## 2016-04-24 MED ORDER — METOPROLOL SUCCINATE ER 25 MG PO TB24
25.0000 mg | ORAL_TABLET | Freq: Every day | ORAL | Status: DC
  Administered 2016-04-24 – 2016-04-26 (×3): 25 mg via ORAL
  Filled 2016-04-24 (×3): qty 1

## 2016-04-24 MED ORDER — SODIUM CHLORIDE 0.9 % IV BOLUS (SEPSIS)
1000.0000 mL | Freq: Once | INTRAVENOUS | Status: AC
  Administered 2016-04-24: 1000 mL via INTRAVENOUS

## 2016-04-24 MED ORDER — ACETAMINOPHEN 325 MG PO TABS: 650.0000 mg | ORAL_TABLET | Freq: Four times a day (QID) | ORAL | Status: DC | PRN

## 2016-04-24 MED ORDER — METRONIDAZOLE IN NACL 5-0.79 MG/ML-% IV SOLN
500.0000 mg | Freq: Three times a day (TID) | INTRAVENOUS | Status: DC
  Administered 2016-04-24 – 2016-04-25 (×3): 500 mg via INTRAVENOUS
  Filled 2016-04-24 (×3): qty 100

## 2016-04-24 MED ORDER — ONDANSETRON HCL 4 MG/2ML IJ SOLN: 4.0000 mg | Freq: Four times a day (QID) | INTRAMUSCULAR | Status: DC | PRN

## 2016-04-24 MED ORDER — SIMVASTATIN 10 MG PO TABS
20.0000 mg | ORAL_TABLET | Freq: Every day | ORAL | Status: DC
  Administered 2016-04-24 – 2016-04-25 (×2): 20 mg via ORAL
  Filled 2016-04-24 (×2): qty 2

## 2016-04-24 MED ORDER — LEVOTHYROXINE SODIUM 50 MCG PO TABS
25.0000 ug | ORAL_TABLET | Freq: Every day | ORAL | Status: DC
  Administered 2016-04-24 – 2016-04-26 (×3): 25 ug via ORAL
  Filled 2016-04-24 (×3): qty 1

## 2016-04-24 MED ORDER — SODIUM CHLORIDE 0.9% FLUSH
3.0000 mL | Freq: Two times a day (BID) | INTRAVENOUS | Status: DC
  Administered 2016-04-24 – 2016-04-25 (×2): 3 mL via INTRAVENOUS

## 2016-04-24 MED ORDER — CIPROFLOXACIN IN D5W 400 MG/200ML IV SOLN
400.0000 mg | Freq: Once | INTRAVENOUS | Status: AC
  Administered 2016-04-24: 400 mg via INTRAVENOUS
  Filled 2016-04-24: qty 200

## 2016-04-24 MED ORDER — ASPIRIN EC 81 MG PO TBEC
81.0000 mg | DELAYED_RELEASE_TABLET | Freq: Every day | ORAL | Status: DC
  Administered 2016-04-24 – 2016-04-26 (×3): 81 mg via ORAL
  Filled 2016-04-24 (×3): qty 1

## 2016-04-24 MED ORDER — ONDANSETRON HCL 4 MG PO TABS: 4.0000 mg | ORAL_TABLET | Freq: Four times a day (QID) | ORAL | Status: DC | PRN

## 2016-04-24 MED ORDER — SODIUM CHLORIDE 0.9 % IV SOLN
INTRAVENOUS | Status: DC
  Administered 2016-04-24 (×2): via INTRAVENOUS

## 2016-04-24 MED ORDER — CIPROFLOXACIN IN D5W 400 MG/200ML IV SOLN
400.0000 mg | INTRAVENOUS | Status: DC
  Administered 2016-04-25: 400 mg via INTRAVENOUS
  Filled 2016-04-24: qty 200

## 2016-04-24 MED ORDER — METRONIDAZOLE IN NACL 5-0.79 MG/ML-% IV SOLN
500.0000 mg | Freq: Once | INTRAVENOUS | Status: AC
  Administered 2016-04-24: 500 mg via INTRAVENOUS
  Filled 2016-04-24: qty 100

## 2016-04-24 NOTE — ED Triage Notes (Signed)
Pt C/O of generalized abdominal pain that started after she at at 1800. Pt states she had "diarrhea with blood clots in them" around 1800. NAD. Denies N/V

## 2016-04-24 NOTE — ED Provider Notes (Addendum)
[facility] DEPT Provider Note   CSN: [id number] Arrival date & time: 04/23/16  2351   By signing my name below, I, Eunice Blase, attest that this documentation has been prepared under the direction and in the presence of Rolland Porter, MD. Electronically signed, Eunice Blase, ED Scribe. 04/24/16. 12:45 AM.   Time seen 12:31 AM  History   Chief Complaint Chief Complaint  Patient presents with  . Diarrhea   The history is provided by the patient, medical records and a relative. No language interpreter was used.    HPI Comments: Marie Hunt is a 83 y.o. female with Hx of polyps who presents to the Emergency Department complaining of recurring abdominal pain since 04/19/2016. Patient states family 24th she had a lot of abdominal cramping with sweating and had a couple of stools and then her pain felt better. Today, January 28 she ate spaghetti string beans about 2:30 PM. An hour later she started again having diffuse abdominal pain and cramping. She states she passed blood 3. They describe it as bright red blood and clots. The last time she passed blood was 10:45 PM. She states her abdominal cramping is improved. She denies been on blood thinners. She's never had rectal bleeding before. She denies nausea, vomiting, feeling dizzy, lightheaded or weak. She states she had a colonoscopy done about 2 years ago at [facility]. . Hx of polyps noted ~2 year ago.  Pt lives at home alone. No Hx of bloody stool noted. Hx of heart murmur noted, managed by PCP in [place]. Pt denies nausea, vomiting, dizziness, lightheadedness and weakness.  PCP Monico Blitz, MD    Past Medical History:  Diagnosis Date  . Anemia   . Carotid artery disease (HCC)    4-85% RICA, patent LICA 4/62 - Dr. Scot Dock  . Cholelithiasis   . Cholelithiasis    In need of cholecystectomy  . Diabetes mellitus    Type II  . Essential hypertension, benign   . GERD (gastroesophageal reflux disease)   . GERD  (gastroesophageal reflux disease)   . Mixed hyperlipidemia   . Sigmoid diverticulosis   . Sigmoid diverticulosis   . Type 2 diabetes mellitus [facility])     Patient Active Problem List   Diagnosis Date Noted  . Cough 10/17/2014  . Renal insufficiency 10/17/2014  . UTI (urinary tract infection) 10/07/2014  . Anemia 10/07/2014  . Fever   . Type 2 diabetes mellitus without complication ([place])   . Hip fracture ([place]) 10/02/2014  . Hip fx ([place]) 10/02/2014  . Type 2 diabetes mellitus ([place])   . Fall   . Aftercare following surgery of the circulatory system, [place] 10/26/2013  . Occlusion and stenosis of carotid artery without mention of cerebral infarction 09/17/2011  . Preoperative evaluation to rule out surgical contraindication 11/15/2010  . Cardiac murmur 11/15/2010  . Hypertension 11/08/2010  . Carotid artery disease ([place]) 11/08/2010  . Diabetes in pregnancy 11/08/2010  . Hyperlipidemia LDL goal < 100 11/08/2010  . Cholelithiasis 11/08/2010    Past Surgical History:  Procedure Laterality Date  . CAROTID ENDARTERECTOMY  08/24/2009   Left catroid endarterectomy  . CATARACT EXTRACTION W/PHACO Left 08/17/2014   Procedure: CATARACT EXTRACTION PHACO AND INTRAOCULAR LENS PLACEMENT; CDE:  7.39;  Surgeon: Tonny Branch, MD;  Location: [facility] ORS;  Service: Ophthalmology;  Laterality: Left;  . CATARACT EXTRACTION W/PHACO Right 10/02/2014   Procedure: CATARACT EXTRACTION PHACO AND INTRAOCULAR LENS PLACEMENT RIGHT EYE CDE=5.83;  Surgeon: Tonny Branch, MD;  Location: [facility] ORS;  Service: Ophthalmology;  Laterality: Right;  . EYE SURGERY    . HIP ARTHROPLASTY Left 10/03/2014   Procedure: ARTHROPLASTY BIPOLAR HIP (HEMIARTHROPLASTY);  Surgeon: Sanjuana Kava, MD;  Location: [facility] ORS;  Service: Orthopedics;  Laterality: Left;  . Left carotid endarterectomy  7/11   Dr. Scot Dock    OB History    No data available       Home Medications    Prior to Admission medications   Medication Sig Start Date End Date Taking?  Authorizing Provider  amLODipine (NORVASC) 10 MG tablet Take 1 tablet (10 mg total) by mouth daily. 10/07/14  Yes [name], MD  aspirin 81 MG tablet Take 81 mg by mouth daily.     Yes Historical Provider, MD  B Complex Vitamins (B-COMPLEX/B-12 PO) Take 1,000 mg by mouth daily.   Yes Historical Provider, MD  calcium-vitamin D (OSCAL WITH D) 500-200 MG-UNIT per tablet Take 1 tablet by mouth daily.     Yes Historical Provider, MD  Calcium-Vitamin D 600-200 MG-UNIT per tablet Take 1 tablet by mouth daily.   Yes Historical Provider, MD  ferrous sulfate 325 (65 FE) MG tablet Take 325 mg by mouth daily with breakfast.     Yes Historical Provider, MD  folic acid (FOLVITE) 1 MG tablet Take 1 mg by mouth daily.   Yes Historical Provider, MD  furosemide (LASIX) 40 MG tablet Take 1 tablet (40 mg total) by mouth daily as needed for fluid. 10/07/14  Yes [name], MD  glimepiride (AMARYL) 2 MG tablet Take 2 mg by mouth daily before breakfast.     Yes Historical Provider, MD  ketorolac (ACULAR) 0.5 % ophthalmic solution Apply 1 drop to eye 2 (two) times daily. To start 3 days prior to operation (10/02/14-scheduled date for operation), then continue as directed 09/08/14  Yes Historical Provider, MD  levothyroxine (SYNTHROID, LEVOTHROID) 25 MCG tablet Take 25 mcg by mouth daily before breakfast.   Yes Historical Provider, MD  metoprolol succinate (TOPROL-XL) 25 MG 24 hr tablet Take 25 mg by mouth daily.     Yes Historical Provider, MD  omeprazole (PRILOSEC) 20 MG capsule Take 20 mg by mouth 2 (two) times daily.    Yes Historical Provider, MD  simvastatin (ZOCOR) 20 MG tablet Take 20 mg by mouth at bedtime.     Yes Historical Provider, MD  enoxaparin (LOVENOX) 30 MG/0.3ML injection Inject 0.3 mLs (30 mg total) into the skin daily. Until 9/5 Patient not taking: Reported on 11/07/2015 10/07/14   [name], MD  HYDROcodone-acetaminophen (NORCO/VICODIN) 5-325 MG per tablet Take 1-2 tablets by mouth every 6 (six)  hours as needed for moderate pain. Do not exceed 3gm of APAP/24 hours from all sources Patient not taking: Reported on 11/07/2015 10/16/14   Tiffany L Reed, DO  lidocaine (XYLOCAINE) 5 % ointment Apply 1 application topically 3 (three) times daily as needed. For back pain 09/04/14   Historical Provider, MD  ofloxacin (OCUFLOX) 0.3 % ophthalmic solution Apply 1 drop to eye 4 (four) times daily. *to start 3 days prior to operation (10/02/14-scheduled operation) and to continue for 1 week after the surgery 09/08/14   Historical Provider, MD  prednisoLONE acetate (PRED FORTE) 1 % ophthalmic suspension Apply 1 drop to eye 3 (three) times daily. *To start following surgical procedure (10/02/14--scheduled surgery date) and continue 09/07/14   Historical Provider, MD    Family History Family History  Problem Relation Age of Onset  . Diabetes Mother   . Diabetes Father   .  Heart attack Daughter   . Other Daughter     Bleeding problems  . Cancer Daughter   . Coronary artery disease    . Heart disease Sister   . Diabetes Sister   . Hyperlipidemia Sister   . Hypertension Sister   . Heart disease Sister   . Heart disease Sister     Social History Social History  Substance Use Topics  . Smoking status: Former Smoker    Packs/day: 0.30    Years: 60.00    Types: Cigarettes    Quit date: 10/27/2011  . Smokeless tobacco: Never Used  . Alcohol use No  lives at home Lives alone   Allergies   Codeine   Review of Systems Review of Systems  All other systems reviewed and are negative.  A complete 10 system review of systems was obtained and all systems are negative except as noted in the HPI and PMH.    Physical Exam Updated Vital Signs BP 184/70 (BP Location: Right Arm)   Pulse 89   Temp 99.1 F (37.3 C) (Oral)   Resp 17   Ht 5\' 4"  (1.626 m)   Wt 156 lb (70.8 kg)   SpO2 99%   BMI 26.78 kg/m   Physical Exam  Constitutional: She is oriented to person, place, and time. She appears  well-developed and well-nourished.  Non-toxic appearance. She does not appear ill. No distress.  HENT:  Head: Normocephalic and atraumatic.  Right Ear: External ear normal.  Left Ear: External ear normal.  Nose: Nose normal. No mucosal edema or rhinorrhea.  Mouth/Throat: Oropharynx is clear and moist and mucous membranes are normal. No dental abscesses or uvula swelling.  telangectasias noted to tongue   Eyes: Conjunctivae and EOM are normal. Pupils are equal, round, and reactive to light.  Conjunctiva are not pale  Neck: Normal range of motion and full passive range of motion without pain. Neck supple.  Cardiovascular: Normal rate and regular rhythm.  Exam reveals no gallop and no friction rub.   Murmur heard.  Systolic murmur is present  High-pitched systolic murmur heard best in the right upper sternal border and left upper sternal border  Pulmonary/Chest: Effort normal and breath sounds normal. No respiratory distress. She has no wheezes. She has no rhonchi. She has no rales. She exhibits no tenderness and no crepitus.  Abdominal: Soft. Normal appearance and bowel sounds are normal. She exhibits no distension. There is tenderness. There is no rebound and no guarding.    Tenderness in the left mid-lower abdomen  Musculoskeletal: Normal range of motion. She exhibits no edema or tenderness.  Moves all extremities well.   Neurological: She is alert and oriented to person, place, and time. She has normal strength. No cranial nerve deficit.  Skin: Skin is warm, dry and intact. No rash noted. No erythema. No pallor.  Psychiatric: She has a normal mood and affect. Her speech is normal and behavior is normal. Her mood appears not anxious.  Nursing note and vitals reviewed.    ED Treatments / Results  DIAGNOSTIC STUDIES: Oxygen Saturation is 99% on RA, normal by my interpretation.     Labs (all labs ordered are listed, but only abnormal results are displayed) Results for orders placed or  performed during the hospital encounter of 04/24/16  Comprehensive metabolic panel  Result Value Ref Range   Sodium 142 135 - 145 mmol/L   Potassium 4.5 3.5 - 5.1 mmol/L   Chloride 106 101 - 111 mmol/L  CO2 24 22 - 32 mmol/L   Glucose, Bld 238 (H) 65 - 99 mg/dL   BUN 33 (H) 6 - 20 mg/dL   Creatinine, Ser 2.49 (H) 0.44 - 1.00 mg/dL   Calcium 10.1 8.9 - 10.3 mg/dL   Total Protein 8.3 (H) 6.5 - 8.1 g/dL   Albumin 4.4 3.5 - 5.0 g/dL   AST 25 15 - 41 U/L   ALT 13 (L) 14 - 54 U/L   Alkaline Phosphatase 101 38 - 126 U/L   Total Bilirubin 0.6 0.3 - 1.2 mg/dL   GFR calc non Af Amer 17 (L) >60 mL/min   GFR calc Af Amer 20 (L) >60 mL/min   Anion gap 12 5 - 15  CBC with Differential  Result Value Ref Range   WBC 15.3 (H) 4.0 - 10.5 K/uL   RBC 3.99 3.87 - 5.11 MIL/uL   Hemoglobin 12.3 12.0 - 15.0 g/dL   HCT 36.4 36.0 - 46.0 %   MCV 91.2 78.0 - 100.0 fL   MCH 30.8 26.0 - 34.0 pg   MCHC 33.8 30.0 - 36.0 g/dL   RDW 12.3 11.5 - 15.5 %   Platelets 192 150 - 400 K/uL   Neutrophils Relative % 89 %   Neutro Abs 13.6 (H) 1.7 - 7.7 K/uL   Lymphocytes Relative 9 %   Lymphs Abs 1.3 0.7 - 4.0 K/uL   Monocytes Relative 2 %   Monocytes Absolute 0.3 0.1 - 1.0 K/uL   Eosinophils Relative 0 %   Eosinophils Absolute 0.0 0.0 - 0.7 K/uL   Basophils Relative 0 %   Basophils Absolute 0.0 0.0 - 0.1 K/uL  Protime-INR  Result Value Ref Range   Prothrombin Time 13.8 11.4 - 15.2 seconds   INR 1.06   APTT  Result Value Ref Range   aPTT 31 24 - 36 seconds  POC occult blood, ED RN will collect  Result Value Ref Range   Fecal Occult Bld POSITIVE (A) NEGATIVE  Sample to Blood Bank  Result Value Ref Range   Blood Bank Specimen [id number]    Sample Expiration 04/25/2016    Laboratory interpretation all normal except resolved anemia, renal insufficieny, + hemoccult, leukocytosis    EKG  EKG Interpretation None       Radiology Ct Abdomen Pelvis Wo Contrast  Result Date: 04/24/2016 CLINICAL DATA:   Abdominal pain, unspecified location.  GI bleed. EXAM: CT ABDOMEN AND PELVIS WITHOUT CONTRAST TECHNIQUE: Multidetector CT imaging of the abdomen and pelvis was performed following the standard protocol without IV contrast. COMPARISON:  None. FINDINGS: Lower chest: Linear atelectasis in the left lower lobe. Slightly more nodular opacity at the confluence of atelectasis is likely secondary to scarring, no discrete pulmonary nodule coronary artery calcifications. Coarse calcification about the distal thoracic aorta. Hepatobiliary: Calcified gallstones within the gallbladder. No pericholecystic inflammation. Gallbladder appears displaced inferiorly. No biliary dilatation. No focal hepatic lesion allowing for lack contrast. Pancreas: No ductal dilatation or inflammation. Spleen: Normal in size without focal abnormality. Adrenals/Urinary Tract: Thickening on the left adrenal gland without discrete nodule. Right adrenal gland is normal. No hydronephrosis or perinephric edema. Urinary bladder appears physiologically distended, partially obscured by streak artifact from left hip prosthesis. Stomach/Bowel: Thickening of the distal esophagus with small hiatal hernia. Small posterior gastric diverticulum just distal to the gastroesophageal junction. No small bowel dilatation or inflammation. Normal appendix. Moderate stool in the right colon. More distal colon is decompressed. There is mild colonic wall thickening involving the mid transverse  and descending colon, questionably of sigmoid colon. Mild associated pericolonic edema involving the splenic flexure and proximal descending. Sigmoid colonic diverticulosis without acute inflammation. Vascular/Lymphatic: Aortic and branch atherosclerosis without aneurysm. No evidence of adenopathy. Reproductive: Uterus is atrophic, normal for age. No evidence of adnexal mass. Other: No free air or free fluid.  No abdominal wall hernia. Musculoskeletal: Degenerative change in the lumbar  spine. Left hip arthroplasty. The bones are diffusely under mineralized. IMPRESSION: 1. Mild colitis involving the distal transverse through descending colon. This may be infectious or inflammatory. Distribution is greater than would be expected for ischemic colitis. 2. Sigmoid diverticulosis without acute inflammation. 3. Cholelithiasis without gallbladder inflammation. 4. Aortic atherosclerosis. Electronically Signed   By: Jeb Levering M.D.   On: 04/24/2016 02:38    Procedures Procedures (including critical care time)  Medications Ordered in ED Medications  iopamidol (ISOVUE-300) 61 % injection (not administered)  ciprofloxacin (CIPRO) IVPB 400 mg (not administered)  metroNIDAZOLE (FLAGYL) IVPB 500 mg (not administered)  sodium chloride 0.9 % bolus 1,000 mL (0 mLs Intravenous Stopped 04/24/16 0227)     Initial Impression / Assessment and Plan / ED Course  I have reviewed the triage vital signs and the nursing notes.  Pertinent labs & imaging results that were available during my care of the patient were reviewed by me and considered in my medical decision making (see chart for details).     COORDINATION OF CARE: 12:42 AM Discussed treatment plan with pt at bedside and pt agreed to plan. Will order IV fluids and medications. AP CT ordered.   Pt's CT changed to non-IV contrast due to renal insufficiency  02:42 AM I reviewed her AP CT, she was started on IV cipro and flagyl.  02:48 AM discussed her test results with patient, she is agreeable to admission. She denies abdominal pain, but states she feels the urge to defecate, but doesn't.  02:51 AM Dr Marin Comment, hospitalist, will admit.   Orthostatic VS for the past 24 hrs:  BP- Lying Pulse- Lying BP- Sitting Pulse- Sitting BP- Standing at 0 minutes Pulse- Standing at 0 minutes  04/24/16 0202 165/63 79 167/70 84 178/62 91   Normal orthostatics   03:30 AM patient went to the bathroom       Final Clinical Impressions(s) / ED  Diagnoses   Final diagnoses:  Bright red rectal bleeding  Colitis    Plan admission  Rolland Porter, MD, FACEP  I personally performed the services described in this documentation, which was scribed in my presence. The recorded information has been reviewed and considered.  Rolland Porter, MD, Barbette Or, MD 04/24/16 [place], MD 04/24/16 [phone]

## 2016-04-24 NOTE — H&P (Signed)
History and Physical    Marie Hunt:[id number] DOB: December 27, 1932 DOA: 04/24/2016  PCP: Monico Blitz, MD  Patient coming from: Home.    Chief Complaint:  Bloody diarrhea and abdominal cramps.   HPI: Marie Hunt is an 83 y.o. female with hx of anemia, CAD, DM, hx of cholelithiasis, hx of CKD with baseline Cr 2.1, lives alone, presented to the ER with 2 days hx of watery diarrhea followed by bloody diarrhea.  She has abdominal cramps.  No known contact, distant travel or recent anbitiotics. She had colonoscopy from a physcian in [place] about 2 years ago, according to her, and had diverticular disease and a polyp removed.  Evaluation in the ER showed stable hemodynamics, Hb of 12g/dL, WBC of 15K,  Cr of 2.46, K of 4.5, and BS of 238.  An abdominal pelvic CT showed distant transverse and descending colon colitis.  Hospitalist was asked to admit her for further eval and Tx.   ED Course:  See above.  Rewiew of Systems:  Constitutional: Negative for malaise, fever and chills. No significant weight loss or weight gain Eyes: Negative for eye pain, redness and discharge, diplopia, visual changes, or flashes of light. ENMT: Negative for ear pain, hoarseness, nasal congestion, sinus pressure and sore throat. No headaches; tinnitus, drooling, or problem swallowing. Cardiovascular: Negative for chest pain, palpitations, diaphoresis, dyspnea and peripheral edema. ; No orthopnea, PND Respiratory: Negative for cough, hemoptysis, wheezing and stridor. No pleuritic chestpain. Gastrointestinal: Negative for constipation,  Melena, hematemesis, jaundice  Genitourinary: Negative for frequency, dysuria, incontinence,flank pain and hematuria; Musculoskeletal: Negative for back pain and neck pain. Negative for swelling and trauma.;  Skin: . Negative for pruritus, rash, abrasions, bruising and skin lesion.; ulcerations Neuro: Negative for headache, lightheadedness and neck stiffness. Negative for weakness,  altered level of consciousness , altered mental status, extremity weakness, burning feet, involuntary movement, seizure and syncope.  Psych: negative for anxiety, depression, insomnia, tearfulness, panic attacks, hallucinations, paranoia, suicidal or homicidal ideation   Past Medical History:  Diagnosis Date  . Anemia   . Carotid artery disease (HCC)    3-38% RICA, patent LICA 2/50 - Dr. Scot Dock  . Cholelithiasis   . Cholelithiasis    In need of cholecystectomy  . Diabetes mellitus    Type II  . Essential hypertension, benign   . GERD (gastroesophageal reflux disease)   . GERD (gastroesophageal reflux disease)   . Mixed hyperlipidemia   . Sigmoid diverticulosis   . Sigmoid diverticulosis   . Type 2 diabetes mellitus ([place])     Past Surgical History:  Procedure Laterality Date  . CAROTID ENDARTERECTOMY  08/24/2009   Left catroid endarterectomy  . CATARACT EXTRACTION W/PHACO Left 08/17/2014   Procedure: CATARACT EXTRACTION PHACO AND INTRAOCULAR LENS PLACEMENT; CDE:  7.39;  Surgeon: Tonny Branch, MD;  Location: [facility];  Service: Ophthalmology;  Laterality: Left;  . CATARACT EXTRACTION W/PHACO Right 10/02/2014   Procedure: CATARACT EXTRACTION PHACO AND INTRAOCULAR LENS PLACEMENT RIGHT EYE CDE=5.83;  Surgeon: Tonny Branch, MD;  Location: [facility];  Service: Ophthalmology;  Laterality: Right;  . EYE SURGERY    . HIP ARTHROPLASTY Left 10/03/2014   Procedure: ARTHROPLASTY BIPOLAR HIP (HEMIARTHROPLASTY);  Surgeon: Sanjuana Kava, MD;  Location: [facility];  Service: Orthopedics;  Laterality: Left;  . Left carotid endarterectomy  7/11   Dr. Scot Dock     reports that she quit smoking about 4 years ago. Her smoking use included Cigarettes. She has a 18.00 pack-year smoking history. She has never used  smokeless tobacco. She reports that she does not drink alcohol or use drugs.  Allergies  Allergen Reactions  . Codeine Nausea And Vomiting    Family History  Problem Relation Age of Onset  . Diabetes  Mother   . Diabetes Father   . Heart attack Daughter   . Other Daughter     Bleeding problems  . Cancer Daughter   . Coronary artery disease    . Heart disease Sister   . Diabetes Sister   . Hyperlipidemia Sister   . Hypertension Sister   . Heart disease Sister   . Heart disease Sister      Prior to Admission medications   Medication Sig Start Date End Date Taking? Authorizing Provider  amLODipine (NORVASC) 10 MG tablet Take 1 tablet (10 mg total) by mouth daily. 10/07/14  Yes [name], MD  aspirin 81 MG tablet Take 81 mg by mouth daily.     Yes Historical Provider, MD  B Complex Vitamins (B-COMPLEX/B-12 PO) Take 1,000 mg by mouth daily.   Yes Historical Provider, MD  calcium-vitamin D (OSCAL WITH D) 500-200 MG-UNIT per tablet Take 1 tablet by mouth daily.     Yes Historical Provider, MD  Calcium-Vitamin D 600-200 MG-UNIT per tablet Take 1 tablet by mouth daily.   Yes Historical Provider, MD  ferrous sulfate 325 (65 FE) MG tablet Take 325 mg by mouth daily with breakfast.     Yes Historical Provider, MD  folic acid (FOLVITE) 1 MG tablet Take 1 mg by mouth daily.   Yes Historical Provider, MD  furosemide (LASIX) 40 MG tablet Take 1 tablet (40 mg total) by mouth daily as needed for fluid. 10/07/14  Yes [name], MD  glimepiride (AMARYL) 2 MG tablet Take 2 mg by mouth daily before breakfast.     Yes Historical Provider, MD  ketorolac (ACULAR) 0.5 % ophthalmic solution Apply 1 drop to eye 2 (two) times daily. To start 3 days prior to operation (10/02/14-scheduled date for operation), then continue as directed 09/08/14  Yes Historical Provider, MD  levothyroxine (SYNTHROID, LEVOTHROID) 25 MCG tablet Take 25 mcg by mouth daily before breakfast.   Yes Historical Provider, MD  metoprolol succinate (TOPROL-XL) 25 MG 24 hr tablet Take 25 mg by mouth daily.     Yes Historical Provider, MD  omeprazole (PRILOSEC) 20 MG capsule Take 20 mg by mouth 2 (two) times daily.    Yes Historical  Provider, MD  simvastatin (ZOCOR) 20 MG tablet Take 20 mg by mouth at bedtime.     Yes Historical Provider, MD  enoxaparin (LOVENOX) 30 MG/0.3ML injection Inject 0.3 mLs (30 mg total) into the skin daily. Until 9/5 Patient not taking: Reported on 11/07/2015 10/07/14   [name], MD  HYDROcodone-acetaminophen (NORCO/VICODIN) 5-325 MG per tablet Take 1-2 tablets by mouth every 6 (six) hours as needed for moderate pain. Do not exceed 3gm of APAP/24 hours from all sources Patient not taking: Reported on 11/07/2015 10/16/14   Tiffany L Reed, DO  lidocaine (XYLOCAINE) 5 % ointment Apply 1 application topically 3 (three) times daily as needed. For back pain 09/04/14   Historical Provider, MD  ofloxacin (OCUFLOX) 0.3 % ophthalmic solution Apply 1 drop to eye 4 (four) times daily. *to start 3 days prior to operation (10/02/14-scheduled operation) and to continue for 1 week after the surgery 09/08/14   Historical Provider, MD  prednisoLONE acetate (PRED FORTE) 1 % ophthalmic suspension Apply 1 drop to eye 3 (three) times daily. *To  start following surgical procedure (10/02/14--scheduled surgery date) and continue 09/07/14   Historical Provider, MD    Physical Exam: Vitals:   04/24/16 0021 04/24/16 0023 04/24/16 0205 04/24/16 0207  BP: 184/70 148/70 167/70 178/62  Pulse: 89 90 81 83  Resp: 17 19 18 17   Temp: 99.1 F (37.3 C)     TempSrc: Oral     SpO2: 99% 99% 100% 99%  Weight:      Height:          Constitutional: NAD, calm, comfortable Vitals:   04/24/16 0021 04/24/16 0023 04/24/16 0205 04/24/16 0207  BP: 184/70 148/70 167/70 178/62  Pulse: 89 90 81 83  Resp: 17 19 18 17   Temp: 99.1 F (37.3 C)     TempSrc: Oral     SpO2: 99% 99% 100% 99%  Weight:      Height:       Eyes: PERRL, lids and conjunctivae normal ENMT: Mucous membranes are moist. Posterior pharynx clear of any exudate or lesions.Normal dentition.  Neck: normal, supple, no masses, no thyromegaly Respiratory: clear to  auscultation bilaterally, no wheezing, no crackles. Normal respiratory effort. No accessory muscle use.  Cardiovascular: Regular rate and rhythm, no murmurs / rubs / gallops. No extremity edema. 2+ pedal pulses. No carotid bruits.  Abdomen: no tenderness, no masses palpated. No hepatosplenomegaly. Bowel sounds positive.  Musculoskeletal: no clubbing / cyanosis. No joint deformity upper and lower extremities. Good ROM, no contractures. Normal muscle tone.  Skin: no rashes, lesions, ulcers. No induration Neurologic: CN 2-12 grossly intact. Sensation intact, DTR normal. Strength 5/5 in all 4.  Psychiatric: Normal judgment and insight. Alert and oriented x 3. Normal mood.   Labs on Admission: I have personally reviewed following labs and imaging studies  CBC:  Recent Labs Lab 04/24/16 0124  WBC 15.3*  NEUTROABS 13.6*  HGB 12.3  HCT 36.4  MCV 91.2  PLT 237   Basic Metabolic Panel:  Recent Labs Lab 04/24/16 0124  NA 142  K 4.5  CL 106  CO2 24  GLUCOSE 238*  BUN 33*  CREATININE 2.49*  CALCIUM 10.1   GFR: Estimated Creatinine Clearance: 17.1 mL/min (by C-G formula based on SCr of 2.49 mg/dL (H)). Liver Function Tests:  Recent Labs Lab 04/24/16 0124  AST 25  ALT 13*  ALKPHOS 101  BILITOT 0.6  PROT 8.3*  ALBUMIN 4.4   Coagulation Profile:  Recent Labs Lab 04/24/16 0124  INR 1.06   Urine analysis:    Component Value Date/Time   COLORURINE YELLOW 10/06/2014 1505   APPEARANCEUR HAZY (A) 10/06/2014 1505   LABSPEC 1.020 10/06/2014 1505   PHURINE 6.0 10/06/2014 1505   GLUCOSEU NEGATIVE 10/06/2014 1505   HGBUR MODERATE (A) 10/06/2014 1505   BILIRUBINUR NEGATIVE 10/06/2014 1505   KETONESUR NEGATIVE 10/06/2014 1505   PROTEINUR 30 (A) 10/06/2014 1505   UROBILINOGEN 0.2 10/06/2014 1505   NITRITE POSITIVE (A) 10/06/2014 1505   LEUKOCYTESUR SMALL (A) 10/06/2014 1505    Radiological Exams on Admission: Ct Abdomen Pelvis Wo Contrast  Result Date: 04/24/2016 CLINICAL  DATA:  Abdominal pain, unspecified location.  GI bleed. EXAM: CT ABDOMEN AND PELVIS WITHOUT CONTRAST TECHNIQUE: Multidetector CT imaging of the abdomen and pelvis was performed following the standard protocol without IV contrast. COMPARISON:  None. FINDINGS: Lower chest: Linear atelectasis in the left lower lobe. Slightly more nodular opacity at the confluence of atelectasis is likely secondary to scarring, no discrete pulmonary nodule coronary artery calcifications. Coarse calcification about the distal thoracic  aorta. Hepatobiliary: Calcified gallstones within the gallbladder. No pericholecystic inflammation. Gallbladder appears displaced inferiorly. No biliary dilatation. No focal hepatic lesion allowing for lack contrast. Pancreas: No ductal dilatation or inflammation. Spleen: Normal in size without focal abnormality. Adrenals/Urinary Tract: Thickening on the left adrenal gland without discrete nodule. Right adrenal gland is normal. No hydronephrosis or perinephric edema. Urinary bladder appears physiologically distended, partially obscured by streak artifact from left hip prosthesis. Stomach/Bowel: Thickening of the distal esophagus with small hiatal hernia. Small posterior gastric diverticulum just distal to the gastroesophageal junction. No small bowel dilatation or inflammation. Normal appendix. Moderate stool in the right colon. More distal colon is decompressed. There is mild colonic wall thickening involving the mid transverse and descending colon, questionably of sigmoid colon. Mild associated pericolonic edema involving the splenic flexure and proximal descending. Sigmoid colonic diverticulosis without acute inflammation. Vascular/Lymphatic: Aortic and branch atherosclerosis without aneurysm. No evidence of adenopathy. Reproductive: Uterus is atrophic, normal for age. No evidence of adnexal mass. Other: No free air or free fluid.  No abdominal wall hernia. Musculoskeletal: Degenerative change in the  lumbar spine. Left hip arthroplasty. The bones are diffusely under mineralized. IMPRESSION: 1. Mild colitis involving the distal transverse through descending colon. This may be infectious or inflammatory. Distribution is greater than would be expected for ischemic colitis. 2. Sigmoid diverticulosis without acute inflammation. 3. Cholelithiasis without gallbladder inflammation. 4. Aortic atherosclerosis. Electronically Signed   By: Jeb Levering M.D.   On: 04/24/2016 02:38    EKG: Independently reviewed.   Assessment/Plan Principal Problem:   Colitis, acute Active Problems:   Hypertension   Type 2 diabetes mellitus ([place])   Anemia   PLAN:   Acute colitis:  I suspect she may have infectious colitis.  She does have leukocytosis.  Will send stool for GI pathogens by PCR and C diff. Will place on enteric precaution.  Continue with IV Cipro and IV Flagyl.  Less likely is IBD or ischemic colitis.    CKD:  Stable.  Her Cr was 2.1 a year ago.  Will follow.  Continue with IVF.  HTN:  BP is elevated today.  Will continue her BB, hold Norvasc and diuretic for now with her bloody diarrhea.   Hypothyrodism:  Continue with supplement.  Will check TSH>    DVT prophylaxis: SCD.  Code Status: FULL CODE.  Family Communication: [place] daughter at bedside.  Disposition Plan: To home when appropriate.  Consults called: None.  Admission status: Inpatient as she has colitis and will require IV antiibiotic for a few days likely.    Kaycee Mcgaugh MD FACP. [facility]  If 7PM-7AM, please contact night-coverage [email or web address] Password [email or web address]  04/24/2016, 3:35 AM

## 2016-04-24 NOTE — Progress Notes (Signed)
04/24/2016 12:09 AM  04/24/2016 1:17 PM  Marie Hunt was seen and examined.  The H&P by the admitting provider , orders, imaging was reviewed.  Please see orders.  Will continue to follow.   [name], MD [facility]

## 2016-04-24 NOTE — Care Management Note (Signed)
Case Management Note  Patient Details  Name: Marie Hunt MRN: [id number] Date of Birth: [birth year]  Subjective/Objective:                  Pt admitted with colitis. Chart reviewed from home, lives alone, ind with ADL's. She has PCP, transportation to appointments and no difficulty affording medications. She plans to return home with self care.   Action/Plan: No CM needs anticipated.   Expected Discharge Date:      04/26/2016            Expected Discharge Plan:  Home/Self Care  In-House Referral:  NA  Discharge planning Services  CM Consult  Post Acute Care Choice:  NA Choice offered to:  NA  Status of Service:  Completed, signed off  Sherald Barge, RN 04/24/2016, 12:45 PM

## 2016-04-25 DIAGNOSIS — I10 Essential (primary) hypertension: Secondary | ICD-10-CM

## 2016-04-25 DIAGNOSIS — E1122 Type 2 diabetes mellitus with diabetic chronic kidney disease: Secondary | ICD-10-CM

## 2016-04-25 DIAGNOSIS — N183 Chronic kidney disease, stage 3 (moderate): Secondary | ICD-10-CM

## 2016-04-25 LAB — COMPREHENSIVE METABOLIC PANEL
ALBUMIN: 3.2 g/dL — AB (ref 3.5–5.0)
ALT: 8 U/L — ABNORMAL LOW (ref 14–54)
ANION GAP: 5 (ref 5–15)
AST: 18 U/L (ref 15–41)
Alkaline Phosphatase: 70 U/L (ref 38–126)
BUN: 17 mg/dL (ref 6–20)
CHLORIDE: 108 mmol/L (ref 101–111)
CO2: 25 mmol/L (ref 22–32)
Calcium: 8.7 mg/dL — ABNORMAL LOW (ref 8.9–10.3)
Creatinine, Ser: 1.77 mg/dL — ABNORMAL HIGH (ref 0.44–1.00)
GFR calc Af Amer: 30 mL/min — ABNORMAL LOW (ref >60)
GFR calc non Af Amer: 26 mL/min — ABNORMAL LOW (ref >60)
GLUCOSE: 96 mg/dL (ref 65–99)
POTASSIUM: 4.1 mmol/L (ref 3.5–5.1)
SODIUM: 138 mmol/L (ref 135–145)
Total Bilirubin: 0.5 mg/dL (ref 0.3–1.2)
Total Protein: 6.2 g/dL — ABNORMAL LOW (ref 6.5–8.1)

## 2016-04-25 LAB — CBC
HCT: 31.6 % — ABNORMAL LOW (ref 36.0–46.0)
Hemoglobin: 10.4 g/dL — ABNORMAL LOW (ref 12.0–15.0)
MCH: 30.5 pg (ref 26.0–34.0)
MCHC: 32.9 g/dL (ref 30.0–36.0)
MCV: 92.7 fL (ref 78.0–100.0)
PLATELETS: 142 10*3/uL — AB (ref 150–400)
RBC: 3.41 MIL/uL — ABNORMAL LOW (ref 3.87–5.11)
RDW: 12.5 % (ref 11.5–15.5)
WBC: 10.5 10*3/uL (ref 4.0–10.5)

## 2016-04-25 LAB — GLUCOSE, CAPILLARY
GLUCOSE-CAPILLARY: 86 mg/dL (ref 65–99)
GLUCOSE-CAPILLARY: 91 mg/dL (ref 65–99)
GLUCOSE-CAPILLARY: 125 mg/dL — AB (ref 65–99)
Glucose-Capillary: 102 mg/dL — ABNORMAL HIGH (ref 65–99)

## 2016-04-25 MED ORDER — CIPROFLOXACIN HCL 250 MG PO TABS
250.0000 mg | ORAL_TABLET | Freq: Two times a day (BID) | ORAL | Status: DC
  Administered 2016-04-25 – 2016-04-26 (×3): 250 mg via ORAL
  Filled 2016-04-25 (×3): qty 1

## 2016-04-25 MED ORDER — METRONIDAZOLE 500 MG PO TABS
500.0000 mg | ORAL_TABLET | Freq: Three times a day (TID) | ORAL | Status: DC
  Administered 2016-04-25 – 2016-04-26 (×3): 500 mg via ORAL
  Filled 2016-04-25 (×3): qty 1

## 2016-04-25 MED ORDER — AMLODIPINE BESYLATE 5 MG PO TABS
10.0000 mg | ORAL_TABLET | Freq: Every day | ORAL | Status: DC
  Administered 2016-04-25 – 2016-04-26 (×2): 10 mg via ORAL
  Filled 2016-04-25 (×2): qty 2

## 2016-04-25 NOTE — Evaluation (Signed)
Physical Therapy Evaluation Patient Details Name: Marie Hunt MRN: [id number] DOB: [birth year] Today's Date: 04/25/2016   History of Present Illness  83 y.o. female with hx of anemia, CAD, DM, hx of cholelithiasis, hx of CKD with baseline Cr 2.1, lives alone, presented to the ER with 2 days hx of watery diarrhea followed by bloody diarrhea.  She has abdominal cramps.  No known contact, distant travel or recent anbitiotics. She had colonoscopy from a physcian in [place] about 2 years ago, according to her, and had diverticular disease and a polyp removed.  Evaluation in the ER showed stable hemodynamics, Hb of 12g/dL, WBC of 15K,  Cr of 2.46, K of 4.5, and BS of 238.  An abdominal pelvic CT showed distant transverse and descending colon colitis.  Hospitalist was asked to admit her for further eval and Tx.     Clinical Impression  Pt received in bed, grandson present, but left for PT evaluation.  Pt is agreeable to PT evaluation.  She is normally independent with ambulation, dressing, bathing, driving.  She also is still working at a day care.  During today's PT evaluation she was independent with all functional mobility, including gait x 243ft with no device.  She does not demonstrate need for continued acute PT or any follow up PT after discharge.  Will discontinue PT order at this time.      Follow Up Recommendations No PT follow up    Equipment Recommendations  None recommended by PT    Recommendations for Other Services       Precautions / Restrictions Precautions Precautions: None Restrictions Weight Bearing Restrictions: No      Mobility  Bed Mobility Overal bed mobility: Independent                Transfers Overall transfer level: Independent                  Ambulation/Gait Ambulation/Gait assistance: Independent Ambulation Distance (Feet): 200 Feet Assistive device: None Gait Pattern/deviations: WFL(Within Functional Limits)   Gait velocity  interpretation: at or above normal speed for age/gender    Stairs            Wheelchair Mobility    Modified Rankin (Stroke Patients Only)       Balance Overall balance assessment: Independent                                           Pertinent Vitals/Pain Pain Assessment: No/denies pain    Home Living   Living Arrangements: Alone Available Help at Discharge:  (granddaughter - comes every day - stays a few hours) Type of Home: Apartment Home Access: Level entry     Home Layout: One level Home Equipment: Grab bars - toilet;Grab bars - tub/shower;Shower seat;Cane - single point      Prior Function     Gait / Transfers Assistance Needed: Pt is independent with ambulation, does her own grocery shopping.    ADL's / Homemaking Assistance Needed: Pt does all her own cooking and cleaning.   Comments: Pt is still driving     Hand Dominance   Dominant Hand: Right    Extremity/Trunk Assessment   Upper Extremity Assessment Upper Extremity Assessment: Overall WFL for tasks assessed    Lower Extremity Assessment Lower Extremity Assessment: Overall WFL for tasks assessed       Communication   Communication:  No difficulties  Cognition Arousal/Alertness: Awake/alert Behavior During Therapy: WFL for tasks assessed/performed Overall Cognitive Status: Within Functional Limits for tasks assessed                      General Comments      Exercises     Assessment/Plan    PT Assessment Patent does not need any further PT services  PT Problem List         PT Treatment Interventions      PT Goals (Current goals can be found in the Care Plan section)  Acute Rehab PT Goals Patient Stated Goal: To go home PT Goal Formulation: All assessment and education complete, DC therapy    Frequency     Barriers to discharge        Co-evaluation               End of Session Equipment Utilized During Treatment: Gait  belt Activity Tolerance: Patient tolerated treatment well Patient left: in chair;with call bell/phone within reach Nurse Communication: Mobility status (mobiltiy sheet left in pt's room.) PT Visit Diagnosis: Muscle weakness (generalized) (M62.81)    Functional Assessment Tool Used: AM-PAC 6 Clicks Basic Mobility;Clinical judgement Functional Limitation: Mobility: Walking and moving around Mobility: Walking and Moving Around Current Status [phone]): 0 percent impaired, limited or restricted Mobility: Walking and Moving Around Goal Status [phone]): 0 percent impaired, limited or restricted Mobility: Walking and Moving Around Discharge Status [phone]): 0 percent impaired, limited or restricted    Time: 4585-9292 PT Time Calculation (min) (ACUTE ONLY): 16 min   Charges:   PT Evaluation $PT Eval Low Complexity: 1 Procedure     PT G Codes:   PT G-Codes **NOT FOR INPATIENT CLASS** Functional Assessment Tool Used: AM-PAC 6 Clicks Basic Mobility;Clinical judgement Functional Limitation: Mobility: Walking and moving around Mobility: Walking and Moving Around Current Status (K4628): 0 percent impaired, limited or restricted Mobility: Walking and Moving Around Goal Status (M3817): 0 percent impaired, limited or restricted Mobility: Walking and Moving Around Discharge Status (R1165): 0 percent impaired, limited or restricted    Beth Jory Welke, PT, DPT X: [id number]

## 2016-04-25 NOTE — Progress Notes (Addendum)
PROGRESS NOTE    Marie Hunt  VCB:[id number]  DOB: Mar 05, 1932  DOA: 04/24/2016 PCP: Monico Blitz, MD Outpatient Specialists:   Hospital course: Marie Hunt is an 83 y.o. female with hx of anemia, CAD, DM, hx of cholelithiasis, hx of CKD with baseline Cr 2.1, lives alone, presented to the ER with 2 days hx of watery diarrhea followed by bloody diarrhea.  She has abdominal cramps.  No known contact, distant travel or recent anbitiotics. She had colonoscopy from a physcian in [place] about 2 years ago, according to her, and had diverticular disease and a polyp removed.  Evaluation in the ER showed stable hemodynamics, Hb of 12g/dL, WBC of 15K,  Cr of 2.46, K of 4.5, and BS of 238.  An abdominal pelvic CT showed distant transverse and descending colon colitis.  Hospitalist was asked to admit her for further eval and Tx.   Assessment & Plan:   Acute colitis:  I suspect she may have infectious colitis.  She does have leukocytosis which is much improved.  Will send stool for GI pathogens by PCR and C diff. Will place on enteric precaution.  Continue with Cipro and Flagyl.  Advance diet today.  If tolerates can go home tomorrow morning.     CKD stage 3:  Stable.  Her Cr was 2.1 a year ago.  Improved with IVF.  HTN:  BP is improved.  Will continue her BB, resume Norvasc.   Hypothyrodism:  Continue with supplement.    DVT prophylaxis: SCD.  Code Status: FULL CODE.  Family Communication: [place] daughter at bedside.  Disposition Plan: PT consult, Plan to DC 3/3 in AM  Consults called: None.   Subjective: Pt says that diarrhea has slowed up considerably.   She wants to try advancing diet.   Objective: Vitals:   04/24/16 0443 04/24/16 1403 04/24/16 2301 04/25/16 0613  BP: 149/92 (!) 146/49 (!) 143/52 (!) 147/53  Pulse: 91 67 88 60  Resp:  18 18 18   Temp: 98.4 F (36.9 C) 98.9 F (37.2 C) 99.6 F (37.6 C) 99.3 F (37.4 C)  TempSrc: Oral  Oral Oral  SpO2: 100% 99% 96% 97%    Weight:      Height:        Intake/Output Summary (Last 24 hours) at 04/25/16 1016 Last data filed at 04/25/16 0339  Gross per 24 hour  Intake          1798.75 ml  Output                0 ml  Net          1798.75 ml   Filed Weights   04/24/16 0013  Weight: 70.8 kg (156 lb)    Exam:  General exam: awake, alert, NAD.  Respiratory system: Clear. No increased work of breathing. Cardiovascular system: S1 & S2 heard. No JVD, murmurs, gallops, clicks or pedal edema. Gastrointestinal system: Abdomen is nondistended, soft and nontender. Normal bowel sounds heard. Central nervous system: Alert and oriented. No focal neurological deficits. Extremities: no CCE.  Data Reviewed: Basic Metabolic Panel:  Recent Labs Lab 04/24/16 0124 04/24/16 0537 04/25/16 0823  NA 142 140 138  K 4.5 3.9 4.1  CL 106 105 108  CO2 24 23 25   GLUCOSE 238* 174* 96  BUN 33* 30* 17  CREATININE 2.49* 2.19* 1.77*  CALCIUM 10.1 9.4 8.7*   Liver Function Tests:  Recent Labs Lab 04/24/16 0124 04/25/16 0823  AST 25 18  ALT 13*  8*  ALKPHOS 101 70  BILITOT 0.6 0.5  PROT 8.3* 6.2*  ALBUMIN 4.4 3.2*   No results for input(s): LIPASE, AMYLASE in the last 168 hours. No results for input(s): AMMONIA in the last 168 hours. CBC:  Recent Labs Lab 04/24/16 0124 04/24/16 0537 04/25/16 0823  WBC 15.3* 14.2* 10.5  NEUTROABS 13.6*  --   --   HGB 12.3 12.4 10.4*  HCT 36.4 37.1 31.6*  MCV 91.2 91.4 92.7  PLT 192 176 142*   Cardiac Enzymes: No results for input(s): CKTOTAL, CKMB, CKMBINDEX, TROPONINI in the last 168 hours. CBG (last 3)   Recent Labs  04/24/16 1642 04/24/16 2302 04/25/16 0737  GLUCAP 66 100* 86   Recent Results (from the past 240 hour(s))  Gastrointestinal Panel by PCR , Stool     Status: None   Collection Time: 04/24/16  3:34 AM  Result Value Ref Range Status   Campylobacter species NOT DETECTED NOT DETECTED Final   Plesimonas shigelloides NOT DETECTED NOT DETECTED Final    Salmonella species NOT DETECTED NOT DETECTED Final   Yersinia enterocolitica NOT DETECTED NOT DETECTED Final   Vibrio species NOT DETECTED NOT DETECTED Final   Vibrio cholerae NOT DETECTED NOT DETECTED Final   Enteroaggregative E coli (EAEC) NOT DETECTED NOT DETECTED Final   Enteropathogenic E coli (EPEC) NOT DETECTED NOT DETECTED Final   Enterotoxigenic E coli (ETEC) NOT DETECTED NOT DETECTED Final   Shiga like toxin producing E coli (STEC) NOT DETECTED NOT DETECTED Final   Shigella/Enteroinvasive E coli (EIEC) NOT DETECTED NOT DETECTED Final   Cryptosporidium NOT DETECTED NOT DETECTED Final   Cyclospora cayetanensis NOT DETECTED NOT DETECTED Final   Entamoeba histolytica NOT DETECTED NOT DETECTED Final   Giardia lamblia NOT DETECTED NOT DETECTED Final   Adenovirus F40/41 NOT DETECTED NOT DETECTED Final   Astrovirus NOT DETECTED NOT DETECTED Final   Norovirus GI/GII NOT DETECTED NOT DETECTED Final   Rotavirus A NOT DETECTED NOT DETECTED Final   Sapovirus (I, II, IV, and V) NOT DETECTED NOT DETECTED Final  C difficile quick scan w PCR reflex     Status: None   Collection Time: 04/24/16  3:34 AM  Result Value Ref Range Status   C Diff antigen NEGATIVE NEGATIVE Final   C Diff toxin NEGATIVE NEGATIVE Final   C Diff interpretation No C. difficile detected.  Final     Studies: Ct Abdomen Pelvis Wo Contrast  Result Date: 04/24/2016 CLINICAL DATA:  Abdominal pain, unspecified location.  GI bleed. EXAM: CT ABDOMEN AND PELVIS WITHOUT CONTRAST TECHNIQUE: Multidetector CT imaging of the abdomen and pelvis was performed following the standard protocol without IV contrast. COMPARISON:  None. FINDINGS: Lower chest: Linear atelectasis in the left lower lobe. Slightly more nodular opacity at the confluence of atelectasis is likely secondary to scarring, no discrete pulmonary nodule coronary artery calcifications. Coarse calcification about the distal thoracic aorta. Hepatobiliary: Calcified gallstones  within the gallbladder. No pericholecystic inflammation. Gallbladder appears displaced inferiorly. No biliary dilatation. No focal hepatic lesion allowing for lack contrast. Pancreas: No ductal dilatation or inflammation. Spleen: Normal in size without focal abnormality. Adrenals/Urinary Tract: Thickening on the left adrenal gland without discrete nodule. Right adrenal gland is normal. No hydronephrosis or perinephric edema. Urinary bladder appears physiologically distended, partially obscured by streak artifact from left hip prosthesis. Stomach/Bowel: Thickening of the distal esophagus with small hiatal hernia. Small posterior gastric diverticulum just distal to the gastroesophageal junction. No small bowel dilatation or inflammation. Normal appendix. Moderate  stool in the right colon. More distal colon is decompressed. There is mild colonic wall thickening involving the mid transverse and descending colon, questionably of sigmoid colon. Mild associated pericolonic edema involving the splenic flexure and proximal descending. Sigmoid colonic diverticulosis without acute inflammation. Vascular/Lymphatic: Aortic and branch atherosclerosis without aneurysm. No evidence of adenopathy. Reproductive: Uterus is atrophic, normal for age. No evidence of adnexal mass. Other: No free air or free fluid.  No abdominal wall hernia. Musculoskeletal: Degenerative change in the lumbar spine. Left hip arthroplasty. The bones are diffusely under mineralized. IMPRESSION: 1. Mild colitis involving the distal transverse through descending colon. This may be infectious or inflammatory. Distribution is greater than would be expected for ischemic colitis. 2. Sigmoid diverticulosis without acute inflammation. 3. Cholelithiasis without gallbladder inflammation. 4. Aortic atherosclerosis. Electronically Signed   By: Jeb Levering M.D.   On: 04/24/2016 02:38     Scheduled Meds: . aspirin EC  81 mg Oral Daily  . ciprofloxacin  250 mg  Oral BID  . insulin aspart  0-9 Units Subcutaneous TID WC  . levothyroxine  25 mcg Oral QAC breakfast  . metoprolol succinate  25 mg Oral Daily  . metroNIDAZOLE  500 mg Oral Q8H  . pantoprazole  40 mg Oral Daily  . simvastatin  20 mg Oral QHS  . sodium chloride flush  3 mL Intravenous Q12H   Continuous Infusions: . sodium chloride 60 mL/hr at 04/25/16 1610    Principal Problem:   Colitis, acute Active Problems:   Hypertension   Type 2 diabetes mellitus (HCC)   Anemia   Time spent:   Irwin Brakeman, MD, FAAFP [facility] Pager [phone]  If 7PM-7AM, please contact night-coverage [email or web address] Password [email or web address] 04/25/2016, 10:16 AM    LOS: 1 day

## 2016-04-25 NOTE — Care Management Important Message (Signed)
Important Message  Patient Details  Name: Marie Hunt MRN: [id number] Date of Birth: April 04, 1932   Medicare Important Message Given:  Yes    Sherald Barge, RN 04/25/2016, 9:37 AM

## 2016-04-26 DIAGNOSIS — D649 Anemia, unspecified: Secondary | ICD-10-CM

## 2016-04-26 LAB — GLUCOSE, CAPILLARY
GLUCOSE-CAPILLARY: 95 mg/dL (ref 65–99)
GLUCOSE-CAPILLARY: 115 mg/dL — AB (ref 65–99)

## 2016-04-26 MED ORDER — CIPROFLOXACIN HCL 250 MG PO TABS: 250.0000 mg | ORAL_TABLET | Freq: Two times a day (BID) | ORAL | 0 refills | Status: AC

## 2016-04-26 MED ORDER — METRONIDAZOLE 500 MG PO TABS: 500.0000 mg | ORAL_TABLET | Freq: Three times a day (TID) | ORAL | 0 refills | Status: AC

## 2016-04-26 NOTE — Progress Notes (Signed)
Patient with orders to be discharge home. Discharge instructions given, patient verbalized understanding. Patient stable. Patient left in private vehicle with family.  

## 2016-04-26 NOTE — Discharge Instructions (Signed)
Please hold your aspirin for next week until you follow up with Dr. Manuella Ghazi for recheck.   Gastrointestinal Bleeding Gastrointestinal bleeding is bleeding somewhere along the path food travels through the body (digestive tract). This path is anywhere between the mouth and the opening of the butt (anus). You may have blood in your poop (stools) or have black poop. If you throw up (vomit), there may be blood in it. This condition can be mild, serious, or even life-threatening. If you have a lot of bleeding, you may need to stay in the hospital. Follow these instructions at home:  Take over-the-counter and prescription medicines only as told by your doctor.  Eat foods that have a lot of fiber in them. These foods include whole grains, fruits, and vegetables. You can also try eating 1-3 prunes each day.  Drink enough fluid to keep your pee (urine) clear or pale yellow.  Keep all follow-up visits as told by your doctor. This is important. Contact a doctor if:  Your symptoms do not get better. Get help right away if:  Your bleeding gets worse.  You feel dizzy or you pass out (faint).  You feel weak.  You have very bad cramps in your back or belly (abdomen).  You pass large clumps of blood (clots) in your poop.  Your symptoms are getting worse. This information is not intended to replace advice given to you by your health care provider. Make sure you discuss any questions you have with your health care provider. Document Released: 11/20/2007 Document Revised: 07/19/2015 Document Reviewed: 07/31/2014 Elsevier Interactive Patient Education  2017 [employer].

## 2016-04-26 NOTE — Discharge Summary (Signed)
Physician Discharge Summary  MCKENLEE MANGHAM XTG:[id number] DOB: 12/04/1932 DOA: 04/24/2016  PCP: Monico Blitz, MD  Admit date: 04/24/2016 Discharge date: 04/26/2016  Admitted From: Home  Disposition:  Home  Recommendations for Outpatient Follow-up:  1. Follow up with PCP in 1 weeks 2. Please obtain BMP/CBC in one week 3. Please refer patient to GI for outpatient workup.    Discharge Condition: STABLE  CODE STATUS: FULL  Diet recommendation: soft   Brief/Interim Summary: HPI: Marie Hunt is an 83 y.o. female with hx of anemia, CAD, DM, hx of cholelithiasis, hx of CKD with baseline Cr 2.1, lives alone, presented to the ER with 2 days hx of watery diarrhea followed by bloody diarrhea.  She has abdominal cramps.  No known contact, distant travel or recent anbitiotics. She had colonoscopy from a physcian in [place] about 2 years ago, according to her, and had diverticular disease and a polyp removed.  Evaluation in the ER showed stable hemodynamics, Hb of 12g/dL, WBC of 15K,  Cr of 2.46, K of 4.5, and BS of 238.  An abdominal pelvic CT showed distant transverse and descending colon colitis.  Hospitalist was asked to admit her for further eval and Tx.   Assessment & Plan:   Acute colitis: I suspect she has infectious colitis. She does have leukocytosis which is much improved. GI pathogens by PCR and C diff negative.  She has responded well to cipro and flagyl.  She tolerated them p.o. Yesterday and will discharge home with 7 more days of therapy and recommended she do soft diet for next week.  Recommend she have outpatient GI referral for outpatient workup and evaluation. She has tolerated the advanced diet.   Recommended holding aspirin until she follows up with PCP for recheck.     CKD stage 3: Stable. Her Cr was 2.1 a year ago. Improved with IVF.  HTN: BP is improved. Will continue her BB, resume Norvasc.   Hypothyrodism: Continue with supplement.   DVT prophylaxis: SCD.   Code Status:FULL CODE.  Family Communication:[place] daughter by telephone Disposition Plan:PT consult no recommendations  Plan to DC home  3/3 in AM   Discharge Diagnoses:  Principal Problem:   Colitis, acute Active Problems:   Hypertension   Type 2 diabetes mellitus ([place])   Anemia  Discharge Instructions  Discharge Instructions    Increase activity slowly    Complete by:  As directed      Allergies as of 04/26/2016      Reactions   Codeine Nausea And Vomiting      Medication List    STOP taking these medications   aspirin 81 MG tablet     TAKE these medications   amLODipine 10 MG tablet Commonly known as:  NORVASC Take 1 tablet (10 mg total) by mouth daily.   B-COMPLEX/B-12 PO Take 1,000 mg by mouth daily.   calcium-vitamin D 500-200 MG-UNIT tablet Commonly known as:  OSCAL WITH D Take 1 tablet by mouth daily.   ciprofloxacin 250 MG tablet Commonly known as:  CIPRO Take 1 tablet (250 mg total) by mouth 2 (two) times daily.   ferrous sulfate 325 (65 FE) MG tablet Take 325 mg by mouth daily with breakfast.   folic acid 1 MG tablet Commonly known as:  FOLVITE Take 1 mg by mouth daily.   furosemide 40 MG tablet Commonly known as:  LASIX Take 1 tablet (40 mg total) by mouth daily as needed for fluid. What changed:  when to take  this   glimepiride 2 MG tablet Commonly known as:  AMARYL Take 2 mg by mouth daily before breakfast.   ketorolac 0.5 % ophthalmic solution Commonly known as:  ACULAR Apply 1 drop to eye 2 (two) times daily. To start 3 days prior to operation (10/02/14-scheduled date for operation), then continue as directed   levothyroxine 25 MCG tablet Commonly known as:  SYNTHROID, LEVOTHROID Take 25 mcg by mouth daily before breakfast.   lisinopril 20 MG tablet Commonly known as:  PRINIVIL,ZESTRIL Take 20 mg by mouth daily.   metoprolol succinate 25 MG 24 hr tablet Commonly known as:  TOPROL-XL Take 25 mg by mouth daily.    metroNIDAZOLE 500 MG tablet Commonly known as:  FLAGYL Take 1 tablet (500 mg total) by mouth every 8 (eight) hours.   omeprazole 20 MG capsule Commonly known as:  PRILOSEC Take 20 mg by mouth 2 (two) times daily.   PAZEO 0.7 % Soln Generic drug:  Olopatadine HCl Place 1 drop into both eyes daily.   simvastatin 20 MG tablet Commonly known as:  ZOCOR Take 20 mg by mouth at bedtime.      Follow-up Information    SHAH,ASHISH, MD. Schedule an appointment as soon as possible for a visit in 1 week(s).   Specialty:  Internal Medicine Why:  Hospital Follow up  Contact information: [address] [place] [address] [phone]          Allergies  Allergen Reactions  . Codeine Nausea And Vomiting    Procedures/Studies: Ct Abdomen Pelvis Wo Contrast  Result Date: 04/24/2016 CLINICAL DATA:  Abdominal pain, unspecified location.  GI bleed. EXAM: CT ABDOMEN AND PELVIS WITHOUT CONTRAST TECHNIQUE: Multidetector CT imaging of the abdomen and pelvis was performed following the standard protocol without IV contrast. COMPARISON:  None. FINDINGS: Lower chest: Linear atelectasis in the left lower lobe. Slightly more nodular opacity at the confluence of atelectasis is likely secondary to scarring, no discrete pulmonary nodule coronary artery calcifications. Coarse calcification about the distal thoracic aorta. Hepatobiliary: Calcified gallstones within the gallbladder. No pericholecystic inflammation. Gallbladder appears displaced inferiorly. No biliary dilatation. No focal hepatic lesion allowing for lack contrast. Pancreas: No ductal dilatation or inflammation. Spleen: Normal in size without focal abnormality. Adrenals/Urinary Tract: Thickening on the left adrenal gland without discrete nodule. Right adrenal gland is normal. No hydronephrosis or perinephric edema. Urinary bladder appears physiologically distended, partially obscured by streak artifact from left hip prosthesis. Stomach/Bowel:  Thickening of the distal esophagus with small hiatal hernia. Small posterior gastric diverticulum just distal to the gastroesophageal junction. No small bowel dilatation or inflammation. Normal appendix. Moderate stool in the right colon. More distal colon is decompressed. There is mild colonic wall thickening involving the mid transverse and descending colon, questionably of sigmoid colon. Mild associated pericolonic edema involving the splenic flexure and proximal descending. Sigmoid colonic diverticulosis without acute inflammation. Vascular/Lymphatic: Aortic and branch atherosclerosis without aneurysm. No evidence of adenopathy. Reproductive: Uterus is atrophic, normal for age. No evidence of adnexal mass. Other: No free air or free fluid.  No abdominal wall hernia. Musculoskeletal: Degenerative change in the lumbar spine. Left hip arthroplasty. The bones are diffusely under mineralized. IMPRESSION: 1. Mild colitis involving the distal transverse through descending colon. This may be infectious or inflammatory. Distribution is greater than would be expected for ischemic colitis. 2. Sigmoid diverticulosis without acute inflammation. 3. Cholelithiasis without gallbladder inflammation. 4. Aortic atherosclerosis. Electronically Signed   By: Jeb Levering M.D.   On: 04/24/2016 02:38  Subjective: Pt says she is feeling better.  She is eating and drinking and ambulating.  No bloody stools.    Discharge Exam: Vitals:   04/25/16 2156 04/26/16 0604  BP: (!) 147/47 (!) 156/43  Pulse: 63 76  Resp: 15 19  Temp: 99.4 F (37.4 C) 99.1 F (37.3 C)   Vitals:   04/25/16 0613 04/25/16 1700 04/25/16 2156 04/26/16 0604  BP: (!) 147/53 (!) 142/45 (!) 147/47 (!) 156/43  Pulse: 60 63 63 76  Resp: 18 18 15 19   Temp: 99.3 F (37.4 C) 99.1 F (37.3 C) 99.4 F (37.4 C) 99.1 F (37.3 C)  TempSrc: Oral Oral Oral Oral  SpO2: 97% 98% 97% 98%  Weight:      Height:        General: Pt is alert, awake, not in  acute distress Cardiovascular: RRR, S1/S2 +, no rubs, no gallops Respiratory: CTA bilaterally, no wheezing, no rhonchi Abdominal: Soft, NT, ND, bowel sounds + Extremities: no edema, no cyanosis  The results of significant diagnostics from this hospitalization (including imaging, microbiology, ancillary and laboratory) are listed below for reference.     Microbiology: Recent Results (from the past 240 hour(s))  Gastrointestinal Panel by PCR , Stool     Status: None   Collection Time: 04/24/16  3:34 AM  Result Value Ref Range Status   Campylobacter species NOT DETECTED NOT DETECTED Final   Plesimonas shigelloides NOT DETECTED NOT DETECTED Final   Salmonella species NOT DETECTED NOT DETECTED Final   Yersinia enterocolitica NOT DETECTED NOT DETECTED Final   Vibrio species NOT DETECTED NOT DETECTED Final   Vibrio cholerae NOT DETECTED NOT DETECTED Final   Enteroaggregative E coli (EAEC) NOT DETECTED NOT DETECTED Final   Enteropathogenic E coli (EPEC) NOT DETECTED NOT DETECTED Final   Enterotoxigenic E coli (ETEC) NOT DETECTED NOT DETECTED Final   Shiga like toxin producing E coli (STEC) NOT DETECTED NOT DETECTED Final   Shigella/Enteroinvasive E coli (EIEC) NOT DETECTED NOT DETECTED Final   Cryptosporidium NOT DETECTED NOT DETECTED Final   Cyclospora cayetanensis NOT DETECTED NOT DETECTED Final   Entamoeba histolytica NOT DETECTED NOT DETECTED Final   Giardia lamblia NOT DETECTED NOT DETECTED Final   Adenovirus F40/41 NOT DETECTED NOT DETECTED Final   Astrovirus NOT DETECTED NOT DETECTED Final   Norovirus GI/GII NOT DETECTED NOT DETECTED Final   Rotavirus A NOT DETECTED NOT DETECTED Final   Sapovirus (I, II, IV, and V) NOT DETECTED NOT DETECTED Final  C difficile quick scan w PCR reflex     Status: None   Collection Time: 04/24/16  3:34 AM  Result Value Ref Range Status   C Diff antigen NEGATIVE NEGATIVE Final   C Diff toxin NEGATIVE NEGATIVE Final   C Diff interpretation No C.  difficile detected.  Final     Labs: BNP (last 3 results) No results for input(s): BNP in the last 8760 hours. Basic Metabolic Panel:  Recent Labs Lab 04/24/16 0124 04/24/16 0537 04/25/16 0823  NA 142 140 138  K 4.5 3.9 4.1  CL 106 105 108  CO2 24 23 25   GLUCOSE 238* 174* 96  BUN 33* 30* 17  CREATININE 2.49* 2.19* 1.77*  CALCIUM 10.1 9.4 8.7*   Liver Function Tests:  Recent Labs Lab 04/24/16 0124 04/25/16 0823  AST 25 18  ALT 13* 8*  ALKPHOS 101 70  BILITOT 0.6 0.5  PROT 8.3* 6.2*  ALBUMIN 4.4 3.2*   No results for input(s): LIPASE, AMYLASE in  the last 168 hours. No results for input(s): AMMONIA in the last 168 hours. CBC:  Recent Labs Lab 04/24/16 0124 04/24/16 0537 04/25/16 0823  WBC 15.3* 14.2* 10.5  NEUTROABS 13.6*  --   --   HGB 12.3 12.4 10.4*  HCT 36.4 37.1 31.6*  MCV 91.2 91.4 92.7  PLT 192 176 142*   Cardiac Enzymes: No results for input(s): CKTOTAL, CKMB, CKMBINDEX, TROPONINI in the last 168 hours. BNP: Invalid input(s): POCBNP CBG:  Recent Labs Lab 04/25/16 0737 04/25/16 1123 04/25/16 1636 04/25/16 2052 04/26/16 0731  GLUCAP 86 91 102* 125* 95   D-Dimer No results for input(s): DDIMER in the last 72 hours. Hgb A1c No results for input(s): HGBA1C in the last 72 hours. Lipid Profile No results for input(s): CHOL, HDL, LDLCALC, TRIG, CHOLHDL, LDLDIRECT in the last 72 hours. Thyroid function studies No results for input(s): TSH, T4TOTAL, T3FREE, THYROIDAB in the last 72 hours.  Invalid input(s): FREET3 Anemia work up No results for input(s): VITAMINB12, FOLATE, FERRITIN, TIBC, IRON, RETICCTPCT in the last 72 hours. Urinalysis    Component Value Date/Time   COLORURINE YELLOW 10/06/2014 1505   APPEARANCEUR HAZY (A) 10/06/2014 1505   LABSPEC 1.020 10/06/2014 1505   PHURINE 6.0 10/06/2014 1505   GLUCOSEU NEGATIVE 10/06/2014 1505   HGBUR MODERATE (A) 10/06/2014 1505   BILIRUBINUR NEGATIVE 10/06/2014 1505   KETONESUR NEGATIVE  10/06/2014 1505   PROTEINUR 30 (A) 10/06/2014 1505   UROBILINOGEN 0.2 10/06/2014 1505   NITRITE POSITIVE (A) 10/06/2014 1505   LEUKOCYTESUR SMALL (A) 10/06/2014 1505   Sepsis Labs Invalid input(s): PROCALCITONIN,  WBC,  LACTICIDVEN Microbiology Recent Results (from the past 240 hour(s))  Gastrointestinal Panel by PCR , Stool     Status: None   Collection Time: 04/24/16  3:34 AM  Result Value Ref Range Status   Campylobacter species NOT DETECTED NOT DETECTED Final   Plesimonas shigelloides NOT DETECTED NOT DETECTED Final   Salmonella species NOT DETECTED NOT DETECTED Final   Yersinia enterocolitica NOT DETECTED NOT DETECTED Final   Vibrio species NOT DETECTED NOT DETECTED Final   Vibrio cholerae NOT DETECTED NOT DETECTED Final   Enteroaggregative E coli (EAEC) NOT DETECTED NOT DETECTED Final   Enteropathogenic E coli (EPEC) NOT DETECTED NOT DETECTED Final   Enterotoxigenic E coli (ETEC) NOT DETECTED NOT DETECTED Final   Shiga like toxin producing E coli (STEC) NOT DETECTED NOT DETECTED Final   Shigella/Enteroinvasive E coli (EIEC) NOT DETECTED NOT DETECTED Final   Cryptosporidium NOT DETECTED NOT DETECTED Final   Cyclospora cayetanensis NOT DETECTED NOT DETECTED Final   Entamoeba histolytica NOT DETECTED NOT DETECTED Final   Giardia lamblia NOT DETECTED NOT DETECTED Final   Adenovirus F40/41 NOT DETECTED NOT DETECTED Final   Astrovirus NOT DETECTED NOT DETECTED Final   Norovirus GI/GII NOT DETECTED NOT DETECTED Final   Rotavirus A NOT DETECTED NOT DETECTED Final   Sapovirus (I, II, IV, and V) NOT DETECTED NOT DETECTED Final  C difficile quick scan w PCR reflex     Status: None   Collection Time: 04/24/16  3:34 AM  Result Value Ref Range Status   C Diff antigen NEGATIVE NEGATIVE Final   C Diff toxin NEGATIVE NEGATIVE Final   C Diff interpretation No C. difficile detected.  Final   Time coordinating discharge: 33 minutes  SIGNED:  Irwin Brakeman, MD  [facility] 04/26/2016, 10:02 AM Pager   If 7PM-7AM, please contact night-coverage [email or web address] Password [email or web address]

## 2016-10-24 IMAGING — DX DG CHEST 1V
1 series · 2 of 2 positions shown · non-contrast
Comparison: PA and lateral chest 08/24/2009.

CLINICAL DATA: Status post slip and fall today.

EXAM:
CHEST  1 VIEW

[Series 1: chest ap · 0.14mm/px · 2 of 2 slices shown]
[im 1/2]
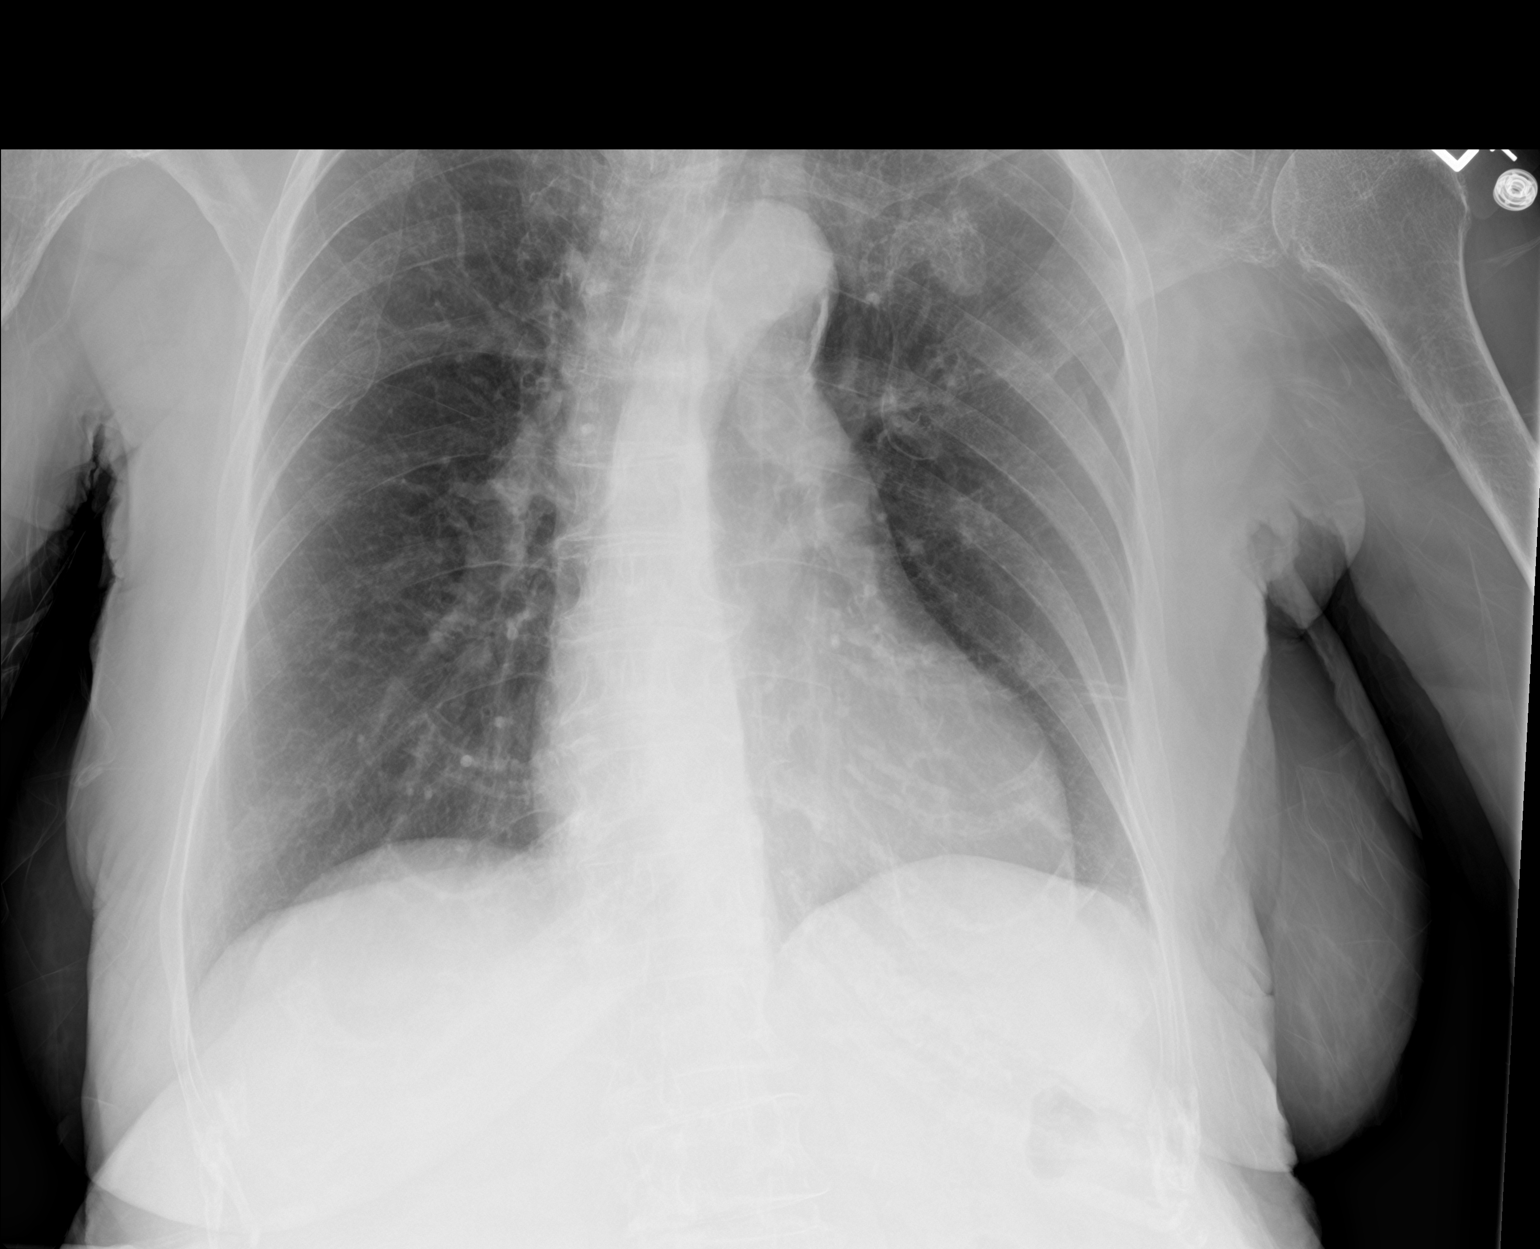
[im 2/2]
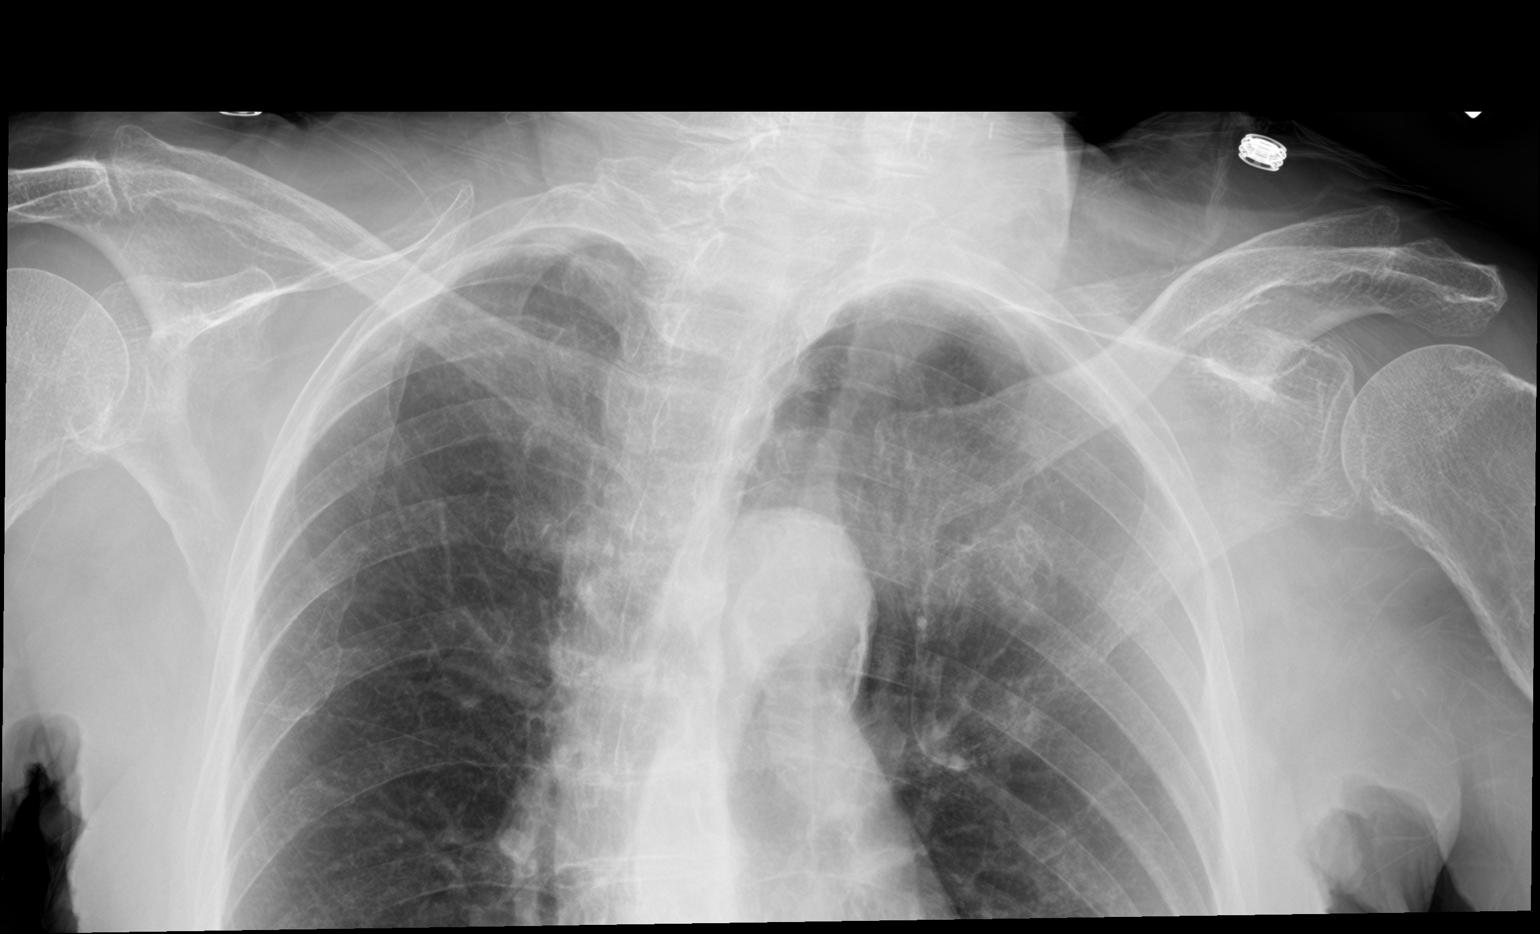

[2 of 2 positions shown; findings below may reference images not displayed]

FINDINGS: The lungs are clear. Heart size is normal. No pneumothorax or
pleural effusion. No focal bony abnormality.
IMPRESSION: No acute disease.

## 2016-10-28 IMAGING — CR DG CHEST 1V PORT
1 series · 1 of 1 positions shown · non-contrast
Comparison: 10/02/2014

CLINICAL DATA: Fever

EXAM:
PORTABLE CHEST - 1 VIEW

[ap portable]
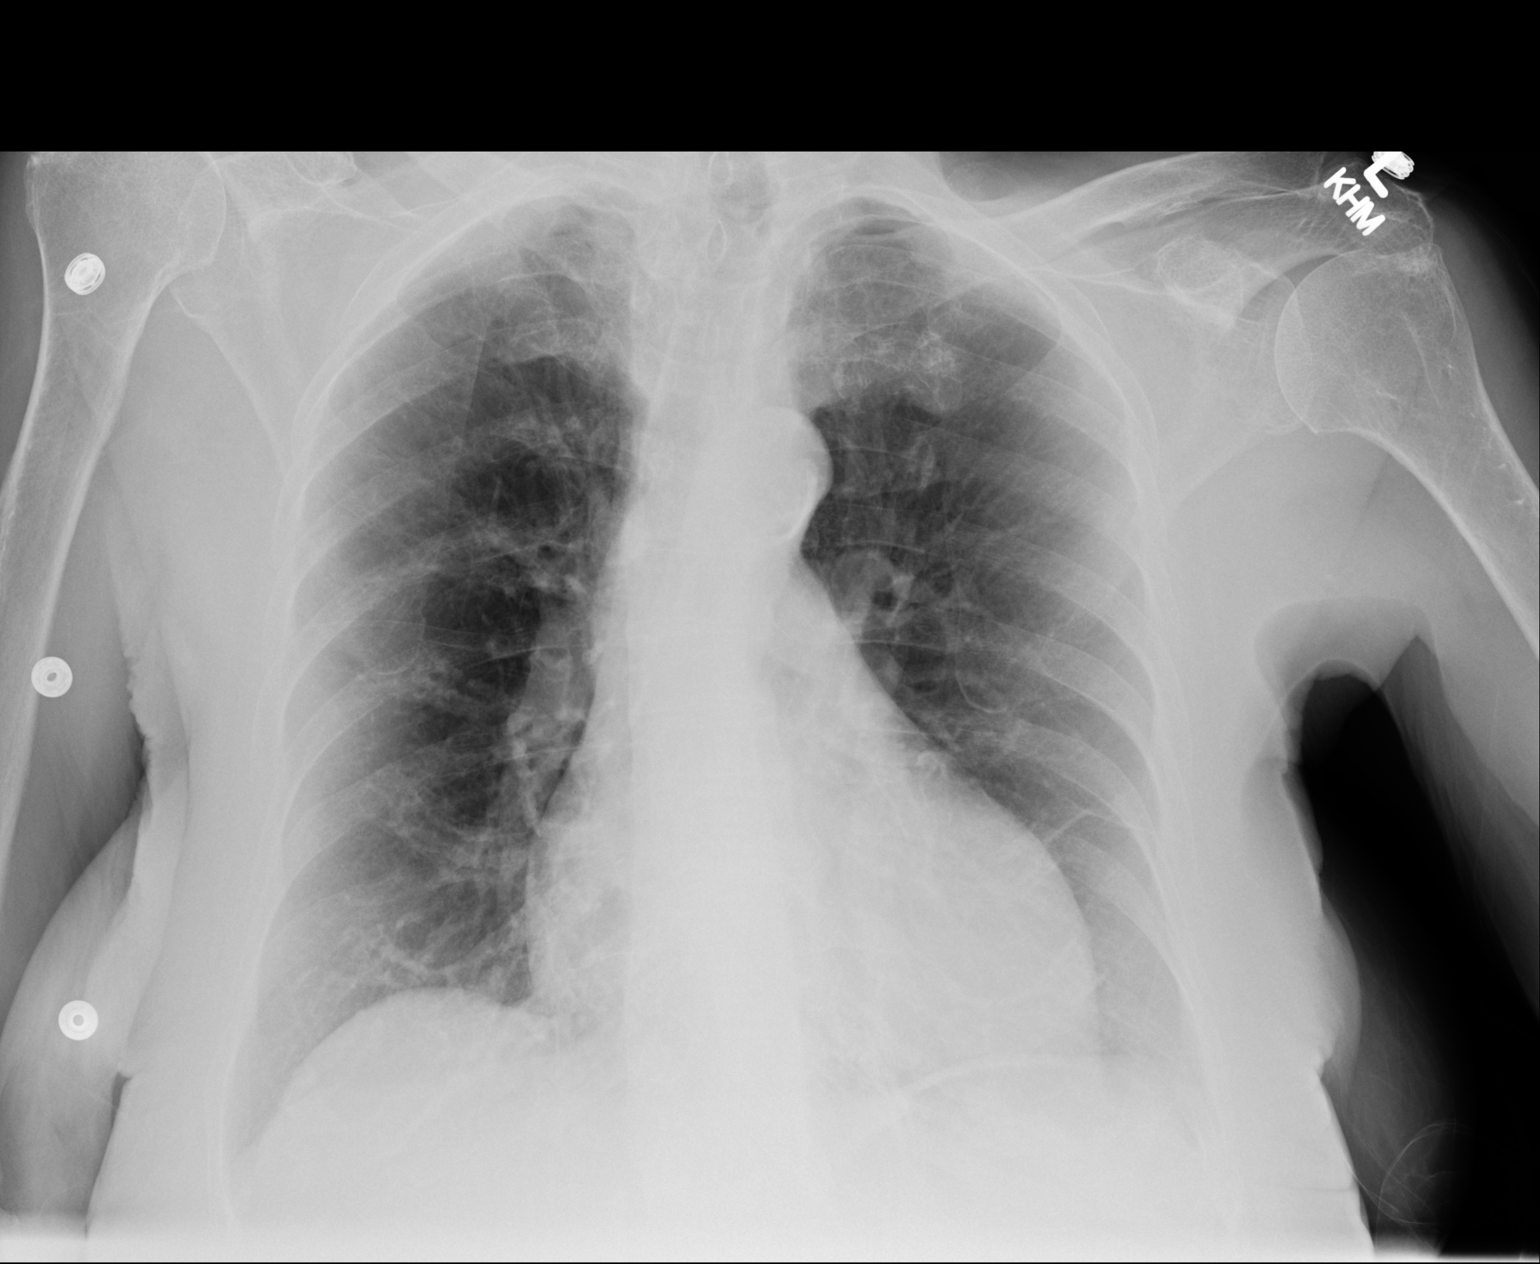

[1 of 1 positions shown; findings below may reference images not displayed]

FINDINGS: There are bilateral mild chronic bronchitic changes. There is no
focal parenchymal opacity. There is no pleural effusion or
pneumothorax. There is mild stable cardiomegaly. There is thoracic
aortic atherosclerosis.

The osseous structures are unremarkable.
IMPRESSION: No active disease.

## 2016-11-07 ENCOUNTER — Encounter (HOSPITAL_COMMUNITY): Payer: Medicare Other

## 2016-11-07 ENCOUNTER — Ambulatory Visit: Payer: Medicare Other | Admitting: Family

## 2016-11-12 ENCOUNTER — Encounter: Payer: Self-pay | Admitting: Family

## 2016-11-14 ENCOUNTER — Ambulatory Visit (INDEPENDENT_AMBULATORY_CARE_PROVIDER_SITE_OTHER): Payer: Medicare Other | Admitting: Family

## 2016-11-14 ENCOUNTER — Ambulatory Visit (HOSPITAL_COMMUNITY)
Admission: RE | Admit: 2016-11-14 | Discharge: 2016-11-14 | Disposition: A | Discharge status: Home / Self Care | Payer: Medicare Other | Source: Ambulatory Visit | Attending: Family | Admitting: Family

## 2016-11-14 ENCOUNTER — Encounter: Payer: Self-pay | Admitting: Family

## 2016-11-14 VITALS — BP 131/60 | HR 61 | Temp 97.9°F | Resp 20 | Ht 64.0 in | Wt 164.3 lb

## 2016-11-14 DIAGNOSIS — Z9889 Other specified postprocedural states: Secondary | ICD-10-CM

## 2016-11-14 DIAGNOSIS — Z87891 Personal history of nicotine dependence: Secondary | ICD-10-CM

## 2016-11-14 DIAGNOSIS — I6522 Occlusion and stenosis of left carotid artery: Secondary | ICD-10-CM

## 2016-11-14 LAB — VAS US CAROTID
LCCAPDIAS: 11 cm/s
LCCAPSYS: 112 cm/s
LEFT ECA DIAS: -2 cm/s
LEFT VERTEBRAL DIAS: 13 cm/s
Left CCA dist dias: 8 cm/s
Left CCA dist sys: 76 cm/s
Left ICA dist dias: -22 cm/s
Left ICA dist sys: -103 cm/s
Left ICA prox dias: 14 cm/s
Left ICA prox sys: 50 cm/s
RCCADSYS: -69 cm/s
RCCAPDIAS: 17 cm/s
RIGHT ECA DIAS: 10 cm/s
RIGHT VERTEBRAL DIAS: -12 cm/s
Right CCA prox sys: 122 cm/s

## 2016-11-14 NOTE — Progress Notes (Signed)
Chief Complaint: Follow up Extracranial Carotid Artery Stenosis   History of Present Illness  Marie Hunt is a 83 y.o. female who is status post left CEA in July 2011 by Dr. Scot Dock. She returns today for follow up. She denies any history of stroke or TIA, specifically she denies a history of amaurosis fugax or monocular blindness, unilateral facial drooping, hemiplegia, or receptive or expressive aphasia.   She states her PCP is aware of her occasional irregular heart beat. She has carpal tunnel syndrome in her left wrist with tingling.  She had a Left hip arthroplasty August 2016 after falling and fracturing her left hip, states she has osteoporosis, bilateral cataract extractions with IOL in June and August 2016. She was discharged from the rehab center on 10/31/14 and is doing well.   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   She was hospitalized in July, 2015, for acute kidney failure, she had a UTI. Pt states she is seeing a nephrologist but is much improved.  She also was transfused for severe anemia, is taking iron tabs, denies GI bleeding. She has no claudication symptoms with walking, denies non healing wounds.  Pt Diabetic: Yes, states in control, does not recall what her last A1C was, states her FBS is usually in the 80's Pt smoker: former smoker, quit in 2008  Pt meds include: Statin : Yes ASA: Yes Other anticoagulants/antiplatelets: no    Past Medical History:  Diagnosis Date  . Anemia   . Carotid artery disease (HCC)    1-61% RICA, patent LICA 0/96 - Dr. Scot Dock  . Cholelithiasis   . Cholelithiasis    In need of cholecystectomy  . Diabetes mellitus    Type II  . Essential hypertension, benign   . GERD (gastroesophageal reflux disease)   . GERD (gastroesophageal reflux disease)   . Mixed hyperlipidemia   . Sigmoid diverticulosis   . Sigmoid diverticulosis   . Type 2 diabetes mellitus ([place])     Social History Social  History  Substance Use Topics  . Smoking status: Former Smoker    Packs/day: 0.30    Years: 60.00    Types: Cigarettes    Quit date: 10/27/2011  . Smokeless tobacco: Never Used  . Alcohol use No    Family History Family History  Problem Relation Age of Onset  . Diabetes Mother   . Diabetes Father   . Heart attack Daughter   . Other Daughter        Bleeding problems  . Cancer Daughter   . Coronary artery disease Unknown   . Heart disease Sister   . Diabetes Sister   . Hyperlipidemia Sister   . Hypertension Sister   . Heart disease Sister   . Heart disease Sister     Surgical History Past Surgical History:  Procedure Laterality Date  . CAROTID ENDARTERECTOMY  08/24/2009   Left catroid endarterectomy  . CATARACT EXTRACTION W/PHACO Left 08/17/2014   Procedure: CATARACT EXTRACTION PHACO AND INTRAOCULAR LENS PLACEMENT; CDE:  7.39;  Surgeon: Tonny Branch, MD;  Location: [facility];  Service: Ophthalmology;  Laterality: Left;  . CATARACT EXTRACTION W/PHACO Right 10/02/2014   Procedure: CATARACT EXTRACTION PHACO AND INTRAOCULAR LENS PLACEMENT RIGHT EYE CDE=5.83;  Surgeon: Tonny Branch, MD;  Location: [facility];  Service: Ophthalmology;  Laterality: Right;  . EYE SURGERY    . HIP ARTHROPLASTY Left 10/03/2014   Procedure: ARTHROPLASTY BIPOLAR HIP (HEMIARTHROPLASTY);  Surgeon: Sanjuana Kava, MD;  Location: [facility];  Service: Orthopedics;  Laterality: Left;  . Left carotid endarterectomy  7/11   Dr. Scot Dock    Allergies  Allergen Reactions  . Codeine Nausea And Vomiting    Current Outpatient Prescriptions  Medication Sig Dispense Refill  . amLODipine (NORVASC) 10 MG tablet Take 1 tablet (10 mg total) by mouth daily.    . B Complex Vitamins (B-COMPLEX/B-12 PO) Take 1,000 mg by mouth daily.    . calcium-vitamin D (OSCAL WITH D) 500-200 MG-UNIT per tablet Take 1 tablet by mouth 2 (two) times daily.     . folic acid (FOLVITE) 1 MG tablet Take 1 mg by mouth daily.    . furosemide (LASIX) 40 MG  tablet Take 1 tablet (40 mg total) by mouth daily as needed for fluid. (Patient taking differently: Take 40 mg by mouth daily. Takes daily) 30 tablet   . glimepiride (AMARYL) 2 MG tablet Take 2 mg by mouth daily before breakfast.      . ketorolac (ACULAR) 0.5 % ophthalmic solution Apply 1 drop to eye 2 (two) times daily. To start 3 days prior to operation (10/02/14-scheduled date for operation), then continue as directed    . levothyroxine (SYNTHROID, LEVOTHROID) 25 MCG tablet Take 25 mcg by mouth daily before breakfast.    . lisinopril (PRINIVIL,ZESTRIL) 20 MG tablet Take 20 mg by mouth daily.    . metoprolol succinate (TOPROL-XL) 25 MG 24 hr tablet Take 25 mg by mouth daily.      Marland Kitchen omeprazole (PRILOSEC) 20 MG capsule Take 20 mg by mouth 2 (two) times daily.     Marland Kitchen PAZEO 0.7 % SOLN Place 1 drop into both eyes daily.    . simvastatin (ZOCOR) 20 MG tablet Take 20 mg by mouth at bedtime.       No current facility-administered medications for this visit.     Review of Systems : See HPI for pertinent positives and negatives.  Physical Examination  Vitals:   11/14/16 1533 11/14/16 1547  BP: (!) 146/72 131/60  Pulse: 61   Resp: 20   Temp: 97.9 F (36.6 C)   TempSrc: Oral   SpO2: 99%   Weight: 164 lb 4.8 oz (74.5 kg)   Height: 5\' 4"  (1.626 m)    Body mass index is 28.2 kg/m.   General: WDWN female in NAD  GAIT:slow and deliberate, using cane Eyes: PERRLA Pulmonary: Respirations are non-labored, CTAB, no rales, no rhonchi, &no wheezing.  Cardiac: regular rhythm and rate,+ murmur.  VASCULAR EXAM Carotid Bruits Right Left   tranasmitted cardiac murmur transmitted cardiac murmur    Abdominal aortic is not palpable. Radial pulses: 2+ palpable bilateral. Both brachial pulses are 3+ palpable.                                                                                                                LE Pulses Right Left       POPLITEAL  not palpable   not palpable       POSTERIOR TIBIAL  not palpable  not palpable        DORSALIS PEDIS      ANTERIOR TIBIAL 2+ palpable  2+ palpable     Gastrointestinal: soft, nontender, BS WNL, no r/g,no palpable masses.  Musculoskeletal: No muscle atrophy/wasting. M/S 4/5 throughout, extremities without ischemic changes.  Neurologic: A&O X 3; appropriate affect, speech is normal, CN 2-12 intact, Pain and light touch intact in extremities, Motor exam as listed above.    Assessment: Marie Hunt is a 83 y.o. female who is status post left CEA in July 2011.  She has no history of stroke or TIA.  DATA Carotid Duplex(11/14/16): <40% right ICA stenosisand a patent left carotid endarterectomy site with no evidence of hyperplasia or restenosis.  Bilateral vertebral artery flow is antegrade.  Bilateral subclavian artery waveforms are normal.  No significant change in comparison to the exams on 10/26/2013, 11/01/14, and 11/07/15.    Plan:  Follow-up in 18 months with Carotid Duplex scan.   I discussed in depth with the patient the nature of atherosclerosis, and emphasized the importance of maximal medical management including strict control of blood pressure, blood glucose, and lipid levels, obtaining regular exercise, and continued cessation of smoking.  The patient is aware that without maximal medical management the underlying atherosclerotic disease process will progress, limiting the benefit of any interventions. The patient was given information about stroke prevention and what symptoms should prompt the patient to seek immediate medical care. Thank you for allowing [place] to participate in this patient's care.  Clemon Chambers, RN, MSN, FNP-C [facility] Office: [phone]  Clinic Physician: Cain/Chen  11/14/16 3:49 PM

## 2016-11-14 NOTE — Patient Instructions (Signed)
Stroke Prevention Some medical conditions and behaviors are associated with an increased chance of having a stroke. You may prevent a stroke by making healthy choices and managing medical conditions. How can I reduce my risk of having a stroke?  Stay physically active. Get at least 30 minutes of activity on most or all days.  Do not smoke. It may also be helpful to avoid exposure to secondhand smoke.  Limit alcohol use. Moderate alcohol use is considered to be: ? No more than 2 drinks per day for men. ? No more than 1 drink per day for nonpregnant women.  Eat healthy foods. This involves: ? Eating 5 or more servings of fruits and vegetables a day. ? Making dietary changes that address high blood pressure (hypertension), high cholesterol, diabetes, or obesity.  Manage your cholesterol levels. ? Making food choices that are high in fiber and low in saturated fat, trans fat, and cholesterol may control cholesterol levels. ? Take any prescribed medicines to control cholesterol as directed by your health care provider.  Manage your diabetes. ? Controlling your carbohydrate and sugar intake is recommended to manage diabetes. ? Take any prescribed medicines to control diabetes as directed by your health care provider.  Control your hypertension. ? Making food choices that are low in salt (sodium), saturated fat, trans fat, and cholesterol is recommended to manage hypertension. ? Ask your health care provider if you need treatment to lower your blood pressure. Take any prescribed medicines to control hypertension as directed by your health care provider. ? If you are 18-39 years of age, have your blood pressure checked every 3-5 years. If you are 40 years of age or older, have your blood pressure checked every year.  Maintain a healthy weight. ? Reducing calorie intake and making food choices that are low in sodium, saturated fat, trans fat, and cholesterol are recommended to manage  weight.  Stop drug abuse.  Avoid taking birth control pills. ? Talk to your health care provider about the risks of taking birth control pills if you are over 35 years old, smoke, get migraines, or have ever had a blood clot.  Get evaluated for sleep disorders (sleep apnea). ? Talk to your health care provider about getting a sleep evaluation if you snore a lot or have excessive sleepiness.  Take medicines only as directed by your health care provider. ? For some people, aspirin or blood thinners (anticoagulants) are helpful in reducing the risk of forming abnormal blood clots that can lead to stroke. If you have the irregular heart rhythm of atrial fibrillation, you should be on a blood thinner unless there is a good reason you cannot take them. ? Understand all your medicine instructions.  Make sure that other conditions (such as anemia or atherosclerosis) are addressed. Get help right away if:  You have sudden weakness or numbness of the face, arm, or leg, especially on one side of the body.  Your face or eyelid droops to one side.  You have sudden confusion.  You have trouble speaking (aphasia) or understanding.  You have sudden trouble seeing in one or both eyes.  You have sudden trouble walking.  You have dizziness.  You have a loss of balance or coordination.  You have a sudden, severe headache with no known cause.  You have new chest pain or an irregular heartbeat. Any of these symptoms may represent a serious problem that is an emergency. Do not wait to see if the symptoms will go away.   Get medical help at once. Call your local emergency services (911 in [place] Do not drive yourself to the hospital. This information is not intended to replace advice given to you by your health care provider. Make sure you discuss any questions you have with your health care provider. Document Released: 03/20/2004 Document Revised: 07/19/2015 Document Reviewed: 08/13/2012 Elsevier  Interactive Patient Education  2017 [employer]     Preventing Cerebrovascular Disease Arteries are blood vessels that carry blood that contains oxygen from the heart to all parts of the body. Cerebrovascular disease affects arteries that supply the brain. Any condition that blocks or disrupts blood flow to the brain can cause cerebrovascular disease. Brain cells that lose blood supply start to die within minutes (stroke). Stroke is the main danger of cerebrovascular disease. Atherosclerosis and high blood pressure are common causes of cerebrovascular disease. Atherosclerosis is narrowing and hardening of an artery that results when fat, cholesterol, calcium, or other substances (plaque) build up inside an artery. Plaque reduces blood flow through the artery. High blood pressure increases the risk of bleeding inside the brain. Making diet and lifestyle changes to prevent atherosclerosis and high blood pressure lowers your risk of cerebrovascular disease. What nutrition changes can be made?  Eat more fruits, vegetables, and whole grains.  Reduce how much saturated fat you eat. To do this, eat less red meat and fewer full-fat dairy products.  Eat healthy proteins instead of red meat. Healthy proteins include: ? Fish. Eat fish that contains heart-healthy omega-3 fatty acids, twice a week. Examples include salmon, albacore tuna, mackerel, and herring. ? Chicken. ? Nuts. ? Low-fat or nonfat yogurt.  Avoid processed meats, like bacon and lunchmeat.  Avoid foods that contain: ? A lot of sugar, such as sweets and drinks with added sugar. ? A lot of salt (sodium). Avoid adding extra salt to your food, as told by your health care provider. ? Trans fats, such as margarine and baked goods. Trans fats may be listed as "partially hydrogenated oils" on food labels.  Check food labels to see how much sodium, sugar, and trans fats are in foods.  Use vegetable oils that contain low amounts of  saturated fat, such as olive oil or canola oil. What lifestyle changes can be made?  Drink alcohol in moderation. This means no more than 1 drink a day for nonpregnant women and 2 drinks a day for men. One drink equals 12 oz of beer, 5 oz of wine, or 1 oz of hard liquor.  If you are overweight, ask your health care provider to recommend a weight-loss plan for you. Losing 5-10 lb (2.2-4.5 kg) can reduce your risk of diabetes, atherosclerosis, and high blood pressure.  Exercise for 30?60 minutes on most days, or as much as told by your health care provider. ? Do moderate-intensity exercise, such as brisk walking, bicycling, and water aerobics. Ask your health care provider which activities are safe for you.  Do not use any products that contain nicotine or tobacco, such as cigarettes and e-cigarettes. If you need help quitting, ask your health care provider. Why are these changes important? Making these changes lowers your risk of many diseases that can cause cerebrovascular disease and stroke. Stroke is a leading cause of death and disability. Making these changes also improves your overall health and quality of life. What can I do to lower my risk? The following factors make you more likely to develop cerebrovascular disease:  Being overweight.  Smoking.  Being physically inactive.    Eating a high-fat diet.  Having certain health conditions, such as: ? Diabetes. ? High blood pressure. ? Heart disease. ? Atherosclerosis. ? High cholesterol. ? Sickle cell disease.  Talk with your health care provider about your risk for cerebrovascular disease. Work with your health care provider to control diseases that you have that may contribute to cerebrovascular disease. Your health care provider may prescribe medicines to help prevent major causes of cerebrovascular disease. Where to find more information: Learn more about preventing cerebrovascular disease from:  National Heart, Lung, and  Blood Institute: www.nhlbi.nih.gov/health/health-topics/topics/stroke  Centers for Disease Control and Prevention: cdc.gov/stroke/about.htm  Summary  Cerebrovascular disease can lead to a stroke.  Atherosclerosis and high blood pressure are major causes of cerebrovascular disease.  Making diet and lifestyle changes can reduce your risk of cerebrovascular disease.  Work with your health care provider to get your risk factors under control to reduce your risk of cerebrovascular disease. This information is not intended to replace advice given to you by your health care provider. Make sure you discuss any questions you have with your health care provider. Document Released: 02/25/2015 Document Revised: 08/31/2015 Document Reviewed: 02/25/2015 Elsevier Interactive Patient Education  2018 [employer] Inc.  

## 2018-12-17 ENCOUNTER — Other Ambulatory Visit (HOSPITAL_COMMUNITY): Payer: Self-pay | Admitting: Nephrology

## 2018-12-17 DIAGNOSIS — N184 Chronic kidney disease, stage 4 (severe): Secondary | ICD-10-CM

## 2018-12-28 ENCOUNTER — Ambulatory Visit (HOSPITAL_COMMUNITY): Payer: Medicare Other

## 2019-01-06 ENCOUNTER — Other Ambulatory Visit: Payer: Self-pay

## 2019-01-06 ENCOUNTER — Ambulatory Visit (HOSPITAL_COMMUNITY)
Admission: RE | Admit: 2019-01-06 | Discharge: 2019-01-06 | Disposition: A | Discharge status: Home / Self Care | Payer: Medicare Other | Source: Ambulatory Visit | Attending: Nephrology | Admitting: Nephrology

## 2019-01-06 DIAGNOSIS — N184 Chronic kidney disease, stage 4 (severe): Secondary | ICD-10-CM | POA: Insufficient documentation

## 2019-03-01 DIAGNOSIS — M159 Polyosteoarthritis, unspecified: Secondary | ICD-10-CM | POA: Diagnosis not present

## 2019-03-01 DIAGNOSIS — E119 Type 2 diabetes mellitus without complications: Secondary | ICD-10-CM | POA: Diagnosis not present

## 2019-03-01 DIAGNOSIS — I10 Essential (primary) hypertension: Secondary | ICD-10-CM | POA: Diagnosis not present

## 2019-03-02 DIAGNOSIS — H35033 Hypertensive retinopathy, bilateral: Secondary | ICD-10-CM | POA: Diagnosis not present

## 2019-03-02 DIAGNOSIS — H35372 Puckering of macula, left eye: Secondary | ICD-10-CM | POA: Diagnosis not present

## 2019-03-02 DIAGNOSIS — H34811 Central retinal vein occlusion, right eye, with macular edema: Secondary | ICD-10-CM | POA: Diagnosis not present

## 2019-03-02 DIAGNOSIS — H43812 Vitreous degeneration, left eye: Secondary | ICD-10-CM | POA: Diagnosis not present

## 2019-03-07 DIAGNOSIS — R21 Rash and other nonspecific skin eruption: Secondary | ICD-10-CM | POA: Diagnosis not present

## 2019-03-07 DIAGNOSIS — Z87891 Personal history of nicotine dependence: Secondary | ICD-10-CM | POA: Diagnosis not present

## 2019-03-07 DIAGNOSIS — Z299 Encounter for prophylactic measures, unspecified: Secondary | ICD-10-CM | POA: Diagnosis not present

## 2019-03-07 DIAGNOSIS — I10 Essential (primary) hypertension: Secondary | ICD-10-CM | POA: Diagnosis not present

## 2019-03-08 DIAGNOSIS — Z0000 Encounter for general adult medical examination without abnormal findings: Secondary | ICD-10-CM | POA: Diagnosis not present

## 2019-03-08 DIAGNOSIS — Z7189 Other specified counseling: Secondary | ICD-10-CM | POA: Diagnosis not present

## 2019-03-08 DIAGNOSIS — Z1211 Encounter for screening for malignant neoplasm of colon: Secondary | ICD-10-CM | POA: Diagnosis not present

## 2019-03-08 DIAGNOSIS — Z299 Encounter for prophylactic measures, unspecified: Secondary | ICD-10-CM | POA: Diagnosis not present

## 2019-03-08 DIAGNOSIS — E1165 Type 2 diabetes mellitus with hyperglycemia: Secondary | ICD-10-CM | POA: Diagnosis not present

## 2019-03-09 ENCOUNTER — Encounter: Payer: Self-pay | Admitting: Urology

## 2019-03-09 ENCOUNTER — Other Ambulatory Visit: Payer: Self-pay

## 2019-03-09 ENCOUNTER — Ambulatory Visit (INDEPENDENT_AMBULATORY_CARE_PROVIDER_SITE_OTHER): Payer: Medicare Other | Admitting: Urology

## 2019-03-09 VITALS — BP 161/53 | HR 65 | Temp 97.3°F | Ht 64.0 in | Wt 151.0 lb

## 2019-03-09 DIAGNOSIS — N3289 Other specified disorders of bladder: Secondary | ICD-10-CM | POA: Diagnosis not present

## 2019-03-09 LAB — POCT URINALYSIS DIPSTICK
Bilirubin, UA: NEGATIVE
Blood, UA: NEGATIVE
Glucose, UA: NEGATIVE
Ketones, UA: NEGATIVE
Nitrite, UA: NEGATIVE
Protein, UA: NEGATIVE
Spec Grav, UA: 1.01 (ref 1.010–1.025)
Urobilinogen, UA: NEGATIVE E.U./dL — AB
pH, UA: 5 (ref 5.0–8.0)

## 2019-03-09 MED ORDER — CIPROFLOXACIN HCL 500 MG PO TABS
500.0000 mg | ORAL_TABLET | Freq: Once | ORAL | Status: AC
  Administered 2019-03-09: 500 mg via ORAL

## 2019-03-09 NOTE — Progress Notes (Signed)
03/09/2019 4:48 PM   Marie Hunt 05-Mar-1932 [id number]  Referring provider: Monico Blitz, MD [address] [place],  [place] [address]  Bladder Mass  HPI: [name] is a [phone] with DMII who is seen in evaluation of a bladder mass found on renal US. She denies hematuria. No LUTS.    PMH: Past Medical History:  Diagnosis Date  . Anemia   . Carotid artery disease (HCC)    7-51% RICA, patent LICA 7/00 - Dr. Scot Dock  . Cholelithiasis   . Cholelithiasis    In need of cholecystectomy  . Diabetes mellitus    Type II  . Essential hypertension, benign   . GERD (gastroesophageal reflux disease)   . GERD (gastroesophageal reflux disease)   . Mixed hyperlipidemia   . Sigmoid diverticulosis   . Sigmoid diverticulosis   . Type 2 diabetes mellitus [facility])     Surgical History: Past Surgical History:  Procedure Laterality Date  . CAROTID ENDARTERECTOMY  08/24/2009   Left catroid endarterectomy  . CATARACT EXTRACTION W/PHACO Left 08/17/2014   Procedure: CATARACT EXTRACTION PHACO AND INTRAOCULAR LENS PLACEMENT; CDE:  7.39;  Surgeon: Tonny Branch, MD;  Location: [facility];  Service: Ophthalmology;  Laterality: Left;  . CATARACT EXTRACTION W/PHACO Right 10/02/2014   Procedure: CATARACT EXTRACTION PHACO AND INTRAOCULAR LENS PLACEMENT RIGHT EYE CDE=5.83;  Surgeon: Tonny Branch, MD;  Location: [facility];  Service: Ophthalmology;  Laterality: Right;  . EYE SURGERY    . HIP ARTHROPLASTY Left 10/03/2014   Procedure: ARTHROPLASTY BIPOLAR HIP (HEMIARTHROPLASTY);  Surgeon: Sanjuana Kava, MD;  Location: [facility];  Service: Orthopedics;  Laterality: Left;  . Left carotid endarterectomy  7/11   Dr. Scot Dock    Home Medications:  Allergies as of 03/09/2019      Reactions   Codeine Nausea And Vomiting      Medication List       Accurate as of March 09, 2019  4:48 PM. If you have any questions, ask your nurse or doctor.        amLODipine 10 MG tablet Commonly known as: NORVASC Take 1 tablet (10 mg  total) by mouth daily.   B-COMPLEX/B-12 PO Take 1,000 mg by mouth daily.   calcium-vitamin D 500-200 MG-UNIT tablet Commonly known as: OSCAL WITH D Take 1 tablet by mouth 2 (two) times daily.   folic acid 1 MG tablet Commonly known as: FOLVITE Take 1 mg by mouth daily.   furosemide 40 MG tablet Commonly known as: LASIX Take 1 tablet (40 mg total) by mouth daily as needed for fluid.   glimepiride 2 MG tablet Commonly known as: AMARYL Take 2 mg by mouth daily before breakfast.   ketorolac 0.5 % ophthalmic solution Commonly known as: ACULAR Apply 1 drop to eye 2 (two) times daily. To start 3 days prior to operation (10/02/14-scheduled date for operation), then continue as directed   levothyroxine 25 MCG tablet Commonly known as: SYNTHROID Take 25 mcg by mouth daily before breakfast.   lisinopril 20 MG tablet Commonly known as: ZESTRIL Take 20 mg by mouth daily.   metoprolol succinate 25 MG 24 hr tablet Commonly known as: TOPROL-XL Take 25 mg by mouth daily.   omeprazole 20 MG capsule Commonly known as: PRILOSEC Take 20 mg by mouth 2 (two) times daily.   Pazeo 0.7 % Soln Generic drug: Olopatadine HCl Place 1 drop into both eyes daily.   simvastatin 20 MG tablet Commonly known as: ZOCOR Take 20 mg by mouth at bedtime.  Allergies:  Allergies  Allergen Reactions  . Codeine Nausea And Vomiting    Family History: Family History  Problem Relation Age of Onset  . Diabetes Mother   . Diabetes Father   . Heart attack Daughter   . Other Daughter        Bleeding problems  . Cancer Daughter   . Coronary artery disease Unknown   . Heart disease Sister   . Diabetes Sister   . Hyperlipidemia Sister   . Hypertension Sister   . Heart disease Sister   . Heart disease Sister     Social History:  reports that she quit smoking about 7 years ago. Her smoking use included cigarettes. She has a 18.00 pack-year smoking history. She has never used smokeless tobacco.  She reports that she does not drink alcohol or use drugs.  ROS:                                        Physical Exam: BP (!) 161/53   Pulse 65   Temp (!) 97.3 F (36.3 C)   Ht 5\' 4"  (1.626 m)   Wt 151 lb (68.5 kg)   BMI 25.92 kg/m   Constitutional:  Alert and oriented, No acute distress. HEENT: [employer] [place] AT, moist mucus membranes.  Trachea midline, no masses. Cardiovascular: No clubbing, cyanosis, or edema. Respiratory: Normal respiratory effort, no increased work of breathing. GI: Abdomen is soft, nontender, nondistended, no abdominal masses GU: No CVA tenderness Lymph: No cervical or inguinal lymphadenopathy. Skin: No rashes, bruises or suspicious lesions. Neurologic: Grossly intact, no focal deficits, moving all 4 extremities. Psychiatric: Normal mood and affect.  Laboratory Data: Lab Results  Component Value Date   WBC 10.5 04/25/2016   HGB 10.4 (L) 04/25/2016   HCT 31.6 (L) 04/25/2016   MCV 92.7 04/25/2016   PLT 142 (L) 04/25/2016    Lab Results  Component Value Date   CREATININE 1.77 (H) 04/25/2016    No results found for: PSA  No results found for: TESTOSTERONE  Lab Results  Component Value Date   HGBA1C (H) 08/24/2009    7.3 (NOTE)                                                                       According to the ADA Clinical Practice Recommendations for 2011, when HbA1c is used as a screening test:   >=6.5%   Diagnostic of Diabetes Mellitus           (if abnormal result  is confirmed)  5.7-6.4%   Increased risk of developing Diabetes Mellitus  References:Diagnosis and Classification of Diabetes Mellitus,Diabetes TIRW,4315,40(GQQPY 1):S62-S69 and Standards of Medical Care in         Diabetes - 2011,Diabetes Care,2011,34  (Suppl 1):S11-S61.    Urinalysis    Component Value Date/Time   COLORURINE YELLOW 10/06/2014 1505   APPEARANCEUR HAZY (A) 10/06/2014 1505   LABSPEC 1.020 10/06/2014 1505   PHURINE 6.0 10/06/2014 1505   GLUCOSEU  NEGATIVE 10/06/2014 1505   HGBUR MODERATE (A) 10/06/2014 1505   BILIRUBINUR neg 03/09/2019 [place] 10/06/2014 1505   PROTEINUR Negative 03/09/2019 1613  PROTEINUR 30 (A) 10/06/2014 1505   UROBILINOGEN negative (A) 03/09/2019 1613   UROBILINOGEN 0.2 10/06/2014 1505   NITRITE neg 03/09/2019 1613   NITRITE POSITIVE (A) 10/06/2014 1505   LEUKOCYTESUR Trace (A) 03/09/2019 1613    Lab Results  Component Value Date   BACTERIA MANY (A) 10/06/2014    Pertinent Imaging: Renal US No results found for this or any previous visit. No results found for this or any previous visit. No results found for this or any previous visit. No results found for this or any previous visit. Results for orders placed during the hospital encounter of 01/06/19  US RENAL   Narrative CLINICAL DATA:  Stage 4 chronic kidney disease.  Diabetes.  EXAM: RENAL / URINARY TRACT ULTRASOUND COMPLETE  COMPARISON:  None.  FINDINGS: Right Kidney:  Renal measurements: 9.7 x 4.2 x 6.0 cm = volume: 127 mL. Diffusely increased renal parenchymal echogenicity is demonstrated. No mass or hydronephrosis visualized.  Left Kidney:  Renal measurements: 10.5 x 4.5 x 3.9 cm = volume: 96 mL. Diffusely increased renal parenchymal echogenicity noted. No mass or hydronephrosis visualized.  Bladder:  A mural polypoid soft tissue density is seen which measures 3.0 x 1.9 x 1.2 cm, and shows internal blood flow on color Doppler ultrasound. This is suspicious for bladder carcinoma.  Other:  None.  IMPRESSION: Increased renal parenchymal echogenicity, consistent with medical renal disease.  No evidence of renal mass or hydronephrosis.  3 cm polypoid soft tissue density involving the bladder wall, highly suspicious for bladder carcinoma. Consider cystoscopy for further evaluation.   Electronically Signed   By: Marlaine Hind M.D.   On: 01/06/2019 08:08    No results found for this or any previous  visit. No results found for this or any previous visit. No results found for this or any previous visit.    03/09/19  CC: No chief complaint on file.   HPI:  Blood pressure (!) 161/53, pulse 65, temperature (!) 97.3 F (36.3 C), height 5\' 4"  (1.626 m), weight 151 lb (68.5 kg). NED. A&Ox3.   No respiratory distress   Abd soft, NT, ND Normal external genitalia with patent urethral meatus  Cystoscopy Procedure Note  Patient identification was confirmed, informed consent was obtained, and patient was prepped using Betadine solution.  Lidocaine jelly was administered per urethral meatus.    Procedure: - Flexible cystoscope introduced, without any difficulty.   - Thorough search of the bladder revealed:    normal urethral meatus    normal urothelium    no stones    no ulcers     Papillary tumor 3cm involving bladder neck    no urethral polyps    no trabeculation  - Ureteral orifices were normal in position and appearance.  Post-Procedure: - Patient tolerated the procedure well    Nicolette Bang, MD  Assessment & Plan:    1. Bladder mass -schedule for TURBT. Risks/benefits/alternatives discussed - POCT urinalysis dipstick   No follow-ups on file.  Nicolette Bang, MD  [facility] [place]

## 2019-03-09 NOTE — Patient Instructions (Signed)
Transurethral Resection of Bladder Tumor  Transurethral resection of a bladder tumor is the removal (resection) of a cancerous growth (tumor) on the inside wall of the bladder. The bladder is the organ that holds urine. The tumor is removed through the tube that carries urine out of the body (urethra). In a transurethral resection, a thin telescope with a light, a tiny camera, and an electric cutting edge (resectoscope) is passed through the urethra. In men, the opening of the urethra is at the end of the penis. In women, it is just above the opening of the vagina. Tell a health care provider about:  Any allergies you have.  All medicines you are taking, including vitamins, herbs, eye drops, creams, and over-the-counter medicines.  Any problems you or family members have had with anesthetic medicines.  Any blood disorders you have.  Any surgeries you have had.  Any medical conditions you have.  Any recent urinary tract infections you have had.  Whether you are pregnant or may be pregnant. What are the risks? Generally, this is a safe procedure. However, problems may occur, including:  Infection.  Bleeding.  Allergic reactions to medicines.  Damage to nearby structures or organs, such as: ? The urethra. ? The tubes that drain urine from the kidneys into the bladder (ureters).  Pain and burning during urination.  Difficulty urinating due to partial blockage of the urethra.  Inability to urinate (urinary retention). What happens before the procedure? Staying hydrated Follow instructions from your health care provider about hydration, which may include:  Up to 2 hours before the procedure - you may continue to drink clear liquids, such as water, clear fruit juice, black coffee, and plain tea.  Eating and drinking restrictions Follow instructions from your health care provider about eating and drinking, which may include:  8 hours before the procedure - stop eating heavy  meals or foods, such as meat, fried foods, or fatty foods.  6 hours before the procedure - stop eating light meals or foods, such as toast or cereal.  6 hours before the procedure - stop drinking milk or drinks that contain milk.  2 hours before the procedure - stop drinking clear liquids. Medicines Ask your health care provider about:  Changing or stopping your regular medicines. This is especially important if you are taking diabetes medicines or blood thinners.  Taking medicines such as aspirin and ibuprofen. These medicines can thin your blood. Do not take these medicines unless your health care provider tells you to take them.  Taking over-the-counter medicines, vitamins, herbs, and supplements. Tests You may have exams or tests, including:  Physical exam.  Blood tests.  Urine tests.  Electrocardiogram (ECG). This test measures the electrical activity of the heart. General instructions  Plan to have someone take you home from the hospital or clinic.  Ask your health care provider how your surgical site will be marked or identified.  Ask your health care provider what steps will be taken to help prevent infection. These may include: ? Washing skin with a germ-killing soap. ? Taking antibiotic medicine. What happens during the procedure?  An IV will be inserted into one of your veins.  You will be given one or more of the following: ? A medicine to help you relax (sedative). ? A medicine to make you fall asleep (general anesthetic). ? A medicine that is injected into your spine to numb the area below and slightly above the injection site (spinal anesthetic).  Your legs will be   placed in foot rests (stirrups) so that your legs are apart and your knees are bent.  The resectoscope will be passed through your urethra and into your bladder.  The part of your bladder that is affected by the tumor will be resected using the cutting edge of the resectoscope.  The  resectoscope will be removed.  A thin, flexible tube (catheter) will be passed through your urethra and into your bladder. The catheter will drain urine into a bag outside of your body. ? Fluid may be passed through the catheter to keep the catheter open. The procedure may vary among health care providers and hospitals. What happens after the procedure?  Your blood pressure, heart rate, breathing rate, and blood oxygen level will be monitored until you leave the hospital or clinic.  You may continue to receive fluids and medicines through an IV.  You will have some pain. You will be given pain medicine to relieve pain.  You will have a catheter to drain your urine. ? You will have blood in your urine. Your catheter may be kept in until your urine is clear. ? The amount of urine will be monitored. If necessary, your bladder may be rinsed out (irrigated) by passing fluid through your catheter.  You will be encouraged to walk around as soon as possible.  You may have to wear compression stockings. These stockings help to prevent blood clots and reduce swelling in your legs.  Do not drive for 24 hours if you were given a sedative during your procedure. Summary  Transurethral resection of a bladder tumor is the removal (resection) of a cancerous growth (tumor) on the inside wall of the bladder.  To do this procedure, your health care provider uses a thin telescope with a light, a tiny camera, and an electric cutting edge (resectoscope).  Follow your health care provider's instructions. You may need to stop or change certain medicines, and you may be told to stop eating and drinking several hours before the procedure.  Your blood pressure, heart rate, breathing rate, and blood oxygen level will be monitored until you leave the hospital or clinic.  You may have to wear compression stockings. These stockings help to prevent blood clots and reduce swelling in your legs. This information is  not intended to replace advice given to you by your health care provider. Make sure you discuss any questions you have with your health care provider. Document Revised: 09/11/2017 Document Reviewed: 09/11/2017 Elsevier Patient Education  2020 [employer]  

## 2019-03-09 NOTE — Progress Notes (Signed)

## 2019-03-16 ENCOUNTER — Other Ambulatory Visit: Payer: Self-pay | Admitting: Urology

## 2019-03-22 ENCOUNTER — Encounter (HOSPITAL_COMMUNITY): Payer: Self-pay

## 2019-03-25 ENCOUNTER — Encounter (HOSPITAL_COMMUNITY): Payer: Self-pay

## 2019-03-25 ENCOUNTER — Other Ambulatory Visit: Payer: Self-pay

## 2019-03-25 ENCOUNTER — Encounter (HOSPITAL_COMMUNITY)
Admission: RE | Admit: 2019-03-25 | Discharge: 2019-03-25 | Disposition: A | Discharge status: Home / Self Care | Payer: Medicare Other | Source: Ambulatory Visit | Attending: Urology | Admitting: Urology

## 2019-03-25 ENCOUNTER — Other Ambulatory Visit (HOSPITAL_COMMUNITY)
Admission: RE | Admit: 2019-03-25 | Discharge: 2019-03-25 | Disposition: A | Discharge status: Home / Self Care | Payer: Medicare Other | Source: Ambulatory Visit | Attending: Nephrology | Admitting: Nephrology

## 2019-03-25 ENCOUNTER — Other Ambulatory Visit (HOSPITAL_COMMUNITY)
Admission: RE | Admit: 2019-03-25 | Discharge: 2019-03-25 | Disposition: A | Discharge status: Home / Self Care | Payer: Medicare Other | Source: Ambulatory Visit | Attending: Urology | Admitting: Urology

## 2019-03-25 DIAGNOSIS — E119 Type 2 diabetes mellitus without complications: Secondary | ICD-10-CM | POA: Diagnosis not present

## 2019-03-25 DIAGNOSIS — R9431 Abnormal electrocardiogram [ECG] [EKG]: Secondary | ICD-10-CM | POA: Diagnosis not present

## 2019-03-25 DIAGNOSIS — D494 Neoplasm of unspecified behavior of bladder: Secondary | ICD-10-CM | POA: Diagnosis not present

## 2019-03-25 DIAGNOSIS — Z9049 Acquired absence of other specified parts of digestive tract: Secondary | ICD-10-CM | POA: Diagnosis not present

## 2019-03-25 DIAGNOSIS — I491 Atrial premature depolarization: Secondary | ICD-10-CM | POA: Insufficient documentation

## 2019-03-25 DIAGNOSIS — I6523 Occlusion and stenosis of bilateral carotid arteries: Secondary | ICD-10-CM | POA: Insufficient documentation

## 2019-03-25 DIAGNOSIS — I2510 Atherosclerotic heart disease of native coronary artery without angina pectoris: Secondary | ICD-10-CM | POA: Diagnosis not present

## 2019-03-25 DIAGNOSIS — Z96642 Presence of left artificial hip joint: Secondary | ICD-10-CM | POA: Diagnosis not present

## 2019-03-25 DIAGNOSIS — I10 Essential (primary) hypertension: Secondary | ICD-10-CM | POA: Diagnosis not present

## 2019-03-25 DIAGNOSIS — Z9842 Cataract extraction status, left eye: Secondary | ICD-10-CM | POA: Insufficient documentation

## 2019-03-25 DIAGNOSIS — Z01818 Encounter for other preprocedural examination: Secondary | ICD-10-CM | POA: Insufficient documentation

## 2019-03-25 DIAGNOSIS — Z20822 Contact with and (suspected) exposure to covid-19: Secondary | ICD-10-CM | POA: Diagnosis not present

## 2019-03-25 DIAGNOSIS — Z87891 Personal history of nicotine dependence: Secondary | ICD-10-CM | POA: Insufficient documentation

## 2019-03-25 LAB — RENAL FUNCTION PANEL
Albumin: 4.4 g/dL (ref 3.5–5.0)
Anion gap: 11 (ref 5–15)
BUN: 45 mg/dL — ABNORMAL HIGH (ref 8–23)
CO2: 24 mmol/L (ref 22–32)
Calcium: 9.4 mg/dL (ref 8.9–10.3)
Chloride: 106 mmol/L (ref 98–111)
Creatinine, Ser: 2.53 mg/dL — ABNORMAL HIGH (ref 0.44–1.00)
GFR calc Af Amer: 20 mL/min — ABNORMAL LOW (ref >60)
GFR calc non Af Amer: 17 mL/min — ABNORMAL LOW (ref >60)
Glucose, Bld: 235 mg/dL — ABNORMAL HIGH (ref 70–99)
Phosphorus: 4 mg/dL (ref 2.5–4.6)
Potassium: 4.1 mmol/L (ref 3.5–5.1)
Sodium: 141 mmol/L (ref 135–145)

## 2019-03-25 LAB — CBC
HCT: 35.8 % — ABNORMAL LOW (ref 36.0–46.0)
Hemoglobin: 11.3 g/dL — ABNORMAL LOW (ref 12.0–15.0)
MCH: 30.5 pg (ref 26.0–34.0)
MCHC: 31.6 g/dL (ref 30.0–36.0)
MCV: 96.5 fL (ref 80.0–100.0)
Platelets: 182 10*3/uL (ref 150–400)
RBC: 3.71 MIL/uL — ABNORMAL LOW (ref 3.87–5.11)
RDW: 14.2 % (ref 11.5–15.5)
WBC: 9.7 10*3/uL (ref 4.0–10.5)
nRBC: 0 % (ref 0.0–0.2)

## 2019-03-25 LAB — PROTEIN / CREATININE RATIO, URINE
Creatinine, Urine: 210.65 mg/dL
Protein Creatinine Ratio: 0.08 mg/mg{Cre} (ref 0.00–0.15)
Total Protein, Urine: 16 mg/dL

## 2019-03-25 LAB — BASIC METABOLIC PANEL
Anion gap: 12 (ref 5–15)
BUN: 45 mg/dL — ABNORMAL HIGH (ref 8–23)
CO2: 23 mmol/L (ref 22–32)
Calcium: 9.2 mg/dL (ref 8.9–10.3)
Chloride: 105 mmol/L (ref 98–111)
Creatinine, Ser: 2.58 mg/dL — ABNORMAL HIGH (ref 0.44–1.00)
GFR calc Af Amer: 19 mL/min — ABNORMAL LOW (ref >60)
GFR calc non Af Amer: 16 mL/min — ABNORMAL LOW (ref >60)
Glucose, Bld: 231 mg/dL — ABNORMAL HIGH (ref 70–99)
Potassium: 3.8 mmol/L (ref 3.5–5.1)
Sodium: 140 mmol/L (ref 135–145)

## 2019-03-25 LAB — IRON AND TIBC
Iron: 51 ug/dL (ref 28–170)
Saturation Ratios: 17 % (ref 10.4–31.8)
TIBC: 306 ug/dL (ref 250–450)
UIBC: 255 ug/dL

## 2019-03-25 LAB — HEMOGLOBIN A1C
Hgb A1c MFr Bld: 7 % — ABNORMAL HIGH (ref 4.8–5.6)
Mean Plasma Glucose: 154.2 mg/dL

## 2019-03-25 LAB — VITAMIN D 25 HYDROXY (VIT D DEFICIENCY, FRACTURES): Vit D, 25-Hydroxy: 26.83 ng/mL — ABNORMAL LOW (ref 30–100)

## 2019-03-25 LAB — URIC ACID: Uric Acid, Serum: 8.3 mg/dL — ABNORMAL HIGH (ref 2.5–7.1)

## 2019-03-25 LAB — SARS CORONAVIRUS 2 (TAT 6-24 HRS): SARS Coronavirus 2: NEGATIVE

## 2019-03-25 NOTE — Patient Instructions (Addendum)
Your procedure is scheduled on: 03/28/2019  Report to [facility] at  6:30   AM.  Call this number if you have problems the morning of surgery: [phone]   Remember:   Do not Eat or Drink after midnight   :  Take these medicines the morning of surgery with A SIP OF WATER: Amlodipine, Pepcid, levothyroxine, and metoprolol   Do not wear jewelry, make-up or nail polish.  Do not wear lotions, powders, or perfumes. You may wear deodorant.  Do not shave 48 hours prior to surgery. Men may shave face and neck.  Do not bring valuables to the hospital.  Contacts, dentures or bridgework may not be worn into surgery.  Leave suitcase in the car. After surgery it may be brought to your room.  For patients admitted to the hospital, checkout time is 11:00 AM the day of discharge.   Patients discharged the day of surgery will not be allowed to drive home.    Special Instructions: Shower using CHG night before surgery and shower the day of surgery use CHG.  Use special wash - you have one bottle of CHG for all showers.  You should use approximately 1/2 of the bottle for each shower.  Cystoscopy Cystoscopy is a procedure that is used to help diagnose and sometimes treat conditions that affect the lower urinary tract. The lower urinary tract includes the bladder and the urethra. The urethra is the tube that drains urine from the bladder. Cystoscopy is done using a thin, tube-shaped instrument with a light and camera at the end (cystoscope). The cystoscope may be hard or flexible, depending on the goal of the procedure. The cystoscope is inserted through the urethra, into the bladder. Cystoscopy may be recommended if you have:  Urinary tract infections that keep coming back.  Blood in the urine (hematuria).  An inability to control when you urinate (urinary incontinence) or an overactive bladder.  Unusual cells found in a urine sample.  A blockage in the urethra, such as a urinary stone.  Painful  urination.  An abnormality in the bladder found during an intravenous pyelogram (IVP) or CT scan. Cystoscopy may also be done to remove a sample of tissue to be examined under a microscope (biopsy). Tell a health care provider about:  Any allergies you have.  All medicines you are taking, including vitamins, herbs, eye drops, creams, and over-the-counter medicines.  Any problems you or family members have had with anesthetic medicines.  Any blood disorders you have.  Any surgeries you have had.  Any medical conditions you have.  Whether you are pregnant or may be pregnant. What are the risks? Generally, this is a safe procedure. However, problems may occur, including:  Infection.  Bleeding.  Allergic reactions to medicines.  Damage to other structures or organs. What happens before the procedure?  Ask your health care provider about: ? Changing or stopping your regular medicines. This is especially important if you are taking diabetes medicines or blood thinners. ? Taking medicines such as aspirin and ibuprofen. These medicines can thin your blood. Do not take these medicines unless your health care provider tells you to take them. ? Taking over-the-counter medicines, vitamins, herbs, and supplements.  Follow instructions from your health care provider about eating or drinking restrictions.  Ask your health care provider what steps will be taken to help prevent infection. These may include: ? Washing skin with a germ-killing soap. ? Taking antibiotic medicine.  You may have an exam or  testing, such as: ? X-rays of the bladder, urethra, or kidneys. ? Urine tests to check for signs of infection.  Plan to have someone take you home from the hospital or clinic. What happens during the procedure?   You will be given one or more of the following: ? A medicine to help you relax (sedative). ? A medicine to numb the area (local anesthetic).  The area around the opening of  your urethra will be cleaned.  The cystoscope will be passed through your urethra into your bladder.  Germ-free (sterile) fluid will flow through the cystoscope to fill your bladder. The fluid will stretch your bladder so that your health care provider can clearly examine your bladder walls.  Your doctor will look at the urethra and bladder. Your doctor may take a biopsy or remove stones.  The cystoscope will be removed, and your bladder will be emptied. The procedure may vary among health care providers and hospitals. What can I expect after the procedure? After the procedure, it is common to have:  Some soreness or pain in your abdomen and urethra.  Urinary symptoms. These include: ? Mild pain or burning when you urinate. Pain should stop within a few minutes after you urinate. This may last for up to 1 week. ? A small amount of blood in your urine for several days. ? Feeling like you need to urinate but producing only a small amount of urine. Follow these instructions at home: Medicines  Take over-the-counter and prescription medicines only as told by your health care provider.  If you were prescribed an antibiotic medicine, take it as told by your health care provider. Do not stop taking the antibiotic even if you start to feel better. General instructions  Return to your normal activities as told by your health care provider. Ask your health care provider what activities are safe for you.  Do not drive for 24 hours if you were given a sedative during your procedure.  Watch for any blood in your urine. If the amount of blood in your urine increases, call your health care provider.  Follow instructions from your health care provider about eating or drinking restrictions.  If a tissue sample was removed for testing (biopsy) during your procedure, it is up to you to get your test results. Ask your health care provider, or the department that is doing the test, when your results  will be ready.  Drink enough fluid to keep your urine pale yellow.  Keep all follow-up visits as told by your health care provider. This is important. Contact a health care provider if you:  Have pain that gets worse or does not get better with medicine, especially pain when you urinate.  Have trouble urinating.  Have more blood in your urine. Get help right away if you:  Have blood clots in your urine.  Have abdominal pain.  Have a fever or chills.  Are unable to urinate. Summary  Cystoscopy is a procedure that is used to help diagnose and sometimes treat conditions that affect the lower urinary tract.  Cystoscopy is done using a thin, tube-shaped instrument with a light and camera at the end.  After the procedure, it is common to have some soreness or pain in your abdomen and urethra.  Watch for any blood in your urine. If the amount of blood in your urine increases, call your health care provider.  If you were prescribed an antibiotic medicine, take it as told by your health  care provider. Do not stop taking the antibiotic even if you start to feel better. This information is not intended to replace advice given to you by your health care provider. Make sure you discuss any questions you have with your health care provider. Document Revised: 02/02/2018 Document Reviewed: 02/02/2018 Elsevier Patient Education  [place].  Transurethral Resection of Bladder Tumor, Care After This sheet gives you information about how to care for yourself after your procedure. Your health care provider may also give you more specific instructions. If you have problems or questions, contact your health care provider. What can I expect after the procedure? After the procedure, it is common to have:  A small amount of blood in your urine for up to 2 weeks.  Soreness or mild pain from your catheter. After your catheter is removed, you may have mild soreness, especially when  urinating.  Pain in your lower abdomen. Follow these instructions at home: Medicines   Take over-the-counter and prescription medicines only as told by your health care provider.  If you were prescribed an antibiotic medicine, take it as told by your health care provider. Do not stop taking the antibiotic even if you start to feel better.  Do not drive for 24 hours if you were given a sedative during your procedure.  Ask your health care provider if the medicine prescribed to you: ? Requires you to avoid driving or using heavy machinery. ? Can cause constipation. You may need to take these actions to prevent or treat constipation:  Take over-the-counter or prescription medicines.  Eat foods that are high in fiber, such as beans, whole grains, and fresh fruits and vegetables.  Limit foods that are high in fat and processed sugars, such as fried or sweet foods. Activity  Return to your normal activities as told by your health care provider. Ask your health care provider what activities are safe for you.  Do not lift anything that is heavier than 10 lb (4.5 kg), or the limit that you are told, until your health care provider says that it is safe.  Avoid intense physical activity for as long as told by your health care provider.  Rest as told by your health care provider.  Avoid sitting for a long time without moving. Get up to take short walks every 1-2 hours. This is important to improve blood flow and breathing. Ask for help if you feel weak or unsteady. General instructions   Do not drink alcohol for as long as told by your health care provider. This is especially important if you are taking prescription pain medicines.  Do not take baths, swim, or use a hot tub until your health care provider approves. Ask your health care provider if you may take showers. You may only be allowed to take sponge baths.  If you have a catheter, follow instructions from your health care provider  about caring for your catheter and your drainage bag.  Drink enough fluid to keep your urine pale yellow.  Wear compression stockings as told by your health care provider. These stockings help to prevent blood clots and reduce swelling in your legs.  Keep all follow-up visits as told by your health care provider. This is important. ? You will need to be followed closely with regular checks of your bladder and urethra (cystoscopies) to make sure that the cancer does not come back. Contact a health care provider if:  You have pain that gets worse or does not improve  with medicine.  You have blood in your urine for more than 2 weeks.  You have cloudy or bad-smelling urine.  You become constipated. Signs of constipation may include having: ? Fewer than three bowel movements in a week. ? Difficulty having a bowel movement. ? Stools that are dry, hard, or larger than normal.  You have a fever. Get help right away if:  You have: ? Severe pain. ? Bright red blood in your urine. ? Blood clots in your urine. ? A lot of blood in your urine.  Your catheter has been removed and you are not able to urinate.  You have a catheter in place and the catheter is not draining urine. Summary  After your procedure, it is common to have a small amount of blood in your urine, soreness or mild pain from your catheter, and pain in your lower abdomen.  Take over-the-counter and prescription medicines only as told by your health care provider.  Rest as told by your health care provider. Follow your health care provider's instructions about returning to normal activities. Ask what activities are safe for you.  If you have a catheter, follow instructions from your health care provider about caring for your catheter and your drainage bag.  Get help right away if you cannot urinate, you have severe pain, or you have bright red blood or blood clots in your urine. This information is not intended to replace  advice given to you by your health care provider. Make sure you discuss any questions you have with your health care provider. Document Revised: 09/10/2017 Document Reviewed: 09/10/2017 Elsevier Patient Education  2020 [place] Anesthesia, Adult, Care After This sheet gives you information about how to care for yourself after your procedure. Your health care provider may also give you more specific instructions. If you have problems or questions, contact your health care provider. What can I expect after the procedure? After the procedure, the following side effects are common:  Pain or discomfort at the IV site.  Nausea.  Vomiting.  Sore throat.  Trouble concentrating.  Feeling cold or chills.  Weak or tired.  Sleepiness and fatigue.  Soreness and body aches. These side effects can affect parts of the body that were not involved in surgery. Follow these instructions at home:  For at least 24 hours after the procedure:  Have a responsible adult stay with you. It is important to have someone help care for you until you are awake and alert.  Rest as needed.  Do not: ? Participate in activities in which you could fall or become injured. ? Drive. ? Use heavy machinery. ? Drink alcohol. ? Take sleeping pills or medicines that cause drowsiness. ? Make important decisions or sign legal documents. ? Take care of children on your own. Eating and drinking  Follow any instructions from your health care provider about eating or drinking restrictions.  When you feel hungry, start by eating small amounts of foods that are soft and easy to digest (bland), such as toast. Gradually return to your regular diet.  Drink enough fluid to keep your urine pale yellow.  If you vomit, rehydrate by drinking water, juice, or clear broth. General instructions  If you have sleep apnea, surgery and certain medicines can increase your risk for breathing problems. Follow  instructions from your health care provider about wearing your sleep device: ? Anytime you are sleeping, including during daytime naps. ? While taking prescription pain medicines, sleeping medicines, or  medicines that make you drowsy.  Return to your normal activities as told by your health care provider. Ask your health care provider what activities are safe for you.  Take over-the-counter and prescription medicines only as told by your health care provider.  If you smoke, do not smoke without supervision.  Keep all follow-up visits as told by your health care provider. This is important. Contact a health care provider if:  You have nausea or vomiting that does not get better with medicine.  You cannot eat or drink without vomiting.  You have pain that does not get better with medicine.  You are unable to pass urine.  You develop a skin rash.  You have a fever.  You have redness around your IV site that gets worse. Get help right away if:  You have difficulty breathing.  You have chest pain.  You have blood in your urine or stool, or you vomit blood. Summary  After the procedure, it is common to have a sore throat or nausea. It is also common to feel tired.  Have a responsible adult stay with you for the first 24 hours after general anesthesia. It is important to have someone help care for you until you are awake and alert.  When you feel hungry, start by eating small amounts of foods that are soft and easy to digest (bland), such as toast. Gradually return to your regular diet.  Drink enough fluid to keep your urine pale yellow.  Return to your normal activities as told by your health care provider. Ask your health care provider what activities are safe for you. This information is not intended to replace advice given to you by your health care provider. Make sure you discuss any questions you have with your health care provider. Document Revised: 02/13/2017 Document  Reviewed: 09/26/2016 Elsevier Patient Education  [place].

## 2019-03-26 LAB — PTH, INTACT AND CALCIUM
Calcium, Total (PTH): 9.6 mg/dL (ref 8.7–10.3)
PTH: 138 pg/mL — ABNORMAL HIGH (ref 15–65)

## 2019-03-28 ENCOUNTER — Ambulatory Visit (HOSPITAL_COMMUNITY)
Admission: RE | Admit: 2019-03-28 | Discharge: 2019-03-28 | Disposition: A | Discharge status: Home / Self Care | Payer: Medicare Other | Attending: Urology | Admitting: Urology

## 2019-03-28 ENCOUNTER — Encounter (HOSPITAL_COMMUNITY): Payer: Self-pay | Admitting: Urology

## 2019-03-28 ENCOUNTER — Ambulatory Visit (HOSPITAL_COMMUNITY): Payer: Medicare Other | Admitting: Anesthesiology

## 2019-03-28 ENCOUNTER — Encounter (HOSPITAL_COMMUNITY)
Admission: RE | Disposition: A | Discharge status: Home / Self Care | Payer: Self-pay | Source: Home / Self Care | Attending: Urology

## 2019-03-28 ENCOUNTER — Ambulatory Visit (HOSPITAL_COMMUNITY): Payer: Medicare Other

## 2019-03-28 DIAGNOSIS — I10 Essential (primary) hypertension: Secondary | ICD-10-CM | POA: Diagnosis not present

## 2019-03-28 DIAGNOSIS — Z87891 Personal history of nicotine dependence: Secondary | ICD-10-CM | POA: Insufficient documentation

## 2019-03-28 DIAGNOSIS — E119 Type 2 diabetes mellitus without complications: Secondary | ICD-10-CM | POA: Diagnosis not present

## 2019-03-28 DIAGNOSIS — Z96642 Presence of left artificial hip joint: Secondary | ICD-10-CM | POA: Insufficient documentation

## 2019-03-28 DIAGNOSIS — K219 Gastro-esophageal reflux disease without esophagitis: Secondary | ICD-10-CM | POA: Insufficient documentation

## 2019-03-28 DIAGNOSIS — D494 Neoplasm of unspecified behavior of bladder: Secondary | ICD-10-CM

## 2019-03-28 DIAGNOSIS — C678 Malignant neoplasm of overlapping sites of bladder: Secondary | ICD-10-CM | POA: Insufficient documentation

## 2019-03-28 HISTORY — PX: CYSTOSCOPY W/ RETROGRADES: SHX1426

## 2019-03-28 HISTORY — PX: URETEROSCOPY: SHX842

## 2019-03-28 HISTORY — PX: TRANSURETHRAL RESECTION OF BLADDER TUMOR: SHX2575

## 2019-03-28 LAB — GLUCOSE, CAPILLARY
Glucose-Capillary: 150 mg/dL — ABNORMAL HIGH (ref 70–99)
Glucose-Capillary: 145 mg/dL — ABNORMAL HIGH (ref 70–99)

## 2019-03-28 SURGERY — TURBT (TRANSURETHRAL RESECTION OF BLADDER TUMOR)
Anesthesia: General | Site: Ureter

## 2019-03-28 MED ORDER — PROMETHAZINE HCL 25 MG/ML IJ SOLN: 6.2500 mg | INTRAMUSCULAR | Status: DC | PRN

## 2019-03-28 MED ORDER — ROCURONIUM BROMIDE 100 MG/10ML IV SOLN
INTRAVENOUS | Status: DC | PRN
  Administered 2019-03-28: 10 mg via INTRAVENOUS
  Administered 2019-03-28: 30 mg via INTRAVENOUS

## 2019-03-28 MED ORDER — SODIUM CHLORIDE 0.9 % IR SOLN
Status: DC | PRN
  Administered 2019-03-28 (×3): 3000 mL

## 2019-03-28 MED ORDER — LIDOCAINE 2% (20 MG/ML) 5 ML SYRINGE
INTRAMUSCULAR | Status: AC
  Filled 2019-03-28: qty 5

## 2019-03-28 MED ORDER — FENTANYL CITRATE (PF) 100 MCG/2ML IJ SOLN
INTRAMUSCULAR | Status: AC
  Filled 2019-03-28: qty 2

## 2019-03-28 MED ORDER — ONDANSETRON HCL 4 MG/2ML IJ SOLN
INTRAMUSCULAR | Status: DC | PRN
  Administered 2019-03-28: 4 mg via INTRAVENOUS

## 2019-03-28 MED ORDER — FENTANYL CITRATE (PF) 100 MCG/2ML IJ SOLN
INTRAMUSCULAR | Status: DC | PRN
  Administered 2019-03-28: 100 ug via INTRAVENOUS
  Administered 2019-03-28 (×2): 50 ug via INTRAVENOUS

## 2019-03-28 MED ORDER — DEXAMETHASONE SODIUM PHOSPHATE 4 MG/ML IJ SOLN
INTRAMUSCULAR | Status: DC | PRN
  Administered 2019-03-28: 6 mg via INTRAVENOUS

## 2019-03-28 MED ORDER — PROPOFOL 10 MG/ML IV BOLUS
INTRAVENOUS | Status: AC
  Filled 2019-03-28: qty 20

## 2019-03-28 MED ORDER — SUGAMMADEX SODIUM 500 MG/5ML IV SOLN
INTRAVENOUS | Status: AC
  Filled 2019-03-28: qty 5

## 2019-03-28 MED ORDER — EPHEDRINE 5 MG/ML INJ
INTRAVENOUS | Status: AC
  Filled 2019-03-28: qty 10

## 2019-03-28 MED ORDER — EPHEDRINE SULFATE 50 MG/ML IJ SOLN
INTRAMUSCULAR | Status: DC | PRN
  Administered 2019-03-28 (×2): 10 mg via INTRAVENOUS

## 2019-03-28 MED ORDER — ROCURONIUM BROMIDE 10 MG/ML (PF) SYRINGE
PREFILLED_SYRINGE | INTRAVENOUS | Status: AC
  Filled 2019-03-28: qty 10

## 2019-03-28 MED ORDER — SUGAMMADEX SODIUM 200 MG/2ML IV SOLN
INTRAVENOUS | Status: DC | PRN
  Administered 2019-03-28: 200 mg via INTRAVENOUS

## 2019-03-28 MED ORDER — LACTATED RINGERS IV SOLN: INTRAVENOUS | Status: DC

## 2019-03-28 MED ORDER — CEFAZOLIN SODIUM-DEXTROSE 2-4 GM/100ML-% IV SOLN
2.0000 g | INTRAVENOUS | Status: AC
  Administered 2019-03-28: 2 g via INTRAVENOUS
  Filled 2019-03-28: qty 100

## 2019-03-28 MED ORDER — TRAMADOL HCL 50 MG PO TABS: 50.0000 mg | ORAL_TABLET | Freq: Four times a day (QID) | ORAL | 0 refills | Status: AC | PRN

## 2019-03-28 MED ORDER — DIATRIZOATE MEGLUMINE 30 % UR SOLN
URETHRAL | Status: AC
  Filled 2019-03-28: qty 100

## 2019-03-28 MED ORDER — PROPOFOL 10 MG/ML IV BOLUS
INTRAVENOUS | Status: DC | PRN
  Administered 2019-03-28: 100 mg via INTRAVENOUS

## 2019-03-28 MED ORDER — DEXAMETHASONE SODIUM PHOSPHATE 10 MG/ML IJ SOLN
INTRAMUSCULAR | Status: AC
  Filled 2019-03-28: qty 1

## 2019-03-28 MED ORDER — STERILE WATER FOR IRRIGATION IR SOLN
Status: DC | PRN
  Administered 2019-03-28: 500 mL

## 2019-03-28 MED ORDER — DIATRIZOATE MEGLUMINE 30 % UR SOLN
URETHRAL | Status: DC | PRN
  Administered 2019-03-28: 8 mL via URETHRAL
  Administered 2019-03-28: 7 mL via URETHRAL

## 2019-03-28 MED ORDER — LIDOCAINE HCL (CARDIAC) PF 100 MG/5ML IV SOSY
PREFILLED_SYRINGE | INTRAVENOUS | Status: DC | PRN
  Administered 2019-03-28: 60 mg via INTRAVENOUS

## 2019-03-28 MED ORDER — MIDAZOLAM HCL 2 MG/2ML IJ SOLN: 0.5000 mg | Freq: Once | INTRAMUSCULAR | Status: DC | PRN

## 2019-03-28 MED ORDER — ONDANSETRON HCL 4 MG/2ML IJ SOLN
INTRAMUSCULAR | Status: AC
  Filled 2019-03-28: qty 2

## 2019-03-28 SURGICAL SUPPLY — 31 items
BAG DRAIN URO TABLE W/ADPT NS (BAG) ×5 IMPLANT
BAG DRN 8 ADPR NS SKTRN CSTL (BAG) ×3
BAG DRN RND TRDRP ANRFLXCHMBR (UROLOGICAL SUPPLIES) ×3
BAG HAMPER (MISCELLANEOUS) ×5 IMPLANT
BAG URINE DRAIN 2000ML AR STRL (UROLOGICAL SUPPLIES) ×5 IMPLANT
CATH FOLEY 3WAY 30CC 22F (CATHETERS) ×2 IMPLANT
CATH INTERMIT  6FR 70CM (CATHETERS) ×5 IMPLANT
CLOTH BEACON ORANGE TIMEOUT ST (SAFETY) ×5 IMPLANT
ELECT LOOP 22F BIPOLAR SML (ELECTROSURGICAL) ×5
ELECT REM PT RETURN 9FT ADLT (ELECTROSURGICAL) ×5
ELECTRODE LOOP 22F BIPOLAR SML (ELECTROSURGICAL) ×3 IMPLANT
ELECTRODE REM PT RTRN 9FT ADLT (ELECTROSURGICAL) ×3 IMPLANT
GLOVE BIO SURGEON STRL SZ8 (GLOVE) ×5 IMPLANT
GLOVE BIOGEL PI IND STRL 7.0 (GLOVE) ×6 IMPLANT
GLOVE BIOGEL PI INDICATOR 7.0 (GLOVE) ×4
GLOVE ECLIPSE 6.5 STRL STRAW (GLOVE) ×2 IMPLANT
GOWN STRL REUS W/TWL LRG LVL3 (GOWN DISPOSABLE) ×5 IMPLANT
GOWN STRL REUS W/TWL XL LVL3 (GOWN DISPOSABLE) ×5 IMPLANT
GUIDEWIRE ANG ZIPWIRE 038X150 (WIRE) ×2 IMPLANT
IV NS IRRIG 3000ML ARTHROMATIC (IV SOLUTION) ×9 IMPLANT
KIT TURNOVER CYSTO (KITS) ×5 IMPLANT
MANIFOLD NEPTUNE II (INSTRUMENTS) ×5 IMPLANT
PACK CYSTO (CUSTOM PROCEDURE TRAY) ×5 IMPLANT
PAD ARMBOARD 7.5X6 YLW CONV (MISCELLANEOUS) ×5 IMPLANT
PLUG CATH AND CAP STER (CATHETERS) ×5 IMPLANT
STENT URET 6FRX24 CONTOUR (STENTS) ×2 IMPLANT
SYR 30ML LL (SYRINGE) ×5 IMPLANT
SYR TOOMEY IRRIG 70ML (MISCELLANEOUS) ×5
SYRINGE TOOMEY IRRIG 70ML (MISCELLANEOUS) IMPLANT
TOWEL OR 17X26 4PK STRL BLUE (TOWEL DISPOSABLE) ×5 IMPLANT
WATER STERILE IRR 500ML POUR (IV SOLUTION) ×5 IMPLANT

## 2019-03-28 NOTE — Progress Notes (Signed)
Noted generalized rash to torso and bilateral legs. Patient states "I have had this and I put creams on it".  Patient verbalized understanding not to use creams in private area with catheter.

## 2019-03-28 NOTE — Anesthesia Postprocedure Evaluation (Signed)
Anesthesia Post Note  Patient: Marie Hunt  Procedure(s) Performed: TRANSURETHRAL RESECTION OF BLADDER TUMOR (TURBT) (N/A Bladder) CYSTOSCOPY WITH BILATRAL RETROGRADE PYELOGRAM, (Bilateral ) DIAGNOSTIC URETEROSCOPY WITH LEFT URETERAL STENT PLACEMENT (Left Ureter)  Patient location during evaluation: PACU Anesthesia Type: General Level of consciousness: awake, oriented, awake and alert and patient cooperative Pain management: pain level controlled Vital Signs Assessment: post-procedure vital signs reviewed and stable Respiratory status: spontaneous breathing, respiratory function stable, nonlabored ventilation and patient connected to face mask oxygen Cardiovascular status: stable Postop Assessment: no apparent nausea or vomiting Anesthetic complications: no     Last Vitals:  Vitals:   03/28/19 0701  BP: (!) 173/61  Pulse: 78  Resp: 20  Temp: 37.1 C  SpO2: 100%    Last Pain:  Vitals:   03/28/19 0701  TempSrc: Oral  PainSc: 0-No pain                 Arla Boutwell

## 2019-03-28 NOTE — H&P (Signed)
Urology Admission H&P  Chief Complaint: gross hematuria  History of Present Illness: Ms Badalamenti is a 86yo with gross hematuria who was found to have a bladder tumor on office cystoscopy. She continues to have gross hematuria. He has urinayr urgency, frequency, dysuria  Past Medical History:  Diagnosis Date  . Anemia   . Carotid artery disease (HCC)    2-95% RICA, patent LICA 1/88 - Dr. Scot Dock  . Cholelithiasis   . Cholelithiasis    In need of cholecystectomy  . Diabetes mellitus    Type II  . Essential hypertension, benign   . GERD (gastroesophageal reflux disease)   . GERD (gastroesophageal reflux disease)   . Mixed hyperlipidemia   . Sigmoid diverticulosis   . Sigmoid diverticulosis   . Type 2 diabetes mellitus ([place])    Past Surgical History:  Procedure Laterality Date  . CAROTID ENDARTERECTOMY  08/24/2009   Left catroid endarterectomy  . CATARACT EXTRACTION W/PHACO Left 08/17/2014   Procedure: CATARACT EXTRACTION PHACO AND INTRAOCULAR LENS PLACEMENT; CDE:  7.39;  Surgeon: Tonny Branch, MD;  Location: [facility];  Service: Ophthalmology;  Laterality: Left;  . CATARACT EXTRACTION W/PHACO Right 10/02/2014   Procedure: CATARACT EXTRACTION PHACO AND INTRAOCULAR LENS PLACEMENT RIGHT EYE CDE=5.83;  Surgeon: Tonny Branch, MD;  Location: [facility];  Service: Ophthalmology;  Laterality: Right;  . EYE SURGERY    . HIP ARTHROPLASTY Left 10/03/2014   Procedure: ARTHROPLASTY BIPOLAR HIP (HEMIARTHROPLASTY);  Surgeon: Sanjuana Kava, MD;  Location: [facility];  Service: Orthopedics;  Laterality: Left;  . Left carotid endarterectomy  7/11   Dr. Scot Dock    Home Medications:  Current Facility-Administered Medications  Medication Dose Route Frequency Provider Last Rate Last Admin  . ceFAZolin (ANCEF) IVPB 2g/100 mL premix  2 g Intravenous 30 min Pre-Op Monya Kozakiewicz, Candee Furbish, MD      . lactated ringers infusion   Intravenous Continuous Lenice Llamas, MD 50 mL/hr at 03/28/19 0711 New Bag at 03/28/19 0711    Allergies:  Allergies  Allergen Reactions  . Codeine Nausea And Vomiting    Family History  Problem Relation Age of Onset  . Diabetes Mother   . Diabetes Father   . Heart attack Daughter   . Other Daughter        Bleeding problems  . Cancer Daughter   . Coronary artery disease Other   . Heart disease Sister   . Diabetes Sister   . Hyperlipidemia Sister   . Hypertension Sister   . Heart disease Sister   . Heart disease Sister    Social History:  reports that she quit smoking about 7 years ago. Her smoking use included cigarettes. She has a 18.00 pack-year smoking history. She has never used smokeless tobacco. She reports that she does not drink alcohol or use drugs.  Review of Systems  Genitourinary: Positive for difficulty urinating and hematuria.  All other systems reviewed and are negative.   Physical Exam:  Vital signs in last 24 hours: Temp:  [98.7 F (37.1 C)] 98.7 F (37.1 C) (02/01 0701) Pulse Rate:  [78] 78 (02/01 0701) Resp:  [20] 20 (02/01 0701) BP: (173)/(61) 173/61 (02/01 0701) SpO2:  [100 %] 100 % (02/01 0701) Weight:  [68.5 kg] 68.5 kg (02/01 0701) Physical Exam  Constitutional: She is oriented to person, place, and time. She appears well-developed and well-nourished.  HENT:  Head: Normocephalic and atraumatic.  Eyes: Pupils are equal, round, and reactive to light. EOM are normal.  Neck: No thyromegaly present.  Cardiovascular: Normal rate and regular rhythm.  Respiratory: Effort normal. No respiratory distress.  GI: Soft. She exhibits no distension.  Musculoskeletal:        General: No edema. Normal range of motion.     Cervical back: Normal range of motion.  Neurological: She is alert and oriented to person, place, and time.  Skin: Skin is warm and dry.  Psychiatric: She has a normal mood and affect. Her behavior is normal. Judgment and thought content normal.    Laboratory Data:  Results for orders placed or performed during the hospital  encounter of 03/28/19 (from the past 24 hour(s))  Glucose, capillary     Status: Abnormal   Collection Time: 03/28/19  7:08 AM  Result Value Ref Range   Glucose-Capillary 150 (H) 70 - 99 mg/dL   Recent Results (from the past 240 hour(s))  SARS CORONAVIRUS 2 (TAT 6-24 HRS) Nasopharyngeal Nasopharyngeal Swab     Status: None   Collection Time: 03/25/19  7:15 AM   Specimen: Nasopharyngeal Swab  Result Value Ref Range Status   SARS Coronavirus 2 NEGATIVE NEGATIVE Final    Comment: (NOTE) SARS-CoV-2 target nucleic acids are NOT DETECTED. The SARS-CoV-2 RNA is generally detectable in upper and lower respiratory specimens during the acute phase of infection. Negative results do not preclude SARS-CoV-2 infection, do not rule out co-infections with other pathogens, and should not be used as the sole basis for treatment or other patient management decisions. Negative results must be combined with clinical observations, patient history, and epidemiological information. The expected result is Negative. Fact Sheet for Patients: SugarRoll.be Fact Sheet for Healthcare Providers: https://www.woods-mathews.com/ This test is not yet approved or cleared by the [place] FDA and  has been authorized for detection and/or diagnosis of SARS-CoV-2 by FDA under an Emergency Use Authorization (EUA). This EUA will remain  in effect (meaning this test can be used) for the duration of the COVID-19 declaration under Section 56 4(b)(1) of the Act, 21 U.S.C. section 360bbb-3(b)(1), unless the authorization is terminated or revoked sooner. Performed at [facility], [place] [address] [place], [place] [address]    Creatinine: Recent Labs    03/25/19 1404 03/25/19 1405  CREATININE 2.58* 2.53*   Baseline Creatinine: 2.5  Impression/Assessment:  86yo with bladder tumor  Plan:  The risks/benefits/alterantives to TURBT was explained to the patient and she  understands and wishes to proceed with surgery  Nicolette Bang 03/28/2019, 8:03 AM

## 2019-03-28 NOTE — Anesthesia Preprocedure Evaluation (Signed)
Anesthesia Evaluation  Patient identified by MRN, date of birth, ID band Patient awake    Reviewed: Allergy & Precautions, NPO status , Patient's Chart, lab work & pertinent test results  Airway Mallampati: II  TM Distance: >3 FB Neck ROM: Full    Dental no notable dental hx. (+) Edentulous Upper, Edentulous Lower   Pulmonary neg pulmonary ROS, former smoker,    Pulmonary exam normal breath sounds clear to auscultation       Cardiovascular Exercise Tolerance: Good hypertension, Normal cardiovascular examI+ Valvular Problems/Murmurs AS  Rhythm:Regular Rate:Normal  Cares for self  Works  Last EF 2016 ~50-55, mild AS noted at that time  Denies CP or DOE  Appears frail    Neuro/Psych negative neurological ROS  negative psych ROS   GI/Hepatic Neg liver ROS, GERD  Medicated and Controlled,  Endo/Other  negative endocrine ROSdiabetes  Renal/GU Renal InsufficiencyRenal disease  negative genitourinary   Musculoskeletal negative musculoskeletal ROS (+)   Abdominal   Peds negative pediatric ROS (+)  Hematology negative hematology ROS (+) anemia ,   Anesthesia Other Findings   Reproductive/Obstetrics negative OB ROS                             Anesthesia Physical Anesthesia Plan  ASA: III  Anesthesia Plan: General   Post-op Pain Management:    Induction: Intravenous  PONV Risk Score and Plan: 3 and Ondansetron, Dexamethasone, Treatment may vary due to age or medical condition and Promethazine  Airway Management Planned: LMA and Oral ETT  Additional Equipment:   Intra-op Plan:   Post-operative Plan:   Informed Consent: I have reviewed the patients History and Physical, chart, labs and discussed the procedure including the risks, benefits and alternatives for the proposed anesthesia with the patient or authorized representative who has indicated his/her understanding and acceptance.      Dental advisory given  Plan Discussed with: CRNA  Anesthesia Plan Comments: (Plan Full PPE use  Plan GA (LMA) with GETA as needed d/w pt -WTP with same after Q&A)        Anesthesia Quick Evaluation

## 2019-03-28 NOTE — Op Note (Signed)
Preoperative diagnosis: Bladder Tumor  Postop diagnosis: Same  Procedure: 1.  Cystoscopy 2. Bilateral retrograde pyelography 3. Intra-operative fluoroscopy, under 1 hour, with interpretation 4.Transurethral resection of bladder tumor, medium 5. Left 6x24 JJ Stent Placement     Attending: Nicolette Bang  Anesthesia: General  Estimated blood loss: 5 cc  Drains: 1. 18 French Foley catheter 2. Left 6x24 JJ ureteral Stent  Specimens: Bladder tumor  Antibiotics: Ancef  Findings: papillary tumor 3cm extedning from bladder neck t trione and involving left ureteral orifice. No hydronephrosis or filling defects in either collecting system.   Indications: Patient is a 86 year old with a history of  bladder tumor found on office cystoscopy.   After discussing treatment options patient decided to proceed with transurethral resection of bladder tumor  Procedure in detail: Prior to procedure consetn was obtained. Patient was brought to the operating room and briefing was done sure correct patient, correct procedure, correct site.  General anesthesia was in administered patient was placed in the dorsal lithotomy position.  The rigid 61 French cystoscope was passed urethra and bladder.  Bladder was inspected masses or lesions and we noted a diffuse centimeter lesion on the right lateral wall as well as a 3 cm lesion in the anterior wall of the bladder.  We then cannulated the right ureteral orifice with a 6 French ureteral catheter.  A gentle retrograde was obtained in findings noted above.  We then turned our attention to the left ureteral orifice.  The left ureteral orifice was cannulated with a 6 French ureteral catheter.  A gentle retrograde was obtained in findings noted above.  We then placed a zip wire through the ureteral catheter and advanced up to the renal pelvis. We attmepted ureteroscopy but the caliber of the ureter was too small to accomodate the semirgid ureteroscope. We then placed a 6  x 24 double-J ureteral stent over the wire.  We then removed the wire and good coil was noted in the pelvis under fluoroscopy in the bladder under direct vision.  Removed the cystoscope and placed a 3 French resectoscope in the bladder.  Using bipolar electrocautery were then removed the 2 bladder tumors.  We removed the bladder tumors down to the base exposing muscle.  We then removed the pieces and sent them for pathology.  To obtain hemostasis we then cauterized the bed of the tumors.  Once good hemostasis was noted the bladder was then drained and a 18 French Foley catheter was placed. This concluded the procedure which resulted by the patient.  Complications: None  Condition: Stable,  extubated, transferred to PACU.  Plan: The patient is to be discharged home and followup in 1 week for stent removal

## 2019-03-28 NOTE — Discharge Instructions (Signed)
Indwelling Urinary Catheter Care, Adult An indwelling urinary catheter is a thin tube that is put into your bladder. The tube helps to drain pee (urine) out of your body. The tube goes in through your urethra. Your urethra is where pee comes out of your body. Your pee will come out through the catheter, then it will go into a bag (drainage bag). Take good care of your catheter so it will work well. How to wear your catheter and bag Supplies needed  Sticky tape (adhesive tape) or a leg strap.  Alcohol wipe or soap and water (if you use tape).  A clean towel (if you use tape).  Large overnight bag.  Smaller bag (leg bag). Wearing your catheter Attach your catheter to your leg with tape or a leg strap.  Make sure the catheter is not pulled tight.  If a leg strap gets wet, take it off and put on a dry strap.  If you use tape to hold the bag on your leg: 1. Use an alcohol wipe or soap and water to wash your skin where the tape made it sticky before. 2. Use a clean towel to pat-dry that skin. 3. Use new tape to make the bag stay on your leg. Wearing your bags You should have been given a large overnight bag.  You may wear the overnight bag in the day or night.  Always have the overnight bag lower than your bladder.  Do not let the bag touch the floor.  Before you go to sleep, put a clean plastic bag in a wastebasket. Then hang the overnight bag inside the wastebasket. You should also have a smaller leg bag that fits under your clothes.  Always wear the leg bag below your knee.  Do not wear your leg bag at night. How to care for your skin and catheter Supplies needed  A clean washcloth.  Water and mild soap.  A clean towel. Caring for your skin and catheter      Clean the skin around your catheter every day: 1. Wash your hands with soap and water. 2. Wet a clean washcloth in warm water and mild soap. 3. Clean the skin around your urethra.  If you are  female:  Gently spread the folds of skin around your vagina (labia).  With the washcloth in your other hand, wipe the inner side of your labia on each side. Wipe from front to back.  If you are female:  Pull back any skin that covers the end of your penis (foreskin).  With the washcloth in your other hand, wipe your penis in small circles. Start wiping at the tip of your penis, then move away from the catheter.  Move the foreskin back in place, if needed. 4. With your free hand, hold the catheter close to where it goes into your body.  Keep holding the catheter during cleaning so it does not get pulled out. 5. With the washcloth in your other hand, clean the catheter.  Only wipe downward on the catheter.  Do not wipe upward toward your body. Doing this may push germs into your urethra and cause infection. 6. Use a clean towel to pat-dry the catheter and the skin around it. Make sure to wipe off all soap. 7. Wash your hands with soap and water.  Shower every day. Do not take baths.  Do not use cream, ointment, or lotion on the area where the catheter goes into your body, unless your doctor tells you   to.  Do not use powders, sprays, or lotions on your genital area.  Check your skin around the catheter every day for signs of infection. Check for: ? Redness, swelling, or pain. ? Fluid or blood. ? Warmth. ? Pus or a bad smell. How to empty the bag Supplies needed  Rubbing alcohol.  Gauze pad or cotton ball.  Tape or a leg strap. Emptying the bag Pour the pee out of your bag when it is ?- full, or at least 2-3 times a day. Do this for your overnight bag and your leg bag. 1. Wash your hands with soap and water. 2. Separate (detach) the bag from your leg. 3. Hold the bag over the toilet or a clean pail. Keep the bag lower than your hips and bladder. This is so the pee (urine) does not go back into the tube. 4. Open the pour spout. It is at the bottom of the bag. 5. Empty the  pee into the toilet or pail. Do not let the pour spout touch any surface. 6. Put rubbing alcohol on a gauze pad or cotton ball. 7. Use the gauze pad or cotton ball to clean the pour spout. 8. Close the pour spout. 9. Attach the bag to your leg with tape or a leg strap. 10. Wash your hands with soap and water. Follow instructions for cleaning the drainage bag:  From the product maker.  As told by your doctor. How to change the bag Supplies needed  Alcohol wipes.  A clean bag.  Tape or a leg strap. Changing the bag Replace your bag when it starts to leak, smell bad, or look dirty. 1. Wash your hands with soap and water. 2. Separate the dirty bag from your leg. 3. Pinch the catheter with your fingers so that pee does not spill out. 4. Separate the catheter tube from the bag tube where these tubes connect (at the connection valve). Do not let the tubes touch any surface. 5. Clean the end of the catheter tube with an alcohol wipe. Use a different alcohol wipe to clean the end of the bag tube. 6. Connect the catheter tube to the tube of the clean bag. 7. Attach the clean bag to your leg with tape or a leg strap. Do not make the bag tight on your leg. 8. Wash your hands with soap and water. General rules   Never pull on your catheter. Never try to take it out. Doing that can hurt you.  Always wash your hands before and after you touch your catheter or bag. Use a mild, fragrance-free soap. If you do not have soap and water, use hand sanitizer.  Always make sure there are no twists or bends (kinks) in the catheter tube.  Always make sure there are no leaks in the catheter or bag.  Drink enough fluid to keep your pee pale yellow.  Do not take baths, swim, or use a hot tub.  If you are female, wipe from front to back after you poop (have a bowel movement). Contact a doctor if:  Your pee is cloudy.  Your pee smells worse than usual.  Your catheter gets clogged.  Your catheter  leaks.  Your bladder feels full. Get help right away if:  You have redness, swelling, or pain where the catheter goes into your body.  You have fluid, blood, pus, or a bad smell coming from the area where the catheter goes into your body.  Your skin feels warm where   the catheter goes into your body.  You have a fever.  You have pain in your: ? Belly (abdomen). ? Legs. ? Lower back. ? Bladder.  You see blood in the catheter.  Your pee is pink or red.  You feel sick to your stomach (nauseous).  You throw up (vomit).  You have chills.  Your pee is not draining into the bag.  Your catheter gets pulled out. Summary  An indwelling urinary catheter is a thin tube that is placed into the bladder to help drain pee (urine) out of the body.  The catheter is placed into the part of the body that drains pee from the bladder (urethra).  Taking good care of your catheter will keep it working properly and help prevent problems.  Always wash your hands before and after touching your catheter or bag.  Never pull on your catheter or try to take it out. This information is not intended to replace advice given to you by your health care provider. Make sure you discuss any questions you have with your health care provider. Document Revised: 06/04/2018 Document Reviewed: 09/26/2016 Elsevier Patient Education  2020 [employer]  

## 2019-03-28 NOTE — Anesthesia Procedure Notes (Signed)
Procedure Name: Intubation Date/Time: 03/28/2019 8:22 AM Performed by: Jonna Munro, CRNA Pre-anesthesia Checklist: Patient identified, Emergency Drugs available, Suction available, Patient being monitored and Timeout performed Patient Re-evaluated:Patient Re-evaluated prior to induction Oxygen Delivery Method: Circle system utilized Preoxygenation: Pre-oxygenation with 100% oxygen Induction Type: IV induction Ventilation: Mask ventilation without difficulty Laryngoscope Size: Mac and 3 Grade View: Grade I Tube type: Oral Tube size: 7.0 mm Number of attempts: 1 Airway Equipment and Method: Stylet Placement Confirmation: ETT inserted through vocal cords under direct vision,  positive ETCO2 and breath sounds checked- equal and bilateral Secured at: 22 cm Tube secured with: Tape Dental Injury: Teeth and Oropharynx as per pre-operative assessment

## 2019-03-28 NOTE — Transfer of Care (Signed)
Immediate Anesthesia Transfer of Care Note  Patient: Marie Hunt  Procedure(s) Performed: TRANSURETHRAL RESECTION OF BLADDER TUMOR (TURBT) (N/A Bladder) CYSTOSCOPY WITH BILATRAL RETROGRADE PYELOGRAM, (Bilateral ) DIAGNOSTIC URETEROSCOPY WITH LEFT URETERAL STENT PLACEMENT (Left Ureter)  Patient Location: PACU  Anesthesia Type:General  Level of Consciousness: awake, alert  and oriented  Airway & Oxygen Therapy: Patient Spontanous Breathing and Patient connected to face mask oxygen  Post-op Assessment: Report given to RN and Post -op Vital signs reviewed and stable  Post vital signs: Reviewed and stable  Last Vitals:  Vitals Value Taken Time  BP    Temp    Pulse 81 03/28/19 0920  Resp 11 03/28/19 0920  SpO2 100 % 03/28/19 0920  Vitals shown include unvalidated device data.  Last Pain:  Vitals:   03/28/19 0701  TempSrc: Oral  PainSc: 0-No pain      Patients Stated Pain Goal: 5 (72/09/47 0962)  Complications: No apparent anesthesia complications

## 2019-03-29 LAB — SURGICAL PATHOLOGY

## 2019-04-04 ENCOUNTER — Encounter (HOSPITAL_COMMUNITY): Payer: Self-pay | Admitting: Emergency Medicine

## 2019-04-04 ENCOUNTER — Other Ambulatory Visit: Payer: Self-pay

## 2019-04-04 ENCOUNTER — Emergency Department (HOSPITAL_COMMUNITY)
Admission: EM | Admit: 2019-04-04 | Discharge: 2019-04-04 | Discharge status: Against Medical Advice | Payer: Medicare Other | Attending: Emergency Medicine | Admitting: Emergency Medicine

## 2019-04-04 DIAGNOSIS — Z436 Encounter for attention to other artificial openings of urinary tract: Secondary | ICD-10-CM | POA: Diagnosis present

## 2019-04-04 DIAGNOSIS — I10 Essential (primary) hypertension: Secondary | ICD-10-CM | POA: Insufficient documentation

## 2019-04-04 DIAGNOSIS — Z7984 Long term (current) use of oral hypoglycemic drugs: Secondary | ICD-10-CM | POA: Insufficient documentation

## 2019-04-04 DIAGNOSIS — Z7982 Long term (current) use of aspirin: Secondary | ICD-10-CM | POA: Insufficient documentation

## 2019-04-04 DIAGNOSIS — R300 Dysuria: Secondary | ICD-10-CM | POA: Diagnosis not present

## 2019-04-04 DIAGNOSIS — Z5329 Procedure and treatment not carried out because of patient's decision for other reasons: Secondary | ICD-10-CM | POA: Insufficient documentation

## 2019-04-04 DIAGNOSIS — Z87891 Personal history of nicotine dependence: Secondary | ICD-10-CM | POA: Insufficient documentation

## 2019-04-04 DIAGNOSIS — E119 Type 2 diabetes mellitus without complications: Secondary | ICD-10-CM | POA: Insufficient documentation

## 2019-04-04 DIAGNOSIS — Z79899 Other long term (current) drug therapy: Secondary | ICD-10-CM | POA: Insufficient documentation

## 2019-04-04 LAB — URINALYSIS, ROUTINE W REFLEX MICROSCOPIC
Bilirubin Urine: NEGATIVE
Glucose, UA: NEGATIVE mg/dL
Ketones, ur: NEGATIVE mg/dL
Nitrite: NEGATIVE
Protein, ur: 100 mg/dL — AB
RBC / HPF: >50 RBC/hpf — ABNORMAL HIGH (ref 0–5)
Specific Gravity, Urine: 1.011 (ref 1.005–1.030)
WBC, UA: >50 WBC/hpf — ABNORMAL HIGH (ref 0–5)
pH: 6 (ref 5.0–8.0)

## 2019-04-04 LAB — CBG MONITORING, ED: Glucose-Capillary: 141 mg/dL — ABNORMAL HIGH (ref 70–99)

## 2019-04-04 NOTE — ED Provider Notes (Signed)
[facility] Provider Note   CSN: [id number] Arrival date & time: 04/04/19  1553     History Chief Complaint  Patient presents with  . foley leaking    Marie Hunt is a 86 y.o. female.  Patient has a Foley in from surgery.  She complains of burning at the catheter point.  She was post to get it taken out today but the doctor changed her appointment till Wednesday.  The history is provided by the patient. No language interpreter was used.  Dysuria Pain quality:  Aching Pain severity:  Mild Onset quality:  Sudden Timing:  Constant Progression:  Waxing and waning Chronicity:  New Recent urinary tract infections: no   Relieved by:  Nothing Worsened by:  Nothing Associated symptoms: no abdominal pain        Past Medical History:  Diagnosis Date  . Anemia   . Carotid artery disease (HCC)    5-00% RICA, patent LICA 9/38 - Dr. Scot Dock  . Cholelithiasis   . Cholelithiasis    In need of cholecystectomy  . Diabetes mellitus    Type II  . Essential hypertension, benign   . GERD (gastroesophageal reflux disease)   . GERD (gastroesophageal reflux disease)   . Mixed hyperlipidemia   . Sigmoid diverticulosis   . Sigmoid diverticulosis   . Type 2 diabetes mellitus [facility])     Patient Active Problem List   Diagnosis Date Noted  . Colitis, acute 04/24/2016  . Cough 10/17/2014  . Renal insufficiency 10/17/2014  . UTI (urinary tract infection) 10/07/2014  . Anemia 10/07/2014  . Fever   . Type 2 diabetes mellitus without complication ([place])   . Hip fracture ([place]) 10/02/2014  . Hip fx ([place]) 10/02/2014  . Type 2 diabetes mellitus ([place])   . Fall   . Aftercare following surgery of the circulatory system, [place] 10/26/2013  . Occlusion and stenosis of carotid artery without mention of cerebral infarction 09/17/2011  . Preoperative evaluation to rule out surgical contraindication 11/15/2010  . Cardiac murmur 11/15/2010  . Hypertension 11/08/2010  .  Carotid artery disease ([place]) 11/08/2010  . Diabetes in pregnancy 11/08/2010  . Hyperlipidemia LDL goal < 100 11/08/2010  . Cholelithiasis 11/08/2010    Past Surgical History:  Procedure Laterality Date  . CAROTID ENDARTERECTOMY  08/24/2009   Left catroid endarterectomy  . CATARACT EXTRACTION W/PHACO Left 08/17/2014   Procedure: CATARACT EXTRACTION PHACO AND INTRAOCULAR LENS PLACEMENT; CDE:  7.39;  Surgeon: Tonny Branch, MD;  Location: [facility];  Service: Ophthalmology;  Laterality: Left;  . CATARACT EXTRACTION W/PHACO Right 10/02/2014   Procedure: CATARACT EXTRACTION PHACO AND INTRAOCULAR LENS PLACEMENT RIGHT EYE CDE=5.83;  Surgeon: Tonny Branch, MD;  Location: [facility];  Service: Ophthalmology;  Laterality: Right;  . CYSTOSCOPY W/ RETROGRADES Bilateral 03/28/2019   Procedure: [place],;  Surgeon: Cleon Gustin, MD;  Location: [facility];  Service: Urology;  Laterality: Bilateral;  . EYE SURGERY    . HIP ARTHROPLASTY Left 10/03/2014   Procedure: ARTHROPLASTY BIPOLAR HIP (HEMIARTHROPLASTY);  Surgeon: Sanjuana Kava, MD;  Location: [facility];  Service: Orthopedics;  Laterality: Left;  . Left carotid endarterectomy  7/11   Dr. Scot Dock  . TRANSURETHRAL RESECTION OF BLADDER TUMOR N/A 03/28/2019   Procedure: TRANSURETHRAL RESECTION OF BLADDER TUMOR (TURBT);  Surgeon: Cleon Gustin, MD;  Location: [facility];  Service: Urology;  Laterality: N/A;  . URETEROSCOPY Left 03/28/2019   Procedure: DIAGNOSTIC URETEROSCOPY WITH LEFT URETERAL STENT PLACEMENT;  Surgeon: Alyson Ingles,  Candee Furbish, MD;  Location: [facility];  Service: Urology;  Laterality: Left;     OB History   No obstetric history on file.     Family History  Problem Relation Age of Onset  . Diabetes Mother   . Diabetes Father   . Heart attack Daughter   . Other Daughter        Bleeding problems  . Cancer Daughter   . Coronary artery disease Other   . Heart disease Sister   . Diabetes Sister   . Hyperlipidemia Sister    . Hypertension Sister   . Heart disease Sister   . Heart disease Sister     Social History   Tobacco Use  . Smoking status: Former Smoker    Packs/day: 0.30    Years: 60.00    Pack years: 18.00    Types: Cigarettes    Quit date: 10/27/2011    Years since quitting: 7.4  . Smokeless tobacco: Never Used  Substance Use Topics  . Alcohol use: No    Alcohol/week: 0.0 standard drinks  . Drug use: No    Home Medications Prior to Admission medications   Medication Sig Start Date End Date Taking? Authorizing Provider  allopurinol (ZYLOPRIM) 100 MG tablet Take 100 mg by mouth every morning.    Yes [provider]  amLODipine (NORVASC) 5 MG tablet Take 5 mg by mouth every morning.    Yes [provider]  aspirin EC 81 MG tablet Take 81 mg by mouth every evening.    Yes [provider]  Biotin 1000 MCG tablet Take 1,000 mcg by mouth every evening.    Yes [provider]  diclofenac Sodium (VOLTAREN) 1 % GEL Apply 2 g topically daily as needed (for pain).  03/25/19  Yes [provider]  diphenhydrAMINE (BENADRYL) 2 % cream Apply 1 application topically 3 (three) times daily as needed for itching (rash).   Yes [provider]  famotidine (PEPCID) 20 MG tablet Take 20 mg by mouth at bedtime.    Yes [provider]  folic acid (FOLVITE) 169 MCG tablet Take 400 mcg by mouth every morning.    Yes [provider]  furosemide (LASIX) 40 MG tablet Take 1 tablet (40 mg total) by mouth daily as needed for fluid. Patient taking differently: Take 40 mg by mouth 2 (two) times daily.  10/07/14  Yes Memon, Jolaine Artist, MD  glimepiride (AMARYL) 2 MG tablet Take 2 mg by mouth daily before breakfast.     Yes [provider]  levothyroxine (SYNTHROID, LEVOTHROID) 25 MCG tablet Take 25 mcg by mouth daily before breakfast.   Yes [provider]  metoprolol succinate (TOPROL-XL) 25 MG 24 hr tablet Take 25 mg by mouth every morning.     Yes [provider]  PAZEO 0.7 % SOLN Place 1 drop into both eyes daily. 03/27/16  Yes [provider]  predniSONE (DELTASONE) 5 MG tablet Take 5-30 mg by mouth See admin instructions. Take as directed per package instructions starting after surgical procedure   Yes [provider]  simvastatin (ZOCOR) 20 MG tablet Take 20 mg by mouth at bedtime.     Yes [provider]  traMADol (ULTRAM) 50 MG tablet Take 1 tablet (50 mg total) by mouth every 6 (six) hours as needed. 03/28/19 03/27/20 Yes McKenzie, Candee Furbish, MD  vitamin B-12 (CYANOCOBALAMIN) 1000 MCG tablet Take 1,000 mcg by mouth every morning.    Yes [provider]  Allergies    Codeine  Review of Systems   Review of Systems  Constitutional: Negative for appetite change and fatigue.  HENT: Negative for congestion, ear discharge and sinus pressure.   Eyes: Negative for discharge.  Respiratory: Negative for cough.   Cardiovascular: Negative for chest pain.  Gastrointestinal: Negative for abdominal pain and diarrhea.  Genitourinary: Positive for dysuria. Negative for frequency and hematuria.  Musculoskeletal: Negative for back pain.  Skin: Negative for rash.  Neurological: Negative for seizures and headaches.  Psychiatric/Behavioral: Negative for hallucinations.    Physical Exam Updated Vital Signs BP (!) 152/65 (BP Location: Right Arm)   Pulse 71   Temp 98.5 F (36.9 C) (Oral)   Resp 20   Ht 5\' 2"  (1.575 m)   Wt 68.5 kg   SpO2 97%   BMI 27.62 kg/m   Physical Exam Vitals and nursing note reviewed.  Constitutional:      Appearance: She is well-developed.  HENT:     Head: Normocephalic.     Nose: Nose normal.  Eyes:     General: No scleral icterus.    Conjunctiva/sclera: Conjunctivae normal.  Neck:     Thyroid: No thyromegaly.  Cardiovascular:     Rate and Rhythm: Normal rate and regular rhythm.     Heart sounds: No murmur. No friction rub. No gallop.   Pulmonary:      Breath sounds: No stridor. No wheezing or rales.  Chest:     Chest wall: No tenderness.  Abdominal:     General: There is no distension.     Tenderness: There is no abdominal tenderness. There is no rebound.     Comments: Foley in place  Musculoskeletal:        General: Normal range of motion.     Cervical back: Neck supple.  Lymphadenopathy:     Cervical: No cervical adenopathy.  Skin:    Findings: No erythema or rash.  Neurological:     Mental Status: She is alert and oriented to person, place, and time.     Motor: No abnormal muscle tone.     Coordination: Coordination normal.  Psychiatric:        Behavior: Behavior normal.     ED Results / Procedures / Treatments   Labs (all labs ordered are listed, but only abnormal results are displayed) Labs Reviewed  URINALYSIS, ROUTINE W REFLEX MICROSCOPIC - Abnormal; Notable for the following components:      Result Value   APPearance HAZY (*)    Hgb urine dipstick LARGE (*)    Protein, ur 100 (*)    Leukocytes,Ua MODERATE (*)    RBC / HPF >50 (*)    WBC, UA >50 (*)    Bacteria, UA RARE (*)    All other components within normal limits  CBG MONITORING, ED - Abnormal; Notable for the following components:   Glucose-Capillary 141 (*)    All other components within normal limits    EKG None  Radiology No results found.  Procedures Procedures (including critical care time)  Medications Ordered in ED Medications - No data to display  ED Course  I have reviewed the triage vital signs and the nursing notes.  Pertinent labs & imaging results that were available during my care of the patient were reviewed by me and considered in my medical decision making (see chart for details).    MDM Rules/Calculators/A&P  Urine shows urinary tract infection.  Patient left AMA before I was able to discuss treatment plan Final Clinical Impression(s) / ED Diagnoses Final diagnoses:  None    Rx / DC Orders ED  Discharge Orders    None       Milton Ferguson, MD 04/04/19 2050

## 2019-04-04 NOTE — ED Notes (Signed)
Pt urinated on her own without catheter, EDP notified.

## 2019-04-04 NOTE — ED Notes (Signed)
Pt states she only had breakfast today and felt her blood sugar was dropping. Pt's cbg was 141.

## 2019-04-04 NOTE — ED Notes (Signed)
Per EDP I was supposed to remove catheter and not replace it. Pt was supposed to have removed today by urologist, but had to cancel appt.

## 2019-04-04 NOTE — ED Triage Notes (Signed)
Pt had bladder surgery one week ago.  C/o foley leaking, burning and pressure on bladder.  C/o pain 6/10.  Foley with blood-tinged urine.

## 2019-04-04 NOTE — ED Notes (Signed)
Pt's came walking out of room stating she was tired and hungry and going home, EDP notified. Pt and family member would not sign AMA paperwork.

## 2019-04-06 ENCOUNTER — Encounter: Payer: Self-pay | Admitting: Urology

## 2019-04-06 ENCOUNTER — Ambulatory Visit (INDEPENDENT_AMBULATORY_CARE_PROVIDER_SITE_OTHER): Payer: Medicare Other | Admitting: Urology

## 2019-04-06 ENCOUNTER — Other Ambulatory Visit (HOSPITAL_COMMUNITY)
Admission: RE | Admit: 2019-04-06 | Discharge: 2019-04-06 | Disposition: A | Discharge status: Home / Self Care | Payer: Medicare Other | Source: Ambulatory Visit | Attending: Urology | Admitting: Urology

## 2019-04-06 ENCOUNTER — Other Ambulatory Visit: Payer: Self-pay

## 2019-04-06 VITALS — BP 138/74 | HR 76 | Temp 97.0°F | Ht 61.0 in | Wt 145.5 lb

## 2019-04-06 DIAGNOSIS — N3001 Acute cystitis with hematuria: Secondary | ICD-10-CM | POA: Diagnosis not present

## 2019-04-06 DIAGNOSIS — C675 Malignant neoplasm of bladder neck: Secondary | ICD-10-CM | POA: Diagnosis not present

## 2019-04-06 DIAGNOSIS — N3289 Other specified disorders of bladder: Secondary | ICD-10-CM | POA: Diagnosis not present

## 2019-04-06 LAB — POCT URINALYSIS DIPSTICK
Bilirubin, UA: NEGATIVE
Glucose, UA: NEGATIVE
Ketones, UA: NEGATIVE
Nitrite, UA: NEGATIVE
Protein, UA: POSITIVE — AB
Spec Grav, UA: 1.025 (ref 1.010–1.025)
Urobilinogen, UA: NEGATIVE E.U./dL — AB
pH, UA: 6 (ref 5.0–8.0)

## 2019-04-06 MED ORDER — FLUCONAZOLE 150 MG PO TABS: 150.0000 mg | ORAL_TABLET | Freq: Every day | ORAL | 0 refills | Status: DC

## 2019-04-06 MED ORDER — CEFUROXIME AXETIL 250 MG PO TABS: 250.0000 mg | ORAL_TABLET | Freq: Two times a day (BID) | ORAL | 0 refills | Status: DC

## 2019-04-06 NOTE — Progress Notes (Signed)
Urological Symptom Review  Patient is experiencing the following symptoms: Burning/painful urination Getting up at night Leakage Blood in urine Review of Systems  Gastrointestinal (upper)  : Negative for upper GI symptoms  Gastrointestinal (lower) : Negative for lower GI symptoms  Constitutional : Negative for symptoms  Skin: Skin rash/lesion Itching  Eyes: Negative for eye symptoms  Ear/Nose/Throat : Negative for Ear/Nose/Throat symptoms  Hematologic/Lymphatic: Negative for Hematologic/Lymphatic symptoms  Cardiovascular : Negative for cardiovascular symptoms  Respiratory : Negative for respiratory symptoms  Endocrine: Negative for endocrine symptoms  Musculoskeletal: Negative for musculoskeletal symptoms  Neurological: Negative for neurological symptoms  Psychologic: Negative for psychiatric symptoms

## 2019-04-06 NOTE — Patient Instructions (Signed)
Bladder Cancer  Bladder cancer is an abnormal growth of tissue in the bladder. The bladder is the balloon-like sac in the pelvis. It collects and stores urine that comes from the kidneys through the ureters. The bladder wall is made of layers. If cancer spreads into these layers and through the wall of the bladder, it becomes more difficult to treat. What are the causes? The cause of this condition is not known. What increases the risk? The following factors may make you more likely to develop this condition:  Smoking.  Workplace risks (occupational exposures), such as rubber, leather, textile, dyes, chemicals, and paint.  Being white.  Your age. Most people with bladder cancer are over the age of 55.  Being female.  Having chronic bladder inflammation.  Having a personal history of bladder cancer.  Having a family history of bladder cancer (heredity).  Having had chemotherapy or radiation therapy to the pelvis.  Having been exposed to arsenic. What are the signs or symptoms? Initial symptoms of this condition include:  Blood in the urine.  Painful urination.  Frequent bladder or urine infections.  Increase in urgency and frequency of urination. Advanced symptoms of this condition include:  Not being able to urinate.  Low back pain on one side.  Loss of appetite.  Weight loss.  Fatigue.  Swelling in the feet.  Bone pain. How is this diagnosed? This condition is diagnosed based on your medical history, a physical exam, urine tests, lab tests, imaging tests, and your symptoms. You may also have other tests or procedures done, such as:  A narrow tube being inserted into your bladder through your urethra (cystoscopy) in order to view the lining of your bladder for tumors.  A biopsy to sample the tumor to see if cancer is present. If cancer is present, it will then be staged to determine its severity and extent. Staging is an assessment of:  The size of the  tumor.  Whether the cancer has spread.  Where the cancer has spread. It is important to know how deeply into the bladder wall cancer has grown and whether cancer has spread to any other parts of your body. Staging may require blood tests or imaging tests, such as a CT scan, MRI, bone scan, or chest X-ray. How is this treated? Based on the stage of cancer, one treatment or a combination of treatments may be recommended. The most common forms of treatment are:  Surgery to remove the cancer. Procedures that may be done include transurethral resection and cystectomy.  Radiation therapy. This is high-energy X-rays or other particles. This is often used in combination with chemotherapy.  Chemotherapy. During this treatment, medicines are used to kill cancer cells.  Immunotherapy. This uses medicines to help your own immune system destroy cancer cells. Follow these instructions at home:  Take over-the-counter and prescription medicines only as told by your health care provider.  Maintain a healthy diet. Some of your treatments might affect your appetite.  Consider joining a support group. This may help you learn to cope with the stress of having bladder cancer.  Tell your cancer care team if you develop side effects. They may be able to recommend ways to relieve them.  Keep all follow-up visits as told by your health care provider. This is important. Where to find more information  American Cancer Society: www.cancer.org  National Cancer Institute (NCI): www.cancer.gov Contact a health care provider if:  You have symptoms of a urinary tract infection. These include: ?   Fever. ? Chills. ? Weakness. ? Muscle aches. ? Abdominal pain. ? Frequent and intense urge to urinate. ? Burning feeling in the bladder or urethra during urination. Get help right away if:  There is blood in your urine.  You cannot urinate.  You have severe pain or other symptoms that do not go  away. Summary  Bladder cancer is an abnormal growth of tissue in the bladder.  This condition is diagnosed based on your medical history, a physical exam, urine tests, lab tests, imaging tests, and your symptoms.  Based on the stage of cancer, surgery, chemotherapy, or a combination of treatments may be recommended.  Consider joining a support group. This may help you learn to cope with the stress of having bladder cancer. This information is not intended to replace advice given to you by your health care provider. Make sure you discuss any questions you have with your health care provider. Document Revised: 01/23/2017 Document Reviewed: 01/15/2016 Elsevier Patient Education  2020 Elsevier Inc.  

## 2019-04-06 NOTE — Progress Notes (Signed)
04/06/2019 10:08 AM   Marie Hunt Orlando Penner 1932-10-29 [id number]  Referring provider: Monico Blitz, MD [address] [place],  [place] [address]  Dysuria  HPI: Ms Knipp is a [phone] here for followup after TURBT She underwent TURBT 1 week and pathology was TaG3 with no muscle involvement. She has a left ureteral stent in place. She has severe LUTS. Seen Monday in ER and foley removed. No antibiotic given   PMH: Past Medical History:  Diagnosis Date  . Anemia   . Carotid artery disease (HCC)    9-41% RICA, patent LICA 7/40 - Dr. Scot Dock  . Cholelithiasis   . Cholelithiasis    In need of cholecystectomy  . Diabetes mellitus    Type II  . Essential hypertension, benign   . GERD (gastroesophageal reflux disease)   . GERD (gastroesophageal reflux disease)   . Mixed hyperlipidemia   . Sigmoid diverticulosis   . Sigmoid diverticulosis   . Type 2 diabetes mellitus [facility])     Surgical History: Past Surgical History:  Procedure Laterality Date  . CAROTID ENDARTERECTOMY  08/24/2009   Left catroid endarterectomy  . CATARACT EXTRACTION W/PHACO Left 08/17/2014   Procedure: CATARACT EXTRACTION PHACO AND INTRAOCULAR LENS PLACEMENT; CDE:  7.39;  Surgeon: Tonny Branch, MD;  Location: [facility];  Service: Ophthalmology;  Laterality: Left;  . CATARACT EXTRACTION W/PHACO Right 10/02/2014   Procedure: CATARACT EXTRACTION PHACO AND INTRAOCULAR LENS PLACEMENT RIGHT EYE CDE=5.83;  Surgeon: Tonny Branch, MD;  Location: [facility];  Service: Ophthalmology;  Laterality: Right;  . CYSTOSCOPY W/ RETROGRADES Bilateral 03/28/2019   Procedure: [place],;  Surgeon: Cleon Gustin, MD;  Location: [facility];  Service: Urology;  Laterality: Bilateral;  . EYE SURGERY    . HIP ARTHROPLASTY Left 10/03/2014   Procedure: ARTHROPLASTY BIPOLAR HIP (HEMIARTHROPLASTY);  Surgeon: Sanjuana Kava, MD;  Location: [facility];  Service: Orthopedics;  Laterality: Left;  . Left carotid endarterectomy  7/11   Dr. Scot Dock  . TRANSURETHRAL RESECTION OF BLADDER TUMOR N/A 03/28/2019   Procedure: TRANSURETHRAL RESECTION OF BLADDER TUMOR (TURBT);  Surgeon: Cleon Gustin, MD;  Location: [facility];  Service: Urology;  Laterality: N/A;  . URETEROSCOPY Left 03/28/2019   Procedure: DIAGNOSTIC URETEROSCOPY WITH LEFT URETERAL STENT PLACEMENT;  Surgeon: Cleon Gustin, MD;  Location: [facility];  Service: Urology;  Laterality: Left;    Home Medications:  Allergies as of 04/06/2019      Reactions   Codeine Nausea And Vomiting      Medication List       Accurate as of April 06, 2019 10:08 AM. If you have any questions, ask your nurse or doctor.        allopurinol 100 MG tablet Commonly known as: ZYLOPRIM Take 100 mg by mouth every morning.   amLODipine 5 MG tablet Commonly known as: NORVASC Take 5 mg by mouth every morning.   aspirin EC 81 MG tablet Take 81 mg by mouth every evening.   Biotin 1000 MCG tablet Take 1,000 mcg by mouth every evening.   diclofenac Sodium 1 % Gel Commonly known as: VOLTAREN Apply 2 g topically daily as needed (for pain).   diphenhydrAMINE 2 % cream Commonly known as: BENADRYL Apply 1 application topically 3 (three) times daily as needed for itching (rash).   famotidine 20 MG tablet Commonly known as: PEPCID Take 20 mg by mouth at bedtime.   folic acid 814 MCG tablet Commonly known as: FOLVITE Take 400 mcg by mouth every morning.  furosemide 40 MG tablet Commonly known as: LASIX Take 1 tablet (40 mg total) by mouth daily as needed for fluid. What changed: when to take this   glimepiride 2 MG tablet Commonly known as: AMARYL Take 2 mg by mouth daily before breakfast.   levothyroxine 25 MCG tablet Commonly known as: SYNTHROID Take 25 mcg by mouth daily before breakfast.   metoprolol succinate 25 MG 24 hr tablet Commonly known as: TOPROL-XL Take 25 mg by mouth every morning.   Pazeo 0.7 % Soln Generic drug: Olopatadine HCl Place 1 drop into  both eyes daily.   predniSONE 5 MG tablet Commonly known as: DELTASONE Take 5-30 mg by mouth See admin instructions. Take as directed per package instructions starting after surgical procedure   simvastatin 20 MG tablet Commonly known as: ZOCOR Take 20 mg by mouth at bedtime.   traMADol 50 MG tablet Commonly known as: Ultram Take 1 tablet (50 mg total) by mouth every 6 (six) hours as needed.   vitamin B-12 1000 MCG tablet Commonly known as: CYANOCOBALAMIN Take 1,000 mcg by mouth every morning.       Allergies:  Allergies  Allergen Reactions  . Codeine Nausea And Vomiting    Family History: Family History  Problem Relation Age of Onset  . Diabetes Mother   . Diabetes Father   . Heart attack Daughter   . Other Daughter        Bleeding problems  . Cancer Daughter   . Coronary artery disease Other   . Heart disease Sister   . Diabetes Sister   . Hyperlipidemia Sister   . Hypertension Sister   . Heart disease Sister   . Heart disease Sister     Social History:  reports that she quit smoking about 7 years ago. Her smoking use included cigarettes. She has a 18.00 pack-year smoking history. She has never used smokeless tobacco. She reports that she does not drink alcohol or use drugs.  ROS: All other review of systems were reviewed and are negative except what is noted above in HPI  Physical Exam: BP 138/74   Pulse 76   Temp (!) 97 F (36.1 C)   Ht 5\' 1"  (1.549 m)   Wt 145 lb 8 oz (66 kg)   BMI 27.49 kg/m   Constitutional:  Alert and oriented, No acute distress. HEENT: [place] AT, moist mucus membranes.  Trachea midline, no masses. Cardiovascular: No clubbing, cyanosis, or edema. Respiratory: Normal respiratory effort, no increased work of breathing. GI: Abdomen is soft, nontender, nondistended, no abdominal masses GU: No CVA tenderness Lymph: No cervical or inguinal lymphadenopathy. Skin: No rashes, bruises or suspicious lesions. Neurologic: Grossly intact, no  focal deficits, moving all 4 extremities. Psychiatric: Normal mood and affect.  Laboratory Data: Lab Results  Component Value Date   WBC 9.7 03/25/2019   HGB 11.3 (L) 03/25/2019   HCT 35.8 (L) 03/25/2019   MCV 96.5 03/25/2019   PLT 182 03/25/2019    Lab Results  Component Value Date   CREATININE 2.53 (H) 03/25/2019    No results found for: PSA  No results found for: TESTOSTERONE  Lab Results  Component Value Date   HGBA1C 7.0 (H) 03/25/2019    Urinalysis    Component Value Date/Time   COLORURINE YELLOW 04/04/2019 1946   APPEARANCEUR HAZY (A) 04/04/2019 1946   LABSPEC 1.011 04/04/2019 1946   PHURINE 6.0 04/04/2019 1946   GLUCOSEU NEGATIVE 04/04/2019 1946   HGBUR LARGE (A) 04/04/2019 1946  BILIRUBINUR neg 04/06/2019 [place] 04/04/2019 1946   PROTEINUR Positive (A) 04/06/2019 0925   PROTEINUR 100 (A) 04/04/2019 1946   UROBILINOGEN negative (A) 04/06/2019 0925   UROBILINOGEN 0.2 10/06/2014 1505   NITRITE neg 04/06/2019 0925   NITRITE NEGATIVE 04/04/2019 1946   LEUKOCYTESUR Moderate (2+) (A) 04/06/2019 0925   LEUKOCYTESUR MODERATE (A) 04/04/2019 1946    Lab Results  Component Value Date   BACTERIA RARE (A) 04/04/2019    Pertinent Imaging:  No results found for this or any previous visit. No results found for this or any previous visit. No results found for this or any previous visit. No results found for this or any previous visit. Results for orders placed during the hospital encounter of 01/06/19  US RENAL   Narrative CLINICAL DATA:  Stage 4 chronic kidney disease.  Diabetes.  EXAM: RENAL / URINARY TRACT ULTRASOUND COMPLETE  COMPARISON:  None.  FINDINGS: Right Kidney:  Renal measurements: 9.7 x 4.2 x 6.0 cm = volume: 127 mL. Diffusely increased renal parenchymal echogenicity is demonstrated. No mass or hydronephrosis visualized.  Left Kidney:  Renal measurements: 10.5 x 4.5 x 3.9 cm = volume: 96 mL. Diffusely increased  renal parenchymal echogenicity noted. No mass or hydronephrosis visualized.  Bladder:  A mural polypoid soft tissue density is seen which measures 3.0 x 1.9 x 1.2 cm, and shows internal blood flow on color Doppler ultrasound. This is suspicious for bladder carcinoma.  Other:  None.  IMPRESSION: Increased renal parenchymal echogenicity, consistent with medical renal disease.  No evidence of renal mass or hydronephrosis.  3 cm polypoid soft tissue density involving the bladder wall, highly suspicious for bladder carcinoma. Consider cystoscopy for further evaluation.   Electronically Signed   By: Marlaine Hind M.D.   On: 01/06/2019 08:08    No results found for this or any previous visit. No results found for this or any previous visit. No results found for this or any previous visit.  Assessment & Plan:    1. Bladder mass -confirmed high grade bladder cancer - POCT urinalysis dipstick  2. Acute cystitis with hematuria -urine for culture, will call with results -ceftin 250mg  BID for 7 days -diflucan 150mg  daily x 2 days  3. Malignant neoplasm of urinary bladder neck (HCC) -We discussed the natural hx of TaG3 bladder cancer and the various treatment options including surveillance, BCG therapy, epirubin, etc. The patient and daughter wish to proceed with surveillance.     No follow-ups on file.  Nicolette Bang, MD  [facility] [place]

## 2019-04-07 ENCOUNTER — Ambulatory Visit: Payer: Medicare Other | Attending: Internal Medicine

## 2019-04-07 ENCOUNTER — Other Ambulatory Visit: Payer: Self-pay

## 2019-04-07 DIAGNOSIS — Z23 Encounter for immunization: Secondary | ICD-10-CM

## 2019-04-07 LAB — URINE CULTURE: Culture: 10000 — AB

## 2019-04-07 NOTE — Progress Notes (Signed)
   Covid-19 Vaccination Clinic  Name:  Marie Hunt    MRN: [id number] DOB: Dec 08, 1932  04/07/2019  [name] was observed post Covid-19 immunization for 15 minutes without incidence. She was provided with Vaccine Information Sheet and instruction to access the V-Safe system.   [name] was instructed to call 911 with any severe reactions post vaccine: Marland Kitchen Difficulty breathing  . Swelling of your face and throat  . A fast heartbeat  . A bad rash all over your body  . Dizziness and weakness    Immunizations Administered    Name Date Dose VIS Date Route   Moderna COVID-19 Vaccine 04/07/2019 10:52 AM 0.5 mL 01/25/2019 Intramuscular   Manufacturer: Moderna   Lot: [id number]   [place]: [id number]

## 2019-04-08 LAB — URINE CULTURE: Culture: >=100000 — AB

## 2019-04-09 ENCOUNTER — Encounter (HOSPITAL_COMMUNITY): Payer: Self-pay

## 2019-04-13 ENCOUNTER — Ambulatory Visit (INDEPENDENT_AMBULATORY_CARE_PROVIDER_SITE_OTHER): Payer: Medicare Other | Admitting: Urology

## 2019-04-13 ENCOUNTER — Telehealth: Payer: Self-pay

## 2019-04-13 ENCOUNTER — Encounter: Payer: Self-pay | Admitting: Urology

## 2019-04-13 ENCOUNTER — Ambulatory Visit: Payer: Medicare Other | Admitting: Urology

## 2019-04-13 ENCOUNTER — Other Ambulatory Visit: Payer: Self-pay

## 2019-04-13 VITALS — BP 118/74 | HR 88 | Temp 97.5°F | Ht 62.0 in | Wt 145.5 lb

## 2019-04-13 DIAGNOSIS — C675 Malignant neoplasm of bladder neck: Secondary | ICD-10-CM | POA: Diagnosis not present

## 2019-04-13 DIAGNOSIS — H34811 Central retinal vein occlusion, right eye, with macular edema: Secondary | ICD-10-CM | POA: Diagnosis not present

## 2019-04-13 LAB — POCT URINALYSIS DIPSTICK
Bilirubin, UA: NEGATIVE
Glucose, UA: NEGATIVE
Ketones, UA: NEGATIVE
Nitrite, UA: NEGATIVE
Protein, UA: POSITIVE — AB
Spec Grav, UA: 1.015 (ref 1.010–1.025)
Urobilinogen, UA: 0.2 E.U./dL
pH, UA: 8 (ref 5.0–8.0)

## 2019-04-13 MED ORDER — CIPROFLOXACIN HCL 500 MG PO TABS
500.0000 mg | ORAL_TABLET | Freq: Once | ORAL | Status: AC
  Administered 2019-04-13: 12:00:00 500 mg via ORAL

## 2019-04-13 NOTE — Progress Notes (Signed)
   04/13/19  CC: bladder cancer  HPI: [name] is a 86yo here for stent removal after TURBT  Blood pressure 118/74, pulse 88, temperature (!) 97.5 F (36.4 C), height 5\' 2"  (1.575 m), weight 145 lb 8 oz (66 kg). NED. A&Ox3.   No respiratory distress   Abd soft, NT, ND Normal external genitalia with patent urethral meatus  Cystoscopy Procedure Note  Patient identification was confirmed, informed consent was obtained, and patient was prepped using Betadine solution.  Lidocaine jelly was administered per urethral meatus.    Procedure: - Flexible cystoscope introduced, without any difficulty.   - Thorough search of the bladder revealed:    normal urethral meatus    normal urothelium    no stones    no ulcers     no tumors    no urethral polyps    no trabeculation  - Ureteral orifices were normal in position and appearance. -Left ureteral stent removed intact  Post-Procedure: - Patient tolerated the procedure well  Assessment/ Plan:  RTC 3 months for cystoscopy  No follow-ups on file.  Nicolette Bang, MD

## 2019-04-13 NOTE — Patient Instructions (Signed)
Bladder Cancer  Bladder cancer is an abnormal growth of tissue in the bladder. The bladder is the balloon-like sac in the pelvis. It collects and stores urine that comes from the kidneys through the ureters. The bladder wall is made of layers. If cancer spreads into these layers and through the wall of the bladder, it becomes more difficult to treat. What are the causes? The cause of this condition is not known. What increases the risk? The following factors may make you more likely to develop this condition:  Smoking.  Workplace risks (occupational exposures), such as rubber, leather, textile, dyes, chemicals, and paint.  Being white.  Your age. Most people with bladder cancer are over the age of 55.  Being female.  Having chronic bladder inflammation.  Having a personal history of bladder cancer.  Having a family history of bladder cancer (heredity).  Having had chemotherapy or radiation therapy to the pelvis.  Having been exposed to arsenic. What are the signs or symptoms? Initial symptoms of this condition include:  Blood in the urine.  Painful urination.  Frequent bladder or urine infections.  Increase in urgency and frequency of urination. Advanced symptoms of this condition include:  Not being able to urinate.  Low back pain on one side.  Loss of appetite.  Weight loss.  Fatigue.  Swelling in the feet.  Bone pain. How is this diagnosed? This condition is diagnosed based on your medical history, a physical exam, urine tests, lab tests, imaging tests, and your symptoms. You may also have other tests or procedures done, such as:  A narrow tube being inserted into your bladder through your urethra (cystoscopy) in order to view the lining of your bladder for tumors.  A biopsy to sample the tumor to see if cancer is present. If cancer is present, it will then be staged to determine its severity and extent. Staging is an assessment of:  The size of the  tumor.  Whether the cancer has spread.  Where the cancer has spread. It is important to know how deeply into the bladder wall cancer has grown and whether cancer has spread to any other parts of your body. Staging may require blood tests or imaging tests, such as a CT scan, MRI, bone scan, or chest X-ray. How is this treated? Based on the stage of cancer, one treatment or a combination of treatments may be recommended. The most common forms of treatment are:  Surgery to remove the cancer. Procedures that may be done include transurethral resection and cystectomy.  Radiation therapy. This is high-energy X-rays or other particles. This is often used in combination with chemotherapy.  Chemotherapy. During this treatment, medicines are used to kill cancer cells.  Immunotherapy. This uses medicines to help your own immune system destroy cancer cells. Follow these instructions at home:  Take over-the-counter and prescription medicines only as told by your health care provider.  Maintain a healthy diet. Some of your treatments might affect your appetite.  Consider joining a support group. This may help you learn to cope with the stress of having bladder cancer.  Tell your cancer care team if you develop side effects. They may be able to recommend ways to relieve them.  Keep all follow-up visits as told by your health care provider. This is important. Where to find more information  American Cancer Society: www.cancer.org  National Cancer Institute (NCI): www.cancer.gov Contact a health care provider if:  You have symptoms of a urinary tract infection. These include: ?   Fever. ? Chills. ? Weakness. ? Muscle aches. ? Abdominal pain. ? Frequent and intense urge to urinate. ? Burning feeling in the bladder or urethra during urination. Get help right away if:  There is blood in your urine.  You cannot urinate.  You have severe pain or other symptoms that do not go  away. Summary  Bladder cancer is an abnormal growth of tissue in the bladder.  This condition is diagnosed based on your medical history, a physical exam, urine tests, lab tests, imaging tests, and your symptoms.  Based on the stage of cancer, surgery, chemotherapy, or a combination of treatments may be recommended.  Consider joining a support group. This may help you learn to cope with the stress of having bladder cancer. This information is not intended to replace advice given to you by your health care provider. Make sure you discuss any questions you have with your health care provider. Document Revised: 01/23/2017 Document Reviewed: 01/15/2016 Elsevier Patient Education  2020 Elsevier Inc.  

## 2019-04-13 NOTE — Telephone Encounter (Signed)
Pt had office visit today. Informed by MD

## 2019-04-13 NOTE — Addendum Note (Signed)
Addended byIris Pert on: 04/13/2019 12:03 PM   Modules accepted: Orders

## 2019-04-13 NOTE — Progress Notes (Signed)
Urological Symptom Review ° °Patient is experiencing the following symptoms: °Burning/pain with urination °Blood in urine? ° ° °Review of Systems ° °Gastrointestinal (upper)  : °Negative for upper GI symptoms ° °Gastrointestinal (lower) : °Negative for lower GI symptoms ° °Constitutional : °Negative for symptoms ° °Skin: °Negative for skin symptoms ° °Eyes: °Negative for eye symptoms ° °Ear/Nose/Throat : °Negative for Ear/Nose/Throat symptoms ° °Hematologic/Lymphatic: °Negative for Hematologic/Lymphatic symptoms ° °Cardiovascular : °Negative for cardiovascular symptoms ° °Respiratory : °Negative for respiratory symptoms ° °Endocrine: °Negative for endocrine symptoms ° °Musculoskeletal: °Negative for musculoskeletal symptoms ° °Neurological: °Negative for neurological symptoms ° °Psychologic: °Negative for psychiatric symptoms °

## 2019-04-13 NOTE — Telephone Encounter (Signed)
-----   Message from Dorisann Frames, RN sent at 04/13/2019  9:35 AM EST ----- Left message to return call ----- Message ----- From: Cleon Gustin, MD Sent: 04/13/2019   9:27 AM EST To: Dorisann Frames, RN  Gave ceftin. We are good ----- Message ----- From: Dorisann Frames, RN Sent: 04/08/2019   2:07 PM EST To: Cleon Gustin, MD  Positive culture

## 2019-05-09 ENCOUNTER — Ambulatory Visit: Payer: Medicare Other | Attending: Internal Medicine

## 2019-05-09 DIAGNOSIS — Z23 Encounter for immunization: Secondary | ICD-10-CM

## 2019-05-09 NOTE — Progress Notes (Signed)
   Covid-19 Vaccination Clinic  Name:  Marie Hunt    MRN: [id number] DOB: 03/26/1932  05/09/2019  Marie Hunt was observed post Covid-19 immunization for 15 minutes without incident. She was provided with Vaccine Information Sheet and instruction to access the V-Safe system.   Marie Hunt was instructed to call 911 with any severe reactions post vaccine: Marland Kitchen Difficulty breathing  . Swelling of face and throat  . A fast heartbeat  . A bad rash all over body  . Dizziness and weakness   Immunizations Administered    Name Date Dose VIS Date Route   Moderna COVID-19 Vaccine 05/09/2019 10:17 AM 0.5 mL 01/25/2019 Intramuscular   Manufacturer: Moderna   Lot: [id number]   [place]: [id number]

## 2019-05-19 DIAGNOSIS — E119 Type 2 diabetes mellitus without complications: Secondary | ICD-10-CM | POA: Diagnosis not present

## 2019-05-19 DIAGNOSIS — M159 Polyosteoarthritis, unspecified: Secondary | ICD-10-CM | POA: Diagnosis not present

## 2019-05-19 DIAGNOSIS — I10 Essential (primary) hypertension: Secondary | ICD-10-CM | POA: Diagnosis not present

## 2019-05-25 DIAGNOSIS — H43812 Vitreous degeneration, left eye: Secondary | ICD-10-CM | POA: Diagnosis not present

## 2019-05-25 DIAGNOSIS — H34811 Central retinal vein occlusion, right eye, with macular edema: Secondary | ICD-10-CM | POA: Diagnosis not present

## 2019-06-21 DIAGNOSIS — C679 Malignant neoplasm of bladder, unspecified: Secondary | ICD-10-CM | POA: Diagnosis not present

## 2019-06-21 DIAGNOSIS — E1129 Type 2 diabetes mellitus with other diabetic kidney complication: Secondary | ICD-10-CM | POA: Diagnosis not present

## 2019-06-21 DIAGNOSIS — E1165 Type 2 diabetes mellitus with hyperglycemia: Secondary | ICD-10-CM | POA: Diagnosis not present

## 2019-06-21 DIAGNOSIS — Z299 Encounter for prophylactic measures, unspecified: Secondary | ICD-10-CM | POA: Diagnosis not present

## 2019-06-21 DIAGNOSIS — I10 Essential (primary) hypertension: Secondary | ICD-10-CM | POA: Diagnosis not present

## 2019-06-22 DIAGNOSIS — R5383 Other fatigue: Secondary | ICD-10-CM | POA: Diagnosis not present

## 2019-06-22 DIAGNOSIS — E559 Vitamin D deficiency, unspecified: Secondary | ICD-10-CM | POA: Diagnosis not present

## 2019-06-22 DIAGNOSIS — Z79899 Other long term (current) drug therapy: Secondary | ICD-10-CM | POA: Diagnosis not present

## 2019-06-22 DIAGNOSIS — E7800 Pure hypercholesterolemia, unspecified: Secondary | ICD-10-CM | POA: Diagnosis not present

## 2019-06-22 DIAGNOSIS — N184 Chronic kidney disease, stage 4 (severe): Secondary | ICD-10-CM | POA: Diagnosis not present

## 2019-07-04 DIAGNOSIS — H34811 Central retinal vein occlusion, right eye, with macular edema: Secondary | ICD-10-CM | POA: Diagnosis not present

## 2019-07-04 DIAGNOSIS — H35033 Hypertensive retinopathy, bilateral: Secondary | ICD-10-CM | POA: Diagnosis not present

## 2019-07-04 DIAGNOSIS — H43812 Vitreous degeneration, left eye: Secondary | ICD-10-CM | POA: Diagnosis not present

## 2019-07-04 DIAGNOSIS — H35372 Puckering of macula, left eye: Secondary | ICD-10-CM | POA: Diagnosis not present

## 2019-07-13 ENCOUNTER — Ambulatory Visit (INDEPENDENT_AMBULATORY_CARE_PROVIDER_SITE_OTHER): Payer: Medicare Other | Admitting: Urology

## 2019-07-13 ENCOUNTER — Encounter: Payer: Self-pay | Admitting: Urology

## 2019-07-13 ENCOUNTER — Other Ambulatory Visit: Payer: Self-pay

## 2019-07-13 VITALS — BP 132/65 | HR 60 | Temp 97.3°F | Ht 62.0 in | Wt 150.0 lb

## 2019-07-13 DIAGNOSIS — C675 Malignant neoplasm of bladder neck: Secondary | ICD-10-CM

## 2019-07-13 LAB — POCT URINALYSIS DIPSTICK
Bilirubin, UA: NEGATIVE
Glucose, UA: NEGATIVE
Ketones, UA: NEGATIVE
Nitrite, UA: NEGATIVE
Protein, UA: POSITIVE — AB
Spec Grav, UA: <=1.005 — AB (ref 1.010–1.025)
Urobilinogen, UA: NEGATIVE E.U./dL — AB
pH, UA: 6 (ref 5.0–8.0)

## 2019-07-13 MED ORDER — CIPROFLOXACIN HCL 500 MG PO TABS
500.0000 mg | ORAL_TABLET | Freq: Once | ORAL | Status: AC
  Administered 2019-07-13: 500 mg via ORAL

## 2019-07-13 NOTE — Patient Instructions (Signed)
Bladder Cancer  Bladder cancer is an abnormal growth of tissue in the bladder. The bladder is the balloon-like sac in the pelvis. It collects and stores urine that comes from the kidneys through the ureters. The bladder wall is made of layers. If cancer spreads into these layers and through the wall of the bladder, it becomes more difficult to treat. What are the causes? The cause of this condition is not known. What increases the risk? The following factors may make you more likely to develop this condition:  Smoking.  Workplace risks (occupational exposures), such as rubber, leather, textile, dyes, chemicals, and paint.  Being white.  Your age. Most people with bladder cancer are over the age of 55.  Being female.  Having chronic bladder inflammation.  Having a personal history of bladder cancer.  Having a family history of bladder cancer (heredity).  Having had chemotherapy or radiation therapy to the pelvis.  Having been exposed to arsenic. What are the signs or symptoms? Initial symptoms of this condition include:  Blood in the urine.  Painful urination.  Frequent bladder or urine infections.  Increase in urgency and frequency of urination. Advanced symptoms of this condition include:  Not being able to urinate.  Low back pain on one side.  Loss of appetite.  Weight loss.  Fatigue.  Swelling in the feet.  Bone pain. How is this diagnosed? This condition is diagnosed based on your medical history, a physical exam, urine tests, lab tests, imaging tests, and your symptoms. You may also have other tests or procedures done, such as:  A narrow tube being inserted into your bladder through your urethra (cystoscopy) in order to view the lining of your bladder for tumors.  A biopsy to sample the tumor to see if cancer is present. If cancer is present, it will then be staged to determine its severity and extent. Staging is an assessment of:  The size of the  tumor.  Whether the cancer has spread.  Where the cancer has spread. It is important to know how deeply into the bladder wall cancer has grown and whether cancer has spread to any other parts of your body. Staging may require blood tests or imaging tests, such as a CT scan, MRI, bone scan, or chest X-ray. How is this treated? Based on the stage of cancer, one treatment or a combination of treatments may be recommended. The most common forms of treatment are:  Surgery to remove the cancer. Procedures that may be done include transurethral resection and cystectomy.  Radiation therapy. This is high-energy X-rays or other particles. This is often used in combination with chemotherapy.  Chemotherapy. During this treatment, medicines are used to kill cancer cells.  Immunotherapy. This uses medicines to help your own immune system destroy cancer cells. Follow these instructions at home:  Take over-the-counter and prescription medicines only as told by your health care provider.  Maintain a healthy diet. Some of your treatments might affect your appetite.  Consider joining a support group. This may help you learn to cope with the stress of having bladder cancer.  Tell your cancer care team if you develop side effects. They may be able to recommend ways to relieve them.  Keep all follow-up visits as told by your health care provider. This is important. Where to find more information  American Cancer Society: www.cancer.org  National Cancer Institute (NCI): www.cancer.gov Contact a health care provider if:  You have symptoms of a urinary tract infection. These include: ?   Fever. ? Chills. ? Weakness. ? Muscle aches. ? Abdominal pain. ? Frequent and intense urge to urinate. ? Burning feeling in the bladder or urethra during urination. Get help right away if:  There is blood in your urine.  You cannot urinate.  You have severe pain or other symptoms that do not go  away. Summary  Bladder cancer is an abnormal growth of tissue in the bladder.  This condition is diagnosed based on your medical history, a physical exam, urine tests, lab tests, imaging tests, and your symptoms.  Based on the stage of cancer, surgery, chemotherapy, or a combination of treatments may be recommended.  Consider joining a support group. This may help you learn to cope with the stress of having bladder cancer. This information is not intended to replace advice given to you by your health care provider. Make sure you discuss any questions you have with your health care provider. Document Revised: 01/23/2017 Document Reviewed: 01/15/2016 Elsevier Patient Education  2020 Elsevier Inc.  

## 2019-07-13 NOTE — Progress Notes (Signed)
   07/13/19  CC: bladder cancer  HPI: [name] is a 86yo here for followup for bladder cancer. No hematuria or dysuria.  Blood pressure 132/65, pulse 60, temperature (!) 97.3 F (36.3 C), height 5\' 2"  (1.575 m), weight 150 lb (68 kg). NED. A&Ox3.   No respiratory distress   Abd soft, NT, ND Normal external genitalia with patent urethral meatus  Cystoscopy Procedure Note  Patient identification was confirmed, informed consent was obtained, and patient was prepped using Betadine solution.  Lidocaine jelly was administered per urethral meatus.    Procedure: - Flexible cystoscope introduced, without any difficulty.   - Thorough search of the bladder revealed:    normal urethral meatus    normal urothelium    no stones    no ulcers     no tumors    no urethral polyps    no trabeculation  - Ureteral orifices were normal in position and appearance.  Post-Procedure: - Patient tolerated the procedure well  Assessment/ Plan: RTC 3 months for cystoscopy   No follow-ups on file.  Nicolette Bang, MD

## 2019-07-13 NOTE — Progress Notes (Signed)
Urological Symptom Review  Patient is experiencing the following symptoms: Burning/pain with urination   Review of Systems  Gastrointestinal (upper)  : Negative for upper GI symptoms  Gastrointestinal (lower) : Negative for lower GI symptoms  Constitutional : Negative for symptoms  Skin: Negative for skin symptoms  Eyes: Negative for eye symptoms  Ear/Nose/Throat : Negative for Ear/Nose/Throat symptoms  Hematologic/Lymphatic: Negative for Hematologic/Lymphatic symptoms  Cardiovascular : Leg swelling  Respiratory : Negative for respiratory symptoms  Endocrine: Negative for endocrine symptoms  Musculoskeletal: Negative for musculoskeletal symptoms  Neurological: Negative for neurological symptoms  Psychologic: Negative for psychiatric symptoms

## 2019-07-22 DIAGNOSIS — I10 Essential (primary) hypertension: Secondary | ICD-10-CM | POA: Diagnosis not present

## 2019-07-22 DIAGNOSIS — N184 Chronic kidney disease, stage 4 (severe): Secondary | ICD-10-CM | POA: Diagnosis not present

## 2019-07-22 DIAGNOSIS — R350 Frequency of micturition: Secondary | ICD-10-CM | POA: Diagnosis not present

## 2019-07-22 DIAGNOSIS — E1165 Type 2 diabetes mellitus with hyperglycemia: Secondary | ICD-10-CM | POA: Diagnosis not present

## 2019-07-22 DIAGNOSIS — N390 Urinary tract infection, site not specified: Secondary | ICD-10-CM | POA: Diagnosis not present

## 2019-07-22 DIAGNOSIS — E1129 Type 2 diabetes mellitus with other diabetic kidney complication: Secondary | ICD-10-CM | POA: Diagnosis not present

## 2019-07-22 DIAGNOSIS — Z299 Encounter for prophylactic measures, unspecified: Secondary | ICD-10-CM | POA: Diagnosis not present

## 2019-07-24 DIAGNOSIS — I10 Essential (primary) hypertension: Secondary | ICD-10-CM | POA: Diagnosis not present

## 2019-07-24 DIAGNOSIS — E119 Type 2 diabetes mellitus without complications: Secondary | ICD-10-CM | POA: Diagnosis not present

## 2019-07-24 DIAGNOSIS — M159 Polyosteoarthritis, unspecified: Secondary | ICD-10-CM | POA: Diagnosis not present

## 2019-08-22 DIAGNOSIS — H34811 Central retinal vein occlusion, right eye, with macular edema: Secondary | ICD-10-CM | POA: Diagnosis not present

## 2019-08-24 DIAGNOSIS — M159 Polyosteoarthritis, unspecified: Secondary | ICD-10-CM | POA: Diagnosis not present

## 2019-08-24 DIAGNOSIS — E119 Type 2 diabetes mellitus without complications: Secondary | ICD-10-CM | POA: Diagnosis not present

## 2019-08-24 DIAGNOSIS — I10 Essential (primary) hypertension: Secondary | ICD-10-CM | POA: Diagnosis not present

## 2019-09-23 DIAGNOSIS — E119 Type 2 diabetes mellitus without complications: Secondary | ICD-10-CM | POA: Diagnosis not present

## 2019-09-23 DIAGNOSIS — M159 Polyosteoarthritis, unspecified: Secondary | ICD-10-CM | POA: Diagnosis not present

## 2019-09-23 DIAGNOSIS — I10 Essential (primary) hypertension: Secondary | ICD-10-CM | POA: Diagnosis not present

## 2019-09-29 DIAGNOSIS — I10 Essential (primary) hypertension: Secondary | ICD-10-CM | POA: Diagnosis not present

## 2019-09-29 DIAGNOSIS — E1165 Type 2 diabetes mellitus with hyperglycemia: Secondary | ICD-10-CM | POA: Diagnosis not present

## 2019-09-29 DIAGNOSIS — N184 Chronic kidney disease, stage 4 (severe): Secondary | ICD-10-CM | POA: Diagnosis not present

## 2019-09-29 DIAGNOSIS — Z299 Encounter for prophylactic measures, unspecified: Secondary | ICD-10-CM | POA: Diagnosis not present

## 2019-09-29 DIAGNOSIS — J069 Acute upper respiratory infection, unspecified: Secondary | ICD-10-CM | POA: Diagnosis not present

## 2019-09-29 DIAGNOSIS — E1122 Type 2 diabetes mellitus with diabetic chronic kidney disease: Secondary | ICD-10-CM | POA: Diagnosis not present

## 2019-10-03 DIAGNOSIS — H43812 Vitreous degeneration, left eye: Secondary | ICD-10-CM | POA: Diagnosis not present

## 2019-10-03 DIAGNOSIS — H34811 Central retinal vein occlusion, right eye, with macular edema: Secondary | ICD-10-CM | POA: Diagnosis not present

## 2019-10-03 DIAGNOSIS — H35033 Hypertensive retinopathy, bilateral: Secondary | ICD-10-CM | POA: Diagnosis not present

## 2019-10-03 DIAGNOSIS — H35372 Puckering of macula, left eye: Secondary | ICD-10-CM | POA: Diagnosis not present

## 2019-10-07 DIAGNOSIS — E119 Type 2 diabetes mellitus without complications: Secondary | ICD-10-CM | POA: Diagnosis not present

## 2019-10-07 DIAGNOSIS — I10 Essential (primary) hypertension: Secondary | ICD-10-CM | POA: Diagnosis not present

## 2019-10-07 DIAGNOSIS — M159 Polyosteoarthritis, unspecified: Secondary | ICD-10-CM | POA: Diagnosis not present

## 2019-10-19 ENCOUNTER — Ambulatory Visit (INDEPENDENT_AMBULATORY_CARE_PROVIDER_SITE_OTHER): Payer: Medicare Other | Admitting: Urology

## 2019-10-19 ENCOUNTER — Other Ambulatory Visit: Payer: Medicare Other | Admitting: Urology

## 2019-10-19 ENCOUNTER — Other Ambulatory Visit: Payer: Self-pay

## 2019-10-19 ENCOUNTER — Encounter: Payer: Self-pay | Admitting: Urology

## 2019-10-19 VITALS — BP 176/56 | HR 71 | Temp 98.1°F | Ht 62.0 in | Wt 150.0 lb

## 2019-10-19 DIAGNOSIS — C675 Malignant neoplasm of bladder neck: Secondary | ICD-10-CM | POA: Diagnosis not present

## 2019-10-19 LAB — URINALYSIS, ROUTINE W REFLEX MICROSCOPIC
Bilirubin, UA: NEGATIVE
Glucose, UA: NEGATIVE
Ketones, UA: NEGATIVE
Nitrite, UA: NEGATIVE
Specific Gravity, UA: 1.015 (ref 1.005–1.030)
Urobilinogen, Ur: 0.2 mg/dL (ref 0.2–1.0)
pH, UA: 5 (ref 5.0–7.5)

## 2019-10-19 LAB — MICROSCOPIC EXAMINATION
Epithelial Cells (non renal): >10 /hpf — AB (ref 0–10)
WBC, UA: >30 /hpf — AB (ref 0–5)

## 2019-10-19 MED ORDER — CIPROFLOXACIN HCL 500 MG PO TABS
500.0000 mg | ORAL_TABLET | Freq: Once | ORAL | Status: AC
  Administered 2019-10-19: 500 mg via ORAL

## 2019-10-19 NOTE — Patient Instructions (Signed)
Bladder Cancer  Bladder cancer is an abnormal growth of tissue in the bladder. The bladder is the balloon-like sac in the pelvis. It collects and stores urine that comes from the kidneys through the ureters. The bladder wall is made of layers. If cancer spreads into these layers and through the wall of the bladder, it becomes more difficult to treat. What are the causes? The cause of this condition is not known. What increases the risk? The following factors may make you more likely to develop this condition:  Smoking.  Workplace risks (occupational exposures), such as rubber, leather, textile, dyes, chemicals, and paint.  Being white.  Your age. Most people with bladder cancer are over the age of 55.  Being female.  Having chronic bladder inflammation.  Having a personal history of bladder cancer.  Having a family history of bladder cancer (heredity).  Having had chemotherapy or radiation therapy to the pelvis.  Having been exposed to arsenic. What are the signs or symptoms? Initial symptoms of this condition include:  Blood in the urine.  Painful urination.  Frequent bladder or urine infections.  Increase in urgency and frequency of urination. Advanced symptoms of this condition include:  Not being able to urinate.  Low back pain on one side.  Loss of appetite.  Weight loss.  Fatigue.  Swelling in the feet.  Bone pain. How is this diagnosed? This condition is diagnosed based on your medical history, a physical exam, urine tests, lab tests, imaging tests, and your symptoms. You may also have other tests or procedures done, such as:  A narrow tube being inserted into your bladder through your urethra (cystoscopy) in order to view the lining of your bladder for tumors.  A biopsy to sample the tumor to see if cancer is present. If cancer is present, it will then be staged to determine its severity and extent. Staging is an assessment of:  The size of the  tumor.  Whether the cancer has spread.  Where the cancer has spread. It is important to know how deeply into the bladder wall cancer has grown and whether cancer has spread to any other parts of your body. Staging may require blood tests or imaging tests, such as a CT scan, MRI, bone scan, or chest X-ray. How is this treated? Based on the stage of cancer, one treatment or a combination of treatments may be recommended. The most common forms of treatment are:  Surgery to remove the cancer. Procedures that may be done include transurethral resection and cystectomy.  Radiation therapy. This is high-energy X-rays or other particles. This is often used in combination with chemotherapy.  Chemotherapy. During this treatment, medicines are used to kill cancer cells.  Immunotherapy. This uses medicines to help your own immune system destroy cancer cells. Follow these instructions at home:  Take over-the-counter and prescription medicines only as told by your health care provider.  Maintain a healthy diet. Some of your treatments might affect your appetite.  Consider joining a support group. This may help you learn to cope with the stress of having bladder cancer.  Tell your cancer care team if you develop side effects. They may be able to recommend ways to relieve them.  Keep all follow-up visits as told by your health care provider. This is important. Where to find more information  American Cancer Society: www.cancer.org  National Cancer Institute (NCI): www.cancer.gov Contact a health care provider if:  You have symptoms of a urinary tract infection. These include: ?   Fever. ? Chills. ? Weakness. ? Muscle aches. ? Abdominal pain. ? Frequent and intense urge to urinate. ? Burning feeling in the bladder or urethra during urination. Get help right away if:  There is blood in your urine.  You cannot urinate.  You have severe pain or other symptoms that do not go  away. Summary  Bladder cancer is an abnormal growth of tissue in the bladder.  This condition is diagnosed based on your medical history, a physical exam, urine tests, lab tests, imaging tests, and your symptoms.  Based on the stage of cancer, surgery, chemotherapy, or a combination of treatments may be recommended.  Consider joining a support group. This may help you learn to cope with the stress of having bladder cancer. This information is not intended to replace advice given to you by your health care provider. Make sure you discuss any questions you have with your health care provider. Document Revised: 01/23/2017 Document Reviewed: 01/15/2016 Elsevier Patient Education  2020 Elsevier Inc.  

## 2019-10-19 NOTE — Progress Notes (Signed)
Urological Symptom Review  Patient is experiencing the following symptoms: Get up at night to urinate Urinary tract infection   Review of Systems  Gastrointestinal (upper)  : Negative for upper GI symptoms  Gastrointestinal (lower) : Negative for lower GI symptoms  Constitutional : Night Sweats  Skin: Negative for skin symptoms  Eyes: Negative for eye symptoms  Ear/Nose/Throat : Negative for Ear/Nose/Throat symptoms  Hematologic/Lymphatic: Negative for Hematologic/Lymphatic symptoms  Cardiovascular : Negative for cardiovascular symptoms  Respiratory : Cough  Endocrine: Negative for endocrine symptoms  Musculoskeletal: Negative for musculoskeletal symptoms  Neurological: Negative for neurological symptoms  Psychologic: Negative for psychiatric symptoms

## 2019-10-19 NOTE — Progress Notes (Signed)
   10/19/19  CC: Bladder cancer  HPI: Ms Carthen is a 86yo here for followup for TaG3 bladder cancer. No hematuria or dysuria. Bladder cancer was diagnosed in 03/2019  Blood pressure (!) 176/56, pulse 71, temperature 98.1 F (36.7 C), height 5\' 2"  (1.575 m), weight 150 lb (68 kg). NED. A&Ox3.   No respiratory distress   Abd soft, NT, ND Normal external genitalia with patent urethral meatus  Cystoscopy Procedure Note  Patient identification was confirmed, informed consent was obtained, and patient was prepped using Betadine solution.  Lidocaine jelly was administered per urethral meatus.    Procedure: - Flexible cystoscope introduced, without any difficulty.   - Thorough search of the bladder revealed:    normal urethral meatus    normal urothelium    no stones    no ulcers     no tumors    no urethral polyps    no trabeculation  - Ureteral orifices were normal in position and appearance.  Post-Procedure: - Patient tolerated the procedure well  Assessment/ Plan:    Return in about 3 months (around 01/19/2020) for cystoscopy.  Nicolette Bang, MD

## 2019-11-09 DIAGNOSIS — H35372 Puckering of macula, left eye: Secondary | ICD-10-CM | POA: Diagnosis not present

## 2019-11-09 DIAGNOSIS — H3582 Retinal ischemia: Secondary | ICD-10-CM | POA: Diagnosis not present

## 2019-11-09 DIAGNOSIS — H34811 Central retinal vein occlusion, right eye, with macular edema: Secondary | ICD-10-CM | POA: Diagnosis not present

## 2019-11-24 DIAGNOSIS — I10 Essential (primary) hypertension: Secondary | ICD-10-CM | POA: Diagnosis not present

## 2019-11-24 DIAGNOSIS — M159 Polyosteoarthritis, unspecified: Secondary | ICD-10-CM | POA: Diagnosis not present

## 2019-11-24 DIAGNOSIS — E119 Type 2 diabetes mellitus without complications: Secondary | ICD-10-CM | POA: Diagnosis not present

## 2019-12-23 DIAGNOSIS — M159 Polyosteoarthritis, unspecified: Secondary | ICD-10-CM | POA: Diagnosis not present

## 2019-12-23 DIAGNOSIS — E119 Type 2 diabetes mellitus without complications: Secondary | ICD-10-CM | POA: Diagnosis not present

## 2019-12-23 DIAGNOSIS — I10 Essential (primary) hypertension: Secondary | ICD-10-CM | POA: Diagnosis not present

## 2019-12-28 DIAGNOSIS — H34811 Central retinal vein occlusion, right eye, with macular edema: Secondary | ICD-10-CM | POA: Diagnosis not present

## 2020-01-18 ENCOUNTER — Other Ambulatory Visit: Payer: Self-pay

## 2020-01-18 ENCOUNTER — Ambulatory Visit (INDEPENDENT_AMBULATORY_CARE_PROVIDER_SITE_OTHER): Payer: Medicare Other | Admitting: Urology

## 2020-01-18 ENCOUNTER — Encounter: Payer: Self-pay | Admitting: Urology

## 2020-01-18 VITALS — BP 160/62 | HR 75 | Temp 98.8°F | Ht 64.0 in | Wt 151.0 lb

## 2020-01-18 DIAGNOSIS — C675 Malignant neoplasm of bladder neck: Secondary | ICD-10-CM

## 2020-01-18 MED ORDER — CIPROFLOXACIN HCL 500 MG PO TABS
500.0000 mg | ORAL_TABLET | Freq: Once | ORAL | Status: AC
  Administered 2020-01-18: 500 mg via ORAL

## 2020-01-18 NOTE — Progress Notes (Signed)
   01/18/20  CC: Followup bladder cancer  HPI: [name] is a 86yo here for followup for TaG3 bladder cancer diagnosed in 03/2018. No dysuira or hematuria Blood pressure (!) 160/62, pulse 75, temperature 98.8 F (37.1 C), height 5\' 4"  (1.626 m), weight 151 lb (68.5 kg). NED. A&Ox3.   No respiratory distress   Abd soft, NT, ND Normal external genitalia with patent urethral meatus  Cystoscopy Procedure Note  Patient identification was confirmed, informed consent was obtained, and patient was prepped using Betadine solution.  Lidocaine jelly was administered per urethral meatus.    Procedure: - Flexible cystoscope introduced, without any difficulty.   - Thorough search of the bladder revealed:    normal urethral meatus    normal urothelium    no stones    no ulcers     no tumors    no urethral polyps    no trabeculation  - Ureteral orifices were normal in position and appearance.  Post-Procedure: - Patient tolerated the procedure well  Assessment/ Plan: RTC 3 months for cystoscopy   No follow-ups on file.  Nicolette Bang, MD

## 2020-01-18 NOTE — Patient Instructions (Signed)
Bladder Cancer  Bladder cancer is an abnormal growth of tissue in the bladder. The bladder is the balloon-like sac in the pelvis. It collects and stores urine that comes from the kidneys through the ureters. The bladder wall is made of layers. If cancer spreads into these layers and through the wall of the bladder, it becomes more difficult to treat. What are the causes? The cause of this condition is not known. What increases the risk? The following factors may make you more likely to develop this condition:  Smoking.  Workplace risks (occupational exposures), such as rubber, leather, textile, dyes, chemicals, and paint.  Being white.  Your age. Most people with bladder cancer are over the age of 55.  Being female.  Having chronic bladder inflammation.  Having a personal history of bladder cancer.  Having a family history of bladder cancer (heredity).  Having had chemotherapy or radiation therapy to the pelvis.  Having been exposed to arsenic. What are the signs or symptoms? Initial symptoms of this condition include:  Blood in the urine.  Painful urination.  Frequent bladder or urine infections.  Increase in urgency and frequency of urination. Advanced symptoms of this condition include:  Not being able to urinate.  Low back pain on one side.  Loss of appetite.  Weight loss.  Fatigue.  Swelling in the feet.  Bone pain. How is this diagnosed? This condition is diagnosed based on your medical history, a physical exam, urine tests, lab tests, imaging tests, and your symptoms. You may also have other tests or procedures done, such as:  A narrow tube being inserted into your bladder through your urethra (cystoscopy) in order to view the lining of your bladder for tumors.  A biopsy to sample the tumor to see if cancer is present. If cancer is present, it will then be staged to determine its severity and extent. Staging is an assessment of:  The size of the  tumor.  Whether the cancer has spread.  Where the cancer has spread. It is important to know how deeply into the bladder wall cancer has grown and whether cancer has spread to any other parts of your body. Staging may require blood tests or imaging tests, such as a CT scan, MRI, bone scan, or chest X-ray. How is this treated? Based on the stage of cancer, one treatment or a combination of treatments may be recommended. The most common forms of treatment are:  Surgery to remove the cancer. Procedures that may be done include transurethral resection and cystectomy.  Radiation therapy. This is high-energy X-rays or other particles. This is often used in combination with chemotherapy.  Chemotherapy. During this treatment, medicines are used to kill cancer cells.  Immunotherapy. This uses medicines to help your own immune system destroy cancer cells. Follow these instructions at home:  Take over-the-counter and prescription medicines only as told by your health care provider.  Maintain a healthy diet. Some of your treatments might affect your appetite.  Consider joining a support group. This may help you learn to cope with the stress of having bladder cancer.  Tell your cancer care team if you develop side effects. They may be able to recommend ways to relieve them.  Keep all follow-up visits as told by your health care provider. This is important. Where to find more information  American Cancer Society: www.cancer.org  National Cancer Institute (NCI): www.cancer.gov Contact a health care provider if:  You have symptoms of a urinary tract infection. These include: ?   Fever. ? Chills. ? Weakness. ? Muscle aches. ? Abdominal pain. ? Frequent and intense urge to urinate. ? Burning feeling in the bladder or urethra during urination. Get help right away if:  There is blood in your urine.  You cannot urinate.  You have severe pain or other symptoms that do not go  away. Summary  Bladder cancer is an abnormal growth of tissue in the bladder.  This condition is diagnosed based on your medical history, a physical exam, urine tests, lab tests, imaging tests, and your symptoms.  Based on the stage of cancer, surgery, chemotherapy, or a combination of treatments may be recommended.  Consider joining a support group. This may help you learn to cope with the stress of having bladder cancer. This information is not intended to replace advice given to you by your health care provider. Make sure you discuss any questions you have with your health care provider. Document Revised: 01/23/2017 Document Reviewed: 01/15/2016 Elsevier Patient Education  2020 Elsevier Inc.  

## 2020-01-18 NOTE — Progress Notes (Signed)
Urological Symptom Review  Patient is experiencing the following symptoms: Frequent urination Get up at night to urinate   Review of Systems  Gastrointestinal (upper)  : Negative for upper GI symptoms  Gastrointestinal (lower) : Negative for lower GI symptoms  Constitutional : Night Sweats  Skin: Negative for skin symptoms  Eyes: Negative for eye symptoms  Ear/Nose/Throat : Negative for Ear/Nose/Throat symptoms  Hematologic/Lymphatic: Negative for Hematologic/Lymphatic symptoms  Cardiovascular : Leg swelling  Respiratory : Cough  Endocrine: Negative for endocrine symptoms  Musculoskeletal: Negative for musculoskeletal symptoms  Neurological: Negative for neurological symptoms  Psychologic: Negative for psychiatric symptoms

## 2020-01-19 LAB — MICROSCOPIC EXAMINATION
RBC, Urine: NONE SEEN /hpf (ref 0–2)
WBC, UA: >30 /hpf — ABNORMAL HIGH (ref 0–5)

## 2020-01-19 LAB — URINALYSIS, ROUTINE W REFLEX MICROSCOPIC
Bilirubin, UA: NEGATIVE
Glucose, UA: NEGATIVE
Ketones, UA: NEGATIVE
Nitrite, UA: NEGATIVE
Specific Gravity, UA: 1.02 (ref 1.005–1.030)
Urobilinogen, Ur: 0.2 mg/dL (ref 0.2–1.0)
pH, UA: 5.5 (ref 5.0–7.5)

## 2020-01-24 DIAGNOSIS — M159 Polyosteoarthritis, unspecified: Secondary | ICD-10-CM | POA: Diagnosis not present

## 2020-01-24 DIAGNOSIS — I10 Essential (primary) hypertension: Secondary | ICD-10-CM | POA: Diagnosis not present

## 2020-01-24 DIAGNOSIS — E119 Type 2 diabetes mellitus without complications: Secondary | ICD-10-CM | POA: Diagnosis not present

## 2020-02-08 DIAGNOSIS — H34811 Central retinal vein occlusion, right eye, with macular edema: Secondary | ICD-10-CM | POA: Diagnosis not present

## 2020-02-08 DIAGNOSIS — E119 Type 2 diabetes mellitus without complications: Secondary | ICD-10-CM | POA: Diagnosis not present

## 2020-02-08 DIAGNOSIS — H43812 Vitreous degeneration, left eye: Secondary | ICD-10-CM | POA: Diagnosis not present

## 2020-02-08 DIAGNOSIS — H35033 Hypertensive retinopathy, bilateral: Secondary | ICD-10-CM | POA: Diagnosis not present

## 2020-02-23 DIAGNOSIS — E119 Type 2 diabetes mellitus without complications: Secondary | ICD-10-CM | POA: Diagnosis not present

## 2020-02-23 DIAGNOSIS — I10 Essential (primary) hypertension: Secondary | ICD-10-CM | POA: Diagnosis not present

## 2020-02-23 DIAGNOSIS — M159 Polyosteoarthritis, unspecified: Secondary | ICD-10-CM | POA: Diagnosis not present

## 2020-03-09 DIAGNOSIS — R5383 Other fatigue: Secondary | ICD-10-CM | POA: Diagnosis not present

## 2020-03-09 DIAGNOSIS — E1165 Type 2 diabetes mellitus with hyperglycemia: Secondary | ICD-10-CM | POA: Diagnosis not present

## 2020-03-09 DIAGNOSIS — Z79899 Other long term (current) drug therapy: Secondary | ICD-10-CM | POA: Diagnosis not present

## 2020-03-09 DIAGNOSIS — E559 Vitamin D deficiency, unspecified: Secondary | ICD-10-CM | POA: Diagnosis not present

## 2020-03-09 DIAGNOSIS — Z23 Encounter for immunization: Secondary | ICD-10-CM | POA: Diagnosis not present

## 2020-03-09 DIAGNOSIS — Z7189 Other specified counseling: Secondary | ICD-10-CM | POA: Diagnosis not present

## 2020-03-09 DIAGNOSIS — E7800 Pure hypercholesterolemia, unspecified: Secondary | ICD-10-CM | POA: Diagnosis not present

## 2020-03-09 DIAGNOSIS — Z0000 Encounter for general adult medical examination without abnormal findings: Secondary | ICD-10-CM | POA: Diagnosis not present

## 2020-03-09 DIAGNOSIS — Z299 Encounter for prophylactic measures, unspecified: Secondary | ICD-10-CM | POA: Diagnosis not present

## 2020-03-14 DIAGNOSIS — C679 Malignant neoplasm of bladder, unspecified: Secondary | ICD-10-CM | POA: Diagnosis not present

## 2020-03-14 DIAGNOSIS — Z299 Encounter for prophylactic measures, unspecified: Secondary | ICD-10-CM | POA: Diagnosis not present

## 2020-03-14 DIAGNOSIS — R350 Frequency of micturition: Secondary | ICD-10-CM | POA: Diagnosis not present

## 2020-03-14 DIAGNOSIS — G459 Transient cerebral ischemic attack, unspecified: Secondary | ICD-10-CM | POA: Diagnosis not present

## 2020-03-14 DIAGNOSIS — Z87891 Personal history of nicotine dependence: Secondary | ICD-10-CM | POA: Diagnosis not present

## 2020-03-14 DIAGNOSIS — N390 Urinary tract infection, site not specified: Secondary | ICD-10-CM | POA: Diagnosis not present

## 2020-03-14 DIAGNOSIS — R7989 Other specified abnormal findings of blood chemistry: Secondary | ICD-10-CM | POA: Diagnosis not present

## 2020-03-21 DIAGNOSIS — H34811 Central retinal vein occlusion, right eye, with macular edema: Secondary | ICD-10-CM | POA: Diagnosis not present

## 2020-04-18 ENCOUNTER — Other Ambulatory Visit: Payer: Medicare Other | Admitting: Urology

## 2020-05-02 DIAGNOSIS — H34811 Central retinal vein occlusion, right eye, with macular edema: Secondary | ICD-10-CM | POA: Diagnosis not present

## 2020-05-02 DIAGNOSIS — H3582 Retinal ischemia: Secondary | ICD-10-CM | POA: Diagnosis not present

## 2020-05-02 DIAGNOSIS — H35033 Hypertensive retinopathy, bilateral: Secondary | ICD-10-CM | POA: Diagnosis not present

## 2020-05-02 DIAGNOSIS — H43812 Vitreous degeneration, left eye: Secondary | ICD-10-CM | POA: Diagnosis not present

## 2020-05-21 ENCOUNTER — Other Ambulatory Visit: Payer: Self-pay

## 2020-05-21 ENCOUNTER — Ambulatory Visit (INDEPENDENT_AMBULATORY_CARE_PROVIDER_SITE_OTHER): Payer: Medicare Other | Admitting: Urology

## 2020-05-21 ENCOUNTER — Encounter: Payer: Self-pay | Admitting: Urology

## 2020-05-21 VITALS — BP 173/70 | HR 65 | Temp 98.0°F | Ht 64.0 in

## 2020-05-21 DIAGNOSIS — C675 Malignant neoplasm of bladder neck: Secondary | ICD-10-CM | POA: Diagnosis not present

## 2020-05-21 LAB — URINALYSIS, ROUTINE W REFLEX MICROSCOPIC
Bilirubin, UA: NEGATIVE
Glucose, UA: NEGATIVE
Ketones, UA: NEGATIVE
Nitrite, UA: NEGATIVE
Specific Gravity, UA: 1.015 (ref 1.005–1.030)
Urobilinogen, Ur: 0.2 mg/dL (ref 0.2–1.0)
pH, UA: 6 (ref 5.0–7.5)

## 2020-05-21 LAB — MICROSCOPIC EXAMINATION: Epithelial Cells (non renal): >10 /hpf — AB (ref 0–10)

## 2020-05-21 MED ORDER — CIPROFLOXACIN HCL 500 MG PO TABS
500.0000 mg | ORAL_TABLET | Freq: Once | ORAL | Status: AC
  Administered 2020-05-21: 500 mg via ORAL

## 2020-05-21 NOTE — Progress Notes (Signed)
Urological Symptom Review  Patient is experiencing the following symptoms: Get up at night to urinate UTI  Review of Systems  Gastrointestinal (upper)  : Negative for upper GI symptoms  Gastrointestinal (lower) : Negative for lower GI symptoms  Constitutional : Negative for symptoms  Skin: Negative for skin symptoms  Eyes: Negative for eye symptoms  Ear/Nose/Throat : Negative for Ear/Nose/Throat symptoms  Hematologic/Lymphatic: Negative for Hematologic/Lymphatic symptoms  Cardiovascular : Negative for cardiovascular symptoms  Respiratory : Negative3  Endocrine: Negative for endocrine symptoms  Musculoskeletal: Negative for musculoskeletal symptoms  Neurological: Negative for neurological symptoms  Psychologic: Negative for psychiatric symptoms

## 2020-05-21 NOTE — Progress Notes (Signed)
   05/21/20  CC: followup bladder cancer  HPI: Marie Hunt is a 87yo here for folllowup for TaG3 bladder cancer diagnosed in 03/2018.  Blood pressure (!) 173/70, pulse 65, temperature 98 F (36.7 C), temperature source Oral, height 5\' 4"  (1.626 m). NED. A&Ox3.   No respiratory distress   Abd soft, NT, ND Normal external genitalia with patent urethral meatus  Cystoscopy Procedure Note  Patient identification was confirmed, informed consent was obtained, and patient was prepped using Betadine solution.  Lidocaine jelly was administered per urethral meatus.    Procedure: - Flexible cystoscope introduced, without any difficulty.   - Thorough search of the bladder revealed:    normal urethral meatus    normal urothelium    no stones    no ulcers     no tumors    no urethral polyps    no trabeculation  - Ureteral orifices were normal in position and appearance.  Post-Procedure: - Patient tolerated the procedure well  Assessment/ Plan:    Return in about 6 months (around 11/21/2020).  Nicolette Bang, MD

## 2020-05-21 NOTE — Patient Instructions (Signed)
Bladder Cancer  Bladder cancer is a condition in which abnormal tissue (a tumor) grows in the bladder. The bladder is the organ that holds urine. Two tubes (ureters) carry the urine from the kidneys to the bladder. The bladder wall is made of layers of tissue. Cancer that spreads through these layers of the bladder wall becomes more difficult to treat. What are the causes? The cause of this condition is not known. What increases the risk? The following factors may make you more likely to develop this condition:  Smoking.  Working where there are risks (occupational exposures), such as working with rubber, leather, clothing fabric, dyes, chemicals, and paint.  Being 55 years of age or older.  Being female.  Having bladder inflammation that is long-term (chronic).  Having a history of cancer, including: ? A family history of bladder cancer. ? Personal experience with bladder cancer. ? Having had certain treatments for cancer before. These include:  Medicines to kill cancer cells (chemotherapy).  Strong X-ray beams or capsules high in energy to kill cancer cells and shrink tumors (radiation therapy).  Having been exposed to arsenic. This is a chemical element that can poison you. What are the signs or symptoms? Early symptoms of this condition include:  Seeing blood in your urine.  Feeling pain when urinating.  Having infections of your urinary system (urinary tract infections or UTIs) that happen often.  Having to urinate sooner or more often than usual. Later symptoms of this condition include:  Not being able to urinate.  Pain on one side of your lower back.  Loss of appetite.  Weight loss.  Tiredness (fatigue).  Swelling in your feet.  Bone pain. How is this diagnosed? This condition is diagnosed based on:  Your medical history.  A physical exam.  Lab tests, such as urine tests.  Imaging tests.  Your symptoms. You may also have other tests or  procedures done, such as:  A cystoscopy. A narrow tube is inserted into your bladder through the organ that connects your bladder to the outside of your body (urethra). This is done to view the lining of your bladder for tumors.  A biopsy. This procedure involves removing a tissue sample to look at it under a microscope to see if cancer is present. It is important to find out:  How deeply into the bladder wall cancer has grown.  Whether cancer has spread to any other parts of your body. This may require blood tests or imaging tests, such as a CT scan, MRI, bone scan, or X-rays. How is this treated? Your health care provider may recommend one or more types of treatment based on the stage of your cancer. The most common types of treatment are:  Surgery to remove the cancer. Procedures that may be done include: ? Removing a tumor on the inside wall of the bladder (transurethral resection). ? Removing the bladder (cystectomy).  Radiation therapy. This is often used together with chemotherapy.  Chemotherapy.  Immunotherapy. This uses medicines to help your immune system destroy cancer cells. Follow these instructions at home:  Take over-the-counter and prescription medicines only as told by your health care provider.  Eat a healthy diet. Some of your treatments might affect your appetite.  Do not use any products that contain nicotine or tobacco, such as cigarettes, e-cigarettes, and chewing tobacco. If you need help quitting, ask your health care provider.  Consider joining a support group. This may help you learn to cope with the stress of having   bladder cancer.  Tell your cancer care team if you develop side effects. Your team may be able to recommend ways to get relief.  Keep all follow-up visits as told by your health care provider. This is important. Where to find more information  American Cancer Society: www.cancer.org  National Cancer Institute (NCI):  www.cancer.gov Contact a health care provider if:  You have symptoms of a urinary tract infection. These include: ? Fever. ? Chills. ? Weakness. ? Muscle aches. ? Pain in your abdomen. ? Urge to urinate that is stronger and happens more often than usual. ? Burning feeling in the bladder or urethra when you urinate. Get help right away if:  There is blood in your urine.  You cannot urinate.  You have severe pain or other symptoms that do not go away. Summary  Bladder cancer is a condition in which tumors grow in the bladder and cause illness.  This condition is diagnosed based on your medical history, a physical exam, lab tests, imaging tests, and your symptoms.  Your health care provider may recommend one or more types of treatment based on the stage of your cancer.  Consider joining a support group. This may help you learn to cope with the stress of having bladder cancer. This information is not intended to replace advice given to you by your health care provider. Make sure you discuss any questions you have with your health care provider. Document Revised: 10/20/2018 Document Reviewed: 10/20/2018 Elsevier Patient Education  2021 Elsevier Inc.  

## 2020-06-06 DIAGNOSIS — H34811 Central retinal vein occlusion, right eye, with macular edema: Secondary | ICD-10-CM | POA: Diagnosis not present

## 2020-06-12 DIAGNOSIS — N184 Chronic kidney disease, stage 4 (severe): Secondary | ICD-10-CM | POA: Diagnosis not present

## 2020-06-12 DIAGNOSIS — E1165 Type 2 diabetes mellitus with hyperglycemia: Secondary | ICD-10-CM | POA: Diagnosis not present

## 2020-06-12 DIAGNOSIS — I10 Essential (primary) hypertension: Secondary | ICD-10-CM | POA: Diagnosis not present

## 2020-06-12 DIAGNOSIS — E1122 Type 2 diabetes mellitus with diabetic chronic kidney disease: Secondary | ICD-10-CM | POA: Diagnosis not present

## 2020-06-12 DIAGNOSIS — Z299 Encounter for prophylactic measures, unspecified: Secondary | ICD-10-CM | POA: Diagnosis not present

## 2020-06-12 DIAGNOSIS — C679 Malignant neoplasm of bladder, unspecified: Secondary | ICD-10-CM | POA: Diagnosis not present

## 2020-07-11 DIAGNOSIS — H34811 Central retinal vein occlusion, right eye, with macular edema: Secondary | ICD-10-CM | POA: Diagnosis not present

## 2020-07-11 DIAGNOSIS — H43812 Vitreous degeneration, left eye: Secondary | ICD-10-CM | POA: Diagnosis not present

## 2020-07-19 DIAGNOSIS — Z7984 Long term (current) use of oral hypoglycemic drugs: Secondary | ICD-10-CM | POA: Diagnosis not present

## 2020-07-19 DIAGNOSIS — H524 Presbyopia: Secondary | ICD-10-CM | POA: Diagnosis not present

## 2020-07-19 DIAGNOSIS — E113293 Type 2 diabetes mellitus with mild nonproliferative diabetic retinopathy without macular edema, bilateral: Secondary | ICD-10-CM | POA: Diagnosis not present

## 2020-07-19 DIAGNOSIS — Z961 Presence of intraocular lens: Secondary | ICD-10-CM | POA: Diagnosis not present

## 2020-08-22 DIAGNOSIS — H3582 Retinal ischemia: Secondary | ICD-10-CM | POA: Diagnosis not present

## 2020-08-22 DIAGNOSIS — H35033 Hypertensive retinopathy, bilateral: Secondary | ICD-10-CM | POA: Diagnosis not present

## 2020-08-22 DIAGNOSIS — H34811 Central retinal vein occlusion, right eye, with macular edema: Secondary | ICD-10-CM | POA: Diagnosis not present

## 2020-08-22 DIAGNOSIS — H35372 Puckering of macula, left eye: Secondary | ICD-10-CM | POA: Diagnosis not present

## 2020-08-22 DIAGNOSIS — H43812 Vitreous degeneration, left eye: Secondary | ICD-10-CM | POA: Diagnosis not present

## 2020-09-17 DIAGNOSIS — E1165 Type 2 diabetes mellitus with hyperglycemia: Secondary | ICD-10-CM | POA: Diagnosis not present

## 2020-09-17 DIAGNOSIS — Z299 Encounter for prophylactic measures, unspecified: Secondary | ICD-10-CM | POA: Diagnosis not present

## 2020-09-17 DIAGNOSIS — E1122 Type 2 diabetes mellitus with diabetic chronic kidney disease: Secondary | ICD-10-CM | POA: Diagnosis not present

## 2020-09-17 DIAGNOSIS — G459 Transient cerebral ischemic attack, unspecified: Secondary | ICD-10-CM | POA: Diagnosis not present

## 2020-09-17 DIAGNOSIS — I10 Essential (primary) hypertension: Secondary | ICD-10-CM | POA: Diagnosis not present

## 2020-10-04 DIAGNOSIS — H34811 Central retinal vein occlusion, right eye, with macular edema: Secondary | ICD-10-CM | POA: Diagnosis not present

## 2020-11-13 DIAGNOSIS — K5900 Constipation, unspecified: Secondary | ICD-10-CM | POA: Diagnosis not present

## 2020-11-13 DIAGNOSIS — Z6824 Body mass index (BMI) 24.0-24.9, adult: Secondary | ICD-10-CM | POA: Diagnosis not present

## 2020-11-13 DIAGNOSIS — E1165 Type 2 diabetes mellitus with hyperglycemia: Secondary | ICD-10-CM | POA: Diagnosis not present

## 2020-11-13 DIAGNOSIS — Z299 Encounter for prophylactic measures, unspecified: Secondary | ICD-10-CM | POA: Diagnosis not present

## 2020-11-13 DIAGNOSIS — I10 Essential (primary) hypertension: Secondary | ICD-10-CM | POA: Diagnosis not present

## 2020-11-15 DIAGNOSIS — H3582 Retinal ischemia: Secondary | ICD-10-CM | POA: Diagnosis not present

## 2020-11-15 DIAGNOSIS — H34811 Central retinal vein occlusion, right eye, with macular edema: Secondary | ICD-10-CM | POA: Diagnosis not present

## 2020-11-15 DIAGNOSIS — H35372 Puckering of macula, left eye: Secondary | ICD-10-CM | POA: Diagnosis not present

## 2020-11-15 DIAGNOSIS — H35033 Hypertensive retinopathy, bilateral: Secondary | ICD-10-CM | POA: Diagnosis not present

## 2020-11-15 DIAGNOSIS — H43812 Vitreous degeneration, left eye: Secondary | ICD-10-CM | POA: Diagnosis not present

## 2020-11-19 ENCOUNTER — Ambulatory Visit: Payer: Medicaid Other | Admitting: Urology

## 2020-12-17 ENCOUNTER — Ambulatory Visit: Payer: Medicare Other | Admitting: Urology

## 2020-12-17 ENCOUNTER — Ambulatory Visit (INDEPENDENT_AMBULATORY_CARE_PROVIDER_SITE_OTHER): Payer: Medicare Other | Admitting: Urology

## 2020-12-17 ENCOUNTER — Other Ambulatory Visit: Payer: Self-pay

## 2020-12-17 ENCOUNTER — Encounter: Payer: Self-pay | Admitting: Urology

## 2020-12-17 VITALS — BP 195/66 | HR 68 | Temp 98.7°F

## 2020-12-17 DIAGNOSIS — C675 Malignant neoplasm of bladder neck: Secondary | ICD-10-CM

## 2020-12-17 LAB — URINALYSIS, ROUTINE W REFLEX MICROSCOPIC
Bilirubin, UA: NEGATIVE
Glucose, UA: NEGATIVE
Ketones, UA: NEGATIVE
Nitrite, UA: NEGATIVE
Specific Gravity, UA: 1.02 (ref 1.005–1.030)
Urobilinogen, Ur: 0.2 mg/dL (ref 0.2–1.0)
pH, UA: 6 (ref 5.0–7.5)

## 2020-12-17 MED ORDER — CIPROFLOXACIN HCL 500 MG PO TABS
500.0000 mg | ORAL_TABLET | Freq: Once | ORAL | Status: AC
  Administered 2020-12-17: 500 mg via ORAL

## 2020-12-17 NOTE — Progress Notes (Signed)
   12/17/20  CC: followup bladder cancer   HPI: Ms Dassow is a 27ZD here for followup for bladder cancer. No hematuria or dysuria. Blood pressure (!) 195/66, pulse 68, temperature 98.7 F (37.1 C). NED. A&Ox3.   No respiratory distress   Abd soft, NT, ND Normal external genitalia with patent urethral meatus  Cystoscopy Procedure Note  Patient identification was confirmed, informed consent was obtained, and patient was prepped using Betadine solution.  Lidocaine jelly was administered per urethral meatus.    Procedure: - Flexible cystoscope introduced, without any difficulty.   - Thorough search of the bladder revealed:    normal urethral meatus    normal urothelium    no stones    no ulcers     no tumors    no urethral polyps    no trabeculation  - Ureteral orifices were normal in position and appearance.  Post-Procedure: - Patient tolerated the procedure well  Assessment/ Plan: RTC 6 months for cystoscopy  Nicolette Bang, MD

## 2020-12-17 NOTE — Patient Instructions (Signed)
Bladder Cancer Bladder cancer is a condition in which abnormal tissue (a tumor) grows in the bladder. The bladder is the organ that holds urine. Two tubes (ureters) carry the urine from the kidneys to the bladder. The bladder wall is made of layers of tissue. Cancer that spreads through these layers of the bladder wall becomes more difficult to treat. What are the causes? The cause of this condition is not known. What increases the risk? The following factors may make you more likely to develop this condition: Smoking. Working where there are risks (occupational exposures), such as working with rubber, leather, clothing fabric, dyes, chemicals, and paint. Being 55 years of age or older. Being female. Having bladder inflammation that is long-term (chronic). Having a history of cancer, including: A family history of bladder cancer. Personal experience with bladder cancer. Having had certain treatments for cancer before. These include: Medicines to kill cancer cells (chemotherapy). Strong X-ray beams or capsules high in energy to kill cancer cells and shrink tumors (radiation therapy). Having been exposed to arsenic. This is a chemical element that can poison you. What are the signs or symptoms? Early symptoms of this condition include: Seeing blood in your urine. Feeling pain when urinating. Having infections of your urinary system (urinary tract infections or UTIs) that happen often. Having to urinate sooner or more often than usual. Later symptoms of this condition include: Not being able to urinate. Pain on one side of your lower back. Loss of appetite. Weight loss. Tiredness (fatigue). Swelling in your feet. Bone pain. How is this diagnosed? This condition is diagnosed based on: Your medical history. A physical exam. Lab tests, such as urine tests. Imaging tests. Your symptoms. You may also have other tests or procedures done, such as: A cystoscopy. A narrow tube is inserted  into your bladder through the organ that connects your bladder to the outside of your body (urethra). This is done to view the lining of your bladder for tumors. A biopsy. This procedure involves removing a tissue sample to look at it under a microscope to see if cancer is present. It is important to find out: How deeply into the bladder wall cancer has grown. Whether cancer has spread to any other parts of your body. This may require blood tests or imaging tests, such as a CT scan, MRI, bone scan, or X-rays. How is this treated? Your health care provider may recommend one or more types of treatment based on the stage of your cancer. The most common types of treatment are: Surgery to remove the cancer. Procedures that may be done include: Removing a tumor on the inside wall of the bladder (transurethral resection). Removing the bladder (cystectomy). Radiation therapy. This is often used together with chemotherapy. Chemotherapy. Immunotherapy. This uses medicines to help your immune system destroy cancer cells. Follow these instructions at home: Take over-the-counter and prescription medicines only as told by your health care provider. Eat a healthy diet. Some of your treatments might affect your appetite. Do not use any products that contain nicotine or tobacco, such as cigarettes, e-cigarettes, and chewing tobacco. If you need help quitting, ask your health care provider. Consider joining a support group. This may help you learn to cope with the stress of having bladder cancer. Tell your cancer care team if you develop side effects. Your team may be able to recommend ways to get relief. Keep all follow-up visits as told by your health care provider. This is important. Where to find more information American   Cancer Society: www.cancer.org National Cancer Institute (NCI): www.cancer.gov Contact a health care provider if: You have symptoms of a urinary tract infection. These  include: Fever. Chills. Weakness. Muscle aches. Pain in your abdomen. Urge to urinate that is stronger and happens more often than usual. Burning feeling in the bladder or urethra when you urinate. Get help right away if: There is blood in your urine. You cannot urinate. You have severe pain or other symptoms that do not go away. Summary Bladder cancer is a condition in which tumors grow in the bladder and cause illness. This condition is diagnosed based on your medical history, a physical exam, lab tests, imaging tests, and your symptoms. Your health care provider may recommend one or more types of treatment based on the stage of your cancer. Consider joining a support group. This may help you learn to cope with the stress of having bladder cancer. This information is not intended to replace advice given to you by your health care provider. Make sure you discuss any questions you have with your health care provider. Document Revised: 10/20/2018 Document Reviewed: 10/20/2018 Elsevier Patient Education  2022 Elsevier Inc.  

## 2020-12-17 NOTE — Progress Notes (Signed)
Urological Symptom Review  Patient is experiencing the following symptoms: Frequent urination Get up at night to urinate   Review of Systems  Gastrointestinal (upper)  : Negative for upper GI symptoms  Gastrointestinal (lower) : Constipation  Constitutional : Negative for symptoms  Skin: Negative for skin symptoms  Eyes: Negative for eye symptoms  Ear/Nose/Throat : Negative for Ear/Nose/Throat symptoms  Hematologic/Lymphatic: Negative for Hematologic/Lymphatic symptoms  Cardiovascular : Leg swelling  Respiratory : Negative for respiratory symptoms  Endocrine: Negative for endocrine symptoms  Musculoskeletal: Negative for musculoskeletal symptoms  Neurological: Negative for neurological symptoms  Psychologic: Negative for psychiatric symptoms

## 2020-12-21 DIAGNOSIS — E1165 Type 2 diabetes mellitus with hyperglycemia: Secondary | ICD-10-CM | POA: Diagnosis not present

## 2020-12-21 DIAGNOSIS — M545 Low back pain, unspecified: Secondary | ICD-10-CM | POA: Diagnosis not present

## 2020-12-21 DIAGNOSIS — I10 Essential (primary) hypertension: Secondary | ICD-10-CM | POA: Diagnosis not present

## 2020-12-21 DIAGNOSIS — Z299 Encounter for prophylactic measures, unspecified: Secondary | ICD-10-CM | POA: Diagnosis not present

## 2020-12-21 DIAGNOSIS — I509 Heart failure, unspecified: Secondary | ICD-10-CM | POA: Diagnosis not present

## 2020-12-21 DIAGNOSIS — Z23 Encounter for immunization: Secondary | ICD-10-CM | POA: Diagnosis not present

## 2020-12-27 DIAGNOSIS — H43812 Vitreous degeneration, left eye: Secondary | ICD-10-CM | POA: Diagnosis not present

## 2020-12-27 DIAGNOSIS — H34811 Central retinal vein occlusion, right eye, with macular edema: Secondary | ICD-10-CM | POA: Diagnosis not present

## 2021-02-07 DIAGNOSIS — E119 Type 2 diabetes mellitus without complications: Secondary | ICD-10-CM | POA: Diagnosis not present

## 2021-02-07 DIAGNOSIS — H35372 Puckering of macula, left eye: Secondary | ICD-10-CM | POA: Diagnosis not present

## 2021-02-07 DIAGNOSIS — H35033 Hypertensive retinopathy, bilateral: Secondary | ICD-10-CM | POA: Diagnosis not present

## 2021-02-07 DIAGNOSIS — H34811 Central retinal vein occlusion, right eye, with macular edema: Secondary | ICD-10-CM | POA: Diagnosis not present

## 2021-03-06 DIAGNOSIS — I10 Essential (primary) hypertension: Secondary | ICD-10-CM | POA: Diagnosis not present

## 2021-03-06 DIAGNOSIS — Z299 Encounter for prophylactic measures, unspecified: Secondary | ICD-10-CM | POA: Diagnosis not present

## 2021-03-06 DIAGNOSIS — R410 Disorientation, unspecified: Secondary | ICD-10-CM | POA: Diagnosis not present

## 2021-03-06 DIAGNOSIS — E1165 Type 2 diabetes mellitus with hyperglycemia: Secondary | ICD-10-CM | POA: Diagnosis not present

## 2021-03-06 DIAGNOSIS — Z87891 Personal history of nicotine dependence: Secondary | ICD-10-CM | POA: Diagnosis not present

## 2021-03-06 DIAGNOSIS — Z2821 Immunization not carried out because of patient refusal: Secondary | ICD-10-CM | POA: Diagnosis not present

## 2021-03-06 DIAGNOSIS — N390 Urinary tract infection, site not specified: Secondary | ICD-10-CM | POA: Diagnosis not present

## 2021-03-12 DIAGNOSIS — J449 Chronic obstructive pulmonary disease, unspecified: Secondary | ICD-10-CM | POA: Diagnosis not present

## 2021-03-12 DIAGNOSIS — Z299 Encounter for prophylactic measures, unspecified: Secondary | ICD-10-CM | POA: Diagnosis not present

## 2021-03-12 DIAGNOSIS — I10 Essential (primary) hypertension: Secondary | ICD-10-CM | POA: Diagnosis not present

## 2021-03-12 DIAGNOSIS — Z6823 Body mass index (BMI) 23.0-23.9, adult: Secondary | ICD-10-CM | POA: Diagnosis not present

## 2021-03-12 DIAGNOSIS — Z87891 Personal history of nicotine dependence: Secondary | ICD-10-CM | POA: Diagnosis not present

## 2021-03-12 DIAGNOSIS — Z0000 Encounter for general adult medical examination without abnormal findings: Secondary | ICD-10-CM | POA: Diagnosis not present

## 2021-03-12 DIAGNOSIS — J9811 Atelectasis: Secondary | ICD-10-CM | POA: Diagnosis not present

## 2021-03-12 DIAGNOSIS — R059 Cough, unspecified: Secondary | ICD-10-CM | POA: Diagnosis not present

## 2021-03-12 DIAGNOSIS — E1165 Type 2 diabetes mellitus with hyperglycemia: Secondary | ICD-10-CM | POA: Diagnosis not present

## 2021-03-12 DIAGNOSIS — Z7189 Other specified counseling: Secondary | ICD-10-CM | POA: Diagnosis not present

## 2021-03-12 DIAGNOSIS — R350 Frequency of micturition: Secondary | ICD-10-CM | POA: Diagnosis not present

## 2021-03-13 DIAGNOSIS — I10 Essential (primary) hypertension: Secondary | ICD-10-CM | POA: Diagnosis not present

## 2021-03-13 DIAGNOSIS — R413 Other amnesia: Secondary | ICD-10-CM | POA: Diagnosis not present

## 2021-03-21 DIAGNOSIS — H34811 Central retinal vein occlusion, right eye, with macular edema: Secondary | ICD-10-CM | POA: Diagnosis not present

## 2021-03-21 DIAGNOSIS — H43812 Vitreous degeneration, left eye: Secondary | ICD-10-CM | POA: Diagnosis not present

## 2021-03-26 DIAGNOSIS — I739 Peripheral vascular disease, unspecified: Secondary | ICD-10-CM | POA: Diagnosis not present

## 2021-03-26 DIAGNOSIS — I10 Essential (primary) hypertension: Secondary | ICD-10-CM | POA: Diagnosis not present

## 2021-03-26 DIAGNOSIS — E1165 Type 2 diabetes mellitus with hyperglycemia: Secondary | ICD-10-CM | POA: Diagnosis not present

## 2021-03-26 DIAGNOSIS — R413 Other amnesia: Secondary | ICD-10-CM | POA: Diagnosis not present

## 2021-03-26 DIAGNOSIS — Z87891 Personal history of nicotine dependence: Secondary | ICD-10-CM | POA: Diagnosis not present

## 2021-03-26 DIAGNOSIS — Z299 Encounter for prophylactic measures, unspecified: Secondary | ICD-10-CM | POA: Diagnosis not present

## 2021-03-26 DIAGNOSIS — N184 Chronic kidney disease, stage 4 (severe): Secondary | ICD-10-CM | POA: Diagnosis not present

## 2021-05-06 DIAGNOSIS — H34811 Central retinal vein occlusion, right eye, with macular edema: Secondary | ICD-10-CM | POA: Diagnosis not present

## 2021-05-06 DIAGNOSIS — H35372 Puckering of macula, left eye: Secondary | ICD-10-CM | POA: Diagnosis not present

## 2021-05-06 DIAGNOSIS — H35033 Hypertensive retinopathy, bilateral: Secondary | ICD-10-CM | POA: Diagnosis not present

## 2021-05-06 DIAGNOSIS — H3582 Retinal ischemia: Secondary | ICD-10-CM | POA: Diagnosis not present

## 2021-05-06 DIAGNOSIS — H43812 Vitreous degeneration, left eye: Secondary | ICD-10-CM | POA: Diagnosis not present

## 2021-06-17 DIAGNOSIS — H34811 Central retinal vein occlusion, right eye, with macular edema: Secondary | ICD-10-CM | POA: Diagnosis not present

## 2021-06-17 DIAGNOSIS — H43812 Vitreous degeneration, left eye: Secondary | ICD-10-CM | POA: Diagnosis not present

## 2021-06-24 ENCOUNTER — Other Ambulatory Visit: Payer: Medicaid Other | Admitting: Urology

## 2021-06-25 DIAGNOSIS — E1122 Type 2 diabetes mellitus with diabetic chronic kidney disease: Secondary | ICD-10-CM | POA: Diagnosis not present

## 2021-06-25 DIAGNOSIS — Z299 Encounter for prophylactic measures, unspecified: Secondary | ICD-10-CM | POA: Diagnosis not present

## 2021-06-25 DIAGNOSIS — I10 Essential (primary) hypertension: Secondary | ICD-10-CM | POA: Diagnosis not present

## 2021-06-25 DIAGNOSIS — E1165 Type 2 diabetes mellitus with hyperglycemia: Secondary | ICD-10-CM | POA: Diagnosis not present

## 2021-06-25 DIAGNOSIS — Z6824 Body mass index (BMI) 24.0-24.9, adult: Secondary | ICD-10-CM | POA: Diagnosis not present

## 2021-07-01 ENCOUNTER — Ambulatory Visit (INDEPENDENT_AMBULATORY_CARE_PROVIDER_SITE_OTHER): Payer: Medicare Other | Admitting: Urology

## 2021-07-01 ENCOUNTER — Encounter: Payer: Self-pay | Admitting: Urology

## 2021-07-01 VITALS — BP 145/67 | HR 76

## 2021-07-01 DIAGNOSIS — Z8551 Personal history of malignant neoplasm of bladder: Secondary | ICD-10-CM | POA: Diagnosis not present

## 2021-07-01 DIAGNOSIS — C675 Malignant neoplasm of bladder neck: Secondary | ICD-10-CM

## 2021-07-01 MED ORDER — CIPROFLOXACIN HCL 500 MG PO TABS
500.0000 mg | ORAL_TABLET | Freq: Once | ORAL | Status: AC
  Administered 2021-07-01: 500 mg via ORAL

## 2021-07-01 NOTE — Progress Notes (Signed)
Patient spilled urine ?

## 2021-07-01 NOTE — Patient Instructions (Signed)

## 2021-07-01 NOTE — Progress Notes (Signed)
? ?  07/01/21 ? ?CC: followup high grade bladder cancer ? ? ?HPI: ?Ms Siguenza is a 88yo here for followup for high grade bladder cancer. Cancer was diagnosed 03/2019. No worsening LUTS. No hematuria ?Blood pressure (!) 145/67, pulse 76. ?NED. A&Ox3.   ?No respiratory distress   ?Abd soft, NT, ND ?Normal external genitalia with patent urethral meatus ? ?Cystoscopy Procedure Note ? ?Patient identification was confirmed, informed consent was obtained, and patient was prepped using Betadine solution.  Lidocaine jelly was administered per urethral meatus.   ? ?Procedure: ?- Flexible cystoscope introduced, without any difficulty.   ?- Thorough search of the bladder revealed: ?   normal urethral meatus ?   normal urothelium ?   no stones ?   no ulcers  ?   no tumors ?   no urethral polyps ?   no trabeculation ? ?- Ureteral orifices were normal in position and appearance. ? ?Post-Procedure: ?- Patient tolerated the procedure well ? ?Assessment/ Plan: ?RTC 6 months for cystoscopy ? ? No follow-ups on file. ? ?Nicolette Bang, MD  ?

## 2021-07-25 DIAGNOSIS — H3582 Retinal ischemia: Secondary | ICD-10-CM | POA: Diagnosis not present

## 2021-07-25 DIAGNOSIS — H43812 Vitreous degeneration, left eye: Secondary | ICD-10-CM | POA: Diagnosis not present

## 2021-07-25 DIAGNOSIS — H34811 Central retinal vein occlusion, right eye, with macular edema: Secondary | ICD-10-CM | POA: Diagnosis not present

## 2021-07-25 DIAGNOSIS — H35033 Hypertensive retinopathy, bilateral: Secondary | ICD-10-CM | POA: Diagnosis not present

## 2021-07-25 DIAGNOSIS — H35372 Puckering of macula, left eye: Secondary | ICD-10-CM | POA: Diagnosis not present

## 2021-08-09 ENCOUNTER — Other Ambulatory Visit: Payer: Self-pay

## 2021-08-09 ENCOUNTER — Encounter (HOSPITAL_COMMUNITY): Payer: Self-pay | Admitting: Emergency Medicine

## 2021-08-09 ENCOUNTER — Emergency Department (HOSPITAL_COMMUNITY)
Admission: EM | Admit: 2021-08-09 | Discharge: 2021-08-09 | Disposition: A | Discharge status: Home / Self Care | Payer: Medicare Other | Attending: Emergency Medicine | Admitting: Emergency Medicine

## 2021-08-09 DIAGNOSIS — R112 Nausea with vomiting, unspecified: Secondary | ICD-10-CM | POA: Diagnosis not present

## 2021-08-09 DIAGNOSIS — R531 Weakness: Secondary | ICD-10-CM | POA: Diagnosis not present

## 2021-08-09 DIAGNOSIS — D72829 Elevated white blood cell count, unspecified: Secondary | ICD-10-CM | POA: Diagnosis not present

## 2021-08-09 DIAGNOSIS — R1110 Vomiting, unspecified: Secondary | ICD-10-CM | POA: Insufficient documentation

## 2021-08-09 DIAGNOSIS — Z7982 Long term (current) use of aspirin: Secondary | ICD-10-CM | POA: Insufficient documentation

## 2021-08-09 DIAGNOSIS — R1111 Vomiting without nausea: Secondary | ICD-10-CM

## 2021-08-09 DIAGNOSIS — R42 Dizziness and giddiness: Secondary | ICD-10-CM | POA: Insufficient documentation

## 2021-08-09 LAB — URINALYSIS, ROUTINE W REFLEX MICROSCOPIC
Bacteria, UA: NONE SEEN
Bilirubin Urine: NEGATIVE
Glucose, UA: NEGATIVE mg/dL
Hgb urine dipstick: NEGATIVE
Ketones, ur: NEGATIVE mg/dL
Nitrite: NEGATIVE
Protein, ur: 30 mg/dL — AB
Specific Gravity, Urine: 1.006 (ref 1.005–1.030)
pH: 7 (ref 5.0–8.0)

## 2021-08-09 LAB — CBC WITH DIFFERENTIAL/PLATELET
Abs Immature Granulocytes: 0.03 10*3/uL (ref 0.00–0.07)
Basophils Absolute: 0.1 10*3/uL (ref 0.0–0.1)
Basophils Relative: 1 %
Eosinophils Absolute: 0 10*3/uL (ref 0.0–0.5)
Eosinophils Relative: 0 %
HCT: 35.7 % — ABNORMAL LOW (ref 36.0–46.0)
Hemoglobin: 11.5 g/dL — ABNORMAL LOW (ref 12.0–15.0)
Immature Granulocytes: 0 %
Lymphocytes Relative: 12 %
Lymphs Abs: 1.1 10*3/uL (ref 0.7–4.0)
MCH: 30.6 pg (ref 26.0–34.0)
MCHC: 32.2 g/dL (ref 30.0–36.0)
MCV: 94.9 fL (ref 80.0–100.0)
Monocytes Absolute: 0.3 10*3/uL (ref 0.1–1.0)
Monocytes Relative: 3 %
Neutro Abs: 8.4 10*3/uL — ABNORMAL HIGH (ref 1.7–7.7)
Neutrophils Relative %: 84 %
Platelets: 175 10*3/uL (ref 150–400)
RBC: 3.76 MIL/uL — ABNORMAL LOW (ref 3.87–5.11)
RDW: 13.3 % (ref 11.5–15.5)
WBC: 9.9 10*3/uL (ref 4.0–10.5)
nRBC: 0 % (ref 0.0–0.2)

## 2021-08-09 LAB — BASIC METABOLIC PANEL
Anion gap: 10 (ref 5–15)
BUN: 43 mg/dL — ABNORMAL HIGH (ref 8–23)
CO2: 25 mmol/L (ref 22–32)
Calcium: 9.4 mg/dL (ref 8.9–10.3)
Chloride: 103 mmol/L (ref 98–111)
Creatinine, Ser: 2.02 mg/dL — ABNORMAL HIGH (ref 0.44–1.00)
GFR, Estimated: 24 mL/min — ABNORMAL LOW (ref >60)
Glucose, Bld: 156 mg/dL — ABNORMAL HIGH (ref 70–99)
Potassium: 4.4 mmol/L (ref 3.5–5.1)
Sodium: 138 mmol/L (ref 135–145)

## 2021-08-09 LAB — TROPONIN I (HIGH SENSITIVITY)
Troponin I (High Sensitivity): 40 ng/L — ABNORMAL HIGH (ref <18)
Troponin I (High Sensitivity): 48 ng/L — ABNORMAL HIGH

## 2021-08-09 LAB — CBG MONITORING, ED: Glucose-Capillary: 153 mg/dL — ABNORMAL HIGH (ref 70–99)

## 2021-08-09 MED ORDER — CEPHALEXIN 500 MG PO CAPS: 500.0000 mg | ORAL_CAPSULE | Freq: Three times a day (TID) | ORAL | 0 refills | Status: AC

## 2021-08-09 MED ORDER — ONDANSETRON 4 MG PO TBDP: 4.0000 mg | ORAL_TABLET | Freq: Three times a day (TID) | ORAL | 0 refills | Status: DC | PRN

## 2021-08-09 NOTE — Discharge Instructions (Signed)
Call your primary care doctor or specialist as discussed in the next 2-3 days.   Return immediately back to the ER if:  Your symptoms worsen within the next 12-24 hours. You develop new symptoms such as new fevers, persistent vomiting, new pain, shortness of breath, or new weakness or numbness, or if you have any other concerns.  

## 2021-08-09 NOTE — ED Notes (Signed)
Patient given water at this time per approval from EDP

## 2021-08-09 NOTE — ED Provider Notes (Signed)
[facility] EMERGENCY DEPARTMENT Provider Note   CSN: [id number] Arrival date & time: 08/09/21  1029     History  Chief Complaint  Patient presents with   Weakness    Marie Hunt is a 88 y.o. female.  Patient presents to the ER today feeling weak and dizzy with nausea.  Symptoms have since resolved by time she arrived to the ER.  However family states that she tried to drink a Glucerna earlier in the morning and threw it up.  She denies any headache or chest pain no abdominal pain no fever no cough no vomiting no diarrhea.       Home Medications Prior to Admission medications   Medication Sig Start Date End Date Taking? Authorizing Provider  allopurinol (ZYLOPRIM) 100 MG tablet Take 100 mg by mouth every morning.    Yes [provider]  amLODipine (NORVASC) 5 MG tablet Take 10 mg by mouth every morning.   Yes [provider]  aspirin EC 81 MG tablet Take 81 mg by mouth every evening.    Yes [provider]  Biotin 1000 MCG tablet Take 1,000 mcg by mouth every evening.    Yes [provider]  cephALEXin (KEFLEX) 500 MG capsule Take 1 capsule (500 mg total) by mouth 3 (three) times daily for 5 days. 08/09/21 08/14/21 Yes Luna Fuse, MD  diclofenac Sodium (VOLTAREN) 1 % GEL Apply 2 g topically daily as needed (for pain). 03/25/19  Yes [provider]  famotidine (PEPCID) 20 MG tablet Take 20 mg by mouth at bedtime.    Yes [provider]  folic acid (FOLVITE) 528 MCG tablet Take 400 mcg by mouth every morning.    Yes [provider]  furosemide (LASIX) 40 MG tablet Take 1 tablet (40 mg total) by mouth daily as needed for fluid. Patient taking differently: Take 20 mg by mouth 2 (two) times daily. 10/07/14  Yes Memon, Jolaine Artist, MD  glimepiride (AMARYL) 2 MG tablet Take 2 mg by mouth daily before breakfast.   Yes [provider]  Glycerin-Hypromellose-PEG 400 (DRY EYE RELIEF DROPS) 0.2-0.2-1 % SOLN Apply 1  drop to eye daily as needed (dry eyes).   Yes [provider]  levothyroxine (SYNTHROID, LEVOTHROID) 25 MCG tablet Take 25 mcg by mouth daily before breakfast.   Yes [provider]  metoprolol succinate (TOPROL-XL) 25 MG 24 hr tablet Take 25 mg by mouth every morning.   Yes [provider]  ondansetron (ZOFRAN-ODT) 4 MG disintegrating tablet Take 1 tablet (4 mg total) by mouth every 8 (eight) hours as needed for nausea or vomiting. 08/09/21  Yes [name], Greggory Brandy, MD  PAZEO 0.7 % SOLN Place 1 drop into both eyes daily. 03/27/16  Yes [provider]  simvastatin (ZOCOR) 20 MG tablet Take 20 mg by mouth at bedtime.   Yes [provider]  calcium-vitamin D (OSCAL WITH D) 500-200 MG-UNIT tablet Take 1 tablet by mouth. Patient not taking: Reported on 07/01/2021    [provider]  diphenhydrAMINE (BENADRYL) 2 % cream Apply 1 application topically 3 (three) times daily as needed for itching (rash). Patient not taking: Reported on 07/01/2021    [provider]  Global Inject Ease Lancets 30G MISC See admin instructions. 06/23/19   [provider]  [facility] VERIO test strip 1 each daily. 06/23/19   [provider]  predniSONE (DELTASONE) 5 MG tablet Take 5-30 mg by mouth See admin instructions. Take as directed per package  instructions starting after surgical procedure Patient not taking: Reported on 07/01/2021    [provider]      Allergies    Codeine    Review of Systems   Review of Systems  Constitutional:  Negative for fever.  HENT:  Negative for ear pain.   Eyes:  Negative for pain.  Respiratory:  Negative for cough.   Cardiovascular:  Negative for chest pain.  Gastrointestinal:  Negative for abdominal pain.  Genitourinary:  Negative for flank pain.  Musculoskeletal:  Negative for back pain.  Skin:  Negative for rash.  Neurological:  Negative for headaches.    Physical Exam Updated Vital Signs BP (!)  150/52   Pulse 66   Temp 98.2 F (36.8 C) (Oral)   Resp 18   Ht '5\' 4"'$  (1.626 m)   Wt 61.2 kg   SpO2 99%   BMI 23.17 kg/m  Physical Exam Constitutional:      General: She is not in acute distress.    Appearance: Normal appearance.  HENT:     Head: Normocephalic.     Nose: Nose normal.  Eyes:     Extraocular Movements: Extraocular movements intact.  Cardiovascular:     Rate and Rhythm: Normal rate.  Pulmonary:     Effort: Pulmonary effort is normal.  Musculoskeletal:        General: Normal range of motion.     Cervical back: Normal range of motion.  Neurological:     General: No focal deficit present.     Mental Status: She is alert and oriented to person, place, and time. Mental status is at baseline.     Cranial Nerves: No cranial nerve deficit.     Motor: No weakness.     ED Results / Procedures / Treatments   Labs (all labs ordered are listed, but only abnormal results are displayed) Labs Reviewed  CBC WITH DIFFERENTIAL/PLATELET - Abnormal; Notable for the following components:      Result Value   RBC 3.76 (*)    Hemoglobin 11.5 (*)    HCT 35.7 (*)    Neutro Abs 8.4 (*)    All other components within normal limits  BASIC METABOLIC PANEL - Abnormal; Notable for the following components:   Glucose, Bld 156 (*)    BUN 43 (*)    Creatinine, Ser 2.02 (*)    GFR, Estimated 24 (*)    All other components within normal limits  URINALYSIS, ROUTINE W REFLEX MICROSCOPIC - Abnormal; Notable for the following components:   Color, Urine STRAW (*)    Protein, ur 30 (*)    Leukocytes,Ua LARGE (*)    All other components within normal limits  TROPONIN I (HIGH SENSITIVITY) - Abnormal; Notable for the following components:   Troponin I (High Sensitivity) 40 (*)    All other components within normal limits  TROPONIN I (HIGH SENSITIVITY) - Abnormal; Notable for the following components:   Troponin I (High Sensitivity) 48 (*)    All other components within normal limits     EKG EKG Interpretation  Date/Time:  Friday August 09 2021 11:29:58 EDT Ventricular Rate:  72 PR Interval:  176 QRS Duration: 102 QT Interval:  417 QTC Calculation: 457 R Axis:   18 Text Interpretation: Sinus rhythm Probable left ventricular hypertrophy Confirmed by Thamas Jaegers ([id number]) on 08/09/2021 1:45:22 PM  Radiology No results found.  Procedures Procedures    Medications Ordered in ED Medications - No data to display  ED Course/ Medical  Decision Making/ A&P                           Medical Decision Making Amount and/or Complexity of Data Reviewed Labs: ordered.   History obtained from family bedside.  Review of outside records performed, office visit 06/09/2021 for left issue.  Cardiac monitoring showing sinus rhythm.  Labs are sent urine shows large leuks but no bacteria noted.  Chemistry unremarkable white count of 10.  Troponins appear relatively flat 40 initially and 48 secondary, small bump noted but continues to have improvement of symptoms with no chest pain no chest pressure or difficulty breathing.  Patient will be discharged home, advised outpatient follow-up with her primary care doctor or cardiologist this week.  Recommend immediate return for worsening symptoms or any additional concerns.        Final Clinical Impression(s) / ED Diagnoses Final diagnoses:  Weakness  Vomiting without nausea, unspecified vomiting type    Rx / DC Orders ED Discharge Orders          Ordered    cephALEXin (KEFLEX) 500 MG capsule  3 times daily        08/09/21 1451    ondansetron (ZOFRAN-ODT) 4 MG disintegrating tablet  Every 8 hours PRN        08/09/21 1451              Luna Fuse, MD 08/09/21 1451

## 2021-08-09 NOTE — ED Triage Notes (Signed)
Pt to ER with c/o feeling weak.  Pt also reports dizziness and nausea.  Pt's granddaughter states that she attempted to drink a glucerna this AM and then vomited that back up.

## 2021-08-22 DIAGNOSIS — R531 Weakness: Secondary | ICD-10-CM | POA: Diagnosis not present

## 2021-08-22 DIAGNOSIS — Z299 Encounter for prophylactic measures, unspecified: Secondary | ICD-10-CM | POA: Diagnosis not present

## 2021-08-22 DIAGNOSIS — I10 Essential (primary) hypertension: Secondary | ICD-10-CM | POA: Diagnosis not present

## 2021-08-22 DIAGNOSIS — N390 Urinary tract infection, site not specified: Secondary | ICD-10-CM | POA: Diagnosis not present

## 2021-09-05 DIAGNOSIS — R350 Frequency of micturition: Secondary | ICD-10-CM | POA: Diagnosis not present

## 2021-09-05 DIAGNOSIS — Z299 Encounter for prophylactic measures, unspecified: Secondary | ICD-10-CM | POA: Diagnosis not present

## 2021-09-05 DIAGNOSIS — N390 Urinary tract infection, site not specified: Secondary | ICD-10-CM | POA: Diagnosis not present

## 2021-09-05 DIAGNOSIS — I10 Essential (primary) hypertension: Secondary | ICD-10-CM | POA: Diagnosis not present

## 2021-09-06 DIAGNOSIS — H34811 Central retinal vein occlusion, right eye, with macular edema: Secondary | ICD-10-CM | POA: Diagnosis not present

## 2021-09-06 DIAGNOSIS — H43812 Vitreous degeneration, left eye: Secondary | ICD-10-CM | POA: Diagnosis not present

## 2021-09-20 DIAGNOSIS — R350 Frequency of micturition: Secondary | ICD-10-CM | POA: Diagnosis not present

## 2021-09-20 DIAGNOSIS — I10 Essential (primary) hypertension: Secondary | ICD-10-CM | POA: Diagnosis not present

## 2021-09-20 DIAGNOSIS — Z87891 Personal history of nicotine dependence: Secondary | ICD-10-CM | POA: Diagnosis not present

## 2021-09-20 DIAGNOSIS — Z299 Encounter for prophylactic measures, unspecified: Secondary | ICD-10-CM | POA: Diagnosis not present

## 2021-09-20 DIAGNOSIS — E1165 Type 2 diabetes mellitus with hyperglycemia: Secondary | ICD-10-CM | POA: Diagnosis not present

## 2021-10-02 DIAGNOSIS — I10 Essential (primary) hypertension: Secondary | ICD-10-CM | POA: Diagnosis not present

## 2021-10-02 DIAGNOSIS — M25559 Pain in unspecified hip: Secondary | ICD-10-CM | POA: Diagnosis not present

## 2021-10-02 DIAGNOSIS — Z299 Encounter for prophylactic measures, unspecified: Secondary | ICD-10-CM | POA: Diagnosis not present

## 2021-10-02 DIAGNOSIS — Z6823 Body mass index (BMI) 23.0-23.9, adult: Secondary | ICD-10-CM | POA: Diagnosis not present

## 2021-10-02 DIAGNOSIS — I509 Heart failure, unspecified: Secondary | ICD-10-CM | POA: Diagnosis not present

## 2021-10-02 DIAGNOSIS — E1165 Type 2 diabetes mellitus with hyperglycemia: Secondary | ICD-10-CM | POA: Diagnosis not present

## 2021-10-07 DIAGNOSIS — H3582 Retinal ischemia: Secondary | ICD-10-CM | POA: Diagnosis not present

## 2021-10-07 DIAGNOSIS — H35033 Hypertensive retinopathy, bilateral: Secondary | ICD-10-CM | POA: Diagnosis not present

## 2021-10-07 DIAGNOSIS — H34811 Central retinal vein occlusion, right eye, with macular edema: Secondary | ICD-10-CM | POA: Diagnosis not present

## 2021-10-07 DIAGNOSIS — H35372 Puckering of macula, left eye: Secondary | ICD-10-CM | POA: Diagnosis not present

## 2021-11-15 DIAGNOSIS — J069 Acute upper respiratory infection, unspecified: Secondary | ICD-10-CM | POA: Diagnosis not present

## 2021-11-15 DIAGNOSIS — Z299 Encounter for prophylactic measures, unspecified: Secondary | ICD-10-CM | POA: Diagnosis not present

## 2021-11-15 DIAGNOSIS — I10 Essential (primary) hypertension: Secondary | ICD-10-CM | POA: Diagnosis not present

## 2021-11-18 DIAGNOSIS — H35372 Puckering of macula, left eye: Secondary | ICD-10-CM | POA: Diagnosis not present

## 2021-11-18 DIAGNOSIS — H34811 Central retinal vein occlusion, right eye, with macular edema: Secondary | ICD-10-CM | POA: Diagnosis not present

## 2021-11-23 DIAGNOSIS — I10 Essential (primary) hypertension: Secondary | ICD-10-CM | POA: Diagnosis not present

## 2021-11-23 DIAGNOSIS — E039 Hypothyroidism, unspecified: Secondary | ICD-10-CM | POA: Diagnosis not present

## 2021-12-11 DIAGNOSIS — B353 Tinea pedis: Secondary | ICD-10-CM | POA: Diagnosis not present

## 2021-12-11 DIAGNOSIS — I10 Essential (primary) hypertension: Secondary | ICD-10-CM | POA: Diagnosis not present

## 2021-12-11 DIAGNOSIS — Z23 Encounter for immunization: Secondary | ICD-10-CM | POA: Diagnosis not present

## 2021-12-11 DIAGNOSIS — Z299 Encounter for prophylactic measures, unspecified: Secondary | ICD-10-CM | POA: Diagnosis not present

## 2021-12-11 DIAGNOSIS — G4700 Insomnia, unspecified: Secondary | ICD-10-CM | POA: Diagnosis not present

## 2022-01-01 ENCOUNTER — Ambulatory Visit (INDEPENDENT_AMBULATORY_CARE_PROVIDER_SITE_OTHER): Payer: Medicare Other | Admitting: Urology

## 2022-01-01 VITALS — BP 121/61 | HR 62

## 2022-01-01 DIAGNOSIS — C675 Malignant neoplasm of bladder neck: Secondary | ICD-10-CM

## 2022-01-01 DIAGNOSIS — Z8551 Personal history of malignant neoplasm of bladder: Secondary | ICD-10-CM | POA: Diagnosis not present

## 2022-01-01 MED ORDER — CIPROFLOXACIN HCL 500 MG PO TABS
500.0000 mg | ORAL_TABLET | Freq: Once | ORAL | Status: AC
  Administered 2022-01-01: 500 mg via ORAL

## 2022-01-01 NOTE — Patient Instructions (Signed)

## 2022-01-01 NOTE — Progress Notes (Signed)
   01/01/22  CC: bladder cancer   HPI: [name] is a 88yo here for followup for high grade bladder cancer Blood pressure 121/61, pulse 62. NED. A&Ox3.   No respiratory distress   Abd soft, NT, ND Normal external genitalia with patent urethral meatus  Cystoscopy Procedure Note  Patient identification was confirmed, informed consent was obtained, and patient was prepped using Betadine solution.  Lidocaine jelly was administered per urethral meatus.    Procedure: - Flexible cystoscope introduced, without any difficulty.   - Thorough search of the bladder revealed:    normal urethral meatus    normal urothelium    no stones    no ulcers     no tumors    no urethral polyps    no trabeculation  - Ureteral orifices were normal in position and appearance.  Post-Procedure: - Patient tolerated the procedure well  Assessment/ Plan: RTC 6 months for cystoscopy   No follow-ups on file.  Nicolette Bang, MD

## 2022-01-02 DIAGNOSIS — H35372 Puckering of macula, left eye: Secondary | ICD-10-CM | POA: Diagnosis not present

## 2022-01-02 DIAGNOSIS — H3582 Retinal ischemia: Secondary | ICD-10-CM | POA: Diagnosis not present

## 2022-01-02 DIAGNOSIS — E119 Type 2 diabetes mellitus without complications: Secondary | ICD-10-CM | POA: Diagnosis not present

## 2022-01-02 DIAGNOSIS — H35033 Hypertensive retinopathy, bilateral: Secondary | ICD-10-CM | POA: Diagnosis not present

## 2022-01-02 DIAGNOSIS — H34811 Central retinal vein occlusion, right eye, with macular edema: Secondary | ICD-10-CM | POA: Diagnosis not present

## 2022-01-02 LAB — URINALYSIS, ROUTINE W REFLEX MICROSCOPIC
Bilirubin, UA: NEGATIVE
Glucose, UA: NEGATIVE
Ketones, UA: NEGATIVE
Nitrite, UA: NEGATIVE
RBC, UA: NEGATIVE
Specific Gravity, UA: 1.015 (ref 1.005–1.030)
Urobilinogen, Ur: 0.2 mg/dL (ref 0.2–1.0)
pH, UA: 5.5 (ref 5.0–7.5)

## 2022-01-02 LAB — MICROSCOPIC EXAMINATION

## 2022-01-06 DIAGNOSIS — E1140 Type 2 diabetes mellitus with diabetic neuropathy, unspecified: Secondary | ICD-10-CM | POA: Diagnosis not present

## 2022-01-06 DIAGNOSIS — M79671 Pain in right foot: Secondary | ICD-10-CM | POA: Diagnosis not present

## 2022-01-06 DIAGNOSIS — M79674 Pain in right toe(s): Secondary | ICD-10-CM | POA: Diagnosis not present

## 2022-01-06 DIAGNOSIS — L110 Acquired keratosis follicularis: Secondary | ICD-10-CM | POA: Diagnosis not present

## 2022-01-06 DIAGNOSIS — I739 Peripheral vascular disease, unspecified: Secondary | ICD-10-CM | POA: Diagnosis not present

## 2022-01-06 DIAGNOSIS — M79672 Pain in left foot: Secondary | ICD-10-CM | POA: Diagnosis not present

## 2022-01-06 DIAGNOSIS — M79675 Pain in left toe(s): Secondary | ICD-10-CM | POA: Diagnosis not present

## 2022-01-10 DIAGNOSIS — E1165 Type 2 diabetes mellitus with hyperglycemia: Secondary | ICD-10-CM | POA: Diagnosis not present

## 2022-01-10 DIAGNOSIS — Z299 Encounter for prophylactic measures, unspecified: Secondary | ICD-10-CM | POA: Diagnosis not present

## 2022-01-10 DIAGNOSIS — I10 Essential (primary) hypertension: Secondary | ICD-10-CM | POA: Diagnosis not present

## 2022-01-10 DIAGNOSIS — Z0000 Encounter for general adult medical examination without abnormal findings: Secondary | ICD-10-CM | POA: Diagnosis not present

## 2022-01-10 DIAGNOSIS — Z789 Other specified health status: Secondary | ICD-10-CM | POA: Diagnosis not present

## 2022-01-10 DIAGNOSIS — N184 Chronic kidney disease, stage 4 (severe): Secondary | ICD-10-CM | POA: Diagnosis not present

## 2022-01-10 DIAGNOSIS — Z6824 Body mass index (BMI) 24.0-24.9, adult: Secondary | ICD-10-CM | POA: Diagnosis not present

## 2022-01-10 DIAGNOSIS — E1122 Type 2 diabetes mellitus with diabetic chronic kidney disease: Secondary | ICD-10-CM | POA: Diagnosis not present

## 2022-01-23 DIAGNOSIS — E039 Hypothyroidism, unspecified: Secondary | ICD-10-CM | POA: Diagnosis not present

## 2022-01-23 DIAGNOSIS — I10 Essential (primary) hypertension: Secondary | ICD-10-CM | POA: Diagnosis not present

## 2022-02-19 DIAGNOSIS — H34811 Central retinal vein occlusion, right eye, with macular edema: Secondary | ICD-10-CM | POA: Diagnosis not present

## 2022-02-28 DIAGNOSIS — M109 Gout, unspecified: Secondary | ICD-10-CM | POA: Diagnosis not present

## 2022-02-28 DIAGNOSIS — Z7189 Other specified counseling: Secondary | ICD-10-CM | POA: Diagnosis not present

## 2022-02-28 DIAGNOSIS — I10 Essential (primary) hypertension: Secondary | ICD-10-CM | POA: Diagnosis not present

## 2022-02-28 DIAGNOSIS — Z0000 Encounter for general adult medical examination without abnormal findings: Secondary | ICD-10-CM | POA: Diagnosis not present

## 2022-02-28 DIAGNOSIS — Z6824 Body mass index (BMI) 24.0-24.9, adult: Secondary | ICD-10-CM | POA: Diagnosis not present

## 2022-02-28 DIAGNOSIS — R5383 Other fatigue: Secondary | ICD-10-CM | POA: Diagnosis not present

## 2022-02-28 DIAGNOSIS — Z299 Encounter for prophylactic measures, unspecified: Secondary | ICD-10-CM | POA: Diagnosis not present

## 2022-02-28 DIAGNOSIS — I509 Heart failure, unspecified: Secondary | ICD-10-CM | POA: Diagnosis not present

## 2022-02-28 DIAGNOSIS — Z79899 Other long term (current) drug therapy: Secondary | ICD-10-CM | POA: Diagnosis not present

## 2022-02-28 DIAGNOSIS — E7800 Pure hypercholesterolemia, unspecified: Secondary | ICD-10-CM | POA: Diagnosis not present

## 2022-02-28 DIAGNOSIS — I739 Peripheral vascular disease, unspecified: Secondary | ICD-10-CM | POA: Diagnosis not present

## 2022-04-08 DIAGNOSIS — E1140 Type 2 diabetes mellitus with diabetic neuropathy, unspecified: Secondary | ICD-10-CM | POA: Diagnosis not present

## 2022-04-08 DIAGNOSIS — M79672 Pain in left foot: Secondary | ICD-10-CM | POA: Diagnosis not present

## 2022-04-08 DIAGNOSIS — M79675 Pain in left toe(s): Secondary | ICD-10-CM | POA: Diagnosis not present

## 2022-04-08 DIAGNOSIS — I739 Peripheral vascular disease, unspecified: Secondary | ICD-10-CM | POA: Diagnosis not present

## 2022-04-08 DIAGNOSIS — M79674 Pain in right toe(s): Secondary | ICD-10-CM | POA: Diagnosis not present

## 2022-04-08 DIAGNOSIS — L110 Acquired keratosis follicularis: Secondary | ICD-10-CM | POA: Diagnosis not present

## 2022-04-08 DIAGNOSIS — M79671 Pain in right foot: Secondary | ICD-10-CM | POA: Diagnosis not present

## 2022-04-29 DIAGNOSIS — I10 Essential (primary) hypertension: Secondary | ICD-10-CM | POA: Diagnosis not present

## 2022-04-29 DIAGNOSIS — R350 Frequency of micturition: Secondary | ICD-10-CM | POA: Diagnosis not present

## 2022-04-29 DIAGNOSIS — N390 Urinary tract infection, site not specified: Secondary | ICD-10-CM | POA: Diagnosis not present

## 2022-04-29 DIAGNOSIS — Z299 Encounter for prophylactic measures, unspecified: Secondary | ICD-10-CM | POA: Diagnosis not present

## 2022-04-29 DIAGNOSIS — I509 Heart failure, unspecified: Secondary | ICD-10-CM | POA: Diagnosis not present

## 2022-05-06 DIAGNOSIS — J302 Other seasonal allergic rhinitis: Secondary | ICD-10-CM | POA: Diagnosis not present

## 2022-05-06 DIAGNOSIS — I10 Essential (primary) hypertension: Secondary | ICD-10-CM | POA: Diagnosis not present

## 2022-05-06 DIAGNOSIS — Z299 Encounter for prophylactic measures, unspecified: Secondary | ICD-10-CM | POA: Diagnosis not present

## 2022-05-06 DIAGNOSIS — R059 Cough, unspecified: Secondary | ICD-10-CM | POA: Diagnosis not present

## 2022-05-06 DIAGNOSIS — J069 Acute upper respiratory infection, unspecified: Secondary | ICD-10-CM | POA: Diagnosis not present

## 2022-05-14 DIAGNOSIS — H43812 Vitreous degeneration, left eye: Secondary | ICD-10-CM | POA: Diagnosis not present

## 2022-05-14 DIAGNOSIS — H3582 Retinal ischemia: Secondary | ICD-10-CM | POA: Diagnosis not present

## 2022-05-14 DIAGNOSIS — H04123 Dry eye syndrome of bilateral lacrimal glands: Secondary | ICD-10-CM | POA: Diagnosis not present

## 2022-05-14 DIAGNOSIS — H35033 Hypertensive retinopathy, bilateral: Secondary | ICD-10-CM | POA: Diagnosis not present

## 2022-05-14 DIAGNOSIS — H34811 Central retinal vein occlusion, right eye, with macular edema: Secondary | ICD-10-CM | POA: Diagnosis not present

## 2022-05-14 DIAGNOSIS — E119 Type 2 diabetes mellitus without complications: Secondary | ICD-10-CM | POA: Diagnosis not present

## 2022-05-20 DIAGNOSIS — M25532 Pain in left wrist: Secondary | ICD-10-CM | POA: Diagnosis not present

## 2022-05-20 DIAGNOSIS — S63502A Unspecified sprain of left wrist, initial encounter: Secondary | ICD-10-CM | POA: Diagnosis not present

## 2022-06-26 DIAGNOSIS — H34811 Central retinal vein occlusion, right eye, with macular edema: Secondary | ICD-10-CM | POA: Diagnosis not present

## 2022-07-02 ENCOUNTER — Encounter (HOSPITAL_COMMUNITY): Payer: Self-pay

## 2022-07-02 ENCOUNTER — Other Ambulatory Visit: Payer: Medicare Other | Admitting: Urology

## 2022-07-02 ENCOUNTER — Inpatient Hospital Stay (HOSPITAL_COMMUNITY)
Admission: EM | Admit: 2022-07-02 | Discharge: 2022-07-08 | DRG: 291 | Disposition: A | Discharge status: Home Health | Payer: 59 | Attending: Internal Medicine | Admitting: Internal Medicine

## 2022-07-02 ENCOUNTER — Emergency Department (HOSPITAL_COMMUNITY): Payer: 59

## 2022-07-02 ENCOUNTER — Other Ambulatory Visit: Payer: Self-pay

## 2022-07-02 DIAGNOSIS — I2489 Other forms of acute ischemic heart disease: Secondary | ICD-10-CM | POA: Diagnosis not present

## 2022-07-02 DIAGNOSIS — Z96642 Presence of left artificial hip joint: Secondary | ICD-10-CM | POA: Diagnosis not present

## 2022-07-02 DIAGNOSIS — Z1152 Encounter for screening for COVID-19: Secondary | ICD-10-CM

## 2022-07-02 DIAGNOSIS — K219 Gastro-esophageal reflux disease without esophagitis: Secondary | ICD-10-CM | POA: Diagnosis not present

## 2022-07-02 DIAGNOSIS — N184 Chronic kidney disease, stage 4 (severe): Secondary | ICD-10-CM | POA: Diagnosis present

## 2022-07-02 DIAGNOSIS — K5900 Constipation, unspecified: Secondary | ICD-10-CM | POA: Diagnosis present

## 2022-07-02 DIAGNOSIS — Z7984 Long term (current) use of oral hypoglycemic drugs: Secondary | ICD-10-CM

## 2022-07-02 DIAGNOSIS — Z833 Family history of diabetes mellitus: Secondary | ICD-10-CM

## 2022-07-02 DIAGNOSIS — I350 Nonrheumatic aortic (valve) stenosis: Secondary | ICD-10-CM | POA: Diagnosis present

## 2022-07-02 DIAGNOSIS — E785 Hyperlipidemia, unspecified: Secondary | ICD-10-CM | POA: Diagnosis not present

## 2022-07-02 DIAGNOSIS — J9601 Acute respiratory failure with hypoxia: Secondary | ICD-10-CM | POA: Diagnosis present

## 2022-07-02 DIAGNOSIS — N179 Acute kidney failure, unspecified: Secondary | ICD-10-CM | POA: Diagnosis not present

## 2022-07-02 DIAGNOSIS — Z66 Do not resuscitate: Secondary | ICD-10-CM | POA: Diagnosis present

## 2022-07-02 DIAGNOSIS — E1122 Type 2 diabetes mellitus with diabetic chronic kidney disease: Secondary | ICD-10-CM | POA: Diagnosis present

## 2022-07-02 DIAGNOSIS — E039 Hypothyroidism, unspecified: Secondary | ICD-10-CM | POA: Diagnosis not present

## 2022-07-02 DIAGNOSIS — D631 Anemia in chronic kidney disease: Secondary | ICD-10-CM | POA: Diagnosis present

## 2022-07-02 DIAGNOSIS — Z87891 Personal history of nicotine dependence: Secondary | ICD-10-CM | POA: Diagnosis not present

## 2022-07-02 DIAGNOSIS — Z8249 Family history of ischemic heart disease and other diseases of the circulatory system: Secondary | ICD-10-CM | POA: Diagnosis not present

## 2022-07-02 DIAGNOSIS — E1165 Type 2 diabetes mellitus with hyperglycemia: Secondary | ICD-10-CM | POA: Diagnosis present

## 2022-07-02 DIAGNOSIS — E782 Mixed hyperlipidemia: Secondary | ICD-10-CM | POA: Diagnosis not present

## 2022-07-02 DIAGNOSIS — J189 Pneumonia, unspecified organism: Secondary | ICD-10-CM | POA: Diagnosis present

## 2022-07-02 DIAGNOSIS — I130 Hypertensive heart and chronic kidney disease with heart failure and stage 1 through stage 4 chronic kidney disease, or unspecified chronic kidney disease: Secondary | ICD-10-CM | POA: Diagnosis not present

## 2022-07-02 DIAGNOSIS — I493 Ventricular premature depolarization: Secondary | ICD-10-CM | POA: Diagnosis present

## 2022-07-02 DIAGNOSIS — I110 Hypertensive heart disease with heart failure: Secondary | ICD-10-CM | POA: Diagnosis not present

## 2022-07-02 DIAGNOSIS — R0602 Shortness of breath: Secondary | ICD-10-CM | POA: Diagnosis not present

## 2022-07-02 DIAGNOSIS — I5021 Acute systolic (congestive) heart failure: Secondary | ICD-10-CM | POA: Diagnosis present

## 2022-07-02 DIAGNOSIS — Z79899 Other long term (current) drug therapy: Secondary | ICD-10-CM

## 2022-07-02 DIAGNOSIS — I10 Essential (primary) hypertension: Secondary | ICD-10-CM | POA: Diagnosis present

## 2022-07-02 DIAGNOSIS — R011 Cardiac murmur, unspecified: Secondary | ICD-10-CM | POA: Diagnosis not present

## 2022-07-02 DIAGNOSIS — N183 Chronic kidney disease, stage 3 unspecified: Secondary | ICD-10-CM

## 2022-07-02 DIAGNOSIS — Z9842 Cataract extraction status, left eye: Secondary | ICD-10-CM

## 2022-07-02 DIAGNOSIS — I2510 Atherosclerotic heart disease of native coronary artery without angina pectoris: Secondary | ICD-10-CM | POA: Diagnosis present

## 2022-07-02 DIAGNOSIS — D638 Anemia in other chronic diseases classified elsewhere: Secondary | ICD-10-CM | POA: Diagnosis not present

## 2022-07-02 DIAGNOSIS — I5042 Chronic combined systolic (congestive) and diastolic (congestive) heart failure: Secondary | ICD-10-CM

## 2022-07-02 DIAGNOSIS — Z79890 Hormone replacement therapy (postmenopausal): Secondary | ICD-10-CM

## 2022-07-02 DIAGNOSIS — E119 Type 2 diabetes mellitus without complications: Secondary | ICD-10-CM

## 2022-07-02 DIAGNOSIS — E079 Disorder of thyroid, unspecified: Secondary | ICD-10-CM | POA: Insufficient documentation

## 2022-07-02 DIAGNOSIS — Z9841 Cataract extraction status, right eye: Secondary | ICD-10-CM

## 2022-07-02 DIAGNOSIS — R9431 Abnormal electrocardiogram [ECG] [EKG]: Secondary | ICD-10-CM | POA: Insufficient documentation

## 2022-07-02 DIAGNOSIS — I5043 Acute on chronic combined systolic (congestive) and diastolic (congestive) heart failure: Secondary | ICD-10-CM

## 2022-07-02 DIAGNOSIS — R0603 Acute respiratory distress: Secondary | ICD-10-CM | POA: Diagnosis not present

## 2022-07-02 DIAGNOSIS — C675 Malignant neoplasm of bladder neck: Secondary | ICD-10-CM

## 2022-07-02 DIAGNOSIS — Z299 Encounter for prophylactic measures, unspecified: Secondary | ICD-10-CM | POA: Diagnosis not present

## 2022-07-02 DIAGNOSIS — J90 Pleural effusion, not elsewhere classified: Secondary | ICD-10-CM | POA: Diagnosis not present

## 2022-07-02 DIAGNOSIS — R778 Other specified abnormalities of plasma proteins: Secondary | ICD-10-CM | POA: Diagnosis not present

## 2022-07-02 DIAGNOSIS — Z885 Allergy status to narcotic agent status: Secondary | ICD-10-CM

## 2022-07-02 DIAGNOSIS — Z961 Presence of intraocular lens: Secondary | ICD-10-CM | POA: Diagnosis present

## 2022-07-02 DIAGNOSIS — Z515 Encounter for palliative care: Secondary | ICD-10-CM

## 2022-07-02 DIAGNOSIS — Z7982 Long term (current) use of aspirin: Secondary | ICD-10-CM

## 2022-07-02 DIAGNOSIS — Z8551 Personal history of malignant neoplasm of bladder: Secondary | ICD-10-CM

## 2022-07-02 DIAGNOSIS — R7989 Other specified abnormal findings of blood chemistry: Secondary | ICD-10-CM | POA: Insufficient documentation

## 2022-07-02 DIAGNOSIS — I509 Heart failure, unspecified: Secondary | ICD-10-CM

## 2022-07-02 LAB — PROCALCITONIN: Procalcitonin: 0.32 ng/mL

## 2022-07-02 LAB — GLUCOSE, CAPILLARY: Glucose-Capillary: 184 mg/dL — ABNORMAL HIGH (ref 70–99)

## 2022-07-02 LAB — COMPREHENSIVE METABOLIC PANEL
ALT: 20 U/L (ref 0–44)
AST: 27 U/L (ref 15–41)
Albumin: 2.7 g/dL — ABNORMAL LOW (ref 3.5–5.0)
Alkaline Phosphatase: 130 U/L — ABNORMAL HIGH (ref 38–126)
Anion gap: 10 (ref 5–15)
BUN: 54 mg/dL — ABNORMAL HIGH (ref 8–23)
CO2: 22 mmol/L (ref 22–32)
Calcium: 8.9 mg/dL (ref 8.9–10.3)
Chloride: 102 mmol/L (ref 98–111)
Creatinine, Ser: 2.37 mg/dL — ABNORMAL HIGH (ref 0.44–1.00)
GFR, Estimated: 19 mL/min — ABNORMAL LOW (ref >60)
Glucose, Bld: 211 mg/dL — ABNORMAL HIGH (ref 70–99)
Potassium: 3.7 mmol/L (ref 3.5–5.1)
Sodium: 134 mmol/L — ABNORMAL LOW (ref 135–145)
Total Bilirubin: 0.5 mg/dL (ref 0.3–1.2)
Total Protein: 7 g/dL (ref 6.5–8.1)

## 2022-07-02 LAB — TROPONIN I (HIGH SENSITIVITY)
Troponin I (High Sensitivity): 202 ng/L (ref <18)
Troponin I (High Sensitivity): 198 ng/L (ref <18)

## 2022-07-02 LAB — BASIC METABOLIC PANEL
Anion gap: 11 (ref 5–15)
BUN: 55 mg/dL — ABNORMAL HIGH (ref 8–23)
CO2: 22 mmol/L (ref 22–32)
Calcium: 9.1 mg/dL (ref 8.9–10.3)
Chloride: 103 mmol/L (ref 98–111)
Creatinine, Ser: 2.4 mg/dL — ABNORMAL HIGH (ref 0.44–1.00)
GFR, Estimated: 19 mL/min — ABNORMAL LOW (ref >60)
Glucose, Bld: 183 mg/dL — ABNORMAL HIGH (ref 70–99)
Potassium: 3.7 mmol/L (ref 3.5–5.1)
Sodium: 136 mmol/L (ref 135–145)

## 2022-07-02 LAB — CBC WITH DIFFERENTIAL/PLATELET
Abs Immature Granulocytes: 0.03 10*3/uL (ref 0.00–0.07)
Basophils Absolute: 0.1 10*3/uL (ref 0.0–0.1)
Basophils Relative: 1 %
Eosinophils Absolute: 0.1 10*3/uL (ref 0.0–0.5)
Eosinophils Relative: 2 %
HCT: 27.2 % — ABNORMAL LOW (ref 36.0–46.0)
Hemoglobin: 8.4 g/dL — ABNORMAL LOW (ref 12.0–15.0)
Immature Granulocytes: 0 %
Lymphocytes Relative: 19 %
Lymphs Abs: 1.4 10*3/uL (ref 0.7–4.0)
MCH: 26.6 pg (ref 26.0–34.0)
MCHC: 30.9 g/dL (ref 30.0–36.0)
MCV: 86.1 fL (ref 80.0–100.0)
Monocytes Absolute: 0.8 10*3/uL (ref 0.1–1.0)
Monocytes Relative: 10 %
Neutro Abs: 5.1 10*3/uL (ref 1.7–7.7)
Neutrophils Relative %: 68 %
Platelets: 195 10*3/uL (ref 150–400)
RBC: 3.16 MIL/uL — ABNORMAL LOW (ref 3.87–5.11)
RDW: 16.4 % — ABNORMAL HIGH (ref 11.5–15.5)
WBC: 7.5 10*3/uL (ref 4.0–10.5)
nRBC: 0 % (ref 0.0–0.2)

## 2022-07-02 LAB — BRAIN NATRIURETIC PEPTIDE: B Natriuretic Peptide: 1307 pg/mL — ABNORMAL HIGH (ref 0.0–100.0)

## 2022-07-02 LAB — SARS CORONAVIRUS 2 BY RT PCR: SARS Coronavirus 2 by RT PCR: NEGATIVE

## 2022-07-02 MED ORDER — SODIUM CHLORIDE 0.9 % IV SOLN
500.0000 mg | INTRAVENOUS | Status: DC
  Administered 2022-07-03: 500 mg via INTRAVENOUS
  Filled 2022-07-02: qty 5

## 2022-07-02 MED ORDER — ACETAMINOPHEN 650 MG RE SUPP: 650.0000 mg | Freq: Four times a day (QID) | RECTAL | Status: DC | PRN

## 2022-07-02 MED ORDER — ONDANSETRON HCL 4 MG PO TABS: 4.0000 mg | ORAL_TABLET | Freq: Four times a day (QID) | ORAL | Status: DC | PRN

## 2022-07-02 MED ORDER — OXYCODONE HCL 5 MG PO TABS: 5.0000 mg | ORAL_TABLET | ORAL | Status: DC | PRN

## 2022-07-02 MED ORDER — IPRATROPIUM-ALBUTEROL 0.5-2.5 (3) MG/3ML IN SOLN
3.0000 mL | Freq: Once | RESPIRATORY_TRACT | Status: AC
  Administered 2022-07-02: 3 mL via RESPIRATORY_TRACT
  Filled 2022-07-02: qty 3

## 2022-07-02 MED ORDER — TRAZODONE HCL 50 MG PO TABS
50.0000 mg | ORAL_TABLET | Freq: Every day | ORAL | Status: DC
  Administered 2022-07-02 – 2022-07-07 (×5): 50 mg via ORAL
  Filled 2022-07-02 (×6): qty 1

## 2022-07-02 MED ORDER — FUROSEMIDE 10 MG/ML IJ SOLN
40.0000 mg | Freq: Two times a day (BID) | INTRAMUSCULAR | Status: DC
  Administered 2022-07-03: 40 mg via INTRAVENOUS
  Filled 2022-07-02: qty 4

## 2022-07-02 MED ORDER — LEVOTHYROXINE SODIUM 25 MCG PO TABS
25.0000 ug | ORAL_TABLET | Freq: Every day | ORAL | Status: DC
  Administered 2022-07-03 – 2022-07-04 (×2): 25 ug via ORAL
  Filled 2022-07-02 (×2): qty 1

## 2022-07-02 MED ORDER — ASPIRIN 81 MG PO TBEC
81.0000 mg | DELAYED_RELEASE_TABLET | Freq: Every evening | ORAL | Status: DC
  Administered 2022-07-02 – 2022-07-07 (×6): 81 mg via ORAL
  Filled 2022-07-02 (×6): qty 1

## 2022-07-02 MED ORDER — GUAIFENESIN-DM 100-10 MG/5ML PO SYRP
5.0000 mL | ORAL_SOLUTION | ORAL | Status: DC | PRN
  Administered 2022-07-02 – 2022-07-08 (×10): 5 mL via ORAL
  Filled 2022-07-02 (×11): qty 5

## 2022-07-02 MED ORDER — HEPARIN SODIUM (PORCINE) 5000 UNIT/ML IJ SOLN
5000.0000 [IU] | Freq: Three times a day (TID) | INTRAMUSCULAR | Status: DC
  Administered 2022-07-02 – 2022-07-08 (×17): 5000 [IU] via SUBCUTANEOUS
  Filled 2022-07-02 (×17): qty 1

## 2022-07-02 MED ORDER — METOPROLOL SUCCINATE ER 25 MG PO TB24
25.0000 mg | ORAL_TABLET | Freq: Every morning | ORAL | Status: DC
  Administered 2022-07-03: 25 mg via ORAL
  Filled 2022-07-02: qty 1

## 2022-07-02 MED ORDER — INSULIN ASPART 100 UNIT/ML IJ SOLN
0.0000 [IU] | Freq: Three times a day (TID) | INTRAMUSCULAR | Status: DC
  Administered 2022-07-03: 3 [IU] via SUBCUTANEOUS
  Administered 2022-07-03: 2 [IU] via SUBCUTANEOUS
  Administered 2022-07-04: 3 [IU] via SUBCUTANEOUS

## 2022-07-02 MED ORDER — AMLODIPINE BESYLATE 10 MG PO TABS
10.0000 mg | ORAL_TABLET | Freq: Every morning | ORAL | Status: DC
  Administered 2022-07-03: 10 mg via ORAL
  Filled 2022-07-02: qty 1

## 2022-07-02 MED ORDER — CEFTRIAXONE SODIUM 1 G IJ SOLR
1.0000 g | Freq: Once | INTRAMUSCULAR | Status: AC
  Administered 2022-07-02: 1 g via INTRAVENOUS
  Filled 2022-07-02: qty 10

## 2022-07-02 MED ORDER — MELATONIN 3 MG PO TABS
6.0000 mg | ORAL_TABLET | Freq: Every evening | ORAL | Status: DC
  Administered 2022-07-02 – 2022-07-07 (×6): 6 mg via ORAL
  Filled 2022-07-02 (×6): qty 2

## 2022-07-02 MED ORDER — INSULIN ASPART 100 UNIT/ML IJ SOLN: 0.0000 [IU] | Freq: Every day | INTRAMUSCULAR | Status: DC

## 2022-07-02 MED ORDER — POTASSIUM CHLORIDE CRYS ER 20 MEQ PO TBCR
40.0000 meq | EXTENDED_RELEASE_TABLET | Freq: Once | ORAL | Status: AC
  Administered 2022-07-02: 40 meq via ORAL
  Filled 2022-07-02: qty 2

## 2022-07-02 MED ORDER — FAMOTIDINE 20 MG PO TABS
20.0000 mg | ORAL_TABLET | Freq: Every day | ORAL | Status: DC
  Administered 2022-07-02 – 2022-07-07 (×6): 20 mg via ORAL
  Filled 2022-07-02 (×6): qty 1

## 2022-07-02 MED ORDER — SODIUM CHLORIDE 0.9 % IV SOLN
1.0000 g | INTRAVENOUS | Status: DC
  Administered 2022-07-03: 1 g via INTRAVENOUS
  Filled 2022-07-02: qty 10

## 2022-07-02 MED ORDER — FUROSEMIDE 10 MG/ML IJ SOLN
60.0000 mg | Freq: Once | INTRAMUSCULAR | Status: AC
Start: 2022-07-02 — End: 2022-07-02
  Administered 2022-07-02: 60 mg via INTRAVENOUS
  Filled 2022-07-02: qty 6

## 2022-07-02 MED ORDER — ONDANSETRON HCL 4 MG/2ML IJ SOLN: 4.0000 mg | Freq: Four times a day (QID) | INTRAMUSCULAR | Status: DC | PRN

## 2022-07-02 MED ORDER — SIMVASTATIN 20 MG PO TABS
20.0000 mg | ORAL_TABLET | Freq: Every day | ORAL | Status: DC
  Administered 2022-07-02 – 2022-07-07 (×6): 20 mg via ORAL
  Filled 2022-07-02 (×6): qty 1

## 2022-07-02 MED ORDER — ACETAMINOPHEN 325 MG PO TABS
650.0000 mg | ORAL_TABLET | Freq: Four times a day (QID) | ORAL | Status: DC | PRN
  Administered 2022-07-05: 650 mg via ORAL
  Filled 2022-07-02: qty 2

## 2022-07-02 MED ORDER — SODIUM CHLORIDE 0.9 % IV SOLN
500.0000 mg | Freq: Once | INTRAVENOUS | Status: AC
  Administered 2022-07-02: 500 mg via INTRAVENOUS
  Filled 2022-07-02: qty 5

## 2022-07-02 MED ORDER — POLYETHYLENE GLYCOL 3350 17 G PO PACK
17.0000 g | PACK | Freq: Every day | ORAL | Status: DC | PRN
  Administered 2022-07-02 – 2022-07-04 (×2): 17 g via ORAL
  Filled 2022-07-02 (×2): qty 1

## 2022-07-02 NOTE — Assessment & Plan Note (Signed)
Continue Pepcid  

## 2022-07-02 NOTE — Assessment & Plan Note (Signed)
-   Avoid QT prolonging agents when possible - Monitor on telemetry - Monitor and correct electrolytes as indicated

## 2022-07-02 NOTE — Assessment & Plan Note (Signed)
Continue statin. 

## 2022-07-02 NOTE — Assessment & Plan Note (Signed)
-   Known, chronic - Will be evaluated on updated echo

## 2022-07-02 NOTE — Assessment & Plan Note (Signed)
-   Continue Synthroid - Check TSH in the setting of acute CHF exacerbation

## 2022-07-02 NOTE — Assessment & Plan Note (Signed)
-   With dyspnea on exertion, peripheral edema, cardiomegaly, edema on chest x-ray, pulmonary effusions, and orthopnea - BNP 1307 - Elevated troponin - Takes Lasix 20 mg twice daily at home - Lasix 40 mg IV given in the ER - Continue Lasix 40 mg IV twice daily - Reds clip - Monitor intake and output - Fluid restrictions - Heart healthy diet - Daily weights - Echo in the a.m., last echo was almost a decade ago - Cardiology consulted and recommended admission to [facility] per the EDP - Associated hypoxia requiring 2 L nasal cannula - Continue to monitor on telemetry

## 2022-07-02 NOTE — ED Notes (Signed)
Pt placed on 2L [place] 02 

## 2022-07-02 NOTE — ED Provider Notes (Incomplete)
[place] EMERGENCY DEPARTMENT AT [facility] Provider Note   CSN: [id number] Arrival date & time: 07/02/22  1454     History {Add pertinent medical, surgical, social history, OB history to HPI:1} Chief Complaint  Patient presents with   Shortness of Breath    Marie Hunt is a 89 y.o. female.   Shortness of Breath   89 year old female presents emergency department complaints of shortness of breath, cough, chest pressure. Patient states symptoms been present since this past Sunday. Reports coughing up pink frothy sputum. Denies fever at home. Reports being seen by primary care earlier today and found to be hypoxic on room air in mid 80s. Patient on no oxygen at baseline. Denies abdominal pain, nausea, vomiting, urinary symptoms, change in bowel habits.  Past medical history significant for CHF, CAD, bladder cancer, diabetes mellitus type 2, hyperlipidemia, GERD, hypertension  Home Medications Prior to Admission medications   Medication Sig Start Date End Date Taking? Authorizing Provider  allopurinol (ZYLOPRIM) 100 MG tablet Take 100 mg by mouth every morning.     [provider]  amLODipine (NORVASC) 5 MG tablet Take 10 mg by mouth every morning.    [provider]  Apoaequorin (PREVAGEN PO) Take by mouth.    [provider]  aspirin EC 81 MG tablet Take 81 mg by mouth every evening.     [provider]  Biotin 1000 MCG tablet Take 1,000 mcg by mouth every evening.     [provider]  calcium-vitamin D (OSCAL WITH D) 500-200 MG-UNIT tablet Take 1 tablet by mouth. Patient not taking: Reported on 07/01/2021    [provider]  diclofenac Sodium (VOLTAREN) 1 % GEL Apply 2 g topically daily as needed (for pain). 03/25/19   [provider]  diphenhydrAMINE (BENADRYL) 2 % cream Apply 1 application topically 3 (three) times daily as needed for itching (rash). Patient not taking: Reported on 07/01/2021     [provider]  famotidine (PEPCID) 20 MG tablet Take 20 mg by mouth at bedtime.     [provider]  folic acid (FOLVITE) 400 MCG tablet Take 400 mcg by mouth every morning.     [provider]  furosemide (LASIX) 40 MG tablet Take 1 tablet (40 mg total) by mouth daily as needed for fluid. Patient taking differently: Take 20 mg by mouth 2 (two) times daily. 10/07/14   Erick Blinks, MD  glimepiride (AMARYL) 2 MG tablet Take 2 mg by mouth daily before breakfast.    [provider]  Global Inject Ease Lancets 30G MISC See admin instructions. 06/23/19   [provider]  Glycerin-Hypromellose-PEG 400 (DRY EYE RELIEF DROPS) 0.2-0.2-1 % SOLN Apply 1 drop to eye daily as needed (dry eyes).    [provider]  levothyroxine (SYNTHROID, LEVOTHROID) 25 MCG tablet Take 25 mcg by mouth daily before breakfast.    [provider]  metoprolol succinate (TOPROL-XL) 25 MG 24 hr tablet Take 25 mg by mouth every morning.    [provider]  ondansetron (ZOFRAN-ODT) 4 MG disintegrating tablet Take 1 tablet (4 mg total) by mouth every 8 (eight) hours as needed for nausea or vomiting. 08/09/21   Hong, Eustace Moore, MD  [facility] VERIO test strip 1 each daily. 06/23/19   [provider]  PAZEO 0.7 % SOLN Place 1 drop into both eyes daily. 03/27/16   [provider]  predniSONE (DELTASONE) 5 MG tablet Take 5-30 mg by mouth See admin instructions.  Take as directed per package instructions starting after surgical procedure Patient not taking: Reported on 07/01/2021    [provider]  simvastatin (ZOCOR) 20 MG tablet Take 20 mg by mouth at bedtime.    [provider]  traZODone (DESYREL) 50 MG tablet Take 50 mg by mouth at bedtime. 12/11/21   [provider]      Allergies    Codeine    Review of Systems   Review of Systems  Respiratory:  Positive for shortness of breath.   All other systems reviewed and are  negative.   Physical Exam Updated Vital Signs BP 125/62 (BP Location: Right Arm)   Pulse 76   Temp 99.2 F (37.3 C) (Oral)   Resp (!) 22   SpO2 91%  Physical Exam Vitals and nursing note reviewed.  Constitutional:      General: She is not in acute distress.    Appearance: She is well-developed.  HENT:     Head: Normocephalic and atraumatic.  Eyes:     Conjunctiva/sclera: Conjunctivae normal.  Cardiovascular:     Rate and Rhythm: Normal rate and regular rhythm.     Pulses: Normal pulses.     Heart sounds: Murmur heard.  Pulmonary:     Effort: Pulmonary effort is normal. No respiratory distress.     Breath sounds: Rales present.     Comments: Diffuse rales auscultated bilateral lung fields Abdominal:     Palpations: Abdomen is soft.     Tenderness: There is no abdominal tenderness.  Musculoskeletal:        General: No swelling.     Cervical back: Neck supple.     Right lower leg: Edema present.     Left lower leg: Edema present.     Comments: Patient with 2+ pitting edema right lower extremity with 3+ pitting edema left lower extremity.  Skin:    General: Skin is warm and dry.     Capillary Refill: Capillary refill takes less than 2 seconds.  Neurological:     Mental Status: She is alert.  Psychiatric:        Mood and Affect: Mood normal.     ED Results / Procedures / Treatments   Labs (all labs ordered are listed, but only abnormal results are displayed) Labs Reviewed  CBC WITH DIFFERENTIAL/PLATELET - Abnormal; Notable for the following components:      Result Value   RBC 3.16 (*)    Hemoglobin 8.4 (*)    HCT 27.2 (*)    RDW 16.4 (*)    All other components within normal limits  COMPREHENSIVE METABOLIC PANEL - Abnormal; Notable for the following components:   Sodium 134 (*)    Glucose, Bld 211 (*)    BUN 54 (*)    Creatinine, Ser 2.37 (*)    Albumin 2.7 (*)    Alkaline Phosphatase 130 (*)    GFR, Estimated 19 (*)    All other components within normal  limits  SARS CORONAVIRUS 2 BY RT PCR  BRAIN NATRIURETIC PEPTIDE  TROPONIN I (HIGH SENSITIVITY)    EKG None  Radiology DG Chest 2 View  Result Date: 07/02/2022 CLINICAL DATA:  Shortness of breath EXAM: CHEST - 2 VIEW COMPARISON:  X-ray 10/06/2014 FINDINGS: Hyperinflation. Diffuse interstitial changes. Tiny pleural effusions. Kyphotic x-ray obscures the apices. There is subtle opacity in the right upper lobe. Focal pneumonia or infiltrates possible. Recommend follow-up to confirm clearance. Enlarged heart with calcified aorta. Osteopenia IMPRESSION: Subtle opacity right upper  lobe.  Possible acute infiltrate. Enlarged heart with interstitial changes. Question component edema. Tiny effusions Kyphotic x-ray obscures the apices Electronically Signed   By: Karen Kays M.D.   On: 07/02/2022 15:42    Procedures Procedures  {Document cardiac monitor, telemetry assessment procedure when appropriate:1}  Medications Ordered in ED Medications  cefTRIAXone (ROCEPHIN) 1 g in sodium chloride 0.9 % 100 mL IVPB (has no administration in time range)  azithromycin (ZITHROMAX) 500 mg in sodium chloride 0.9 % 250 mL IVPB (has no administration in time range)  furosemide (LASIX) injection 60 mg (has no administration in time range)  potassium chloride SA (KLOR-CON M) CR tablet 40 mEq (has no administration in time range)    ED Course/ Medical Decision Making/ A&P Clinical Course as of 07/02/22 1927  Wed Jul 02, 2022  1740 Patient with oxygen saturations around 87% on room air when nasal cannula removed. [CR]  1827 Consulted cardiology  Dr. Eldridge Dace regarding the patient who recommended admission to [facility] for further assessment. [CR]  1923 Consulted hospitalist Dr. Corinna Gab regarding the patient who agreed with admission and assume further treatment/care. [CR]    Clinical Course User Index [CR] Peter Garter, PA   {   Click here for ABCD2, HEART and other calculatorsREFRESH Note before signing :1}                           Medical Decision Making Amount and/or Complexity of Data Reviewed Labs: ordered. Radiology: ordered.  Risk Prescription drug management.   This patient presents to the ED for concern of shortness of breath/cough, this involves an extensive number of treatment options, and is a complaint that carries with it a high risk of complications and morbidity.  The differential diagnosis includes The causes for shortness of breath include but are not limited to Cardiac (AHF, pericardial effusion and tamponade, arrhythmias, ischemia, etc) Respiratory (COPD, asthma, pneumonia, pneumothorax, primary pulmonary hypertension, PE/VQ mismatch) Hematological (anemia)  Co morbidities that complicate the patient evaluation  See HPI   Additional history obtained:  Additional history obtained from EMR External records from outside source obtained and reviewed including hospital records   Lab Tests:  I Ordered, and personally interpreted labs.  The pertinent results include:  ***   Imaging Studies ordered:  I ordered imaging studies including ***  I independently visualized and interpreted imaging which showed *** I agree with the radiologist interpretation   Cardiac Monitoring: / EKG:  The patient was maintained on a cardiac monitor.  I personally viewed and interpreted the cardiac monitored which showed an underlying rhythm of: ***   Consultations Obtained:  I requested consultation with the ***,  and discussed lab and imaging findings as well as pertinent plan - they recommend: ***   Problem List / ED Course / Critical interventions / Medication management  *** I ordered medication including ***  for ***  Reevaluation of the patient after these medicines showed that the patient {resolved/improved/worsened:23923::"improved"} I have reviewed the patients home medicines and have made adjustments as needed   Social Determinants of Health:  ***   Test / Admission  - Considered:  Vitals signs significant for ***. Otherwise within normal range and stable throughout visit. Laboratory/imaging studies significant for: *** *** Treatment plan were discussed at length with patient and they knowledge understanding was agreeable to said plan.  Appropriate consultations were made as described in the ED course.  Patient was stable upon admission to the hospital.   {  Document critical care time when appropriate:1} {Document review of labs and clinical decision tools ie heart score, Chads2Vasc2 etc:1}  {Document your independent review of radiology images, and any outside records:1} {Document your discussion with family members, caretakers, and with consultants:1} {Document social determinants of health affecting pt's care:1} {Document your decision making why or why not admission, treatments were needed:1} Final Clinical Impression(s) / ED Diagnoses Final diagnoses:  None    Rx / DC Orders ED Discharge Orders     None

## 2022-07-02 NOTE — ED Notes (Signed)
Date and time results received: 07/02/22 1751 (use smartphrase ".now" to insert current time)  Test: troponin  Critical Value: 202  Name of Provider Notified: Dr Particia Nearing  Orders Received? Or Actions Taken?:

## 2022-07-02 NOTE — ED Provider Triage Note (Signed)
Emergency Medicine Provider Triage Evaluation Note  Marie Hunt , a 89 y.o. female  was evaluated in triage.  Pt complains of shortness of breath, cough, chest pressure.  Patient states symptoms been present since this past Sunday.  Reports coughing up pink frothy sputum.  Denies fever at home.  Reports being seen by primary care earlier today and found to be hypoxic on room air in mid 80s.  Patient on no oxygen at baseline.  Denies abdominal pain, nausea, vomiting.  Review of Systems  Positive: See above Negative:   Physical Exam  BP 125/62 (BP Location: Right Arm)   Pulse 76   Temp 99.2 F (37.3 C) (Oral)   Resp (!) 22   SpO2 91%  Gen:   Awake, no distress   Resp:  Normal effort  MSK:   Moves extremities without difficulty  Other:    Medical Decision Making  Medically screening exam initiated at 5:15 PM.  Appropriate orders placed.  Marie Hunt was informed that the remainder of the evaluation will be completed by another provider, this initial triage assessment does not replace that evaluation, and the importance of remaining in the ED until their evaluation is complete.     Peter Garter, [place] 07/02/22 1715

## 2022-07-02 NOTE — Assessment & Plan Note (Signed)
-   Holding glimepiride - Sliding scale coverage - Hemoglobin A1c

## 2022-07-02 NOTE — ED Triage Notes (Signed)
Pt c/o sob x 3 days. Denies pian. C/o cough with pink mucus. Pt is a/o to most. Could not tell the date/year. . Color wnl. Pt with granddaughter. Ble swelling present, chronic. Sent from [facility] today due to low sat

## 2022-07-02 NOTE — Assessment & Plan Note (Signed)
Continue amlodipine and metoprolol 

## 2022-07-02 NOTE — H&P (Signed)
History and Physical    Patient: Marie Hunt UJW:[id number] DOB: 1932/10/01 DOA: 07/02/2022 DOS: the patient was seen and examined on 07/02/2022 PCP: Marie Peri, MD  Patient coming from: Home  Chief Complaint:  Chief Complaint  Patient presents with   Shortness of Breath   HPI: Marie Hunt is a 89 y.o. female with medical history significant of CAD, hypertension, GERD, type 2 diabetes mellitus, thyroid disease, hyperlipidemia, and more presents the ED with a chief complaint of dyspnea.  Patient has had dyspnea since 3 days ago.  It is worse on exertion.  She has had orthopnea.  Patient is not able to provide much of her history due to memory deficits.  Granddaughter at bedside reminds me that she has had orthopnea, but is not sure how many pills she has been propped up on.  Patient had peripheral edema.  Her left lower extremity is always swollen, but it is more swollen than normal.  Her right lower extremity is also swollen at this time.  Patient reports decreased urine output.  She is been taking her Lasix as prescribed.  She has had central chest pressure this a.m.  She does not remember what brought it on or what made it go away.  Patient does report that this morning she felt just terrible.  She could not get up out of bed.  This is abnormal for her.  Patient has no other complaints at this time.  On review of systems patient admits to constipation.  The rest of the review of systems is very difficult to know the accuracy of given her memory deficits.  Patient does not smoke.  She does not drink.  She did get her COVID-vaccine.  Patient reports that she would want to be full code. Review of Systems: As mentioned in the history of present illness. All other systems reviewed and are negative. Past Medical History:  Diagnosis Date   Anemia    Carotid artery disease (HCC)    1-39% RICA, patent LICA 7/12 - Dr. Edilia Bo   Cholelithiasis    Cholelithiasis    In need of  cholecystectomy   Diabetes mellitus    Type II   Essential hypertension, benign    GERD (gastroesophageal reflux disease)    GERD (gastroesophageal reflux disease)    Mixed hyperlipidemia    Sigmoid diverticulosis    Sigmoid diverticulosis    Type 2 diabetes mellitus (HCC)    Past Surgical History:  Procedure Laterality Date   CAROTID ENDARTERECTOMY  08/24/2009   Left catroid endarterectomy   CATARACT EXTRACTION W/PHACO Left 08/17/2014   Procedure: CATARACT EXTRACTION PHACO AND INTRAOCULAR LENS PLACEMENT; CDE:  7.39;  Surgeon: Gemma Payor, MD;  Location: [facility];  Service: Ophthalmology;  Laterality: Left;   CATARACT EXTRACTION W/PHACO Right 10/02/2014   Procedure: CATARACT EXTRACTION PHACO AND INTRAOCULAR LENS PLACEMENT RIGHT EYE CDE=5.83;  Surgeon: Gemma Payor, MD;  Location: [facility];  Service: Ophthalmology;  Laterality: Right;   CYSTOSCOPY W/ RETROGRADES Bilateral 03/28/2019   Procedure: CYSTOSCOPY WITH BILATRAL RETROGRADE PYELOGRAM,;  Surgeon: Malen Gauze, MD;  Location: [facility];  Service: Urology;  Laterality: Bilateral;   EYE SURGERY     HIP ARTHROPLASTY Left 10/03/2014   Procedure: ARTHROPLASTY BIPOLAR HIP (HEMIARTHROPLASTY);  Surgeon: Darreld Mclean, MD;  Location: [facility];  Service: Orthopedics;  Laterality: Left;   Left carotid endarterectomy  7/11   Dr. Edilia Bo   TRANSURETHRAL RESECTION OF BLADDER TUMOR N/A 03/28/2019   Procedure: TRANSURETHRAL RESECTION OF BLADDER TUMOR (  TURBT);  Surgeon: Malen Gauze, MD;  Location: [facility];  Service: Urology;  Laterality: N/A;   URETEROSCOPY Left 03/28/2019   Procedure: DIAGNOSTIC URETEROSCOPY WITH LEFT URETERAL STENT PLACEMENT;  Surgeon: Malen Gauze, MD;  Location: [facility];  Service: Urology;  Laterality: Left;   Social History:  reports that she quit smoking about 10 years ago. Her smoking use included cigarettes. She has a 18.00 pack-year smoking history. She has never used smokeless tobacco. She reports that she does not drink  alcohol and does not use drugs.  Allergies  Allergen Reactions   Codeine Nausea And Vomiting    Family History  Problem Relation Age of Onset   Diabetes Mother    Diabetes Father    Heart attack Daughter    Other Daughter        Bleeding problems   Cancer Daughter    Coronary artery disease Other    Heart disease Sister    Diabetes Sister    Hyperlipidemia Sister    Hypertension Sister    Heart disease Sister    Heart disease Sister     Prior to Admission medications   Medication Sig Start Date End Date Taking? Authorizing Provider  allopurinol (ZYLOPRIM) 100 MG tablet Take 100 mg by mouth every morning.    Yes [provider]  amLODipine (NORVASC) 5 MG tablet Take 10 mg by mouth every morning.   Yes [provider]  Apoaequorin (PREVAGEN PO) Take 1 tablet by mouth every evening.   Yes [provider]  aspirin EC 81 MG tablet Take 81 mg by mouth every evening.    Yes [provider]  diclofenac Sodium (VOLTAREN) 1 % GEL Apply 2 g topically daily as needed (for pain). 03/25/19  Yes [provider]  diphenhydrAMINE (BENADRYL) 2 % cream Apply 1 application  topically 3 (three) times daily as needed for itching (rash).   Yes [provider]  famotidine (PEPCID) 20 MG tablet Take 20 mg by mouth at bedtime.    Yes [provider]  folic acid (FOLVITE) 400 MCG tablet Take 400 mcg by mouth every morning.    Yes [provider]  furosemide (LASIX) 40 MG tablet Take 1 tablet (40 mg total) by mouth daily as needed for fluid. Patient taking differently: Take 20 mg by mouth 2 (two) times daily. 10/07/14  Yes Memon, Durward Mallard, MD  glimepiride (AMARYL) 2 MG tablet Take 2 mg by mouth daily before breakfast.   Yes [provider]  Glycerin-Hypromellose-PEG 400 (DRY EYE RELIEF DROPS) 0.2-0.2-1 % SOLN Apply 1 drop to eye daily as needed (dry eyes).   Yes [provider]  levothyroxine (SYNTHROID, LEVOTHROID) 25  MCG tablet Take 25 mcg by mouth daily before breakfast.   Yes [provider]  Melatonin 10 MG TABS Take 1 tablet by mouth every evening.   Yes [provider]  metoprolol succinate (TOPROL-XL) 25 MG 24 hr tablet Take 25 mg by mouth every morning.   Yes [provider]  simvastatin (ZOCOR) 20 MG tablet Take 20 mg by mouth at bedtime.   Yes [provider]  traZODone (DESYREL) 50 MG tablet Take 50 mg by mouth at bedtime. 12/11/21  Yes [provider]  Global Inject Ease Lancets 30G MISC See admin instructions. 06/23/19   [provider]  [facility] VERIO test strip 1 each daily. 06/23/19   [provider]    Physical Exam: Vitals:   07/02/22 1500 07/02/22 1930 07/02/22 2030  BP: 125/62 137/61 133/73  Pulse: 76 99   Resp: (!) 22 18 16   Temp: 99.2 F (37.3 C)    TempSrc: Oral    SpO2: 91% 96%    1.  General: Patient lying supine in bed,  no acute distress   2. Psychiatric: Alert and oriented, mood and behavior normal for situation, pleasant and cooperative with exam   3. Neurologic: Speech and language are normal, face is symmetric, moves all 4 extremities voluntarily, at baseline without acute deficits on limited exam   4. HEENMT:  Head is atraumatic, normocephalic, pupils reactive to light, neck is supple, trachea is midline, mucous membranes are moist   5. Respiratory : Lungs are clear to auscultation bilaterally without wheezing, rhonchi, rales, no cyanosis, no increase in work of breathing or accessory muscle use   6. Cardiovascular : Heart rate normal, rhythm is regular, heart murmur present, rubs or gallops, positive for peripheral edema, peripheral pulses palpated   7. Gastrointestinal:  Abdomen is soft, nondistended, nontender to palpation bowel sounds active, no masses or organomegaly palpated   8. Skin:  Skin is warm, dry and intact without rashes, acute lesions, or ulcers on limited exam    9.Musculoskeletal:  No acute deformities or trauma, no asymmetry in tone, positive for peripheral edema, peripheral pulses palpated, no tenderness to palpation in the extremities  Data Reviewed: In the ED Temp 99.2, heart rate 76, respiratory rate 22, blood pressure 125/62, satting at 91% No leukocytosis with a white blood cell count of 7.5, hemoglobin 8.4, platelets 195 Chemistry is unremarkable aside from an elevated creatinine at 2.37 which is very near to her baseline, BUN 54 Albumin 2.7 BNP 1307 Troponin 200 Negative COVID test EKG shows a heart rate of 86, sinus rhythm, QTc 540 Cardiology was consulted and per ED provider request patient to be admitted to the hospitalist service at [facility] Chest x-ray shows possible acute infiltrate in the right upper lobe and possible edema Patient admitted for acute respiratory failure with hypoxia that is likely due to CHF, possible pneumonia  Assessment and Plan: * Acute respiratory failure with hypoxia (HCC) - O2 sats down to mid 80s - Requiring 2 L nasal cannula - Imaging shows possible pneumonia, and workup reveals likely CHF - Procalcitonin pending - Rocephin and Zithromax started in the ED - Continue Rocephin and Zithromax, may be able to de-escalate antibiotics after procalcitonin results - Wean off O2 as tolerated - Continue to monitor  Elevated troponin - Troponin flat at 202 and 198 - Likely demand ischemia in the setting of CHF exacerbation - Cardiology consulted and recommended admission to [facility] - Inpatient cardiology consult - EKG is without acute ischemic changes - Monitor on telemetry  Acute CHF (HCC) - With dyspnea on exertion, peripheral edema, cardiomegaly, edema on chest x-ray, pulmonary effusions, and orthopnea - BNP 1307 - Elevated troponin - Takes Lasix 20 mg twice daily at home - Lasix 40 mg IV given in the ER - Continue Lasix 40 mg IV twice daily - Reds clip - Monitor intake and  output - Fluid restrictions - Heart healthy diet - Daily weights - Echo in the a.m., last echo was almost a decade ago - Cardiology consulted and recommended admission to [facility] per the EDP - Associated hypoxia requiring 2 L nasal cannula - Continue to monitor on telemetry  Prolonged QT interval - Avoid QT prolonging agents when possible - Monitor on telemetry - Monitor and correct electrolytes as indicated  Thyroid disease - Continue Synthroid - Check TSH in the setting of acute CHF exacerbation  GERD (gastroesophageal reflux disease) - Continue Pepcid  Type 2 diabetes mellitus (HCC) - Holding glimepiride - Sliding scale coverage - Hemoglobin A1c  Cardiac murmur - Known, chronic - Will be evaluated on updated echo  Hyperlipidemia with target low density lipoprotein (LDL) cholesterol less than 100 mg/dL - Continue statin  Hypertension - Continue amlodipine and metoprolol      Advance Care Planning:   Code Status: Full Code  Consults: Cardiology  Family Communication: Granddaughter at bedside  Severity of Illness: The appropriate patient status for this patient is OBSERVATION. Observation status is judged to be reasonable and necessary in order to provide the required intensity of service to ensure the patient's safety. The patient's presenting symptoms, physical exam findings, and initial radiographic and laboratory data in the context of their medical condition is felt to place them at decreased risk for further clinical deterioration. Furthermore, it is anticipated that the patient will be medically stable for discharge from the hospital within 2 midnights of admission.   Author: Lilyan Gilford, DO 07/02/2022 9:31 PM  For on call review [email or web address].

## 2022-07-02 NOTE — Assessment & Plan Note (Signed)
-   Troponin flat at 202 and 198 - Likely demand ischemia in the setting of CHF exacerbation - Cardiology consulted and recommended admission to [facility] - Inpatient cardiology consult - EKG is without acute ischemic changes - Monitor on telemetry

## 2022-07-02 NOTE — Assessment & Plan Note (Signed)
-   O2 sats down to mid 80s - Requiring 2 L nasal cannula - Imaging shows possible pneumonia, and workup reveals likely CHF - Procalcitonin pending - Rocephin and Zithromax started in the ED - Continue Rocephin and Zithromax, may be able to de-escalate antibiotics after procalcitonin results - Wean off O2 as tolerated - Continue to monitor

## 2022-07-03 ENCOUNTER — Observation Stay (HOSPITAL_COMMUNITY): Payer: 59

## 2022-07-03 ENCOUNTER — Inpatient Hospital Stay (HOSPITAL_COMMUNITY): Payer: 59

## 2022-07-03 DIAGNOSIS — E782 Mixed hyperlipidemia: Secondary | ICD-10-CM | POA: Diagnosis present

## 2022-07-03 DIAGNOSIS — I509 Heart failure, unspecified: Secondary | ICD-10-CM

## 2022-07-03 DIAGNOSIS — K5900 Constipation, unspecified: Secondary | ICD-10-CM | POA: Diagnosis present

## 2022-07-03 DIAGNOSIS — N184 Chronic kidney disease, stage 4 (severe): Secondary | ICD-10-CM | POA: Diagnosis not present

## 2022-07-03 DIAGNOSIS — Z79899 Other long term (current) drug therapy: Secondary | ICD-10-CM | POA: Diagnosis not present

## 2022-07-03 DIAGNOSIS — I350 Nonrheumatic aortic (valve) stenosis: Secondary | ICD-10-CM | POA: Diagnosis present

## 2022-07-03 DIAGNOSIS — Z66 Do not resuscitate: Secondary | ICD-10-CM | POA: Diagnosis not present

## 2022-07-03 DIAGNOSIS — R7989 Other specified abnormal findings of blood chemistry: Secondary | ICD-10-CM | POA: Diagnosis not present

## 2022-07-03 DIAGNOSIS — E1122 Type 2 diabetes mellitus with diabetic chronic kidney disease: Secondary | ICD-10-CM | POA: Diagnosis not present

## 2022-07-03 DIAGNOSIS — M7989 Other specified soft tissue disorders: Secondary | ICD-10-CM

## 2022-07-03 DIAGNOSIS — R0603 Acute respiratory distress: Secondary | ICD-10-CM

## 2022-07-03 DIAGNOSIS — J9601 Acute respiratory failure with hypoxia: Secondary | ICD-10-CM | POA: Diagnosis not present

## 2022-07-03 DIAGNOSIS — Z96642 Presence of left artificial hip joint: Secondary | ICD-10-CM | POA: Diagnosis present

## 2022-07-03 DIAGNOSIS — J189 Pneumonia, unspecified organism: Secondary | ICD-10-CM | POA: Diagnosis present

## 2022-07-03 DIAGNOSIS — E1165 Type 2 diabetes mellitus with hyperglycemia: Secondary | ICD-10-CM | POA: Diagnosis present

## 2022-07-03 DIAGNOSIS — D638 Anemia in other chronic diseases classified elsewhere: Secondary | ICD-10-CM | POA: Diagnosis not present

## 2022-07-03 DIAGNOSIS — E039 Hypothyroidism, unspecified: Secondary | ICD-10-CM | POA: Diagnosis present

## 2022-07-03 DIAGNOSIS — Z1152 Encounter for screening for COVID-19: Secondary | ICD-10-CM | POA: Diagnosis not present

## 2022-07-03 DIAGNOSIS — I2510 Atherosclerotic heart disease of native coronary artery without angina pectoris: Secondary | ICD-10-CM | POA: Diagnosis present

## 2022-07-03 DIAGNOSIS — Z8249 Family history of ischemic heart disease and other diseases of the circulatory system: Secondary | ICD-10-CM | POA: Diagnosis not present

## 2022-07-03 DIAGNOSIS — R0602 Shortness of breath: Secondary | ICD-10-CM | POA: Diagnosis not present

## 2022-07-03 DIAGNOSIS — K219 Gastro-esophageal reflux disease without esophagitis: Secondary | ICD-10-CM | POA: Diagnosis present

## 2022-07-03 DIAGNOSIS — I5043 Acute on chronic combined systolic (congestive) and diastolic (congestive) heart failure: Secondary | ICD-10-CM | POA: Diagnosis not present

## 2022-07-03 DIAGNOSIS — I10 Essential (primary) hypertension: Secondary | ICD-10-CM | POA: Diagnosis not present

## 2022-07-03 DIAGNOSIS — I2489 Other forms of acute ischemic heart disease: Secondary | ICD-10-CM | POA: Diagnosis not present

## 2022-07-03 DIAGNOSIS — I5021 Acute systolic (congestive) heart failure: Secondary | ICD-10-CM | POA: Diagnosis not present

## 2022-07-03 DIAGNOSIS — I130 Hypertensive heart and chronic kidney disease with heart failure and stage 1 through stage 4 chronic kidney disease, or unspecified chronic kidney disease: Secondary | ICD-10-CM | POA: Diagnosis not present

## 2022-07-03 DIAGNOSIS — Z961 Presence of intraocular lens: Secondary | ICD-10-CM | POA: Diagnosis present

## 2022-07-03 DIAGNOSIS — Z87891 Personal history of nicotine dependence: Secondary | ICD-10-CM | POA: Diagnosis not present

## 2022-07-03 DIAGNOSIS — N179 Acute kidney failure, unspecified: Secondary | ICD-10-CM | POA: Diagnosis not present

## 2022-07-03 DIAGNOSIS — Z515 Encounter for palliative care: Secondary | ICD-10-CM | POA: Diagnosis not present

## 2022-07-03 DIAGNOSIS — J90 Pleural effusion, not elsewhere classified: Secondary | ICD-10-CM | POA: Diagnosis not present

## 2022-07-03 DIAGNOSIS — D631 Anemia in chronic kidney disease: Secondary | ICD-10-CM | POA: Diagnosis not present

## 2022-07-03 LAB — ECHOCARDIOGRAM COMPLETE
AR max vel: 1.85 cm2
AV Area VTI: 1.77 cm2
AV Area mean vel: 1.7 cm2
AV Mean grad: 13.5 mmHg
AV Peak grad: 23.3 mmHg
Ao pk vel: 2.42 m/s
Area-P 1/2: 4.8 cm2
Height: 64 in
MV M vel: 4.31 m/s
MV Peak grad: 74.3 mmHg
P 1/2 time: 373 msec
S' Lateral: 3.3 cm
Weight: 2229.29 oz

## 2022-07-03 LAB — CBC WITH DIFFERENTIAL/PLATELET
Abs Immature Granulocytes: 0.05 10*3/uL (ref 0.00–0.07)
Basophils Absolute: 0.1 10*3/uL (ref 0.0–0.1)
Basophils Relative: 1 %
Eosinophils Absolute: 0.1 10*3/uL (ref 0.0–0.5)
Eosinophils Relative: 1 %
HCT: 27.7 % — ABNORMAL LOW (ref 36.0–46.0)
Hemoglobin: 8.9 g/dL — ABNORMAL LOW (ref 12.0–15.0)
Immature Granulocytes: 1 %
Lymphocytes Relative: 18 %
Lymphs Abs: 1.7 10*3/uL (ref 0.7–4.0)
MCH: 26.5 pg (ref 26.0–34.0)
MCHC: 32.1 g/dL (ref 30.0–36.0)
MCV: 82.4 fL (ref 80.0–100.0)
Monocytes Absolute: 0.6 10*3/uL (ref 0.1–1.0)
Monocytes Relative: 6 %
Neutro Abs: 7.3 10*3/uL (ref 1.7–7.7)
Neutrophils Relative %: 73 %
Platelets: 236 10*3/uL (ref 150–400)
RBC: 3.36 MIL/uL — ABNORMAL LOW (ref 3.87–5.11)
RDW: 16.4 % — ABNORMAL HIGH (ref 11.5–15.5)
WBC: 9.8 10*3/uL (ref 4.0–10.5)
nRBC: 0 % (ref 0.0–0.2)

## 2022-07-03 LAB — COMPREHENSIVE METABOLIC PANEL
ALT: 21 U/L (ref 0–44)
AST: 29 U/L (ref 15–41)
Albumin: 2.6 g/dL — ABNORMAL LOW (ref 3.5–5.0)
Alkaline Phosphatase: 138 U/L — ABNORMAL HIGH (ref 38–126)
Anion gap: 11 (ref 5–15)
BUN: 50 mg/dL — ABNORMAL HIGH (ref 8–23)
CO2: 20 mmol/L — ABNORMAL LOW (ref 22–32)
Calcium: 9.3 mg/dL (ref 8.9–10.3)
Chloride: 105 mmol/L (ref 98–111)
Creatinine, Ser: 2.31 mg/dL — ABNORMAL HIGH (ref 0.44–1.00)
GFR, Estimated: 20 mL/min — ABNORMAL LOW (ref >60)
Glucose, Bld: 202 mg/dL — ABNORMAL HIGH (ref 70–99)
Potassium: 4.4 mmol/L (ref 3.5–5.1)
Sodium: 136 mmol/L (ref 135–145)
Total Bilirubin: 0.5 mg/dL (ref 0.3–1.2)
Total Protein: 6.9 g/dL (ref 6.5–8.1)

## 2022-07-03 LAB — URINALYSIS, ROUTINE W REFLEX MICROSCOPIC
Bacteria, UA: NONE SEEN
Bilirubin Urine: NEGATIVE
Glucose, UA: NEGATIVE mg/dL
Hgb urine dipstick: NEGATIVE
Ketones, ur: NEGATIVE mg/dL
Nitrite: NEGATIVE
Protein, ur: NEGATIVE mg/dL
Specific Gravity, Urine: 1.006 (ref 1.005–1.030)
pH: 5 (ref 5.0–8.0)

## 2022-07-03 LAB — HEMOGLOBIN A1C
Hgb A1c MFr Bld: 7.7 % — ABNORMAL HIGH (ref 4.8–5.6)
Mean Plasma Glucose: 174.29 mg/dL

## 2022-07-03 LAB — GLUCOSE, CAPILLARY
Glucose-Capillary: 184 mg/dL — ABNORMAL HIGH (ref 70–99)
Glucose-Capillary: 134 mg/dL — ABNORMAL HIGH (ref 70–99)
Glucose-Capillary: 142 mg/dL — ABNORMAL HIGH (ref 70–99)
Glucose-Capillary: 107 mg/dL — ABNORMAL HIGH (ref 70–99)

## 2022-07-03 LAB — TSH: TSH: 14.326 u[IU]/mL — ABNORMAL HIGH (ref 0.350–4.500)

## 2022-07-03 LAB — MAGNESIUM: Magnesium: 2.2 mg/dL (ref 1.7–2.4)

## 2022-07-03 MED ORDER — FUROSEMIDE 10 MG/ML IJ SOLN
40.0000 mg | Freq: Once | INTRAMUSCULAR | Status: AC
  Administered 2022-07-03: 40 mg via INTRAVENOUS
  Filled 2022-07-03: qty 4

## 2022-07-03 MED ORDER — LORAZEPAM 2 MG/ML IJ SOLN
0.5000 mg | Freq: Once | INTRAMUSCULAR | Status: AC
  Administered 2022-07-03: 0.5 mg via INTRAMUSCULAR
  Filled 2022-07-03: qty 1

## 2022-07-03 MED ORDER — HYDRALAZINE HCL 25 MG PO TABS: 25.0000 mg | ORAL_TABLET | Freq: Three times a day (TID) | ORAL | Status: DC

## 2022-07-03 MED ORDER — IPRATROPIUM-ALBUTEROL 0.5-2.5 (3) MG/3ML IN SOLN
RESPIRATORY_TRACT | Status: AC
  Administered 2022-07-03: 3 mL
  Filled 2022-07-03: qty 3

## 2022-07-03 MED ORDER — FUROSEMIDE 10 MG/ML IJ SOLN: 80.0000 mg | Freq: Two times a day (BID) | INTRAMUSCULAR | Status: DC

## 2022-07-03 MED ORDER — HYDRALAZINE HCL 25 MG PO TABS
25.0000 mg | ORAL_TABLET | Freq: Three times a day (TID) | ORAL | Status: DC
  Administered 2022-07-03 – 2022-07-08 (×16): 25 mg via ORAL
  Filled 2022-07-03 (×16): qty 1

## 2022-07-03 MED ORDER — LORAZEPAM 2 MG/ML IJ SOLN: 0.5000 mg | Freq: Two times a day (BID) | INTRAMUSCULAR | Status: DC | PRN

## 2022-07-03 MED ORDER — FUROSEMIDE 10 MG/ML IJ SOLN
10.0000 mg/h | INTRAVENOUS | Status: DC
  Administered 2022-07-03 – 2022-07-05 (×3): 10 mg/h via INTRAVENOUS
  Filled 2022-07-03 (×3): qty 20

## 2022-07-03 MED ORDER — ISOSORBIDE MONONITRATE ER 30 MG PO TB24
15.0000 mg | ORAL_TABLET | Freq: Every day | ORAL | Status: DC
  Administered 2022-07-03 – 2022-07-08 (×6): 15 mg via ORAL
  Filled 2022-07-03 (×6): qty 1

## 2022-07-03 NOTE — Progress Notes (Addendum)
[facility] PROGRESS NOTE   Marie Hunt ZOX:[id number] DOB: 03-Dec-1932 DOA: 07/02/2022  PCP: Kirstie Peri, MD  Brief History/Interval Summary: 89 y.o. female with medical history significant for CAD, hypertension, GERD, type 2 diabetes mellitus, thyroid disease, hyperlipidemia, and more presented to the ED with a chief complaint of dyspnea.  Imaging studies raise concern for CHF versus pneumonia.  She was hospitalized for further management.    Consultants: Cardiology  Procedures: Transthoracic echocardiogram is pending    Subjective/Interval History: Patient mentioned that she gets very short of breath with minimal exertion.  Got very short of breath when she went to the bedside commode.  However feels better after being in the bed for few minutes.  Oxygen requirements did go up significantly has resolved.  Denies any chest pain.  Her granddaughter is at the bedside.    Assessment/Plan:  Acute respiratory failure with hypoxia Does not typically use oxygen at home.  Currently requiring high flow nasal cannula at 10 L/min.  Chest x-ray repeated this morning and shows worsening opacities.  WBC was normal.  This appears to be mostly fluid overload rather than infection although infection cannot be definitely ruled out. Will give additional dose of furosemide.  She is already on empiric antibiotics which will be continued. Procalcitonin noted to be 0.32 COVID-19 PCR was negative.  Acute CHF Unknown type.  Echocardiogram is pending. Cardiology consulted.  BNP was noted to be elevated.  Patient is on furosemide.  Dose to be increased this morning.  She does have chronic kidney disease which will need to be monitored closely. Mildly elevated troponin likely suggestive of demand ischemia rather than an acute coronary syndrome.  Follow-up on echocardiogram report. Strict ins and outs and daily weights. Lower extremity, likely due to CHF.  Will also get Doppler studies to rule  out DVT.  Chronic kidney disease stage 4 Baseline creatinine seems to be between 2.0-2.5.  Seems to be close to baseline.  Monitor closely while she is getting diuresed.  Prolonged QT interval Monitor on telemetry.  Hypothyroidism Levothyroxine being continued.  TSH noted to be quite elevated at 14.32.  Will check free T4 in the morning.  Diabetes mellitus type 2, uncontrolled with hyperglycemia HbA1c 7.7.  Continue SSI.  Holding glimepiride.  Anemia of chronic kidney disease Lower than her usual baseline.  Last year her hemoglobin was 11.5.  Will check anemia panel.  No evidence of overt bleeding.  Hyperlipidemia Continue statin.  Essential hypertension Monitor blood pressures.  Noted to be on amlodipine and metoprolol.   GERD (gastroesophageal reflux disease) Continue Pepcid   Goals of care Will need to be addressed depending on results of testing and patient's clinical status.  May benefit from palliative care input.  DVT Prophylaxis: Subcutaneous heparin Code Status: Full code Family Communication: Discussed with granddaughter and patient Disposition Plan: To be determined  Status is: Observation The patient will require care spanning > 2 midnights and should be moved to inpatient because: Acute CHF, acute respiratory failure with hypoxia      Medications: Scheduled:  amLODipine  10 mg Oral q morning   aspirin EC  81 mg Oral QPM   famotidine  20 mg Oral QHS   furosemide  80 mg Intravenous BID   heparin  5,000 Units Subcutaneous Q8H   insulin aspart  0-15 Units Subcutaneous TID WC   insulin aspart  0-5 Units Subcutaneous QHS   levothyroxine  25 mcg Oral Q0600   melatonin  6 mg Oral  QPM   metoprolol succinate  25 mg Oral q morning   simvastatin  20 mg Oral QHS   traZODone  50 mg Oral QHS   Continuous:  azithromycin     cefTRIAXone (ROCEPHIN)  IV     [id number] **OR** acetaminophen, guaiFENesin-dextromethorphan, ondansetron **OR** ondansetron  (ZOFRAN) IV, oxyCODONE, polyethylene glycol  Antibiotics: Anti-infectives (From admission, onward)    Start     Dose/Rate Route Frequency Ordered Stop   07/03/22 1800  cefTRIAXone (ROCEPHIN) 1 g in sodium chloride 0.9 % 100 mL IVPB        1 g 200 mL/hr over 30 Minutes Intravenous Every 24 hours 07/02/22 2139     07/03/22 1800  azithromycin (ZITHROMAX) 500 mg in sodium chloride 0.9 % 250 mL IVPB        500 mg 250 mL/hr over 60 Minutes Intravenous Every 24 hours 07/02/22 2139     07/02/22 1745  cefTRIAXone (ROCEPHIN) 1 g in sodium chloride 0.9 % 100 mL IVPB        1 g 200 mL/hr over 30 Minutes Intravenous  Once 07/02/22 1734 07/02/22 1920   07/02/22 1745  azithromycin (ZITHROMAX) 500 mg in sodium chloride 0.9 % 250 mL IVPB        500 mg 250 mL/hr over 60 Minutes Intravenous  Once 07/02/22 1734 07/02/22 2057       Objective:  Vital Signs  Vitals:   07/02/22 2136 07/03/22 0039 07/03/22 0656 07/03/22 0804  BP: (!) 139/90 (!) 154/92 (!) 123/55   Pulse: 99 96 86   Resp: 18 20 16    Temp: 98.7 F (37.1 C) 97.8 F (36.6 C) 97.6 F (36.4 C)   TempSrc: Oral Oral Axillary   SpO2: 92% 90% 94% 90%  Weight: 63.2 kg  63.2 kg     Intake/Output Summary (Last 24 hours) at 07/03/2022 0934 Last data filed at 07/02/2022 2300 Gross per 24 hour  Intake 240 ml  Output 300 ml  Net -60 ml   Filed Weights   07/02/22 2136 07/03/22 0656  Weight: 63.2 kg 63.2 kg    General appearance: Awake alert.  In no distress Resp: Tachypneic at rest.  No use of accessory muscles.  Crackles heard bilaterally.  No wheezing or rhonchi.  Significant pedal edema noted. Cardio: S1-S2 is normal regular.  No S3-S4.  No rubs murmurs or bruit GI: Abdomen is soft.  Nontender nondistended.  Bowel sounds are present normal.  No masses organomegaly Extremities: Significant pedal edema noted. Neurologic: Alert and oriented x3.  No focal neurological deficits.    Lab Results:  Data Reviewed: I have personally reviewed  following labs and reports of the imaging studies  CBC: Recent Labs  Lab 07/02/22 1555 07/03/22 0418  WBC 7.5 9.8  NEUTROABS 5.1 7.3  HGB 8.4* 8.9*  HCT 27.2* 27.7*  MCV 86.1 82.4  PLT 195 236    Basic Metabolic Panel: Recent Labs  Lab 07/02/22 1555 07/02/22 1938 07/03/22 0418  NA 134* 136 136  K 3.7 3.7 4.4  CL 102 103 105  CO2 22 22 20*  GLUCOSE 211* 183* 202*  BUN 54* 55* 50*  CREATININE 2.37* 2.40* 2.31*  CALCIUM 8.9 9.1 9.3  MG  --   --  2.2    GFR: CrCl cannot be calculated (Unknown ideal weight.).  Liver Function Tests: Recent Labs  Lab 07/02/22 1555 07/03/22 0418  AST 27 29  ALT 20 21  ALKPHOS 130* 138*  BILITOT 0.5 0.5  PROT 7.0  6.9  ALBUMIN 2.7* 2.6*     HbA1C: Recent Labs    07/03/22 0418  HGBA1C 7.7*    CBG: Recent Labs  Lab 07/02/22 2147 07/03/22 0757  GLUCAP 184* 184*      Thyroid Function Tests: Recent Labs    07/03/22 0418  TSH 14.326*     Recent Results (from the past 240 hour(s))  SARS Coronavirus 2 by RT PCR (hospital order, performed in [facility] hospital lab) *cepheid single result test* Anterior Nasal Swab     Status: None   Collection Time: 07/02/22  5:45 PM   Specimen: Anterior Nasal Swab  Result Value Ref Range Status   SARS Coronavirus 2 by RT PCR NEGATIVE NEGATIVE Final    Comment: (NOTE) SARS-CoV-2 target nucleic acids are NOT DETECTED.  The SARS-CoV-2 RNA is generally detectable in upper and lower respiratory specimens during the acute phase of infection. The lowest concentration of SARS-CoV-2 viral copies this assay can detect is 250 copies / mL. A negative result does not preclude SARS-CoV-2 infection and should not be used as the sole basis for treatment or other patient management decisions.  A negative result may occur with improper specimen collection / handling, submission of specimen other than nasopharyngeal swab, presence of viral mutation(s) within the areas targeted by this assay, and  inadequate number of viral copies (<250 copies / mL). A negative result must be combined with clinical observations, patient history, and epidemiological information.  Fact Sheet for Patients:   RoadLapTop.co.za  Fact Sheet for Healthcare Providers: http://kim-miller.com/  This test is not yet approved or  cleared by the [place] FDA and has been authorized for detection and/or diagnosis of SARS-CoV-2 by FDA under an Emergency Use Authorization (EUA).  This EUA will remain in effect (meaning this test can be used) for the duration of the COVID-19 declaration under Section 564(b)(1) of the Act, 21 U.S.C. section 360bbb-3(b)(1), unless the authorization is terminated or revoked sooner.  Performed at [facility], [address] [place], [place] [address]       Radiology Studies: DG CHEST PORT 1 VIEW  Result Date: 07/03/2022 CLINICAL DATA:  Shortness of breath EXAM: PORTABLE CHEST 1 VIEW COMPARISON:  CXR 07/02/22 FINDINGS: Trace left pleural effusion. No pneumothorax. Unchanged cardiac and mediastinal contours. There are multifocal patchy airspace opacities in the right upper and lower lung fields, as well as the left upper lung field. No radiographically apparent displaced rib fractures. Visualized upper abdomen is unremarkable. IMPRESSION: Multifocal patchy airspace opacities in the right upper and lower lung fields, as well as the left upper lung field. FIndings are worrisome for infection. Electronically Signed   By: Lorenza Cambridge M.D.   On: 07/03/2022 09:07   DG Chest 2 View  Result Date: 07/02/2022 CLINICAL DATA:  Shortness of breath EXAM: CHEST - 2 VIEW COMPARISON:  X-ray 10/06/2014 FINDINGS: Hyperinflation. Diffuse interstitial changes. Tiny pleural effusions. Kyphotic x-ray obscures the apices. There is subtle opacity in the right upper lobe. Focal pneumonia or infiltrates possible. Recommend follow-up to confirm clearance. Enlarged  heart with calcified aorta. Osteopenia IMPRESSION: Subtle opacity right upper lobe.  Possible acute infiltrate. Enlarged heart with interstitial changes. Question component edema. Tiny effusions Kyphotic x-ray obscures the apices Electronically Signed   By: Karen Kays M.D.   On: 07/02/2022 15:42       LOS: 0 days   Rowyn Mustapha Rito Ehrlich  [facility] Pager on [email or web address]  07/03/2022, 9:34 AM

## 2022-07-03 NOTE — Progress Notes (Signed)
Pt resting comfortably on 8L HFNC (salter) with BIPAP on stand by at bedside.

## 2022-07-03 NOTE — Progress Notes (Signed)
Echocardiogram 2D Echocardiogram has been performed.  Marie Hunt 07/03/2022, 12:54 PM

## 2022-07-03 NOTE — Progress Notes (Signed)
Pt placed on bipap per order due to respiratory distress and requiring high O2 demands. Pt is tolerating it well at this time with stable VS. RT will continue to monitor as needed.

## 2022-07-03 NOTE — TOC Initial Note (Signed)
Transition of Care [facility]) - Initial/Assessment Note    Patient Details  Name: Marie Hunt MRN: [id number] Date of Birth: May 19, 1932  Transition of Care [facility] CM/SW Contact:    Gala Lewandowsky, RN Phone Number: 07/03/2022, 1:26 PM  Clinical Narrative:   Patient presented for shortness of breath-currently on the BIPAP. PTA patient was from home alone; however, has family support. Granddaughter Jeri Modena is at the bedside during the visit. [name] states that during the day M-F while she works the patient is with her daughter at the daughters home from 8:00 am -6:30 pm. [name] then picks the patient up and transports the patient to her home and [facility] stays with her until around 1:00 am and then she returns the next morning around 6:30 am. Granddaughter has concerns that the patient will benefit from SNF once stable. Case Manager has contacted MD and PT/OT will be ordered for recommendations once the patient stabilizes. Case Manager will continue to follow for transition of care needs as the patient progresses.             Expected Discharge Plan: Skilled Nursing Facility Barriers to Discharge: Continued Medical Work up   Patient Goals and CMS Choice Patient states their goals for this hospitalization and ongoing recovery are::  (patient currently on BIPAP family at the bedside and has asked for SNF.)  Expected Discharge Plan and Services In-house Referral: NA Discharge Planning Services: CM Consult Post Acute Care Choice: Skilled Nursing Facility Living arrangements for the past 2 months: Apartment                   DME Agency: NA    Prior Living Arrangements/Services Living arrangements for the past 2 months: Apartment Lives with:: Self (family stays with the patient.) Patient language and need for interpreter reviewed:: Yes        Need for Family Participation in Patient Care: Yes (Comment) Care giver support system in place?: Yes (comment)   Criminal  Activity/Legal Involvement Pertinent to Current Situation/Hospitalization: No - Comment as needed  Emotional Assessment Appearance:: Appears stated age Attitude/Demeanor/Rapport: Unable to Assess Affect (typically observed): Unable to Assess   Alcohol / Substance Use: Not Applicable Psych Involvement: No (comment)  Admission diagnosis:  Elevated troponin [R79.89] Acute respiratory failure with hypoxia (HCC) [J96.01] Acute on chronic congestive heart failure, unspecified heart failure type [facility]) [I50.9] Patient Active Problem List   Diagnosis Date Noted   Acute respiratory failure with hypoxia (HCC) 07/02/2022   GERD (gastroesophageal reflux disease) 07/02/2022   Thyroid disease 07/02/2022   Prolonged QT interval 07/02/2022   Acute CHF (HCC) 07/02/2022   Elevated troponin 07/02/2022   Acute cystitis with hematuria 04/06/2019   Malignant neoplasm of urinary bladder neck (HCC) 04/06/2019   Colitis, acute 04/24/2016   Cough 10/17/2014   Renal insufficiency 10/17/2014   UTI (urinary tract infection) 10/07/2014   Anemia 10/07/2014   Fever    Type 2 diabetes mellitus without complication (HCC)    Hip fracture (HCC) 10/02/2014   Hip fx (HCC) 10/02/2014   Type 2 diabetes mellitus (HCC)    Fall    Aftercare following surgery of the circulatory system, NEC 10/26/2013   Occlusion and stenosis of carotid artery without mention of cerebral infarction 09/17/2011   Preoperative evaluation to rule out surgical contraindication 11/15/2010   Cardiac murmur 11/15/2010   Hypertension 11/08/2010   Carotid artery disease (HCC) 11/08/2010   Diabetes in pregnancy 11/08/2010   Hyperlipidemia with target low density lipoprotein (LDL)  cholesterol less than 100 mg/dL 78/29/5621   Cholelithiasis 11/08/2010   PCP:  Kirstie Peri, MD Pharmacy:   [facility] - [place], [place] - [address] [place] [address] Phone: [phone] Fax: [phone]  Social Determinants of  Health (SDOH) Social History: SDOH Screenings   Tobacco Use: Medium Risk (08/09/2021)   Readmission Risk Interventions     No data to display

## 2022-07-03 NOTE — Progress Notes (Signed)
Lower ext venous duplex  has been completed. Refer to [facility] under chart review to view preliminary results.   07/03/2022  3:14 PM Fiona Coto, Gerarda Gunther

## 2022-07-03 NOTE — Consult Note (Addendum)
Cardiology Consultation   Patient ID: Marie Hunt MRN: [id number]; DOB: 1932-12-15  Admit date: 07/02/2022 Date of Consult: 07/03/2022  PCP:  Kirstie Peri, MD   [facility] Providers Cardiologist:  None  [facility] Cardiology 2020   Patient Profile:   Marie Hunt is a 89 y.o. female with a hx of carotid artery stenosis S/P L endarterectomy in 2012, hypertension, hyperlipidemia , CKD IV, diabetes mellitus, hyothyroidism,who is being seen 07/03/2022 for the evaluation of newly diagnosed heart failure exacerbation at the request of Dr. Rito Ehrlich.   History of Present Illness:   Marie Hunt has no significant cardiac history.  Previously seen by [facility] cardiology in 2020 primarily for the management of her left carotid artery stenosis and difficulties with lower extremity edema.  Per office visit note, an echocardiogram in 2020 noted normal LV and RV function.  After discussion with daughter, she notes that patient has known history of congestive heart failure. It is unclear whether she has had preserved/reduced function, would presume previously diastolic dysfunction. During that time, neprhology was managing her lasix, but patient has not seen cardiology or nephrology since that time   Yesterday patient was admitted for ongoing shortness of breath, orthopnea, and significant decrease in functional status.  Patient's daughter who is accompanied with her today says that since Sunday she developed a cough and has had a rapid and progressive decline in activity.  She states that she has been significantly more short of breath has had significant swelling in her lower extremities and unable to get out of bed.  Previously, patient was very functional and ambulated with a cane while able to complete all of her ADLs without assistance.  Yesterday, patient had a brief episode of chest heaviness that made it difficult for her to breathe however has not had any prior history of chest pain and  denies any today.  Currently patient has been started on IV Lasix 40 mg twice daily and has significant increase work of breathing while on 11 L of oxygen via nasal cannula. Not on O2 at home.   EKG showing NSR HR 86 with frequent PACs. Prolonged QTc of 540, troponin 200.  BNP 1307.  Chest x-ray indicating possible acute infiltrate versus pulmonary edema.  Creatinine 2.31, GFR 20, hemoglobin 8.9  Past Medical History:  Diagnosis Date   Anemia    Carotid artery disease (HCC)    1-39% RICA, patent LICA 7/12 - Dr. Edilia Bo   Cholelithiasis    Cholelithiasis    In need of cholecystectomy   Diabetes mellitus    Type II   Essential hypertension, benign    GERD (gastroesophageal reflux disease)    GERD (gastroesophageal reflux disease)    Mixed hyperlipidemia    Sigmoid diverticulosis    Sigmoid diverticulosis    Type 2 diabetes mellitus (HCC)     Past Surgical History:  Procedure Laterality Date   CAROTID ENDARTERECTOMY  08/24/2009   Left catroid endarterectomy   CATARACT EXTRACTION W/PHACO Left 08/17/2014   Procedure: CATARACT EXTRACTION PHACO AND INTRAOCULAR LENS PLACEMENT; CDE:  7.39;  Surgeon: Gemma Payor, MD;  Location: [facility];  Service: Ophthalmology;  Laterality: Left;   CATARACT EXTRACTION W/PHACO Right 10/02/2014   Procedure: CATARACT EXTRACTION PHACO AND INTRAOCULAR LENS PLACEMENT RIGHT EYE CDE=5.83;  Surgeon: Gemma Payor, MD;  Location: [facility];  Service: Ophthalmology;  Laterality: Right;   CYSTOSCOPY W/ RETROGRADES Bilateral 03/28/2019   Procedure: CYSTOSCOPY WITH BILATRAL RETROGRADE PYELOGRAM,;  Surgeon: Malen Gauze, MD;  Location: [facility];  Service: Urology;  Laterality: Bilateral;   EYE SURGERY     HIP ARTHROPLASTY Left 10/03/2014   Procedure: ARTHROPLASTY BIPOLAR HIP (HEMIARTHROPLASTY);  Surgeon: Darreld Mclean, MD;  Location: [facility];  Service: Orthopedics;  Laterality: Left;   Left carotid endarterectomy  7/11   Dr. Edilia Bo   TRANSURETHRAL RESECTION OF BLADDER TUMOR N/A  03/28/2019   Procedure: TRANSURETHRAL RESECTION OF BLADDER TUMOR (TURBT);  Surgeon: Malen Gauze, MD;  Location: [facility];  Service: Urology;  Laterality: N/A;   URETEROSCOPY Left 03/28/2019   Procedure: DIAGNOSTIC URETEROSCOPY WITH LEFT URETERAL STENT PLACEMENT;  Surgeon: Malen Gauze, MD;  Location: [facility];  Service: Urology;  Laterality: Left;     Inpatient Medications: Scheduled Meds:  amLODipine  10 mg Oral q morning   aspirin EC  81 mg Oral QPM   famotidine  20 mg Oral QHS   furosemide  40 mg Intravenous BID   heparin  5,000 Units Subcutaneous Q8H   insulin aspart  0-15 Units Subcutaneous TID WC   insulin aspart  0-5 Units Subcutaneous QHS   ipratropium-albuterol       levothyroxine  25 mcg Oral Q0600   melatonin  6 mg Oral QPM   metoprolol succinate  25 mg Oral q morning   simvastatin  20 mg Oral QHS   traZODone  50 mg Oral QHS   Continuous Infusions:  azithromycin     cefTRIAXone (ROCEPHIN)  IV     PRN Meds: acetaminophen **OR** acetaminophen, guaiFENesin-dextromethorphan, ipratropium-albuterol, ondansetron **OR** ondansetron (ZOFRAN) IV, oxyCODONE, polyethylene glycol  Allergies:    Allergies  Allergen Reactions   Codeine Nausea And Vomiting    Social History:   Social History   Socioeconomic History   Marital status: Widowed    Spouse name: Not on file   Number of children: Not on file   Years of education: Not on file   Highest education level: Not on file  Occupational History   Not on file  Tobacco Use   Smoking status: Former    Packs/day: 0.30    Years: 60.00    Additional pack years: 0.00    Total pack years: 18.00    Types: Cigarettes    Quit date: 10/27/2011    Years since quitting: 10.6   Smokeless tobacco: Never  Substance and Sexual Activity   Alcohol use: No    Alcohol/week: 0.0 standard drinks of alcohol   Drug use: No   Sexual activity: Never  Other Topics Concern   Not on file  Social History Narrative   Not on file    Social Determinants of Health   Financial Resource Strain: Not on file  Food Insecurity: Not on file  Transportation Needs: Not on file  Physical Activity: Not on file  Stress: Not on file  Social Connections: Not on file  Intimate Partner Violence: Not on file    Family History:   Family History  Problem Relation Age of Onset   Diabetes Mother    Diabetes Father    Heart attack Daughter    Other Daughter        Bleeding problems   Cancer Daughter    Coronary artery disease Other    Heart disease Sister    Diabetes Sister    Hyperlipidemia Sister    Hypertension Sister    Heart disease Sister    Heart disease Sister      ROS:  Please see the history of present illness.  All  other ROS reviewed and negative.     Physical Exam/Data:   Vitals:   07/02/22 2030 07/02/22 2136 07/03/22 0039 07/03/22 0656  BP: 133/73 (!) 139/90 (!) 154/92 (!) 123/55  Pulse:  99 96 86  Resp: 16 18 20 16   Temp:  98.7 F (37.1 C) 97.8 F (36.6 C) 97.6 F (36.4 C)  TempSrc:  Oral Oral Axillary  SpO2:  92% 90% 94%  Weight:  63.2 kg  63.2 kg    Intake/Output Summary (Last 24 hours) at 07/03/2022 0802 Last data filed at 07/02/2022 2300 Gross per 24 hour  Intake 240 ml  Output 300 ml  Net -60 ml      07/03/2022    6:56 AM 07/02/2022    9:36 PM 08/09/2021   11:12 AM  Last 3 Weights  Weight (lbs) 139 lb 5.3 oz 139 lb 5.3 oz 135 lb  Weight (kg) 63.2 kg 63.2 kg 61.236 kg     Body mass index is 23.92 kg/m.  General: Respiratory failure. Significant increased work of breathing.  On 11 L via nasal cannula HEENT: normal Neck: + JVD Vascular: No carotid bruits; Distal pulses 2+ bilaterally Cardiac:  3/6 murmur Lungs:  crackles bilaterally  Abd: soft, nontender, no hepatomegaly  Ext: 1+ pitting edema bilaterally  Musculoskeletal:  No deformities, BUE and BLE strength normal and equal Skin: warm and dry  Neuro:  CNs 2-12 intact, no focal abnormalities noted Psych:  Normal affect   EKG:   The EKG was personally reviewed and demonstrates:  NSR HR 86 with frequent PACs Telemetry:  Telemetry was personally reviewed and demonstrates: Sinus rhythm with strips of narrow complex atrial tachycardia, varying P wave morphology suggestive of MAT.  At times P waves are hard to appreciate and often irregular in sequence  Relevant CV Studies: Echo pending   Laboratory Data:  High Sensitivity Troponin:   Recent Labs  Lab 07/02/22 1555 07/02/22 1853  TROPONINIHS 202* 198*     Chemistry Recent Labs  Lab 07/02/22 1555 07/02/22 1938 07/03/22 0418  NA 134* 136 136  K 3.7 3.7 4.4  CL 102 103 105  CO2 22 22 20*  GLUCOSE 211* 183* 202*  BUN 54* 55* 50*  CREATININE 2.37* 2.40* 2.31*  CALCIUM 8.9 9.1 9.3  MG  --   --  2.2  GFRNONAA 19* 19* 20*  ANIONGAP 10 11 11     Recent Labs  Lab 07/02/22 1555 07/03/22 0418  PROT 7.0 6.9  ALBUMIN 2.7* 2.6*  AST 27 29  ALT 20 21  ALKPHOS 130* 138*  BILITOT 0.5 0.5   Lipids No results for input(s): "CHOL", "TRIG", "HDL", "LABVLDL", "LDLCALC", "CHOLHDL" in the last 168 hours.  Hematology Recent Labs  Lab 07/02/22 1555 07/03/22 0418  WBC 7.5 9.8  RBC 3.16* 3.36*  HGB 8.4* 8.9*  HCT 27.2* 27.7*  MCV 86.1 82.4  MCH 26.6 26.5  MCHC 30.9 32.1  RDW 16.4* 16.4*  PLT 195 236   Thyroid  Recent Labs  Lab 07/03/22 0418  TSH 14.326*    BNP Recent Labs  Lab 07/02/22 1555  BNP 1,307.0*    DDimer No results for input(s): "DDIMER" in the last 168 hours.   Radiology/Studies:  DG Chest 2 View  Result Date: 07/02/2022 CLINICAL DATA:  Shortness of breath EXAM: CHEST - 2 VIEW COMPARISON:  X-ray 10/06/2014 FINDINGS: Hyperinflation. Diffuse interstitial changes. Tiny pleural effusions. Kyphotic x-ray obscures the apices. There is subtle opacity in the right upper lobe. Focal pneumonia or  infiltrates possible. Recommend follow-up to confirm clearance. Enlarged heart with calcified aorta. Osteopenia IMPRESSION: Subtle opacity right upper  lobe.  Possible acute infiltrate. Enlarged heart with interstitial changes. Question component edema. Tiny effusions Kyphotic x-ray obscures the apices Electronically Signed   By: Karen Kays M.D.   On: 07/02/2022 15:42     Assessment and Plan:   Acute heart failure exacerbation  Acute respiratory failure  Patient admitted for worsening shortness of breath and significant decline in functional status. Appears to be very volume overloaded with crackles in lungs, 1+ peripheral edema, and JVD.  She has a considerable increase of work of breathing currently on 11 L via nasal cannula. She's been started on IV Lasix 40 mg twice daily with no improvement in symptoms. BNP 1300. Agree with increasing IV lasix 80mg  BID  Continue Toprol 25 mg daily PTA amlodipine 10 mg daily, may need to switch this to give more BP room for GDMT). Will wait to add on GDMT pending echo and given increase in lasix dosing and current BP. Given renal impairment options limited and would likely only tolerate hydralazine/imdur. Would not start SGLT2i or MRA with GFR <30.  Echo pending, suspect it will show reduced EF. Will plan to optimize GDMT   Hypertension See medications above  Elevated troponins Had 1 episode of chest heaviness yesterday that has since resolved without any prior history of chest pain previously.  Suspect this is likely due to CHF exacerbation rather than ACS.  Troponins 202-198  Prolonged Qtc 540 Continue to monitor will repeat EKG.  Hyperlipidemia  Continue simvastatin 20mg   CAD s/p left endarterectomy Continue aspirin 81 mg daily.   CKD stage IV (baseline Cr 2-2.5) Creatinine 2.31 appears to be close to baseline.  GFR is 20. Will continue to monitor, but may need nephrology on board.   Goals of Care Discussed with patient and daughter, they would like to speak to case manager.  Anemia  Diabetes Hypothyroidism (TSH 14, likely needs to adjust synthroid dosing)   Risk Assessment/Risk  Scores:   New York Heart Association (NYHA) Functional Class NYHA Class IV  For questions or updates, please contact [facility] Please consult [email or web address] for contact info under    Signed, Abagail Kitchens, PA-C  07/03/2022 8:02 AM   Patient seen and examined and agree with Yvonna Alanis, PA-C as detailed above.  In brief, the patient is a 89 year old female with history of CAS s/p left endarterectomy in 2012, HTN, HLD, CKD IV, DMII, and hypothyroidism who presented to the ER with worsening SOB, orthopnea and declining functional status found to have BNP 1307 and significant volume overload consistent with acute HF exacerbation for which Cardiology was consulted.  Patient previously followed by [facility] Cardiology with reports of normal EF by TTE in 2020. Has known CKD IV and has been on lasix as outpatient. She has not followed routinely with Cardiology since that time.  She presented on this admission with progressive HF symptoms noted to be significant volume overload on exam. Was given IV lasix 40mg  but with suboptimal output and this was increased to 80mg  today. On my evaluation, she is significantly volume up on 11L HFNC with visible dyspnea with talking and minimal movement. Fortunately, she is normotensive and warm on exam. Will place her on BiPAP for now and start lasix gtt at 10 with frequent monitoring of output. TTE is pending. Discussed with patient and daughter that the patient is very ill with tenuous respiratory status. Likely  she would not want to pursue HD if needed per initial discussions.  GEN: Elderly female, frail appearing, dyspneic with short sentences  Neck: JVD to the ear Cardiac: Irregular, 2/6 systolic murmur Respiratory: Crackles halfway up the lung fields on the with diminished breath sounds on the left GI: Soft, nontender, non-distended  MS: 3+ LE edema on the left to the hip; 2+ on the right. Warm Neuro:  Nonfocal  Psych: Normal affect    Plan: -Start  BiPAP now -Will start lasix gtt at 10mg   -Follow-up TTE -Change from amlodipine to imdur 15mg /hydralazine 25mg  TID for afterload reduction with holding parameters -Hold BB for now given significant volume overload; will resume once volume status improved -Close monitoring of I/Os   Laurance Flatten, MD

## 2022-07-04 ENCOUNTER — Other Ambulatory Visit: Payer: Self-pay

## 2022-07-04 DIAGNOSIS — R7989 Other specified abnormal findings of blood chemistry: Secondary | ICD-10-CM | POA: Diagnosis not present

## 2022-07-04 DIAGNOSIS — I509 Heart failure, unspecified: Secondary | ICD-10-CM | POA: Diagnosis not present

## 2022-07-04 DIAGNOSIS — I10 Essential (primary) hypertension: Secondary | ICD-10-CM | POA: Diagnosis not present

## 2022-07-04 DIAGNOSIS — J9601 Acute respiratory failure with hypoxia: Secondary | ICD-10-CM | POA: Diagnosis not present

## 2022-07-04 DIAGNOSIS — Z515 Encounter for palliative care: Secondary | ICD-10-CM

## 2022-07-04 LAB — GLUCOSE, CAPILLARY
Glucose-Capillary: 163 mg/dL — ABNORMAL HIGH (ref 70–99)
Glucose-Capillary: 54 mg/dL — ABNORMAL LOW (ref 70–99)
Glucose-Capillary: 59 mg/dL — ABNORMAL LOW (ref 70–99)
Glucose-Capillary: 105 mg/dL — ABNORMAL HIGH (ref 70–99)
Glucose-Capillary: 64 mg/dL — ABNORMAL LOW (ref 70–99)
Glucose-Capillary: 164 mg/dL — ABNORMAL HIGH (ref 70–99)
Glucose-Capillary: 175 mg/dL — ABNORMAL HIGH (ref 70–99)

## 2022-07-04 LAB — CBC
HCT: 24.7 % — ABNORMAL LOW (ref 36.0–46.0)
Hemoglobin: 7.8 g/dL — ABNORMAL LOW (ref 12.0–15.0)
MCH: 26.6 pg (ref 26.0–34.0)
MCHC: 31.6 g/dL (ref 30.0–36.0)
MCV: 84.3 fL (ref 80.0–100.0)
Platelets: 204 10*3/uL (ref 150–400)
RBC: 2.93 MIL/uL — ABNORMAL LOW (ref 3.87–5.11)
RDW: 16.7 % — ABNORMAL HIGH (ref 11.5–15.5)
WBC: 6.8 10*3/uL (ref 4.0–10.5)
nRBC: 0 % (ref 0.0–0.2)

## 2022-07-04 LAB — VITAMIN B12: Vitamin B-12: 2753 pg/mL — ABNORMAL HIGH (ref 180–914)

## 2022-07-04 LAB — IRON AND TIBC
Iron: 12 ug/dL — ABNORMAL LOW (ref 28–170)
Saturation Ratios: 6 % — ABNORMAL LOW (ref 10.4–31.8)
TIBC: 196 ug/dL — ABNORMAL LOW (ref 250–450)
UIBC: 184 ug/dL

## 2022-07-04 LAB — RETICULOCYTES
Immature Retic Fract: 31.5 % — ABNORMAL HIGH (ref 2.3–15.9)
RBC.: 2.97 MIL/uL — ABNORMAL LOW (ref 3.87–5.11)
Retic Count, Absolute: 42.5 10*3/uL (ref 19.0–186.0)
Retic Ct Pct: 1.4 % (ref 0.4–3.1)

## 2022-07-04 LAB — BASIC METABOLIC PANEL
Anion gap: 14 (ref 5–15)
BUN: 48 mg/dL — ABNORMAL HIGH (ref 8–23)
CO2: 23 mmol/L (ref 22–32)
Calcium: 8.9 mg/dL (ref 8.9–10.3)
Chloride: 102 mmol/L (ref 98–111)
Creatinine, Ser: 2.37 mg/dL — ABNORMAL HIGH (ref 0.44–1.00)
GFR, Estimated: 19 mL/min — ABNORMAL LOW (ref >60)
Glucose, Bld: 200 mg/dL — ABNORMAL HIGH (ref 70–99)
Potassium: 3.9 mmol/L (ref 3.5–5.1)
Sodium: 139 mmol/L (ref 135–145)

## 2022-07-04 LAB — T4, FREE: Free T4: 1.04 ng/dL (ref 0.61–1.12)

## 2022-07-04 LAB — FOLATE: Folate: >40 ng/mL (ref >5.9)

## 2022-07-04 LAB — FERRITIN: Ferritin: 430 ng/mL — ABNORMAL HIGH (ref 11–307)

## 2022-07-04 MED ORDER — INSULIN ASPART 100 UNIT/ML IJ SOLN
0.0000 [IU] | Freq: Three times a day (TID) | INTRAMUSCULAR | Status: DC
  Administered 2022-07-05: 1 [IU] via SUBCUTANEOUS
  Administered 2022-07-06: 2 [IU] via SUBCUTANEOUS
  Administered 2022-07-06: 3 [IU] via SUBCUTANEOUS
  Administered 2022-07-06: 2 [IU] via SUBCUTANEOUS
  Administered 2022-07-07: 1 [IU] via SUBCUTANEOUS
  Administered 2022-07-07 – 2022-07-08 (×2): 2 [IU] via SUBCUTANEOUS

## 2022-07-04 MED ORDER — LEVOTHYROXINE SODIUM 75 MCG PO TABS
37.5000 ug | ORAL_TABLET | Freq: Every day | ORAL | Status: DC
  Administered 2022-07-05 – 2022-07-08 (×4): 37.5 ug via ORAL
  Filled 2022-07-04 (×4): qty 1

## 2022-07-04 MED ORDER — DOXYCYCLINE HYCLATE 100 MG PO TABS
100.0000 mg | ORAL_TABLET | Freq: Two times a day (BID) | ORAL | Status: AC
  Administered 2022-07-04 – 2022-07-07 (×8): 100 mg via ORAL
  Filled 2022-07-04 (×8): qty 1

## 2022-07-04 NOTE — Consult Note (Signed)
Palliative Medicine Inpatient Consult Note  Consulting Provider: Dr. Rito Ehrlich  Reason for consult:   Palliative Care Consult Services Palliative Medicine Consult  Reason for Consult? GOC, CHF, CKD.   07/04/2022  HPI:  Per intake H&P --> 89 y.o. female with medical history significant for CAD, hypertension, GERD, type 2 diabetes mellitus, thyroid disease, hyperlipidemia, and more presented to the ED with a chief complaint of dyspnea.  Imaging studies raise concern for CHF versus pneumonia.  She was hospitalized for further management.    Palliative care has been asked to get involved for goals of care conversations in the setting of advanced heart failure.  Clinical Assessment/Goals of Care:  *Please note that this is a verbal dictation therefore any spelling or grammatical errors are due to the "[employer]" system interpretation.  I have reviewed medical records including EPIC notes, labs and imaging, received report from bedside RN, assessed the patient who is lying in bed in no acute distress.    I met with Krisette and her granddaughter [name] to further discuss diagnosis prognosis, GOC, EOL wishes, disposition and options.   I introduced Palliative Medicine as specialized medical care for people living with serious illness. It focuses on providing relief from the symptoms and stress of a serious illness. The goal is to improve quality of life for both the patient and the family.  Medical History Review and Understanding:  A review of Davisha's past medical history inclusive of coronary artery disease, GERD, type 2 diabetes, hyperlipidemia, hypertension was held.  Social History:  Discussed that and he is from [facility].  She had 6 children 3 of whom are living.  She has innumerable grandchildren and great-grandchildren.  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  She is someone who loves life and has a strong faith in God  practicing as a Lexicographer.  Functional and Nutritional State:  Preceding hospitalization and he was functioning well in her home where she lives independently.  Her granddaughter does live out all her close for her and stay with her from the evening till 2 in the morning.  She goes back to set her up for the day around 5:30 AM.  During the day and he spends time with her daughter.  Advance Directives:  A detailed discussion was had today regarding advanced directives.  Yes, Jeri Modena is listed as her surrogate healthcare power of attorney.  Code Status:  Concepts specific to code status, artifical feeding and hydration, continued IV antibiotics and rehospitalization was had.  The difference between a aggressive medical intervention path  and a palliative comfort care path for this patient at this time was had.   Encouraged patient/family to consider DNR/DNI status understanding evidenced based poor outcomes in similar hospitalized patient, as the cause of arrest is likely associated with advanced chronic/terminal illness rather than an easily reversible acute cardio-pulmonary event. I explained that DNR/DNI does not change the medical plan and it only comes into effect after a person has arrested (died).  It is a protective measure to keep [place] from harming the patient in their last moments of life.   Will the patient continues to oscillate in terms of which she would want.  She is clear she would not want artificial support.  I discussed with her granddaughter the idea of making her DO NOT RESUSCITATE which she would like to pursue after additional conversations are held with any son.  Discussion:  A discussion of Jaelene's  progressed heart failure was held.  We reviewed that yesterday she was having exceptionally difficult day with labored breathing and she required BiPAP for short period of time.  We reviewed that heart failure is unfortunately a chronic progressive disease and although  treatments are extremely helpful to stave off symptoms there does, point where additional support cannot be provided.  I shared at that time off dimly transition her focus from curative to symptom based.  I did introduce the topic of hospice if things should worsen which both she and her granddaughter are aware of.  For the time being we will continue supportive measures to see if Luvinia's situation improves.  Decision Maker: Jeri Modena (Granddaughter): [phone] ([place])   SUMMARY OF RECOMMENDATIONS   Full code for the time being, patients grandaughter will discuss this with her uncle prior to initiating a DNR though she is leaning towards this  Allow time for outcomes  Continue present measures of care  Ongoing support  Code Status/Advance Care Planning: FULL CODE  Palliative Prophylaxis:  Aspiration, Bowel Regimen, Delirium Protocol, Frequent Pain Assessment, Oral Care, Palliative Wound Care, and Turn Reposition  Additional Recommendations (Limitations, Scope, Preferences): Continue current care  Psycho-social/Spiritual:  Desire for further Chaplaincy support: Yes Additional Recommendations: Education on chronic disease   Prognosis: Increased 12 month mortality risk.  Discharge Planning: Unclear at this time.   Vitals:   07/03/22 2101 07/04/22 0606  BP: (!) 105/42 124/62  Pulse: 71 79  Resp: 18 20  Temp: 98 F (36.7 C) 98.3 F (36.8 C)  SpO2: 97% 100%    Intake/Output Summary (Last 24 hours) at 07/04/2022 0981 Last data filed at 07/04/2022 1914 Gross per 24 hour  Intake 130.11 ml  Output 2650 ml  Net -2519.89 ml   Last Weight  Most recent update: 07/03/2022  6:58 AM    Weight  63.2 kg (139 lb 5.3 oz)            Gen:  Elderly AA F in NAD HEENT: moist mucous membranes CV: Regular rate and rhythm  PULM:  On 5LPM HFNC ABD: soft/nontender  EXT: BLE edema  Neuro: Alert and oriented x3   PPS: 40%   This conversation/these recommendations were  discussed with patient primary care team, Dr. Rito Ehrlich  Billing based on MDM: High  ______________________________________________________ Lamarr Lulas [facility] Palliative Medicine Team Team Cell Phone: [phone] Please utilize secure chat with additional questions, if there is no response within 30 minutes please call the above phone number  Palliative Medicine Team providers are available by phone from 7am to 7pm daily and can be reached through the team cell phone.  Should this patient require assistance outside of these hours, please call the patient's attending physician.

## 2022-07-04 NOTE — Progress Notes (Addendum)
Rounding Note    Patient Name: Marie Hunt Date of Encounter: 07/04/2022  [facility] Cardiologist: Meriam Sprague, MD - with [facility]  Subjective   Upon entering the room she was found slumped over to the right with crimp in IV and [place] displaced. She woke up quickly to stimulation. I rearranged and replaced O2, restarted IV. Will ask for frequent checks to ensure her [place] is in place. She states she is much improved and her breathing is better. She remains warm.   Inpatient Medications    Scheduled Meds:  aspirin EC  81 mg Oral QPM   doxycycline  100 mg Oral Q12H   famotidine  20 mg Oral QHS   heparin  5,000 Units Subcutaneous Q8H   hydrALAZINE  25 mg Oral Q8H   insulin aspart  0-15 Units Subcutaneous TID WC   insulin aspart  0-5 Units Subcutaneous QHS   isosorbide mononitrate  15 mg Oral Daily   [START ON 07/05/2022] levothyroxine  37.5 mcg Oral Q0600   melatonin  6 mg Oral QPM   simvastatin  20 mg Oral QHS   traZODone  50 mg Oral QHS   Continuous Infusions:  furosemide (LASIX) 200 mg in dextrose 5 % 100 mL (2 mg/mL) infusion 10 mg/hr (07/04/22 8416)   PRN Meds: acetaminophen **OR** acetaminophen, guaiFENesin-dextromethorphan, LORazepam, oxyCODONE, polyethylene glycol   Vital Signs    Vitals:   07/03/22 2015 07/03/22 2101 07/04/22 0606 07/04/22 1020  BP:  (!) 105/42 124/62 120/64  Pulse: 99 71 79   Resp: 20 18 20 16   Temp:  98 F (36.7 C) 98.3 F (36.8 C)   TempSrc:  Axillary Oral   SpO2: 97% 97% 100% 98%  Weight:      Height:        Intake/Output Summary (Last 24 hours) at 07/04/2022 1046 Last data filed at 07/04/2022 6063 Gross per 24 hour  Intake 130.11 ml  Output 2350 ml  Net -2219.89 ml      07/03/2022    6:56 AM 07/02/2022    9:36 PM 08/09/2021   11:12 AM  Last 3 Weights  Weight (lbs) 139 lb 5.3 oz 139 lb 5.3 oz 135 lb  Weight (kg) 63.2 kg 63.2 kg 61.236 kg      Telemetry    Sinus rhythm - sinus arrhythmia in  the 80s - Personally Reviewed  ECG    Sinus rhythm with HR 8, poor R wave progression, consider wandering pacemaker given different p wave morphologies  - Personally Reviewed  Physical Exam   GEN: No acute distress.   Neck: + JVD Cardiac: RRR, no murmurs, rubs, or gallops.  Respiratory: crackles in bases GI: Soft, nontender, non-distended  MS: B LE edema with L > R, edematous through her thighs, tender to any palpation Neuro:  Nonfocal  Psych: Normal affect   Labs    High Sensitivity Troponin:   Recent Labs  Lab 07/02/22 1555 07/02/22 1853  TROPONINIHS 202* 198*     Chemistry Recent Labs  Lab 07/02/22 1555 07/02/22 1938 07/03/22 0418 07/04/22 0223  NA 134* 136 136 139  K 3.7 3.7 4.4 3.9  CL 102 103 105 102  CO2 22 22 20* 23  GLUCOSE 211* 183* 202* 200*  BUN 54* 55* 50* 48*  CREATININE 2.37* 2.40* 2.31* 2.37*  CALCIUM 8.9 9.1 9.3 8.9  MG  --   --  2.2  --   PROT 7.0  --  6.9  --  ALBUMIN 2.7*  --  2.6*  --   AST 27  --  29  --   ALT 20  --  21  --   ALKPHOS 130*  --  138*  --   BILITOT 0.5  --  0.5  --   GFRNONAA 19* 19* 20* 19*  ANIONGAP 10 11 11 14     Lipids No results for input(s): "CHOL", "TRIG", "HDL", "LABVLDL", "LDLCALC", "CHOLHDL" in the last 168 hours.  Hematology Recent Labs  Lab 07/02/22 1555 07/03/22 0418 07/04/22 0223  WBC 7.5 9.8 6.8  RBC 3.16* 3.36* 2.93*  2.97*  HGB 8.4* 8.9* 7.8*  HCT 27.2* 27.7* 24.7*  MCV 86.1 82.4 84.3  MCH 26.6 26.5 26.6  MCHC 30.9 32.1 31.6  RDW 16.4* 16.4* 16.7*  PLT 195 236 204   Thyroid  Recent Labs  Lab 07/03/22 0418 07/04/22 0223  TSH 14.326*  --   FREET4  --  1.04    BNP Recent Labs  Lab 07/02/22 1555  BNP 1,307.0*    DDimer No results for input(s): "DDIMER" in the last 168 hours.   Radiology    VAS [place] LOWER EXTREMITY VENOUS (DVT)  Result Date: 07/03/2022  Lower Venous DVT Study Patient Name:  SHADOE NEWGENT  Date of Exam:   07/03/2022 Medical Rec #: [id number]           Accession #:     [id number] Date of Birth: April 27, 1932           Patient Gender: F Patient Age:   35 years Exam Location:  [facility] Procedure:      VAS [place] LOWER EXTREMITY VENOUS (DVT) Referring Phys: Osvaldo Shipper --------------------------------------------------------------------------------  Indications: Swelling, and Edema.  Comparison Study: No recent studies. Performing Technologist: Marilynne Halsted RDMS, RVT  Examination Guidelines: A complete evaluation includes B-mode imaging, spectral Doppler, color Doppler, and power Doppler as needed of all accessible portions of each vessel. Bilateral testing is considered an integral part of a complete examination. Limited examinations for reoccurring indications may be performed as noted. The reflux portion of the exam is performed with the patient in reverse Trendelenburg.  +---------+---------------+---------+-----------+----------+--------------+ RIGHT    CompressibilityPhasicitySpontaneityPropertiesThrombus Aging +---------+---------------+---------+-----------+----------+--------------+ CFV      Full           Yes      Yes                                 +---------+---------------+---------+-----------+----------+--------------+ SFJ      Full                                                        +---------+---------------+---------+-----------+----------+--------------+ FV Prox  Full                                                        +---------+---------------+---------+-----------+----------+--------------+ FV Mid   Full                                                        +---------+---------------+---------+-----------+----------+--------------+  FV DistalFull                                                        +---------+---------------+---------+-----------+----------+--------------+ PFV      Full                                                         +---------+---------------+---------+-----------+----------+--------------+ POP      Full           Yes      Yes                                 +---------+---------------+---------+-----------+----------+--------------+ PTV      Full                                                        +---------+---------------+---------+-----------+----------+--------------+ PERO     Full                                                        +---------+---------------+---------+-----------+----------+--------------+   +---------+---------------+---------+-----------+----------+--------------+ LEFT     CompressibilityPhasicitySpontaneityPropertiesThrombus Aging +---------+---------------+---------+-----------+----------+--------------+ CFV      Full           Yes      Yes                                 +---------+---------------+---------+-----------+----------+--------------+ SFJ      Full                                                        +---------+---------------+---------+-----------+----------+--------------+ FV Prox  Full                                                        +---------+---------------+---------+-----------+----------+--------------+ FV Mid   Full                                                        +---------+---------------+---------+-----------+----------+--------------+ FV DistalFull                                                        +---------+---------------+---------+-----------+----------+--------------+  PFV      Full                                                        +---------+---------------+---------+-----------+----------+--------------+ POP      Full           Yes      Yes                                 +---------+---------------+---------+-----------+----------+--------------+ PTV      Full                                                         +---------+---------------+---------+-----------+----------+--------------+ PERO     Full                                                        +---------+---------------+---------+-----------+----------+--------------+     Summary: BILATERAL: - No evidence of deep vein thrombosis seen in the lower extremities, bilaterally. -No evidence of popliteal cyst, bilaterally.   *See table(s) above for measurements and observations. Electronically signed by Coral Else MD on 07/03/2022 at 9:49:23 PM.    Final    ECHOCARDIOGRAM COMPLETE  Result Date: 07/03/2022    ECHOCARDIOGRAM REPORT   Patient Name:   Marie Hunt Date of Exam: 07/03/2022 Medical Rec #:  [id number]          Height:       64.0 in Accession #:    [id number]         Weight:       139.3 lb Date of Birth:  [birth year]          BSA:          1.678 m Patient Age:    89 years           BP:           123/55 mmHg Patient Gender: F                  HR:           87 bpm. Exam Location:  Inpatient Procedure: 2D Echo, Cardiac Doppler and Color Doppler Indications:    CHF I50.9  History:        Patient has prior history of Echocardiogram examinations and                 Patient has no prior history of Echocardiogram examinations.                 CHF, CAD; Risk Factors:Hypertension, Diabetes and Dyslipidemia.  Sonographer:    Lucendia Herrlich Referring Phys: [id number] ASIA B ZIERLE-GHOSH  Sonographer Comments: Image acquisition challenging due to uncooperative patient. IMPRESSIONS  1. Inferior hypokinesis septal and apical akinesis . Left ventricular ejection fraction, by estimation, is 30 to 35%. The left ventricle has moderately decreased function. The left ventricle demonstrates regional wall motion abnormalities (see scoring diagram/findings  for description). The left ventricular internal cavity size was moderately dilated. There is mild asymmetric left ventricular hypertrophy of the basal and septal segments. Left ventricular diastolic parameters are  consistent with Grade I  diastolic dysfunction (impaired relaxation).  2. Right ventricular systolic function is normal. The right ventricular size is normal.  3. The mitral valve is abnormal. Mild mitral valve regurgitation. No evidence of mitral stenosis. Moderate mitral annular calcification.  4. The aortic valve is normal in structure. There is moderate calcification of the aortic valve. There is moderate thickening of the aortic valve. Aortic valve regurgitation is mild. Mild to moderate aortic valve stenosis.  5. The inferior vena cava is normal in size with greater than 50% respiratory variability, suggesting right atrial pressure of 3 mmHg. FINDINGS  Left Ventricle: Inferior hypokinesis septal and apical akinesis. Left ventricular ejection fraction, by estimation, is 30 to 35%. The left ventricle has moderately decreased function. The left ventricle demonstrates regional wall motion abnormalities. The left ventricular internal cavity size was moderately dilated. There is mild asymmetric left ventricular hypertrophy of the basal and septal segments. Left ventricular diastolic parameters are consistent with Grade I diastolic dysfunction (impaired relaxation). Right Ventricle: The right ventricular size is normal. No increase in right ventricular wall thickness. Right ventricular systolic function is normal. Left Atrium: Left atrial size was normal in size. Right Atrium: Right atrial size was normal in size. Pericardium: There is no evidence of pericardial effusion. Mitral Valve: The mitral valve is abnormal. There is mild thickening of the mitral valve leaflet(s). There is mild calcification of the mitral valve leaflet(s). Moderate mitral annular calcification. Mild mitral valve regurgitation. No evidence of mitral  valve stenosis. Tricuspid Valve: The tricuspid valve is normal in structure. Tricuspid valve regurgitation is mild . No evidence of tricuspid stenosis. Aortic Valve: The aortic valve is normal in  structure. There is moderate calcification of the aortic valve. There is moderate thickening of the aortic valve. Aortic valve regurgitation is mild. Aortic regurgitation PHT measures 373 msec. Mild to moderate  aortic stenosis is present. Aortic valve mean gradient measures 13.5 mmHg. Aortic valve peak gradient measures 23.3 mmHg. Aortic valve area, by VTI measures 1.77 cm. Pulmonic Valve: The pulmonic valve was normal in structure. Pulmonic valve regurgitation is not visualized. No evidence of pulmonic stenosis. Aorta: The aortic root is normal in size and structure. Venous: The inferior vena cava is normal in size with greater than 50% respiratory variability, suggesting right atrial pressure of 3 mmHg. IAS/Shunts: The interatrial septum was not well visualized.  LEFT VENTRICLE PLAX 2D LVIDd:         4.60 cm   Diastology LVIDs:         3.30 cm   LV e' medial:    8.38 cm/s LV PW:         0.90 cm   LV E/e' medial:  13.1 LV IVS:        1.10 cm   LV e' lateral:   11.10 cm/s LVOT diam:     2.20 cm   LV E/e' lateral: 9.9 LV SV:         85 LV SV Index:   51 LVOT Area:     3.80 cm  RIGHT VENTRICLE RV S prime:     12.30 cm/s TAPSE (M-mode): 2.0 cm LEFT ATRIUM             Index        RIGHT ATRIUM  Index LA diam:        3.60 cm 2.15 cm/m   RA Area:     13.35 cm LA Vol (A2C):   80.1 ml 47.74 ml/m  RA Volume:   31.85 ml  18.98 ml/m LA Vol (A4C):   84.8 ml 50.54 ml/m LA Biplane Vol: 90.0 ml 53.64 ml/m  AORTIC VALVE AV Area (Vmax):    1.85 cm AV Area (Vmean):   1.70 cm AV Area (VTI):     1.77 cm AV Vmax:           241.50 cm/s AV Vmean:          175.000 cm/s AV VTI:            0.482 m AV Peak Grad:      23.3 mmHg AV Mean Grad:      13.5 mmHg LVOT Vmax:         117.50 cm/s LVOT Vmean:        78.400 cm/s LVOT VTI:          0.224 m LVOT/AV VTI ratio: 0.47 AI PHT:            373 msec  AORTA Ao Root diam: 3.10 cm Ao Asc diam:  3.30 cm MITRAL VALVE                TRICUSPID VALVE MV Area (PHT): 4.80 cm     TR Peak  grad:   46.8 mmHg MV Decel Time: 158 msec     TR Vmax:        342.00 cm/s MR Peak grad: 74.3 mmHg MR Vmax:      431.00 cm/s   SHUNTS MV E velocity: 110.00 cm/s  Systemic VTI:  0.22 m MV A velocity: 83.90 cm/s   Systemic Diam: 2.20 cm MV E/A ratio:  1.31 Charlton Haws MD Electronically signed by Charlton Haws MD Signature Date/Time: 07/03/2022/3:17:50 PM    Final    DG CHEST PORT 1 VIEW  Result Date: 07/03/2022 CLINICAL DATA:  Shortness of breath EXAM: PORTABLE CHEST 1 VIEW COMPARISON:  CXR 07/02/22 FINDINGS: Trace left pleural effusion. No pneumothorax. Unchanged cardiac and mediastinal contours. There are multifocal patchy airspace opacities in the right upper and lower lung fields, as well as the left upper lung field. No radiographically apparent displaced rib fractures. Visualized upper abdomen is unremarkable. IMPRESSION: Multifocal patchy airspace opacities in the right upper and lower lung fields, as well as the left upper lung field. FIndings are worrisome for infection. Electronically Signed   By: Lorenza Cambridge M.D.   On: 07/03/2022 09:07   DG Chest 2 View  Result Date: 07/02/2022 CLINICAL DATA:  Shortness of breath EXAM: CHEST - 2 VIEW COMPARISON:  X-ray 10/06/2014 FINDINGS: Hyperinflation. Diffuse interstitial changes. Tiny pleural effusions. Kyphotic x-ray obscures the apices. There is subtle opacity in the right upper lobe. Focal pneumonia or infiltrates possible. Recommend follow-up to confirm clearance. Enlarged heart with calcified aorta. Osteopenia IMPRESSION: Subtle opacity right upper lobe.  Possible acute infiltrate. Enlarged heart with interstitial changes. Question component edema. Tiny effusions Kyphotic x-ray obscures the apices Electronically Signed   By: Karen Kays M.D.   On: 07/02/2022 15:42    Cardiac Studies   Echo 07/03/22:  1. Inferior hypokinesis septal and apical akinesis . Left ventricular  ejection fraction, by estimation, is 30 to 35%. The left ventricle has  moderately  decreased function. The left ventricle demonstrates regional  wall motion abnormalities (see scoring  diagram/findings for description).  The left ventricular internal cavity  size was moderately dilated. There is mild asymmetric left ventricular  hypertrophy of the basal and septal segments. Left ventricular diastolic  parameters are consistent with Grade I   diastolic dysfunction (impaired relaxation).   2. Right ventricular systolic function is normal. The right ventricular  size is normal.   3. The mitral valve is abnormal. Mild mitral valve regurgitation. No  evidence of mitral stenosis. Moderate mitral annular calcification.   4. The aortic valve is normal in structure. There is moderate  calcification of the aortic valve. There is moderate thickening of the  aortic valve. Aortic valve regurgitation is mild. Mild to moderate aortic  valve stenosis.   5. The inferior vena cava is normal in size with greater than 50%  respiratory variability, suggesting right atrial pressure of 3 mmHg.    LE [place] 07/03/22: BILATERAL:  - No evidence of deep vein thrombosis seen in the lower extremities,  bilaterally.  -No evidence of popliteal cyst, bilaterally.      Patient Profile     89 y.o. female with a PMH significant for CAS s/p left CEA 2012, HTN, HLD, CKD IV, DM2, and hypothyroidism who presented with acute heart failure - echo showing LVEF 30-35% with WMA.   Assessment & Plan    Acute systolic and diastolic heart failure CKD IV - BNP 1307 - LVEF 30-35% with WMA, G1DD - she did not have a strong response to 40 mg IV lasix - she was placed on a lasix drip yesterday with good diuresis - 2.6 L urine output yesterday - she was placed on BiPAP briefly, did not tolerate - currently running at 10 mg/hr - her renal function is tolerating and she looks improved today, will continue lasix drip for another 24 hrs and then hopefully plan to transition to IV lasix push - at least 80 mg IV BID -  renal function will preclude use of SGLT2i and MRA   Hypertension - hydralazine 25 mg TID, imdur 15 mg daily - also helping managed CHF   Thyroid disease TSH elevated, synthroid was adjusted, per primary   Anemia Hb today 7.8, from 8.9 yesterday and 11.5 07/2021 Not on OAC No signs of active bleeding Consider keeping Hb above 8 given frailty, renal function, and CHF   Prolonged QTC QTCb 464 today Avoid prolonging agents    Troponin elevation Not a candidate for invasive ischemic evaluation Likely a result of CHF exacerbation in the setting of CKD IV   Disposition She remains full code, but guarded prognosis has been discussed with family at bedside yesterday.    For questions or updates, please contact [facility] Please consult [email or web address] for contact info under        Signed, Marcelino Duster, PA  07/04/2022, 10:46 AM    Patient seen and examined and agree with Micah Flesher, PA as detailed above.   In brief, the patient is a 89 year old female with history of CAS s/p left endarterectomy in 2012, HTN, HLD, CKD IV, DMII, and hypothyroidism who presented to the ER with worsening SOB, orthopnea and declining functional status found to have BNP 1307 and significant volume overload consistent with acute HF exacerbation for which Cardiology was consulted.   Patient previously followed by [facility] with reports of normal EF by TTE in 2020. Has known CKD IV and has been on lasix as outpatient. She has not followed routinely with Cardiology since that time.   She presented on  this admission with progressive HF symptoms noted to be significant volume overload on exam. TTE with LVEF 30-35%, inferior hypokinesis and septal and apical akinesis, G1DD normal RV, mild MR, mild to moderate AS, mild AR. Currently, she is responding very well to IV diuresis with improvement in volume status. Will continue with medical management as able as patient is not a cath candidate  due to CKD IV due to risk for need for HD which patient would not want to pursue.   GEN: Elderly female, frail appearing, comfortable Neck: JVD to mid neck Cardiac: Irregular, 2/6 systolic murmur Respiratory: Crackles at the bases GI: Soft, nontender, non-distended  MS: 2+ LE edema on the left to the hip; 1+ on the right Neuro:  Nonfocal  Psych: Normal affect     Plan: -Plan for continued medical management of HFrEF with no plans for cath given CKD IV and risk of HD which patient does not wish to pursue  -Continue lasix gtt at 10 with plans to transition to intermittent dosing tomorrow pending volume status -Continue hydralazine 25mg  TID and imdur 15mg  daily for afterload reduction -No ACE/ARB/ARNI or spiro due to CKD IV -Hold BB for now given significant volume overload; will resume once volume status improved  Laurance Flatten, MD

## 2022-07-04 NOTE — Progress Notes (Signed)
[facility] PROGRESS NOTE   ACHANTE STURDY XBM:[id number] DOB: Jul 07, 1932 DOA: 07/02/2022  PCP: Kirstie Peri, MD  Brief History/Interval Summary: 89 y.o. female with medical history significant for CAD, hypertension, GERD, type 2 diabetes mellitus, thyroid disease, hyperlipidemia, and more presented to the ED with a chief complaint of dyspnea.  Imaging studies raise concern for CHF versus pneumonia.  She was hospitalized for further management.    Consultants: Cardiology  Procedures: Transthoracic echocardiogram    Subjective/Interval History: Patient had a better night last night.  There was 1 episode where she got agitated and tried to take her oxygen off requiring nonrebreather.  However noted to be on 5 L of oxygen by nasal cannula this morning.  Her granddaughter is at the bedside.  Patient denies any chest pain or shortness of breath.  Seems to be slightly distracted.    Assessment/Plan:  Acute respiratory failure with hypoxia Does not typically use oxygen at home.  Respiratory failure is thought to be due to pulmonary edema/CHF. Her respiratory status worsened yesterday.  She had to be bumped up to high flow nasal cannula up to 12 L/min.  She was started on Lasix infusion by cardiology.  She diuresed.  Oxygen requirements have improved this morning.  Continue current management for now.   She is already on empiric antibiotics which will be continued.  Due to prolonged QT we will change her to doxycycline. Procalcitonin noted to be 0.32. COVID-19 PCR was negative.  Acute systolic CHF Echocardiogram shows EF 30 to 35% with regional wall motion abnormalities.  Grade 1 diastolic dysfunction was also noted.  Moderate aortic stenosis also appreciated. Cardiology is following. Patient is currently on furosemide infusion.  Seems to be diuresing well. Continue strict ins and outs and daily weight. Patient noted to be on nitrates and hydralazine. Monitor renal function  closely. Lower extremity Doppler studies were negative for DVT. Mildly elevated troponin likely suggestive of demand ischemia rather than an acute coronary syndrome.    Chronic kidney disease stage 4 Baseline creatinine seems to be between 2.0-2.5.  Seems to be close to baseline.  Monitor closely while she is getting diuresed. Monitor urine output.  Prolonged QT interval Monitor on telemetry.  Hypothyroidism Levothyroxine being continued.  TSH noted to be quite elevated at 14.32.  Free T4 is noted to be 1.04.  Will increase dose of levothyroxine slightly.  Diabetes mellitus type 2, uncontrolled with hyperglycemia HbA1c 7.7.  Continue SSI.  Holding glimepiride.  Anemia of chronic kidney disease Lower than her usual baseline.  Last year her hemoglobin was 11.5.   Anemia panel reviewed.  Ferritin 430, iron 12, TIBC 196, percent saturation 6.  B12 2753 and folate greater than 40. Hemoglobin noted to be lower today.  No overt bleeding noted.  Continue to monitor.  Transfuse if it drops below 7.  Hyperlipidemia Continue statin.  Essential hypertension Monitor blood pressures.  Cardiology has made changes to antihypertensives.  Amlodipine and metoprolol were discontinued.  Currently on hydralazine and nitrates.   GERD (gastroesophageal reflux disease) Continue Pepcid   Goals of care Will need to be addressed depending on results of testing and patient's clinical status.  Palliative care has been consulted.  DVT Prophylaxis: Subcutaneous heparin Code Status: Full code Family Communication: Discussed with granddaughter and patient Disposition Plan: To be determined      Medications: Scheduled:  aspirin EC  81 mg Oral QPM   famotidine  20 mg Oral QHS   heparin  5,000 Units  Subcutaneous Q8H   hydrALAZINE  25 mg Oral Q8H   insulin aspart  0-15 Units Subcutaneous TID WC   insulin aspart  0-5 Units Subcutaneous QHS   isosorbide mononitrate  15 mg Oral Daily   levothyroxine  25  mcg Oral Q0600   melatonin  6 mg Oral QPM   simvastatin  20 mg Oral QHS   traZODone  50 mg Oral QHS   Continuous:  azithromycin 500 mg (07/03/22 1826)   cefTRIAXone (ROCEPHIN)  IV 1 g (07/03/22 1720)   furosemide (LASIX) 200 mg in dextrose 5 % 100 mL (2 mg/mL) infusion 10 mg/hr (07/04/22 1610)   RUE:[id number] **OR** acetaminophen, guaiFENesin-dextromethorphan, LORazepam, oxyCODONE, polyethylene glycol  Antibiotics: Anti-infectives (From admission, onward)    Start     Dose/Rate Route Frequency Ordered Stop   07/03/22 1800  cefTRIAXone (ROCEPHIN) 1 g in sodium chloride 0.9 % 100 mL IVPB        1 g 200 mL/hr over 30 Minutes Intravenous Every 24 hours 07/02/22 2139     07/03/22 1800  azithromycin (ZITHROMAX) 500 mg in sodium chloride 0.9 % 250 mL IVPB        500 mg 250 mL/hr over 60 Minutes Intravenous Every 24 hours 07/02/22 2139     07/02/22 1745  cefTRIAXone (ROCEPHIN) 1 g in sodium chloride 0.9 % 100 mL IVPB        1 g 200 mL/hr over 30 Minutes Intravenous  Once 07/02/22 1734 07/02/22 1920   07/02/22 1745  azithromycin (ZITHROMAX) 500 mg in sodium chloride 0.9 % 250 mL IVPB        500 mg 250 mL/hr over 60 Minutes Intravenous  Once 07/02/22 1734 07/02/22 2057       Objective:  Vital Signs  Vitals:   07/03/22 1700 07/03/22 2015 07/03/22 2101 07/04/22 0606  BP: 129/71  (!) 105/42 124/62  Pulse: (!) 106 99 71 79  Resp: 17 20 18 20   Temp: (!) 97.5 F (36.4 C)  98 F (36.7 C) 98.3 F (36.8 C)  TempSrc: Oral  Axillary Oral  SpO2: 99% 97% 97% 100%  Weight:      Height:        Intake/Output Summary (Last 24 hours) at 07/04/2022 0938 Last data filed at 07/04/2022 2956 Gross per 24 hour  Intake 130.11 ml  Output 2650 ml  Net -2519.89 ml    Filed Weights   07/02/22 2136 07/03/22 0656  Weight: 63.2 kg 63.2 kg    General appearance: Awake alert.  In no distress.  Mildly distracted Resp: Noted to be tachypneic without any use of accessory muscles.  Continues to  have crackles bilaterally.  Slightly better compared to yesterday.  No wheezing or rhonchi. Cardio: S1-S2 is normal regular.  No S3-S4.  No rubs murmurs or bruit GI: Abdomen is soft.  Nontender nondistended.  Bowel sounds are present normal.  No masses organomegaly Extremities: 2+ edema bilateral lower extremities.  Slight improvement noted. Neurologic:  No focal neurological deficits.    Lab Results:  Data Reviewed: I have personally reviewed following labs and reports of the imaging studies  CBC: Recent Labs  Lab 07/02/22 1555 07/03/22 0418 07/04/22 0223  WBC 7.5 9.8 6.8  NEUTROABS 5.1 7.3  --   HGB 8.4* 8.9* 7.8*  HCT 27.2* 27.7* 24.7*  MCV 86.1 82.4 84.3  PLT 195 236 204     Basic Metabolic Panel: Recent Labs  Lab 07/02/22 1555 07/02/22 1938 07/03/22 0418 07/04/22 0223  NA 134*  136 136 139  K 3.7 3.7 4.4 3.9  CL 102 103 105 102  CO2 22 22 20* 23  GLUCOSE 211* 183* 202* 200*  BUN 54* 55* 50* 48*  CREATININE 2.37* 2.40* 2.31* 2.37*  CALCIUM 8.9 9.1 9.3 8.9  MG  --   --  2.2  --      GFR: Estimated Creatinine Clearance: 14.4 mL/min (A) (by C-G formula based on SCr of 2.37 mg/dL (H)).  Liver Function Tests: Recent Labs  Lab 07/02/22 1555 07/03/22 0418  AST 27 29  ALT 20 21  ALKPHOS 130* 138*  BILITOT 0.5 0.5  PROT 7.0 6.9  ALBUMIN 2.7* 2.6*      HbA1C: Recent Labs    07/03/22 0418  HGBA1C 7.7*     CBG: Recent Labs  Lab 07/03/22 0757 07/03/22 1130 07/03/22 1615 07/03/22 2057 07/04/22 0831  GLUCAP 184* 134* 142* 107* 163*       Thyroid Function Tests: Recent Labs    07/03/22 0418 07/04/22 0223  TSH 14.326*  --   FREET4  --  1.04      Recent Results (from the past 240 hour(s))  SARS Coronavirus 2 by RT PCR (hospital order, performed in [facility] hospital lab) *cepheid single result test* Anterior Nasal Swab     Status: None   Collection Time: 07/02/22  5:45 PM   Specimen: Anterior Nasal Swab  Result Value Ref Range  Status   SARS Coronavirus 2 by RT PCR NEGATIVE NEGATIVE Final    Comment: (NOTE) SARS-CoV-2 target nucleic acids are NOT DETECTED.  The SARS-CoV-2 RNA is generally detectable in upper and lower respiratory specimens during the acute phase of infection. The lowest concentration of SARS-CoV-2 viral copies this assay can detect is 250 copies / mL. A negative result does not preclude SARS-CoV-2 infection and should not be used as the sole basis for treatment or other patient management decisions.  A negative result may occur with improper specimen collection / handling, submission of specimen other than nasopharyngeal swab, presence of viral mutation(s) within the areas targeted by this assay, and inadequate number of viral copies (<250 copies / mL). A negative result must be combined with clinical observations, patient history, and epidemiological information.  Fact Sheet for Patients:   RoadLapTop.co.za  Fact Sheet for Healthcare Providers: http://kim-miller.com/  This test is not yet approved or  cleared by the [place] FDA and has been authorized for detection and/or diagnosis of SARS-CoV-2 by FDA under an Emergency Use Authorization (EUA).  This EUA will remain in effect (meaning this test can be used) for the duration of the COVID-19 declaration under Section 564(b)(1) of the Act, 21 U.S.C. section 360bbb-3(b)(1), unless the authorization is terminated or revoked sooner.  Performed at [facility], [address], [place], [place] [address]       Radiology Studies: VAS [place] LOWER EXTREMITY VENOUS (DVT)  Result Date: 07/03/2022  Lower Venous DVT Study Patient Name:  AARIANNA NALLEY  Date of Exam:   07/03/2022 Medical Rec #: [id number]           Accession #:    [id number] Date of Birth: [birth year]           Patient Gender: F Patient Age:   89 years Exam Location:  [facility] Procedure:      VAS [place] LOWER EXTREMITY VENOUS (DVT)  Referring Phys: Osvaldo Shipper --------------------------------------------------------------------------------  Indications: Swelling, and Edema.  Comparison Study: No recent studies. Performing Technologist: Marilynne Halsted RDMS,  RVT  Examination Guidelines: A complete evaluation includes B-mode imaging, spectral Doppler, color Doppler, and power Doppler as needed of all accessible portions of each vessel. Bilateral testing is considered an integral part of a complete examination. Limited examinations for reoccurring indications may be performed as noted. The reflux portion of the exam is performed with the patient in reverse Trendelenburg.  +---------+---------------+---------+-----------+----------+--------------+ RIGHT    CompressibilityPhasicitySpontaneityPropertiesThrombus Aging +---------+---------------+---------+-----------+----------+--------------+ CFV      Full           Yes      Yes                                 +---------+---------------+---------+-----------+----------+--------------+ SFJ      Full                                                        +---------+---------------+---------+-----------+----------+--------------+ FV Prox  Full                                                        +---------+---------------+---------+-----------+----------+--------------+ FV Mid   Full                                                        +---------+---------------+---------+-----------+----------+--------------+ FV DistalFull                                                        +---------+---------------+---------+-----------+----------+--------------+ PFV      Full                                                        +---------+---------------+---------+-----------+----------+--------------+ POP      Full           Yes      Yes                                 +---------+---------------+---------+-----------+----------+--------------+ PTV       Full                                                        +---------+---------------+---------+-----------+----------+--------------+ PERO     Full                                                        +---------+---------------+---------+-----------+----------+--------------+   +---------+---------------+---------+-----------+----------+--------------+  LEFT     CompressibilityPhasicitySpontaneityPropertiesThrombus Aging +---------+---------------+---------+-----------+----------+--------------+ CFV      Full           Yes      Yes                                 +---------+---------------+---------+-----------+----------+--------------+ SFJ      Full                                                        +---------+---------------+---------+-----------+----------+--------------+ FV Prox  Full                                                        +---------+---------------+---------+-----------+----------+--------------+ FV Mid   Full                                                        +---------+---------------+---------+-----------+----------+--------------+ FV DistalFull                                                        +---------+---------------+---------+-----------+----------+--------------+ PFV      Full                                                        +---------+---------------+---------+-----------+----------+--------------+ POP      Full           Yes      Yes                                 +---------+---------------+---------+-----------+----------+--------------+ PTV      Full                                                        +---------+---------------+---------+-----------+----------+--------------+ PERO     Full                                                        +---------+---------------+---------+-----------+----------+--------------+     Summary: BILATERAL: - No evidence of deep vein  thrombosis seen in the lower extremities, bilaterally. -No evidence of popliteal cyst, bilaterally.   *See table(s) above for measurements and observations. Electronically signed by Coral Else MD on 07/03/2022 at 9:49:23 PM.  Final    ECHOCARDIOGRAM COMPLETE  Result Date: 07/03/2022    ECHOCARDIOGRAM REPORT   Patient Name:   SABRA PLANTZ Date of Exam: 07/03/2022 Medical Rec #:  [id number]          Height:       64.0 in Accession #:    [id number]         Weight:       139.3 lb Date of Birth:  1932-12-28          BSA:          1.678 m Patient Age:    89 years           BP:           123/55 mmHg Patient Gender: F                  HR:           87 bpm. Exam Location:  Inpatient Procedure: 2D Echo, Cardiac Doppler and Color Doppler Indications:    CHF I50.9  History:        Patient has prior history of Echocardiogram examinations and                 Patient has no prior history of Echocardiogram examinations.                 CHF, CAD; Risk Factors:Hypertension, Diabetes and Dyslipidemia.  Sonographer:    Lucendia Herrlich Referring Phys: [id number] ASIA B ZIERLE-GHOSH  Sonographer Comments: Image acquisition challenging due to uncooperative patient. IMPRESSIONS  1. Inferior hypokinesis septal and apical akinesis . Left ventricular ejection fraction, by estimation, is 30 to 35%. The left ventricle has moderately decreased function. The left ventricle demonstrates regional wall motion abnormalities (see scoring diagram/findings for description). The left ventricular internal cavity size was moderately dilated. There is mild asymmetric left ventricular hypertrophy of the basal and septal segments. Left ventricular diastolic parameters are consistent with Grade I  diastolic dysfunction (impaired relaxation).  2. Right ventricular systolic function is normal. The right ventricular size is normal.  3. The mitral valve is abnormal. Mild mitral valve regurgitation. No evidence of mitral stenosis. Moderate mitral annular  calcification.  4. The aortic valve is normal in structure. There is moderate calcification of the aortic valve. There is moderate thickening of the aortic valve. Aortic valve regurgitation is mild. Mild to moderate aortic valve stenosis.  5. The inferior vena cava is normal in size with greater than 50% respiratory variability, suggesting right atrial pressure of 3 mmHg. FINDINGS  Left Ventricle: Inferior hypokinesis septal and apical akinesis. Left ventricular ejection fraction, by estimation, is 30 to 35%. The left ventricle has moderately decreased function. The left ventricle demonstrates regional wall motion abnormalities. The left ventricular internal cavity size was moderately dilated. There is mild asymmetric left ventricular hypertrophy of the basal and septal segments. Left ventricular diastolic parameters are consistent with Grade I diastolic dysfunction (impaired relaxation). Right Ventricle: The right ventricular size is normal. No increase in right ventricular wall thickness. Right ventricular systolic function is normal. Left Atrium: Left atrial size was normal in size. Right Atrium: Right atrial size was normal in size. Pericardium: There is no evidence of pericardial effusion. Mitral Valve: The mitral valve is abnormal. There is mild thickening of the mitral valve leaflet(s). There is mild calcification of the mitral valve leaflet(s). Moderate mitral annular calcification. Mild mitral valve regurgitation. No evidence of mitral  valve stenosis. Tricuspid Valve: The tricuspid valve  is normal in structure. Tricuspid valve regurgitation is mild . No evidence of tricuspid stenosis. Aortic Valve: The aortic valve is normal in structure. There is moderate calcification of the aortic valve. There is moderate thickening of the aortic valve. Aortic valve regurgitation is mild. Aortic regurgitation PHT measures 373 msec. Mild to moderate  aortic stenosis is present. Aortic valve mean gradient measures 13.5  mmHg. Aortic valve peak gradient measures 23.3 mmHg. Aortic valve area, by VTI measures 1.77 cm. Pulmonic Valve: The pulmonic valve was normal in structure. Pulmonic valve regurgitation is not visualized. No evidence of pulmonic stenosis. Aorta: The aortic root is normal in size and structure. Venous: The inferior vena cava is normal in size with greater than 50% respiratory variability, suggesting right atrial pressure of 3 mmHg. IAS/Shunts: The interatrial septum was not well visualized.  LEFT VENTRICLE PLAX 2D LVIDd:         4.60 cm   Diastology LVIDs:         3.30 cm   LV e' medial:    8.38 cm/s LV PW:         0.90 cm   LV E/e' medial:  13.1 LV IVS:        1.10 cm   LV e' lateral:   11.10 cm/s LVOT diam:     2.20 cm   LV E/e' lateral: 9.9 LV SV:         85 LV SV Index:   51 LVOT Area:     3.80 cm  RIGHT VENTRICLE RV S prime:     12.30 cm/s TAPSE (M-mode): 2.0 cm LEFT ATRIUM             Index        RIGHT ATRIUM           Index LA diam:        3.60 cm 2.15 cm/m   RA Area:     13.35 cm LA Vol (A2C):   80.1 ml 47.74 ml/m  RA Volume:   31.85 ml  18.98 ml/m LA Vol (A4C):   84.8 ml 50.54 ml/m LA Biplane Vol: 90.0 ml 53.64 ml/m  AORTIC VALVE AV Area (Vmax):    1.85 cm AV Area (Vmean):   1.70 cm AV Area (VTI):     1.77 cm AV Vmax:           241.50 cm/s AV Vmean:          175.000 cm/s AV VTI:            0.482 m AV Peak Grad:      23.3 mmHg AV Mean Grad:      13.5 mmHg LVOT Vmax:         117.50 cm/s LVOT Vmean:        78.400 cm/s LVOT VTI:          0.224 m LVOT/AV VTI ratio: 0.47 AI PHT:            373 msec  AORTA Ao Root diam: 3.10 cm Ao Asc diam:  3.30 cm MITRAL VALVE                TRICUSPID VALVE MV Area (PHT): 4.80 cm     TR Peak grad:   46.8 mmHg MV Decel Time: 158 msec     TR Vmax:        342.00 cm/s MR Peak grad: 74.3 mmHg MR Vmax:      431.00 cm/s   SHUNTS MV E  velocity: 110.00 cm/s  Systemic VTI:  0.22 m MV A velocity: 83.90 cm/s   Systemic Diam: 2.20 cm MV E/A ratio:  1.31 Charlton Haws MD  Electronically signed by Charlton Haws MD Signature Date/Time: 07/03/2022/3:17:50 PM    Final    DG CHEST PORT 1 VIEW  Result Date: 07/03/2022 CLINICAL DATA:  Shortness of breath EXAM: PORTABLE CHEST 1 VIEW COMPARISON:  CXR 07/02/22 FINDINGS: Trace left pleural effusion. No pneumothorax. Unchanged cardiac and mediastinal contours. There are multifocal patchy airspace opacities in the right upper and lower lung fields, as well as the left upper lung field. No radiographically apparent displaced rib fractures. Visualized upper abdomen is unremarkable. IMPRESSION: Multifocal patchy airspace opacities in the right upper and lower lung fields, as well as the left upper lung field. FIndings are worrisome for infection. Electronically Signed   By: Lorenza Cambridge M.D.   On: 07/03/2022 09:07   DG Chest 2 View  Result Date: 07/02/2022 CLINICAL DATA:  Shortness of breath EXAM: CHEST - 2 VIEW COMPARISON:  X-ray 10/06/2014 FINDINGS: Hyperinflation. Diffuse interstitial changes. Tiny pleural effusions. Kyphotic x-ray obscures the apices. There is subtle opacity in the right upper lobe. Focal pneumonia or infiltrates possible. Recommend follow-up to confirm clearance. Enlarged heart with calcified aorta. Osteopenia IMPRESSION: Subtle opacity right upper lobe.  Possible acute infiltrate. Enlarged heart with interstitial changes. Question component edema. Tiny effusions Kyphotic x-ray obscures the apices Electronically Signed   By: Karen Kays M.D.   On: 07/02/2022 15:42       LOS: 1 day   [employer]  [facility] Pager on [email or web address]  07/04/2022, 9:38 AM

## 2022-07-04 NOTE — Progress Notes (Signed)
PT Cancellation Note  Patient Details Name: Marie Hunt MRN: [id number] DOB: 1932-06-19   Cancelled Treatment:    Reason Eval/Treat Not Completed: Patient not medically ready. Eval deferred due to nsg reports of work of breathing. Will try again tomorrow.    Angelina [facility] 07/04/2022, 3:42 PM Skip Mayer PT Acute [employer] [phone]

## 2022-07-04 NOTE — Inpatient Diabetes Management (Signed)
Inpatient Diabetes Program Recommendations  AACE/ADA: New Consensus Statement on Inpatient Glycemic Control (2015)  Target Ranges:  Prepandial:   less than 140 mg/dL      Peak postprandial:   less than 180 mg/dL (1-2 hours)      Critically ill patients:  140 - 180 mg/dL   Lab Results  Component Value Date   GLUCAP 59 (L) 07/04/2022   HGBA1C 7.7 (H) 07/03/2022    Latest Reference Range & Units 07/03/22 07:57 07/03/22 11:30 07/03/22 16:15 07/03/22 20:57 07/04/22 08:31 07/04/22 11:48 07/04/22 12:09  Glucose-Capillary 70 - 99 mg/dL 161 (H) Novolog 3 units 134 (H) 142 (H) Novolog 2 units 107 (H) 163 (H) Novolog 3 units 54 (L) 59 (L)  (H): Data is abnormally high (L): Data is abnormally low  Diabetes history: DM2 Outpatient Diabetes medications: Amaryl 2 mg qd Current orders for Inpatient glycemic control: Novolog 0-15 units tid, 0-5 units hs correction  Inpatient Diabetes Program Recommendations:   Please consider: -Decrease Novolog correction to 0-6 units tid, 0-5 units hs -Review Amaryl 2 mg with patient prior to D/C  Thank you, Darel Hong E. [place] Droll, RN, MSN, CDE  Diabetes Coordinator Inpatient Glycemic Control Team Team Pager [phone] (8am-5pm) 07/04/2022 12:27 PM

## 2022-07-05 DIAGNOSIS — D638 Anemia in other chronic diseases classified elsewhere: Secondary | ICD-10-CM

## 2022-07-05 DIAGNOSIS — J9601 Acute respiratory failure with hypoxia: Secondary | ICD-10-CM | POA: Diagnosis not present

## 2022-07-05 LAB — BASIC METABOLIC PANEL
Anion gap: 10 (ref 5–15)
BUN: 47 mg/dL — ABNORMAL HIGH (ref 8–23)
CO2: 26 mmol/L (ref 22–32)
Calcium: 8.7 mg/dL — ABNORMAL LOW (ref 8.9–10.3)
Chloride: 100 mmol/L (ref 98–111)
Creatinine, Ser: 2.43 mg/dL — ABNORMAL HIGH (ref 0.44–1.00)
GFR, Estimated: 19 mL/min — ABNORMAL LOW (ref >60)
Glucose, Bld: 129 mg/dL — ABNORMAL HIGH (ref 70–99)
Potassium: 3.6 mmol/L (ref 3.5–5.1)
Sodium: 136 mmol/L (ref 135–145)

## 2022-07-05 LAB — GLUCOSE, CAPILLARY
Glucose-Capillary: 116 mg/dL — ABNORMAL HIGH (ref 70–99)
Glucose-Capillary: 122 mg/dL — ABNORMAL HIGH (ref 70–99)
Glucose-Capillary: 118 mg/dL — ABNORMAL HIGH (ref 70–99)
Glucose-Capillary: 223 mg/dL — ABNORMAL HIGH (ref 70–99)

## 2022-07-05 LAB — CBC
HCT: 25 % — ABNORMAL LOW (ref 36.0–46.0)
Hemoglobin: 7.7 g/dL — ABNORMAL LOW (ref 12.0–15.0)
MCH: 25.9 pg — ABNORMAL LOW (ref 26.0–34.0)
MCHC: 30.8 g/dL (ref 30.0–36.0)
MCV: 84.2 fL (ref 80.0–100.0)
Platelets: 231 10*3/uL (ref 150–400)
RBC: 2.97 MIL/uL — ABNORMAL LOW (ref 3.87–5.11)
RDW: 16.7 % — ABNORMAL HIGH (ref 11.5–15.5)
WBC: 6.6 10*3/uL (ref 4.0–10.5)
nRBC: 0 % (ref 0.0–0.2)

## 2022-07-05 MED ORDER — POTASSIUM CHLORIDE CRYS ER 20 MEQ PO TBCR
40.0000 meq | EXTENDED_RELEASE_TABLET | Freq: Once | ORAL | Status: AC
  Administered 2022-07-05: 40 meq via ORAL
  Filled 2022-07-05: qty 2

## 2022-07-05 MED ORDER — GLUCERNA SHAKE PO LIQD
237.0000 mL | Freq: Two times a day (BID) | ORAL | Status: DC
  Administered 2022-07-05 – 2022-07-08 (×3): 237 mL via ORAL

## 2022-07-05 NOTE — Progress Notes (Signed)
   Palliative Medicine Inpatient Follow Up Note HPI: 89 y.o. female with medical history significant for CAD, hypertension, GERD, type 2 diabetes mellitus, thyroid disease, hyperlipidemia, and more presented to the ED with a chief complaint of dyspnea.  Imaging studies raise concern for CHF versus pneumonia.  She was hospitalized for further management.     Palliative care has been asked to get involved for goals of care conversations in the setting of advanced heart failure.  Today's Discussion 07/05/2022  *Please note that this is a verbal dictation therefore any spelling or grammatical errors are due to the "[employer]" system interpretation.  Chart reviewed inclusive of vital signs, progress notes, laboratory results, and diagnostic images.   I met with Nayelli and one of her granddaughters at bedside this morning. Darrion shares that she is overall feeling a great deal better. She expresses that she can breath more easily today. She shares that she is coughing secretions up though this is inhibiting her from eating. She also endorses her cough overall has eased.   Created space and opportunity for patient to explore thoughts feelings and fears regarding current medical situation.  Discussed the importance of mobility and Anaid shares a willingness to get out of bed.   We reviewed again code status. Discussed that her son will be here later today and then they will discuss goals further as a family and come to a decison.  I was able to call and speak to [facility], patients granddaughter and HCPOA and inform her of the above. I shared that a MOST form was placed at bedside for her review.   Questions and concerns addressed/Palliative Support Provided.   Objective Assessment: Vital Signs Vitals:   07/04/22 2053 07/05/22 0500  BP: 112/61 (!) 111/55  Pulse: 93 81  Resp: 15 16  Temp: 100.1 F (37.8 C) 99.2 F (37.3 C)  SpO2: 99% 99%    Intake/Output Summary (Last 24 hours) at  07/05/2022 1056 Last data filed at 07/05/2022 0500 Gross per 24 hour  Intake --  Output 2750 ml  Net -2750 ml   Last Weight  Most recent update: 07/05/2022  6:10 AM    Weight  58.2 kg (128 lb 4.8 oz)            Gen:  Elderly AA F in NAD HEENT: moist mucous membranes CV: Regular rate and rhythm  PULM:  On 5LPM HFNC ABD: soft/nontender  EXT: BLE edema  Neuro: Alert and oriented x3   SUMMARY OF RECOMMENDATIONS   Full code -->  grandaughter will discuss this with her uncle prior to initiating a DNR though she is leaning towards this. MOST form provided for review and completion   Allow time for outcomes  --> Continue present measures of care   Ongoing support  Billing based on MDM: High ______________________________________________________________________________________ Lamarr Lulas [place] Palliative Medicine Team Team Cell Phone: [phone] Please utilize secure chat with additional questions, if there is no response within 30 minutes please call the above phone number  Palliative Medicine Team providers are available by phone from 7am to 7pm daily and can be reached through the team cell phone.  Should this patient require assistance outside of these hours, please call the patient's attending physician.

## 2022-07-05 NOTE — Evaluation (Signed)
Occupational Therapy Evaluation Patient Details Name: Marie Hunt MRN: [id number] DOB: 1933/02/17 Today's Date: 07/05/2022   History of Present Illness 89 yo female currently with work up for acute respiratory failure with hypoxia, acute CHF   and elevated troponin PMH CAD HTN DM2 HLD anemia CAD diverticulosis  8L HFNC salter with bipap eye surg,L hemiarthroplasty remote smoker   Clinical Impression   Pt admitted with the above diagnoses and presents with below problem list. Pt will benefit from continued acute OT to address the below listed deficits and maximize independence with basic ADLs prior to d/c to home. At baseline, pt is mod I with ADLs. Pt currently needs supervision to contact guard assist with LB ADLs and functional transfers. Pt tolerated session well. Pt on 3L O2 throughout session with HR in the 70s, SpO2>96.        Recommendations for follow up therapy are one component of a multi-disciplinary discharge planning process, led by the attending physician.  Recommendations may be updated based on patient status, additional functional criteria and insurance authorization.   Assistance Recommended at Discharge Intermittent Supervision/Assistance  Patient can return home with the following      Functional Status Assessment  Patient has had a recent decline in their functional status and demonstrates the ability to make significant improvements in function in a reasonable and predictable amount of time.  Equipment Recommendations  None recommended by OT    Recommendations for Other Services       Precautions / Restrictions Precautions Precautions: Fall Restrictions Weight Bearing Restrictions: No      Mobility Bed Mobility Overal bed mobility: Modified Independent                  Transfers Overall transfer level: Needs assistance Equipment used: Rolling walker (2 wheels) Transfers: Sit to/from Stand Sit to Stand: Min guard                   Balance Overall balance assessment: Needs assistance Sitting-balance support: No upper extremity supported, Feet supported Sitting balance-Leahy Scale: Good     Standing balance support: Bilateral upper extremity supported, During functional activity Standing balance-Leahy Scale: Poor                             ADL either performed or assessed with clinical judgement   ADL Overall ADL's : Needs assistance/impaired Eating/Feeding: Set up   Grooming: Set up   Upper Body Bathing: Set up   Lower Body Bathing: Min guard;Sit to/from stand   Upper Body Dressing : Set up   Lower Body Dressing: Min guard;Sit to/from stand   Toilet Transfer: Min guard   Toileting- Architect and Hygiene: Min guard       Functional mobility during ADLs: Min guard;Rolling walker (2 wheels) General ADL Comments: Pt completed bed mobility, walked in the hall a bit including navigating obstacles.     Vision         Perception     Praxis      Pertinent Vitals/Pain Pain Assessment Pain Assessment: No/denies pain     Hand Dominance     Extremity/Trunk Assessment Upper Extremity Assessment Upper Extremity Assessment: Generalized weakness   Lower Extremity Assessment Lower Extremity Assessment: Defer to PT evaluation       Communication Communication Communication: HOH   Cognition Arousal/Alertness: Awake/alert Behavior During Therapy: [facility] for tasks assessed/performed  General Comments: Pt's granddaughter provided home setup after some inaccuracies noted by her during pt's recollection. STM deficits, suspect baseline. Pleasant, friendly affect.     General Comments  Grandaughter present throughout session    Exercises     Shoulder Instructions      Home Living Family/patient expects to be discharged to:: Private residence Living Arrangements: Alone Available Help at Discharge: Family Type of Home:  Apartment Home Access: Level entry     Home Layout: One level     Bathroom Shower/Tub: Producer, television/film/video: Handicapped height     Home Equipment: Agricultural consultant (2 wheels);Cane - single point;Wheelchair - manual;Grab bars - tub/shower;Shower seat          Prior Functioning/Environment Prior Level of Function : Independent/Modified Independent             Mobility Comments: cane vs w/c per grandaughter ADLs Comments: Pt enjoys helping to take care of babies. family helps with IADLs such as getting trash cans to curb.        OT Problem List: Decreased strength;Decreased activity tolerance;Impaired balance (sitting and/or standing);Decreased cognition;Decreased knowledge of use of DME or AE;Decreased knowledge of precautions;Pain      OT Treatment/Interventions: Self-care/ADL training;Energy conservation;DME and/or AE instruction;Therapeutic activities;Patient/family education;Balance training    OT Goals(Current goals can be found in the care plan section) Acute Rehab OT Goals Patient Stated Goal: not stated OT Goal Formulation: With patient/family Time For Goal Achievement: 07/19/22 Potential to Achieve Goals: Good ADL Goals Pt Will Perform Grooming: with modified independence;standing Pt Will Perform Lower Body Dressing: with modified independence;sit to/from stand Pt Will Transfer to Toilet: with modified independence;ambulating Pt Will Perform Toileting - Clothing Manipulation and hygiene: with modified independence;sit to/from stand  OT Frequency: Min 2X/week    Co-evaluation              AM-PAC OT "6 Clicks" Daily Activity     Outcome Measure Help from another person eating meals?: None Help from another person taking care of personal grooming?: None Help from another person toileting, which includes using toliet, bedpan, or urinal?: A Little Help from another person bathing (including washing, rinsing, drying)?: A Little Help from  another person to put on and taking off regular upper body clothing?: A Little Help from another person to put on and taking off regular lower body clothing?: A Little 6 Click Score: 20   End of Session Equipment Utilized During Treatment: Rolling walker (2 wheels);Oxygen (3L)  Activity Tolerance: Patient tolerated treatment well Patient left: in bed;with call bell/phone within reach;with bed alarm set;with family/visitor present  OT Visit Diagnosis: Unsteadiness on feet (R26.81);Muscle weakness (generalized) (M62.81);Pain                Time: 1610-9604 OT Time Calculation (min): 26 min Charges:  OT General Charges $OT Visit: 1 Visit OT Evaluation $OT Eval Low Complexity: 1 Low OT Treatments $Self Care/Home Management : 8-22 mins  Raynald Kemp, OT Acute Rehabilitation Services Office: [phone]   Pilar Grammes 07/05/2022, 2:36 PM

## 2022-07-05 NOTE — Progress Notes (Signed)
Rounding Note    Patient Name: Marie Hunt Date of Encounter: 07/05/2022  [facility] Cardiologist: Meriam Sprague, MD - with [facility] Cardiology  Subjective   Patient breathing better    No CP   Laying comfortably in bed   Inpatient Medications    Scheduled Meds:  aspirin EC  81 mg Oral QPM   doxycycline  100 mg Oral Q12H   famotidine  20 mg Oral QHS   heparin  5,000 Units Subcutaneous Q8H   hydrALAZINE  25 mg Oral Q8H   insulin aspart  0-9 Units Subcutaneous TID WC   isosorbide mononitrate  15 mg Oral Daily   levothyroxine  37.5 mcg Oral Q0600   melatonin  6 mg Oral QPM   simvastatin  20 mg Oral QHS   traZODone  50 mg Oral QHS   Continuous Infusions:  furosemide (LASIX) 200 mg in dextrose 5 % 100 mL (2 mg/mL) infusion 10 mg/hr (07/04/22 1610)   PRN Meds: acetaminophen **OR** acetaminophen, guaiFENesin-dextromethorphan, LORazepam, oxyCODONE, polyethylene glycol   Vital Signs    Vitals:   07/04/22 1514 07/04/22 2053 07/05/22 0500 07/05/22 0610  BP: 104/67 112/61 (!) 111/55   Pulse:  93 81   Resp:  15 16   Temp:  100.1 F (37.8 C) 99.2 F (37.3 C)   TempSrc:  Oral Oral   SpO2:  99% 99%   Weight:    58.2 kg  Height:        Intake/Output Summary (Last 24 hours) at 07/05/2022 0704 Last data filed at 07/05/2022 0500 Gross per 24 hour  Intake --  Output 2750 ml  Net -2750 ml      07/05/2022    6:10 AM 07/03/2022    6:56 AM 07/02/2022    9:36 PM  Last 3 Weights  Weight (lbs) 128 lb 4.8 oz 139 lb 5.3 oz 139 lb 5.3 oz  Weight (kg) 58.196 kg 63.2 kg 63.2 kg      Telemetry    SR   6 beat NSVT  - Personally Reviewed  ECG    Sinus rhythm with HR 8, poor R wave progression, consider wandering pacemaker given different p wave morphologies  - Personally Reviewed  Physical Exam   GEN: No acute distress.   Neck: JVP is normal  Cardiac: RRR  II/VI systolic murmur base   NO S3    Respiratory: CTA  GI: Soft, nontender, non-distended   Ext   Triv edema   Labs    High Sensitivity Troponin:   Recent Labs  Lab 07/02/22 1555 07/02/22 1853  TROPONINIHS 202* 198*     Chemistry Recent Labs  Lab 07/02/22 1555 07/02/22 1938 07/03/22 0418 07/04/22 0223 07/05/22 0212  NA 134*   < > 136 139 136  K 3.7   < > 4.4 3.9 3.6  CL 102   < > 105 102 100  CO2 22   < > 20* 23 26  GLUCOSE 211*   < > 202* 200* 129*  BUN 54*   < > 50* 48* 47*  CREATININE 2.37*   < > 2.31* 2.37* 2.43*  CALCIUM 8.9   < > 9.3 8.9 8.7*  MG  --   --  2.2  --   --   PROT 7.0  --  6.9  --   --   ALBUMIN 2.7*  --  2.6*  --   --   AST 27  --  29  --   --  ALT 20  --  21  --   --   ALKPHOS 130*  --  138*  --   --   BILITOT 0.5  --  0.5  --   --   GFRNONAA 19*   < > 20* 19* 19*  ANIONGAP 10   < > 11 14 10    < > = values in this interval not displayed.    Lipids No results for input(s): "CHOL", "TRIG", "HDL", "LABVLDL", "LDLCALC", "CHOLHDL" in the last 168 hours.  Hematology Recent Labs  Lab 07/03/22 0418 07/04/22 0223 07/05/22 0212  WBC 9.8 6.8 6.6  RBC 3.36* 2.93*  2.97* 2.97*  HGB 8.9* 7.8* 7.7*  HCT 27.7* 24.7* 25.0*  MCV 82.4 84.3 84.2  MCH 26.5 26.6 25.9*  MCHC 32.1 31.6 30.8  RDW 16.4* 16.7* 16.7*  PLT 236 204 231   Thyroid  Recent Labs  Lab 07/03/22 0418 07/04/22 0223  TSH 14.326*  --   FREET4  --  1.04    BNP Recent Labs  Lab 07/02/22 1555  BNP 1,307.0*    DDimer No results for input(s): "DDIMER" in the last 168 hours.   Radiology    VAS [place] LOWER EXTREMITY VENOUS (DVT)  Result Date: 07/03/2022  Lower Venous DVT Study Patient Name:  Marie Hunt  Date of Exam:   07/03/2022 Medical Rec #: [id number]           Accession #:    [id number] Date of Birth: Apr 27, 1932           Patient Gender: F Patient Age:   89 years Exam Location:  [facility] Procedure:      VAS [place] LOWER EXTREMITY VENOUS (DVT) Referring Phys: Osvaldo Shipper --------------------------------------------------------------------------------   Indications: Swelling, and Edema.  Comparison Study: No recent studies. Performing Technologist: Marilynne Halsted RDMS, RVT  Examination Guidelines: A complete evaluation includes B-mode imaging, spectral Doppler, color Doppler, and power Doppler as needed of all accessible portions of each vessel. Bilateral testing is considered an integral part of a complete examination. Limited examinations for reoccurring indications may be performed as noted. The reflux portion of the exam is performed with the patient in reverse Trendelenburg.  +---------+---------------+---------+-----------+----------+--------------+ RIGHT    CompressibilityPhasicitySpontaneityPropertiesThrombus Aging +---------+---------------+---------+-----------+----------+--------------+ CFV      Full           Yes      Yes                                 +---------+---------------+---------+-----------+----------+--------------+ SFJ      Full                                                        +---------+---------------+---------+-----------+----------+--------------+ FV Prox  Full                                                        +---------+---------------+---------+-----------+----------+--------------+ FV Mid   Full                                                        +---------+---------------+---------+-----------+----------+--------------+  FV DistalFull                                                        +---------+---------------+---------+-----------+----------+--------------+ PFV      Full                                                        +---------+---------------+---------+-----------+----------+--------------+ POP      Full           Yes      Yes                                 +---------+---------------+---------+-----------+----------+--------------+ PTV      Full                                                         +---------+---------------+---------+-----------+----------+--------------+ PERO     Full                                                        +---------+---------------+---------+-----------+----------+--------------+   +---------+---------------+---------+-----------+----------+--------------+ LEFT     CompressibilityPhasicitySpontaneityPropertiesThrombus Aging +---------+---------------+---------+-----------+----------+--------------+ CFV      Full           Yes      Yes                                 +---------+---------------+---------+-----------+----------+--------------+ SFJ      Full                                                        +---------+---------------+---------+-----------+----------+--------------+ FV Prox  Full                                                        +---------+---------------+---------+-----------+----------+--------------+ FV Mid   Full                                                        +---------+---------------+---------+-----------+----------+--------------+ FV DistalFull                                                        +---------+---------------+---------+-----------+----------+--------------+  PFV      Full                                                        +---------+---------------+---------+-----------+----------+--------------+ POP      Full           Yes      Yes                                 +---------+---------------+---------+-----------+----------+--------------+ PTV      Full                                                        +---------+---------------+---------+-----------+----------+--------------+ PERO     Full                                                        +---------+---------------+---------+-----------+----------+--------------+     Summary: BILATERAL: - No evidence of deep vein thrombosis seen in the lower extremities, bilaterally. -No evidence of  popliteal cyst, bilaterally.   *See table(s) above for measurements and observations. Electronically signed by Coral Else MD on 07/03/2022 at 9:49:23 PM.    Final    ECHOCARDIOGRAM COMPLETE  Result Date: 07/03/2022    ECHOCARDIOGRAM REPORT   Patient Name:   KAITLEY COSLETT Date of Exam: 07/03/2022 Medical Rec #:  [id number]          Height:       64.0 in Accession #:    [id number]         Weight:       139.3 lb Date of Birth:  [birth year]          BSA:          1.678 m Patient Age:    89 years           BP:           123/55 mmHg Patient Gender: F                  HR:           87 bpm. Exam Location:  Inpatient Procedure: 2D Echo, Cardiac Doppler and Color Doppler Indications:    CHF I50.9  History:        Patient has prior history of Echocardiogram examinations and                 Patient has no prior history of Echocardiogram examinations.                 CHF, CAD; Risk Factors:Hypertension, Diabetes and Dyslipidemia.  Sonographer:    Lucendia Herrlich Referring Phys: [id number] ASIA B ZIERLE-GHOSH  Sonographer Comments: Image acquisition challenging due to uncooperative patient. IMPRESSIONS  1. Inferior hypokinesis septal and apical akinesis . Left ventricular ejection fraction, by estimation, is 30 to 35%. The left ventricle has moderately decreased function. The left ventricle demonstrates regional wall motion abnormalities (see scoring diagram/findings  for description). The left ventricular internal cavity size was moderately dilated. There is mild asymmetric left ventricular hypertrophy of the basal and septal segments. Left ventricular diastolic parameters are consistent with Grade I  diastolic dysfunction (impaired relaxation).  2. Right ventricular systolic function is normal. The right ventricular size is normal.  3. The mitral valve is abnormal. Mild mitral valve regurgitation. No evidence of mitral stenosis. Moderate mitral annular calcification.  4. The aortic valve is normal in structure. There is  moderate calcification of the aortic valve. There is moderate thickening of the aortic valve. Aortic valve regurgitation is mild. Mild to moderate aortic valve stenosis.  5. The inferior vena cava is normal in size with greater than 50% respiratory variability, suggesting right atrial pressure of 3 mmHg. FINDINGS  Left Ventricle: Inferior hypokinesis septal and apical akinesis. Left ventricular ejection fraction, by estimation, is 30 to 35%. The left ventricle has moderately decreased function. The left ventricle demonstrates regional wall motion abnormalities. The left ventricular internal cavity size was moderately dilated. There is mild asymmetric left ventricular hypertrophy of the basal and septal segments. Left ventricular diastolic parameters are consistent with Grade I diastolic dysfunction (impaired relaxation). Right Ventricle: The right ventricular size is normal. No increase in right ventricular wall thickness. Right ventricular systolic function is normal. Left Atrium: Left atrial size was normal in size. Right Atrium: Right atrial size was normal in size. Pericardium: There is no evidence of pericardial effusion. Mitral Valve: The mitral valve is abnormal. There is mild thickening of the mitral valve leaflet(s). There is mild calcification of the mitral valve leaflet(s). Moderate mitral annular calcification. Mild mitral valve regurgitation. No evidence of mitral  valve stenosis. Tricuspid Valve: The tricuspid valve is normal in structure. Tricuspid valve regurgitation is mild . No evidence of tricuspid stenosis. Aortic Valve: The aortic valve is normal in structure. There is moderate calcification of the aortic valve. There is moderate thickening of the aortic valve. Aortic valve regurgitation is mild. Aortic regurgitation PHT measures 373 msec. Mild to moderate  aortic stenosis is present. Aortic valve mean gradient measures 13.5 mmHg. Aortic valve peak gradient measures 23.3 mmHg. Aortic valve area,  by VTI measures 1.77 cm. Pulmonic Valve: The pulmonic valve was normal in structure. Pulmonic valve regurgitation is not visualized. No evidence of pulmonic stenosis. Aorta: The aortic root is normal in size and structure. Venous: The inferior vena cava is normal in size with greater than 50% respiratory variability, suggesting right atrial pressure of 3 mmHg. IAS/Shunts: The interatrial septum was not well visualized.  LEFT VENTRICLE PLAX 2D LVIDd:         4.60 cm   Diastology LVIDs:         3.30 cm   LV e' medial:    8.38 cm/s LV PW:         0.90 cm   LV E/e' medial:  13.1 LV IVS:        1.10 cm   LV e' lateral:   11.10 cm/s LVOT diam:     2.20 cm   LV E/e' lateral: 9.9 LV SV:         85 LV SV Index:   51 LVOT Area:     3.80 cm  RIGHT VENTRICLE RV S prime:     12.30 cm/s TAPSE (M-mode): 2.0 cm LEFT ATRIUM             Index        RIGHT ATRIUM  Index LA diam:        3.60 cm 2.15 cm/m   RA Area:     13.35 cm LA Vol (A2C):   80.1 ml 47.74 ml/m  RA Volume:   31.85 ml  18.98 ml/m LA Vol (A4C):   84.8 ml 50.54 ml/m LA Biplane Vol: 90.0 ml 53.64 ml/m  AORTIC VALVE AV Area (Vmax):    1.85 cm AV Area (Vmean):   1.70 cm AV Area (VTI):     1.77 cm AV Vmax:           241.50 cm/s AV Vmean:          175.000 cm/s AV VTI:            0.482 m AV Peak Grad:      23.3 mmHg AV Mean Grad:      13.5 mmHg LVOT Vmax:         117.50 cm/s LVOT Vmean:        78.400 cm/s LVOT VTI:          0.224 m LVOT/AV VTI ratio: 0.47 AI PHT:            373 msec  AORTA Ao Root diam: 3.10 cm Ao Asc diam:  3.30 cm MITRAL VALVE                TRICUSPID VALVE MV Area (PHT): 4.80 cm     TR Peak grad:   46.8 mmHg MV Decel Time: 158 msec     TR Vmax:        342.00 cm/s MR Peak grad: 74.3 mmHg MR Vmax:      431.00 cm/s   SHUNTS MV E velocity: 110.00 cm/s  Systemic VTI:  0.22 m MV A velocity: 83.90 cm/s   Systemic Diam: 2.20 cm MV E/A ratio:  1.31 Charlton Haws MD Electronically signed by Charlton Haws MD Signature Date/Time: 07/03/2022/3:17:50 PM     Final    DG CHEST PORT 1 VIEW  Result Date: 07/03/2022 CLINICAL DATA:  Shortness of breath EXAM: PORTABLE CHEST 1 VIEW COMPARISON:  CXR 07/02/22 FINDINGS: Trace left pleural effusion. No pneumothorax. Unchanged cardiac and mediastinal contours. There are multifocal patchy airspace opacities in the right upper and lower lung fields, as well as the left upper lung field. No radiographically apparent displaced rib fractures. Visualized upper abdomen is unremarkable. IMPRESSION: Multifocal patchy airspace opacities in the right upper and lower lung fields, as well as the left upper lung field. FIndings are worrisome for infection. Electronically Signed   By: Lorenza Cambridge M.D.   On: 07/03/2022 09:07    Cardiac Studies   Echo 07/03/22:  1. Inferior hypokinesis septal and apical akinesis . Left ventricular  ejection fraction, by estimation, is 30 to 35%. The left ventricle has  moderately decreased function. The left ventricle demonstrates regional  wall motion abnormalities (see scoring  diagram/findings for description). The left ventricular internal cavity  size was moderately dilated. There is mild asymmetric left ventricular  hypertrophy of the basal and septal segments. Left ventricular diastolic  parameters are consistent with Grade I   diastolic dysfunction (impaired relaxation).   2. Right ventricular systolic function is normal. The right ventricular  size is normal.   3. The mitral valve is abnormal. Mild mitral valve regurgitation. No  evidence of mitral stenosis. Moderate mitral annular calcification.   4. The aortic valve is normal in structure. There is moderate  calcification of the aortic valve. There is moderate thickening of the  aortic valve. Aortic valve regurgitation is mild. Mild to moderate aortic  valve stenosis.   5. The inferior vena cava is normal in size with greater than 50%  respiratory variability, suggesting right atrial pressure of 3 mmHg.    LE [place]  07/03/22: BILATERAL:  - No evidence of deep vein thrombosis seen in the lower extremities,  bilaterally.  -No evidence of popliteal cyst, bilaterally.      Patient Profile     89 y.o. female with a PMH significant for CAS s/p left CEA 2012, HTN, HLD, CKD IV, DM2, and hypothyroidism who presented with acute heart failure - echo showing LVEF 30-35% with WMA.   Assessment & Plan    1  HFrEF  - BNP 1307 - LVEF 30-35% with WMA, G1DD  The pt responded well to IV lasix gtt.   This has been stopped   Sl bump in Cr     Follow renal function in am before considering redosing     Would recomm PO at that time Continue hydralazine and imdur   Hypertension BP controlled  Keep on same regimen   AS   Mild to moderate on echo     Elevated trop Most likely reflects demand in setting of CHF, renal dysfunciton I would not recomm ischemic evaluation     Anemia Hgb 7.7   Follow closely   IF drops further consider tx 1 U prbc   Prolonged QTC QTCb 464 today Avoid prolonging agents      For questions or updates, please contact [facility] Please consult [email or web address] for contact info under        Signed, Dietrich Pates, MD  07/05/2022, 7:04 AM

## 2022-07-05 NOTE — Evaluation (Signed)
Physical Therapy Evaluation Patient Details Name: Marie Hunt MRN: [id number] DOB: Jul 20, 1932 Today's Date: 07/05/2022  History of Present Illness  89 yo female presented 07/02/22 with work up for acute respiratory failure with hypoxia, acute CHF and elevated troponin. PMH: CAD HTN DM2 HLD anemia CAD diverticulosis  8L HFNC salter with bipap eye surg,L hemiarthroplasty remote smoker   Clinical Impression  Pt presents with condition above and deficits mentioned below, see PT Problem List. PTA, she was mod I using a SPC vs w/c for mobility, living alone in a 1-level apartment with a level entry. Currently, pt demonstrates deficits in gross strength, balance, and activity tolerance, needing extra time and rest breaks with functional mobility. Pt was able to perform bed mobility, transfers, and ambulate with a RW without physical assistance at a slow pace today. Pt will likely progress well with continued mobility. Recommending follow-up with HHPT. Will continue to follow acutely. Pt's SpO2 remained >/= 90% on RA throughout this session.      Recommendations for follow up therapy are one component of a multi-disciplinary discharge planning process, led by the attending physician.  Recommendations may be updated based on patient status, additional functional criteria and insurance authorization.  Follow Up Recommendations       Assistance Recommended at Discharge Intermittent Supervision/Assistance  Patient can return home with the following  A little help with walking and/or transfers;A little help with bathing/dressing/bathroom;Assistance with cooking/housework;Help with stairs or ramp for entrance;Assist for transportation    Equipment Recommendations None recommended by PT  Recommendations for Other Services       Functional Status Assessment Patient has had a recent decline in their functional status and demonstrates the ability to make significant improvements in function in a  reasonable and predictable amount of time.     Precautions / Restrictions Precautions Precautions: Fall Precaution Comments: watch SpO2 Restrictions Weight Bearing Restrictions: No      Mobility  Bed Mobility Overal bed mobility: Modified Independent             General bed mobility comments: Extra time and use of bed features to transition supine <> sit R EOB without assistance    Transfers Overall transfer level: Needs assistance Equipment used: Rolling walker (2 wheels) Transfers: Sit to/from Stand Sit to Stand: Min guard           General transfer comment: Min guard assist for safety, no LOB    Ambulation/Gait Ambulation/Gait assistance: Min guard Gait Distance (Feet): 90 Feet Assistive device: Rolling walker (2 wheels) Gait Pattern/deviations: Step-through pattern, Decreased stride length, Trunk flexed Gait velocity: reduced Gait velocity interpretation: <1.31 ft/sec, indicative of household ambulator   General Gait Details: Pt maintains a kyphotic posture and inferior gaze. Provided cues to look up and stand upright with momentary success noted. Pt takes slow, small steps and needs intermittent cues to remain proximal within the RW. No LOB, minguard for safety, x2 standing rest breaks.  Stairs            Wheelchair Mobility    Modified Rankin (Stroke Patients Only)       Balance Overall balance assessment: Needs assistance Sitting-balance support: No upper extremity supported, Feet supported Sitting balance-Leahy Scale: Good     Standing balance support: Bilateral upper extremity supported, During functional activity, No upper extremity supported Standing balance-Leahy Scale: Fair Standing balance comment: Able to stand statically without UE support, benefits from RW to ambulate  Pertinent Vitals/Pain Pain Assessment Pain Assessment: No/denies pain    Home Living Family/patient expects to be  discharged to:: Private residence Living Arrangements: Alone Available Help at Discharge: Family Type of Home: Apartment Home Access: Level entry       Home Layout: One level Home Equipment: Agricultural consultant (2 wheels);Cane - single point;Wheelchair - manual;Grab bars - tub/shower;Shower seat Additional Comments: not on O2 at home    Prior Function Prior Level of Function : Independent/Modified Independent             Mobility Comments: cane vs w/c per grandaughter ADLs Comments: Pt enjoys helping to take care of babies. family helps with IADLs such as getting trash cans to curb.     Hand Dominance        Extremity/Trunk Assessment   Upper Extremity Assessment Upper Extremity Assessment: Defer to OT evaluation    Lower Extremity Assessment Lower Extremity Assessment: Generalized weakness    Cervical / Trunk Assessment Cervical / Trunk Assessment: Kyphotic  Communication   Communication: HOH  Cognition Arousal/Alertness: Awake/alert Behavior During Therapy: WFL for tasks assessed/performed                                   General Comments: Per OT - STM deficits, suspect baseline. Pleasant, friendly affect.        General Comments General comments (skin integrity, edema, etc.): SpO2 >/= 90% on RA throughout    Exercises     Assessment/Plan    PT Assessment Patient needs continued PT services  PT Problem List Decreased strength;Decreased activity tolerance;Decreased mobility;Decreased balance;Cardiopulmonary status limiting activity       PT Treatment Interventions DME instruction;Gait training;Functional mobility training;Therapeutic activities;Therapeutic exercise;Balance training;Neuromuscular re-education;Cognitive remediation;Patient/family education    PT Goals (Current goals can be found in the Care Plan section)  Acute Rehab PT Goals Patient Stated Goal: to improve PT Goal Formulation: With patient/family Time For Goal  Achievement: 07/19/22 Potential to Achieve Goals: Good    Frequency Min 1X/week     Co-evaluation               AM-PAC PT "6 Clicks" Mobility  Outcome Measure Help needed turning from your back to your side while in a flat bed without using bedrails?: None Help needed moving from lying on your back to sitting on the side of a flat bed without using bedrails?: None Help needed moving to and from a bed to a chair (including a wheelchair)?: A Little Help needed standing up from a chair using your arms (e.g., wheelchair or bedside chair)?: A Little Help needed to walk in hospital room?: A Little Help needed climbing 3-5 steps with a railing? : A Little 6 Click Score: 20    End of Session Equipment Utilized During Treatment: Gait belt Activity Tolerance: Patient tolerated treatment well Patient left: in bed;with call bell/phone within reach;with bed alarm set;with family/visitor present Nurse Communication: Mobility status;Other (comment) (sats;  doffed and left near pt if needed end of session) PT Visit Diagnosis: Unsteadiness on feet (R26.81);Other abnormalities of gait and mobility (R26.89);Muscle weakness (generalized) (M62.81);Difficulty in walking, not elsewhere classified (R26.2)    Time: 4098-1191 PT Time Calculation (min) (ACUTE ONLY): 17 min   Charges:   PT Evaluation $PT Eval Moderate Complexity: 1 Mod          Raymond Gurney, PT, DPT Acute Rehabilitation Services  Office: [phone]   Jewel Baize 07/05/2022, 5:06  PM

## 2022-07-05 NOTE — Progress Notes (Addendum)
[facility] PROGRESS NOTE   Marie Hunt ZHY:[id number] DOB: Jul 10, 1932 DOA: 07/02/2022  PCP: Kirstie Peri, MD  Brief History/Interval Summary: 89 y.o. female with medical history significant for CAD, hypertension, GERD, type 2 diabetes mellitus, thyroid disease, hyperlipidemia, and more presented to the ED with a chief complaint of dyspnea.  Imaging studies raise concern for CHF versus pneumonia.  She was hospitalized for further management.    Consultants: Cardiology  Procedures: Transthoracic echocardiogram    Subjective/Interval History: Patient mentioned that she is feeling better.  Shortness of breath is improved.  Denies any chest pain.  No nausea vomiting.  Granddaughter is at the bedside.     Assessment/Plan:  Acute respiratory failure with hypoxia Does not typically use oxygen at home.  Respiratory failure is thought to be due to pulmonary edema/CHF. She was initially requiring 12 L of oxygen by high flow nasal cannula.  She has diuresed well over the last 48 hours.  Oxygen requirements have decreased significantly.  Currently on 3 L of oxygen by nasal cannula. Start mobilizing. Initially placed on empiric antibiotics due to concern for pneumonia.  Procalcitonin 0.32.  COVID-19 PCR was negative.  Changed over to doxycycline due to prolonged QT.  5-day course should suffice.  Acute systolic CHF Echocardiogram shows EF 30 to 35% with regional wall motion abnormalities.  Grade 1 diastolic dysfunction was also noted.  Moderate aortic stenosis also appreciated. Cardiology is following.  Medical management is being pursued considering her advanced age and comorbidities.  Cannot use ACE inhibitor ARB due to chronic kidney disease. Patient is currently on furosemide infusion.  Seems to be diuresing well. Continue strict ins and outs and daily weight. Patient noted to be on nitrates and hydralazine. Renal function seems to be holding steady. Lower extremity Doppler  studies were negative for DVT. Mildly elevated troponin likely suggestive of demand ischemia rather than an acute coronary syndrome.   Anticipate transition to bolus dosing of Lasix and eventually to oral Lasix in the next few days.  Chronic kidney disease stage 4 Baseline creatinine seems to be between 2.0-2.5.  Monitor closely while she is getting diuresed. Monitor urine output. Renal function holding steady.  Prolonged QT interval Monitor on telemetry.  Hypothyroidism Levothyroxine being continued.  TSH noted to be quite elevated at 14.32.  Free T4 is noted to be 1.04.  Dose of levothyroxine was increased.  Would recommend thyroid function test be rechecked in 3 to 4 weeks.  Diabetes mellitus type 2, uncontrolled with hyperglycemia HbA1c 7.7.  Continue SSI.  Holding glimepiride.  CBGs are reasonably well-controlled.  Anemia of chronic kidney disease Lower than her usual baseline.  Last year her hemoglobin was 11.5.   Anemia panel reviewed.  Ferritin 430, iron 12, TIBC 196, percent saturation 6.  B12 2753 and folate greater than 40.  Drop in hemoglobin is likely due to fluid shift.  No overt bleeding noted.  Continue to monitor. Transfuse if it drops below 7.  Hyperlipidemia Continue statin.  Essential hypertension Monitor blood pressures.  Cardiology has made changes to antihypertensives.  Amlodipine and metoprolol were discontinued.  Currently on hydralazine and nitrates.   GERD (gastroesophageal reflux disease) Continue Pepcid   Goals of care Palliative Care is following.  Currently full code.  DVT Prophylaxis: Subcutaneous heparin Code Status: Full code Family Communication: Discussed with granddaughter and patient Disposition Plan: To be determined.  Await PT and OT evaluation.    Medications: Scheduled:  aspirin EC  81 mg Oral QPM  doxycycline  100 mg Oral Q12H   famotidine  20 mg Oral QHS   heparin  5,000 Units Subcutaneous Q8H   hydrALAZINE  25 mg Oral Q8H    insulin aspart  0-9 Units Subcutaneous TID WC   isosorbide mononitrate  15 mg Oral Daily   levothyroxine  37.5 mcg Oral Q0600   melatonin  6 mg Oral QPM   simvastatin  20 mg Oral QHS   traZODone  50 mg Oral QHS   Continuous:  furosemide (LASIX) 200 mg in dextrose 5 % 100 mL (2 mg/mL) infusion 10 mg/hr (07/04/22 7829)   FAO:ZHYQMVHQIONGE **OR** acetaminophen, guaiFENesin-dextromethorphan, LORazepam, oxyCODONE, polyethylene glycol  Antibiotics: Anti-infectives (From admission, onward)    Start     Dose/Rate Route Frequency Ordered Stop   07/04/22 1030  doxycycline (VIBRA-TABS) tablet 100 mg        100 mg Oral Every 12 hours 07/04/22 0944 07/08/22 0959   07/03/22 1800  cefTRIAXone (ROCEPHIN) 1 g in sodium chloride 0.9 % 100 mL IVPB  Status:  Discontinued        1 g 200 mL/hr over 30 Minutes Intravenous Every 24 hours 07/02/22 2139 07/04/22 0944   07/03/22 1800  azithromycin (ZITHROMAX) 500 mg in sodium chloride 0.9 % 250 mL IVPB  Status:  Discontinued        500 mg 250 mL/hr over 60 Minutes Intravenous Every 24 hours 07/02/22 2139 07/04/22 0944   07/02/22 1745  cefTRIAXone (ROCEPHIN) 1 g in sodium chloride 0.9 % 100 mL IVPB        1 g 200 mL/hr over 30 Minutes Intravenous  Once 07/02/22 1734 07/02/22 1920   07/02/22 1745  azithromycin (ZITHROMAX) 500 mg in sodium chloride 0.9 % 250 mL IVPB        500 mg 250 mL/hr over 60 Minutes Intravenous  Once 07/02/22 1734 07/02/22 2057       Objective:  Vital Signs  Vitals:   07/04/22 1514 07/04/22 2053 07/05/22 0500 07/05/22 0610  BP: 104/67 112/61 (!) 111/55   Pulse:  93 81   Resp:  15 16   Temp:  100.1 F (37.8 C) 99.2 F (37.3 C)   TempSrc:  Oral Oral   SpO2:  99% 99%   Weight:    58.2 kg  Height:        Intake/Output Summary (Last 24 hours) at 07/05/2022 0848 Last data filed at 07/05/2022 0500 Gross per 24 hour  Intake --  Output 2750 ml  Net -2750 ml    Filed Weights   07/02/22 2136 07/03/22 0656 07/05/22 0610   Weight: 63.2 kg 63.2 kg 58.2 kg    General appearance: Awake alert.  In no distress Resp: Crackles bilaterally.  No wheezing or rhonchi.  Improved effort. Cardio: S1-S2 is normal regular.  No S3-S4.  No rubs murmurs or bruit GI: Abdomen is soft.  Nontender nondistended.  Bowel sounds are present normal.  No masses organomegaly Extremities: No edema.  Full range of motion of lower extremities. Neurologic: No focal neurological deficits.   Lab Results:  Data Reviewed: I have personally reviewed following labs and reports of the imaging studies  CBC: Recent Labs  Lab 07/02/22 1555 07/03/22 0418 07/04/22 0223 07/05/22 0212  WBC 7.5 9.8 6.8 6.6  NEUTROABS 5.1 7.3  --   --   HGB 8.4* 8.9* 7.8* 7.7*  HCT 27.2* 27.7* 24.7* 25.0*  MCV 86.1 82.4 84.3 84.2  PLT 195 236 204 231     Basic  Metabolic Panel: Recent Labs  Lab 07/02/22 1555 07/02/22 1938 07/03/22 0418 07/04/22 0223 07/05/22 0212  NA 134* 136 136 139 136  K 3.7 3.7 4.4 3.9 3.6  CL 102 103 105 102 100  CO2 22 22 20* 23 26  GLUCOSE 211* 183* 202* 200* 129*  BUN 54* 55* 50* 48* 47*  CREATININE 2.37* 2.40* 2.31* 2.37* 2.43*  CALCIUM 8.9 9.1 9.3 8.9 8.7*  MG  --   --  2.2  --   --      GFR: Estimated Creatinine Clearance: 14.1 mL/min (A) (by C-G formula based on SCr of 2.43 mg/dL (H)).  Liver Function Tests: Recent Labs  Lab 07/02/22 1555 07/03/22 0418  AST 27 29  ALT 20 21  ALKPHOS 130* 138*  BILITOT 0.5 0.5  PROT 7.0 6.9  ALBUMIN 2.7* 2.6*      HbA1C: Recent Labs    07/03/22 0418  HGBA1C 7.7*     CBG: Recent Labs  Lab 07/04/22 1235 07/04/22 1323 07/04/22 1649 07/04/22 2221 07/05/22 0757  GLUCAP 64* 105* 164* 175* 116*       Thyroid Function Tests: Recent Labs    07/03/22 0418 07/04/22 0223  TSH 14.326*  --   FREET4  --  1.04      Recent Results (from the past 240 hour(s))  SARS Coronavirus 2 by RT PCR (hospital order, performed in [facility] hospital lab) *cepheid  single result test* Anterior Nasal Swab     Status: None   Collection Time: 07/02/22  5:45 PM   Specimen: Anterior Nasal Swab  Result Value Ref Range Status   SARS Coronavirus 2 by RT PCR NEGATIVE NEGATIVE Final    Comment: (NOTE) SARS-CoV-2 target nucleic acids are NOT DETECTED.  The SARS-CoV-2 RNA is generally detectable in upper and lower respiratory specimens during the acute phase of infection. The lowest concentration of SARS-CoV-2 viral copies this assay can detect is 250 copies / mL. A negative result does not preclude SARS-CoV-2 infection and should not be used as the sole basis for treatment or other patient management decisions.  A negative result may occur with improper specimen collection / handling, submission of specimen other than nasopharyngeal swab, presence of viral mutation(s) within the areas targeted by this assay, and inadequate number of viral copies (<250 copies / mL). A negative result must be combined with clinical observations, patient history, and epidemiological information.  Fact Sheet for Patients:   RoadLapTop.co.za  Fact Sheet for Healthcare Providers: http://kim-miller.com/  This test is not yet approved or  cleared by the [place] FDA and has been authorized for detection and/or diagnosis of SARS-CoV-2 by FDA under an Emergency Use Authorization (EUA).  This EUA will remain in effect (meaning this test can be used) for the duration of the COVID-19 declaration under Section 564(b)(1) of the Act, 21 U.S.C. section 360bbb-3(b)(1), unless the authorization is terminated or revoked sooner.  Performed at [facility], [address] [place], [place] [address]       Radiology Studies: VAS [place] LOWER EXTREMITY VENOUS (DVT)  Result Date: 07/03/2022  Lower Venous DVT Study Patient Name:  KELYN HOCHMUTH  Date of Exam:   07/03/2022 Medical Rec #: [id number]           Accession #:    [id number] Date of  Birth: [birth year]           Patient Gender: F Patient Age:   60 years Exam Location:  [facility] Procedure:  VAS [place] LOWER EXTREMITY VENOUS (DVT) Referring Phys: Osvaldo Shipper --------------------------------------------------------------------------------  Indications: Swelling, and Edema.  Comparison Study: No recent studies. Performing Technologist: Marilynne Halsted RDMS, RVT  Examination Guidelines: A complete evaluation includes B-mode imaging, spectral Doppler, color Doppler, and power Doppler as needed of all accessible portions of each vessel. Bilateral testing is considered an integral part of a complete examination. Limited examinations for reoccurring indications may be performed as noted. The reflux portion of the exam is performed with the patient in reverse Trendelenburg.  +---------+---------------+---------+-----------+----------+--------------+ RIGHT    CompressibilityPhasicitySpontaneityPropertiesThrombus Aging +---------+---------------+---------+-----------+----------+--------------+ CFV      Full           Yes      Yes                                 +---------+---------------+---------+-----------+----------+--------------+ SFJ      Full                                                        +---------+---------------+---------+-----------+----------+--------------+ FV Prox  Full                                                        +---------+---------------+---------+-----------+----------+--------------+ FV Mid   Full                                                        +---------+---------------+---------+-----------+----------+--------------+ FV DistalFull                                                        +---------+---------------+---------+-----------+----------+--------------+ PFV      Full                                                        +---------+---------------+---------+-----------+----------+--------------+  POP      Full           Yes      Yes                                 +---------+---------------+---------+-----------+----------+--------------+ PTV      Full                                                        +---------+---------------+---------+-----------+----------+--------------+ PERO     Full                                                        +---------+---------------+---------+-----------+----------+--------------+   +---------+---------------+---------+-----------+----------+--------------+  LEFT     CompressibilityPhasicitySpontaneityPropertiesThrombus Aging +---------+---------------+---------+-----------+----------+--------------+ CFV      Full           Yes      Yes                                 +---------+---------------+---------+-----------+----------+--------------+ SFJ      Full                                                        +---------+---------------+---------+-----------+----------+--------------+ FV Prox  Full                                                        +---------+---------------+---------+-----------+----------+--------------+ FV Mid   Full                                                        +---------+---------------+---------+-----------+----------+--------------+ FV DistalFull                                                        +---------+---------------+---------+-----------+----------+--------------+ PFV      Full                                                        +---------+---------------+---------+-----------+----------+--------------+ POP      Full           Yes      Yes                                 +---------+---------------+---------+-----------+----------+--------------+ PTV      Full                                                        +---------+---------------+---------+-----------+----------+--------------+ PERO     Full                                                         +---------+---------------+---------+-----------+----------+--------------+     Summary: BILATERAL: - No evidence of deep vein thrombosis seen in the lower extremities, bilaterally. -No evidence of popliteal cyst, bilaterally.   *See table(s) above for measurements and observations. Electronically signed by Coral Else MD on 07/03/2022 at 9:49:23 PM.  Final    ECHOCARDIOGRAM COMPLETE  Result Date: 07/03/2022    ECHOCARDIOGRAM REPORT   Patient Name:   CORALIE NITTI Date of Exam: 07/03/2022 Medical Rec #:  [id number]          Height:       64.0 in Accession #:    [id number]         Weight:       139.3 lb Date of Birth:  31-Aug-1932          BSA:          1.678 m Patient Age:    89 years           BP:           123/55 mmHg Patient Gender: F                  HR:           87 bpm. Exam Location:  Inpatient Procedure: 2D Echo, Cardiac Doppler and Color Doppler Indications:    CHF I50.9  History:        Patient has prior history of Echocardiogram examinations and                 Patient has no prior history of Echocardiogram examinations.                 CHF, CAD; Risk Factors:Hypertension, Diabetes and Dyslipidemia.  Sonographer:    Lucendia Herrlich Referring Phys: [id number] ASIA B ZIERLE-GHOSH  Sonographer Comments: Image acquisition challenging due to uncooperative patient. IMPRESSIONS  1. Inferior hypokinesis septal and apical akinesis . Left ventricular ejection fraction, by estimation, is 30 to 35%. The left ventricle has moderately decreased function. The left ventricle demonstrates regional wall motion abnormalities (see scoring diagram/findings for description). The left ventricular internal cavity size was moderately dilated. There is mild asymmetric left ventricular hypertrophy of the basal and septal segments. Left ventricular diastolic parameters are consistent with Grade I  diastolic dysfunction (impaired relaxation).  2. Right ventricular systolic function is normal. The right  ventricular size is normal.  3. The mitral valve is abnormal. Mild mitral valve regurgitation. No evidence of mitral stenosis. Moderate mitral annular calcification.  4. The aortic valve is normal in structure. There is moderate calcification of the aortic valve. There is moderate thickening of the aortic valve. Aortic valve regurgitation is mild. Mild to moderate aortic valve stenosis.  5. The inferior vena cava is normal in size with greater than 50% respiratory variability, suggesting right atrial pressure of 3 mmHg. FINDINGS  Left Ventricle: Inferior hypokinesis septal and apical akinesis. Left ventricular ejection fraction, by estimation, is 30 to 35%. The left ventricle has moderately decreased function. The left ventricle demonstrates regional wall motion abnormalities. The left ventricular internal cavity size was moderately dilated. There is mild asymmetric left ventricular hypertrophy of the basal and septal segments. Left ventricular diastolic parameters are consistent with Grade I diastolic dysfunction (impaired relaxation). Right Ventricle: The right ventricular size is normal. No increase in right ventricular wall thickness. Right ventricular systolic function is normal. Left Atrium: Left atrial size was normal in size. Right Atrium: Right atrial size was normal in size. Pericardium: There is no evidence of pericardial effusion. Mitral Valve: The mitral valve is abnormal. There is mild thickening of the mitral valve leaflet(s). There is mild calcification of the mitral valve leaflet(s). Moderate mitral annular calcification. Mild mitral valve regurgitation. No evidence of mitral  valve stenosis. Tricuspid Valve: The tricuspid valve  is normal in structure. Tricuspid valve regurgitation is mild . No evidence of tricuspid stenosis. Aortic Valve: The aortic valve is normal in structure. There is moderate calcification of the aortic valve. There is moderate thickening of the aortic valve. Aortic valve  regurgitation is mild. Aortic regurgitation PHT measures 373 msec. Mild to moderate  aortic stenosis is present. Aortic valve mean gradient measures 13.5 mmHg. Aortic valve peak gradient measures 23.3 mmHg. Aortic valve area, by VTI measures 1.77 cm. Pulmonic Valve: The pulmonic valve was normal in structure. Pulmonic valve regurgitation is not visualized. No evidence of pulmonic stenosis. Aorta: The aortic root is normal in size and structure. Venous: The inferior vena cava is normal in size with greater than 50% respiratory variability, suggesting right atrial pressure of 3 mmHg. IAS/Shunts: The interatrial septum was not well visualized.  LEFT VENTRICLE PLAX 2D LVIDd:         4.60 cm   Diastology LVIDs:         3.30 cm   LV e' medial:    8.38 cm/s LV PW:         0.90 cm   LV E/e' medial:  13.1 LV IVS:        1.10 cm   LV e' lateral:   11.10 cm/s LVOT diam:     2.20 cm   LV E/e' lateral: 9.9 LV SV:         85 LV SV Index:   51 LVOT Area:     3.80 cm  RIGHT VENTRICLE RV S prime:     12.30 cm/s TAPSE (M-mode): 2.0 cm LEFT ATRIUM             Index        RIGHT ATRIUM           Index LA diam:        3.60 cm 2.15 cm/m   RA Area:     13.35 cm LA Vol (A2C):   80.1 ml 47.74 ml/m  RA Volume:   31.85 ml  18.98 ml/m LA Vol (A4C):   84.8 ml 50.54 ml/m LA Biplane Vol: 90.0 ml 53.64 ml/m  AORTIC VALVE AV Area (Vmax):    1.85 cm AV Area (Vmean):   1.70 cm AV Area (VTI):     1.77 cm AV Vmax:           241.50 cm/s AV Vmean:          175.000 cm/s AV VTI:            0.482 m AV Peak Grad:      23.3 mmHg AV Mean Grad:      13.5 mmHg LVOT Vmax:         117.50 cm/s LVOT Vmean:        78.400 cm/s LVOT VTI:          0.224 m LVOT/AV VTI ratio: 0.47 AI PHT:            373 msec  AORTA Ao Root diam: 3.10 cm Ao Asc diam:  3.30 cm MITRAL VALVE                TRICUSPID VALVE MV Area (PHT): 4.80 cm     TR Peak grad:   46.8 mmHg MV Decel Time: 158 msec     TR Vmax:        342.00 cm/s MR Peak grad: 74.3 mmHg MR Vmax:      431.00 cm/s    SHUNTS MV  E velocity: 110.00 cm/s  Systemic VTI:  0.22 m MV A velocity: 83.90 cm/s   Systemic Diam: 2.20 cm MV E/A ratio:  1.31 Charlton Haws MD Electronically signed by Charlton Haws MD Signature Date/Time: 07/03/2022/3:17:50 PM    Final    DG CHEST PORT 1 VIEW  Result Date: 07/03/2022 CLINICAL DATA:  Shortness of breath EXAM: PORTABLE CHEST 1 VIEW COMPARISON:  CXR 07/02/22 FINDINGS: Trace left pleural effusion. No pneumothorax. Unchanged cardiac and mediastinal contours. There are multifocal patchy airspace opacities in the right upper and lower lung fields, as well as the left upper lung field. No radiographically apparent displaced rib fractures. Visualized upper abdomen is unremarkable. IMPRESSION: Multifocal patchy airspace opacities in the right upper and lower lung fields, as well as the left upper lung field. FIndings are worrisome for infection. Electronically Signed   By: Lorenza Cambridge M.D.   On: 07/03/2022 09:07       LOS: 2 days   Kaydra Borgen Rito Ehrlich  [facility] Pager on [email or web address]  07/05/2022, 8:48 AM

## 2022-07-06 ENCOUNTER — Other Ambulatory Visit: Payer: Self-pay

## 2022-07-06 DIAGNOSIS — J9601 Acute respiratory failure with hypoxia: Secondary | ICD-10-CM | POA: Diagnosis not present

## 2022-07-06 LAB — BASIC METABOLIC PANEL
Anion gap: 10 (ref 5–15)
BUN: 45 mg/dL — ABNORMAL HIGH (ref 8–23)
CO2: 26 mmol/L (ref 22–32)
Calcium: 8.9 mg/dL (ref 8.9–10.3)
Chloride: 100 mmol/L (ref 98–111)
Creatinine, Ser: 2.6 mg/dL — ABNORMAL HIGH (ref 0.44–1.00)
GFR, Estimated: 17 mL/min — ABNORMAL LOW (ref >60)
Glucose, Bld: 164 mg/dL — ABNORMAL HIGH (ref 70–99)
Potassium: 3.9 mmol/L (ref 3.5–5.1)
Sodium: 136 mmol/L (ref 135–145)

## 2022-07-06 LAB — CBC
HCT: 26.8 % — ABNORMAL LOW (ref 36.0–46.0)
Hemoglobin: 8.2 g/dL — ABNORMAL LOW (ref 12.0–15.0)
MCH: 25.9 pg — ABNORMAL LOW (ref 26.0–34.0)
MCHC: 30.6 g/dL (ref 30.0–36.0)
MCV: 84.8 fL (ref 80.0–100.0)
Platelets: 263 10*3/uL (ref 150–400)
RBC: 3.16 MIL/uL — ABNORMAL LOW (ref 3.87–5.11)
RDW: 16.8 % — ABNORMAL HIGH (ref 11.5–15.5)
WBC: 6.7 10*3/uL (ref 4.0–10.5)
nRBC: 0 % (ref 0.0–0.2)

## 2022-07-06 LAB — GLUCOSE, CAPILLARY
Glucose-Capillary: 126 mg/dL — ABNORMAL HIGH (ref 70–99)
Glucose-Capillary: 175 mg/dL — ABNORMAL HIGH (ref 70–99)
Glucose-Capillary: 191 mg/dL — ABNORMAL HIGH (ref 70–99)
Glucose-Capillary: 187 mg/dL — ABNORMAL HIGH (ref 70–99)

## 2022-07-06 NOTE — Progress Notes (Signed)
   Palliative Medicine Inpatient Follow Up Note HPI: 89 y.o. female with medical history significant for CAD, hypertension, GERD, type 2 diabetes mellitus, thyroid disease, hyperlipidemia, and more presented to the ED with a chief complaint of dyspnea.  Imaging studies raise concern for CHF versus pneumonia.  She was hospitalized for further management.     Palliative care has been asked to get involved for goals of care conversations in the setting of advanced heart failure.  Today's Discussion 07/06/2022  *Please note that this is a verbal dictation therefore any spelling or grammatical errors are due to the "Dragon Medical One" system interpretation.  Chart reviewed inclusive of vital signs, progress notes, laboratory results, and diagnostic images.   I met with Moneisha, her granddaughter, son and DIL. Knya was sitting up in the recliner on RA. She was asking about pie crusts. We discussed that she is feeling very well and the hope for discharge home tomorrow.  Reviewed MOST form with granddaughter is willing to complete tomorrow with the PMT.   Questions and concerns addressed/Palliative Support Provided.   Objective Assessment: Vital Signs Vitals:   07/05/22 2024 07/06/22 0614  BP: 115/68 122/81  Pulse: 94 90  Resp: 18 16  Temp: 98.5 F (36.9 C) 98.3 F (36.8 C)  SpO2: 94% 97%    Intake/Output Summary (Last 24 hours) at 07/06/2022 1731 Last data filed at 07/06/2022 0730 Gross per 24 hour  Intake --  Output 1150 ml  Net -1150 ml    Last Weight  Most recent update: 07/06/2022  6:24 AM    Weight  58.3 kg (128 lb 8.5 oz)            Gen:  Elderly AA F in NAD HEENT: moist mucous membranes CV: Regular rate and rhythm  PULM:  On 5LPM HFNC ABD: soft/nontender  EXT: BLE edema  Neuro: Alert and oriented x3   SUMMARY OF RECOMMENDATIONS   Full code -->  grandaughter agreeable to review and complete MOST tomorrow   Allow time for outcomes  --> Continue present measures of  care, improving   Ongoing support  Billing based on MDM: Moderate ______________________________________________________________________________________ Lamarr Lulas [place] Palliative Medicine Team Team Cell Phone: [phone] Please utilize secure chat with additional questions, if there is no response within 30 minutes please call the above phone number  Palliative Medicine Team providers are available by phone from 7am to 7pm daily and can be reached through the team cell phone.  Should this patient require assistance outside of these hours, please call the patient's attending physician.

## 2022-07-06 NOTE — Progress Notes (Addendum)
Rounding Note    Patient Name: Marie Hunt Date of Encounter: 07/06/2022  [facility] Cardiologist: Meriam Sprague, MD - with [facility]  Subjective   Breathing ok in chair   and in bed   No CP   Inpatient Medications    Scheduled Meds:  aspirin EC  81 mg Oral QPM   doxycycline  100 mg Oral Q12H   famotidine  20 mg Oral QHS   feeding supplement (GLUCERNA SHAKE)  237 mL Oral BID BM   heparin  5,000 Units Subcutaneous Q8H   hydrALAZINE  25 mg Oral Q8H   insulin aspart  0-9 Units Subcutaneous TID WC   isosorbide mononitrate  15 mg Oral Daily   levothyroxine  37.5 mcg Oral Q0600   melatonin  6 mg Oral QPM   simvastatin  20 mg Oral QHS   traZODone  50 mg Oral QHS   Continuous Infusions:   PRN Meds: acetaminophen **OR** acetaminophen, guaiFENesin-dextromethorphan, LORazepam, oxyCODONE, polyethylene glycol   Vital Signs    Vitals:   07/05/22 0610 07/05/22 1628 07/05/22 2024 07/06/22 0614  BP:  128/68 115/68 122/81  Pulse:  (!) 103 94 90  Resp:  16 18 16   Temp:  98.4 F (36.9 C) 98.5 F (36.9 C) 98.3 F (36.8 C)  TempSrc:  Oral Oral Oral  SpO2:  92% 94% 97%  Weight: 58.2 kg   58.3 kg  Height:        Intake/Output Summary (Last 24 hours) at 07/06/2022 0655 Last data filed at 07/06/2022 1610 Gross per 24 hour  Intake --  Output 1100 ml  Net -1100 ml      07/06/2022    6:14 AM 07/05/2022    6:10 AM 07/03/2022    6:56 AM  Last 3 Weights  Weight (lbs) 128 lb 8.5 oz 128 lb 4.8 oz 139 lb 5.3 oz  Weight (kg) 58.3 kg 58.196 kg 63.2 kg      Telemetry    SR   6 beat NSVT  - Personally Reviewed  ECG    Sinus rhythm with HR 8, poor R wave progression, consider wandering pacemaker given different p wave morphologies  - Personally Reviewed  Physical Exam   GEN: No acute distress.   Neck: JVP is normal  Cardiac: RRR  II/VI systolic murmur base   NO S3    Respiratory: CTA  GI: Soft, nontender, non-distended  Ext   No   edema   Labs    High Sensitivity Troponin:   Recent Labs  Lab 07/02/22 1555 07/02/22 1853  TROPONINIHS 202* 198*     Chemistry Recent Labs  Lab 07/02/22 1555 07/02/22 1938 07/03/22 0418 07/04/22 0223 07/05/22 0212 07/06/22 0239  NA 134*   < > 136 139 136 136  K 3.7   < > 4.4 3.9 3.6 3.9  CL 102   < > 105 102 100 100  CO2 22   < > 20* 23 26 26   GLUCOSE 211*   < > 202* 200* 129* 164*  BUN 54*   < > 50* 48* 47* 45*  CREATININE 2.37*   < > 2.31* 2.37* 2.43* 2.60*  CALCIUM 8.9   < > 9.3 8.9 8.7* 8.9  MG  --   --  2.2  --   --   --   PROT 7.0  --  6.9  --   --   --   ALBUMIN 2.7*  --  2.6*  --   --   --  AST 27  --  29  --   --   --   ALT 20  --  21  --   --   --   ALKPHOS 130*  --  138*  --   --   --   BILITOT 0.5  --  0.5  --   --   --   GFRNONAA 19*   < > 20* 19* 19* 17*  ANIONGAP 10   < > 11 14 10 10    < > = values in this interval not displayed.    Lipids No results for input(s): "CHOL", "TRIG", "HDL", "LABVLDL", "LDLCALC", "CHOLHDL" in the last 168 hours.  Hematology Recent Labs  Lab 07/04/22 0223 07/05/22 0212 07/06/22 0239  WBC 6.8 6.6 6.7  RBC 2.93*  2.97* 2.97* 3.16*  HGB 7.8* 7.7* 8.2*  HCT 24.7* 25.0* 26.8*  MCV 84.3 84.2 84.8  MCH 26.6 25.9* 25.9*  MCHC 31.6 30.8 30.6  RDW 16.7* 16.7* 16.8*  PLT 204 231 263   Thyroid  Recent Labs  Lab 07/03/22 0418 07/04/22 0223  TSH 14.326*  --   FREET4  --  1.04    BNP Recent Labs  Lab 07/02/22 1555  BNP 1,307.0*    DDimer No results for input(s): "DDIMER" in the last 168 hours.   Radiology    No results found.  Cardiac Studies   Echo 07/03/22:  1. Inferior hypokinesis septal and apical akinesis . Left ventricular  ejection fraction, by estimation, is 30 to 35%. The left ventricle has  moderately decreased function. The left ventricle demonstrates regional  wall motion abnormalities (see scoring  diagram/findings for description). The left ventricular internal cavity  size was moderately  dilated. There is mild asymmetric left ventricular  hypertrophy of the basal and septal segments. Left ventricular diastolic  parameters are consistent with Grade I   diastolic dysfunction (impaired relaxation).   2. Right ventricular systolic function is normal. The right ventricular  size is normal.   3. The mitral valve is abnormal. Mild mitral valve regurgitation. No  evidence of mitral stenosis. Moderate mitral annular calcification.   4. The aortic valve is normal in structure. There is moderate  calcification of the aortic valve. There is moderate thickening of the  aortic valve. Aortic valve regurgitation is mild. Mild to moderate aortic  valve stenosis.   5. The inferior vena cava is normal in size with greater than 50%  respiratory variability, suggesting right atrial pressure of 3 mmHg.    LE [place] 07/03/22: BILATERAL:  - No evidence of deep vein thrombosis seen in the lower extremities,  bilaterally.  -No evidence of popliteal cyst, bilaterally.      Patient Profile     89 y.o. female with a PMH significant for CAS s/p left CEA 2012, HTN, HLD, CKD IV, DM2, and hypothyroidism who presented with acute heart failure - echo showing LVEF 30-35% with WMA.   Assessment & Plan    1  HFrEF  - BNP 1307 - LVEF 30-35% with WMA, G1DD  The pt has diuesed since d/c and volume status appears good, may be a little dry, though baseline Cr has been 2 to 2.5(2021) HOld lasix for today   Reasess in am    (Came in on 20 bid lasix)    Continue hydralazine and imdur   Hypertension BP well controlled  Keep on same regimen   AS   Mild to moderate on echo     Elevated trop Most  likely reflects demand ischemia in setting of CHF, renal dysfunction   Renal Cr 2.6 today (up from 2.43 yesterday)    Review of  records in June 2023 was 2.02.   In 2021 2.53. Not far from baseline it appears    Anemia Hgb 8.2  Follow closely     Prolonged QTC Avoid prolonging agents   Could go home  from cardiac standpoint   Would need very close follow up of wts and Cr  as to when lasix can be resumed.    WIll reassess labs in AM   For questions or updates, please contact [facility] Please consult [email or web address] for contact info under        Signed, Dietrich Pates, MD  07/06/2022, 6:55 AM

## 2022-07-06 NOTE — Progress Notes (Addendum)
[facility] PROGRESS NOTE   Marie Hunt ZOX:[id number] DOB: 1932/12/31 DOA: 07/02/2022  PCP: Kirstie Peri, MD  Brief History/Interval Summary: 89 y.o. female with medical history significant for CAD, hypertension, GERD, type 2 diabetes mellitus, thyroid disease, hyperlipidemia, and more presented to the ED with a chief complaint of dyspnea.  Imaging studies raise concern for CHF versus pneumonia.  She was hospitalized for further management.    Consultants: Cardiology  Procedures: Transthoracic echocardiogram    Subjective/Interval History: Patient mentions that she is feeling better.  Denies any chest pain shortness of breath this morning.  Granddaughter is at the bedside.      Assessment/Plan:  Acute respiratory failure with hypoxia Does not typically use oxygen at home.  Respiratory failure is thought to be due to pulmonary edema/CHF. She was initially requiring 12 L of oxygen by high flow nasal cannula.  She has diuresed well over the last 48 hours.  Oxygen requirements have decreased significantly.  Currently saturating in the early 90s on room air.   Initially placed on empiric antibiotics due to concern for pneumonia.  Procalcitonin 0.32.  COVID-19 PCR was negative.  Changed over to doxycycline due to prolonged QT.  5-day course should suffice.  Acute systolic CHF Echocardiogram shows EF 30 to 35% with regional wall motion abnormalities.  Grade 1 diastolic dysfunction was also noted.  Moderate aortic stenosis also appreciated. Cardiology is following.  Medical management is being pursued considering her advanced age and comorbidities.  Cannot use ACE inhibitor ARB due to chronic kidney disease. Patient was placed on furosemide infusion which was discontinued yesterday.  Cardiology to determine oral regimen today. Continue strict ins and outs and daily weight. Patient noted to be on nitrates and hydralazine. Slight worsening noted in creatinine.  This was  discussed with patient and her granddaughter.   Lower extremity Doppler studies were negative for DVT. Mildly elevated troponin likely suggestive of demand ischemia rather than an acute coronary syndrome.   Telemetry shows PVCs and occasional SVTs.  Might benefit from being on a beta-blocker.  Cardiology waiting on CHF to get better before considering beta-blockers.  Chronic kidney disease stage 4 Baseline creatinine seems to be between 2.0-2.5.  Anticipate some worsening in creatinine with the diuretic therapy.  Patient with advanced age, CKD stage IV now with systolic CHF.  She does not appear to be a candidate for hemodialysis.  This was discussed with patient and her granddaughter.  Palliative care is also following.   Monitor urine output.  Monitor labs daily.  Prolonged QT interval Monitor on telemetry.  Hypothyroidism Levothyroxine being continued.  TSH noted to be quite elevated at 14.32.  Free T4 is noted to be 1.04.  Dose of levothyroxine was increased.  Would recommend thyroid function test be rechecked in 3 to 4 weeks.  Diabetes mellitus type 2, uncontrolled with hyperglycemia HbA1c 7.7.  Continue SSI.  Holding glimepiride.  CBGs are reasonably well-controlled.  Anemia of chronic kidney disease Lower than her usual baseline.  Last year her hemoglobin was 11.5.   Anemia panel reviewed.  Ferritin 430, iron 12, TIBC 196, percent saturation 6.  B12 2753 and folate greater than 40.   Drop in hemoglobin is likely due to fluid shift.  No overt bleeding noted.  Continue to monitor. Transfuse if it drops below 7.  Hyperlipidemia Continue statin.  Essential hypertension Cardiology has made changes to antihypertensives.  Amlodipine and metoprolol were discontinued.  Currently on hydralazine and nitrates.  Blood pressure is reasonably  well-controlled.   GERD (gastroesophageal reflux disease) Continue Pepcid   Goals of care Palliative Care is following.  Currently full code.  DVT  Prophylaxis: Subcutaneous heparin Code Status: Full code Family Communication: Discussed with granddaughter and patient Disposition Plan: Home as recommended by PT and OT.  Granddaughter wants to talk to [facility] to see what other services can be provided at home.  Patient is by herself at home for about 8 hours.    Medications: Scheduled:  aspirin EC  81 mg Oral QPM   doxycycline  100 mg Oral Q12H   famotidine  20 mg Oral QHS   feeding supplement (GLUCERNA SHAKE)  237 mL Oral BID BM   heparin  5,000 Units Subcutaneous Q8H   hydrALAZINE  25 mg Oral Q8H   insulin aspart  0-9 Units Subcutaneous TID WC   isosorbide mononitrate  15 mg Oral Daily   levothyroxine  37.5 mcg Oral Q0600   melatonin  6 mg Oral QPM   simvastatin  20 mg Oral QHS   traZODone  50 mg Oral QHS   Continuous:   GNF:AOZHYQMVHQION **OR** acetaminophen, guaiFENesin-dextromethorphan, LORazepam, oxyCODONE, polyethylene glycol  Antibiotics: Anti-infectives (From admission, onward)    Start     Dose/Rate Route Frequency Ordered Stop   07/04/22 1030  doxycycline (VIBRA-TABS) tablet 100 mg        100 mg Oral Every 12 hours 07/04/22 0944 07/08/22 0959   07/03/22 1800  cefTRIAXone (ROCEPHIN) 1 g in sodium chloride 0.9 % 100 mL IVPB  Status:  Discontinued        1 g 200 mL/hr over 30 Minutes Intravenous Every 24 hours 07/02/22 2139 07/04/22 0944   07/03/22 1800  azithromycin (ZITHROMAX) 500 mg in sodium chloride 0.9 % 250 mL IVPB  Status:  Discontinued        500 mg 250 mL/hr over 60 Minutes Intravenous Every 24 hours 07/02/22 2139 07/04/22 0944   07/02/22 1745  cefTRIAXone (ROCEPHIN) 1 g in sodium chloride 0.9 % 100 mL IVPB        1 g 200 mL/hr over 30 Minutes Intravenous  Once 07/02/22 1734 07/02/22 1920   07/02/22 1745  azithromycin (ZITHROMAX) 500 mg in sodium chloride 0.9 % 250 mL IVPB        500 mg 250 mL/hr over 60 Minutes Intravenous  Once 07/02/22 1734 07/02/22 2057       Objective:  Vital Signs  Vitals:    07/05/22 0610 07/05/22 1628 07/05/22 2024 07/06/22 0614  BP:  128/68 115/68 122/81  Pulse:  (!) 103 94 90  Resp:  16 18 16   Temp:  98.4 F (36.9 C) 98.5 F (36.9 C) 98.3 F (36.8 C)  TempSrc:  Oral Oral Oral  SpO2:  92% 94% 97%  Weight: 58.2 kg   58.3 kg  Height:        Intake/Output Summary (Last 24 hours) at 07/06/2022 0910 Last data filed at 07/06/2022 6295 Gross per 24 hour  Intake --  Output 1100 ml  Net -1100 ml    Filed Weights   07/03/22 0656 07/05/22 0610 07/06/22 0614  Weight: 63.2 kg 58.2 kg 58.3 kg    General appearance: Awake alert.  In no distress Resp: Improved effort.  Improved aeration.  Fewer crackles.  No wheezing or rhonchi. Cardio: S1-S2 is normal regular.  No S3-S4.  No rubs murmurs or bruit GI: Abdomen is soft.  Nontender nondistended.  Bowel sounds are present normal.  No masses organomegaly Extremities: Improved edema  bilateral lower extremities.    Lab Results:  Data Reviewed: I have personally reviewed following labs and reports of the imaging studies  CBC: Recent Labs  Lab 07/02/22 1555 07/03/22 0418 07/04/22 0223 07/05/22 0212 07/06/22 0239  WBC 7.5 9.8 6.8 6.6 6.7  NEUTROABS 5.1 7.3  --   --   --   HGB 8.4* 8.9* 7.8* 7.7* 8.2*  HCT 27.2* 27.7* 24.7* 25.0* 26.8*  MCV 86.1 82.4 84.3 84.2 84.8  PLT 195 236 204 231 263     Basic Metabolic Panel: Recent Labs  Lab 07/02/22 1938 07/03/22 0418 07/04/22 0223 07/05/22 0212 07/06/22 0239  NA 136 136 139 136 136  K 3.7 4.4 3.9 3.6 3.9  CL 103 105 102 100 100  CO2 22 20* 23 26 26   GLUCOSE 183* 202* 200* 129* 164*  BUN 55* 50* 48* 47* 45*  CREATININE 2.40* 2.31* 2.37* 2.43* 2.60*  CALCIUM 9.1 9.3 8.9 8.7* 8.9  MG  --  2.2  --   --   --      GFR: Estimated Creatinine Clearance: 13.2 mL/min (A) (by C-G formula based on SCr of 2.6 mg/dL (H)).  Liver Function Tests: Recent Labs  Lab 07/02/22 1555 07/03/22 0418  AST 27 29  ALT 20 21  ALKPHOS 130* 138*  BILITOT 0.5 0.5   PROT 7.0 6.9  ALBUMIN 2.7* 2.6*     CBG: Recent Labs  Lab 07/05/22 0757 07/05/22 1144 07/05/22 1626 07/05/22 2100 07/06/22 0908  GLUCAP 116* 122* 118* 223* 126*     Thyroid Function Tests: Recent Labs    07/04/22 0223  FREET4 1.04      Recent Results (from the past 240 hour(s))  SARS Coronavirus 2 by RT PCR (hospital order, performed in [facility] hospital lab) *cepheid single result test* Anterior Nasal Swab     Status: None   Collection Time: 07/02/22  5:45 PM   Specimen: Anterior Nasal Swab  Result Value Ref Range Status   SARS Coronavirus 2 by RT PCR NEGATIVE NEGATIVE Final    Comment: (NOTE) SARS-CoV-2 target nucleic acids are NOT DETECTED.  The SARS-CoV-2 RNA is generally detectable in upper and lower respiratory specimens during the acute phase of infection. The lowest concentration of SARS-CoV-2 viral copies this assay can detect is 250 copies / mL. A negative result does not preclude SARS-CoV-2 infection and should not be used as the sole basis for treatment or other patient management decisions.  A negative result may occur with improper specimen collection / handling, submission of specimen other than nasopharyngeal swab, presence of viral mutation(s) within the areas targeted by this assay, and inadequate number of viral copies (<250 copies / mL). A negative result must be combined with clinical observations, patient history, and epidemiological information.  Fact Sheet for Patients:   RoadLapTop.co.za  Fact Sheet for Healthcare Providers: http://kim-miller.com/  This test is not yet approved or  cleared by the [place] FDA and has been authorized for detection and/or diagnosis of SARS-CoV-2 by FDA under an Emergency Use Authorization (EUA).  This EUA will remain in effect (meaning this test can be used) for the duration of the COVID-19 declaration under Section 564(b)(1) of the Act, 21  U.S.C. section 360bbb-3(b)(1), unless the authorization is terminated or revoked sooner.  Performed at [facility], [address], [place], [place] [address]       Radiology Studies: No results found.     LOS: 3 days   [employer]  [facility]  Pager on [email or web address]  07/06/2022, 9:10 AM

## 2022-07-07 ENCOUNTER — Other Ambulatory Visit: Payer: Self-pay

## 2022-07-07 DIAGNOSIS — I5043 Acute on chronic combined systolic (congestive) and diastolic (congestive) heart failure: Secondary | ICD-10-CM | POA: Diagnosis not present

## 2022-07-07 DIAGNOSIS — N179 Acute kidney failure, unspecified: Secondary | ICD-10-CM | POA: Diagnosis not present

## 2022-07-07 DIAGNOSIS — I5042 Chronic combined systolic (congestive) and diastolic (congestive) heart failure: Secondary | ICD-10-CM

## 2022-07-07 DIAGNOSIS — J9601 Acute respiratory failure with hypoxia: Secondary | ICD-10-CM | POA: Diagnosis not present

## 2022-07-07 LAB — BASIC METABOLIC PANEL
Anion gap: 7 (ref 5–15)
BUN: 44 mg/dL — ABNORMAL HIGH (ref 8–23)
CO2: 26 mmol/L (ref 22–32)
Calcium: 9 mg/dL (ref 8.9–10.3)
Chloride: 102 mmol/L (ref 98–111)
Creatinine, Ser: 2.72 mg/dL — ABNORMAL HIGH (ref 0.44–1.00)
GFR, Estimated: 16 mL/min — ABNORMAL LOW (ref >60)
Glucose, Bld: 135 mg/dL — ABNORMAL HIGH (ref 70–99)
Potassium: 3.9 mmol/L (ref 3.5–5.1)
Sodium: 135 mmol/L (ref 135–145)

## 2022-07-07 LAB — GLUCOSE, CAPILLARY
Glucose-Capillary: 146 mg/dL — ABNORMAL HIGH (ref 70–99)
Glucose-Capillary: 166 mg/dL — ABNORMAL HIGH (ref 70–99)
Glucose-Capillary: 117 mg/dL — ABNORMAL HIGH (ref 70–99)
Glucose-Capillary: 160 mg/dL — ABNORMAL HIGH (ref 70–99)

## 2022-07-07 MED ORDER — METOPROLOL SUCCINATE ER 25 MG PO TB24
12.5000 mg | ORAL_TABLET | Freq: Every day | ORAL | Status: DC
  Administered 2022-07-07 – 2022-07-08 (×2): 12.5 mg via ORAL
  Filled 2022-07-07 (×2): qty 1

## 2022-07-07 NOTE — Progress Notes (Addendum)
Rounding Note    Patient Name: Marie Hunt Date of Encounter: 07/07/2022  [facility] Cardiologist: Meriam Sprague, MD   Subjective   Patient comfortable appearing in bed this morning, in no acute distress. Denies shortness of breath or chest pain. Has a productive cough. Patient and daughter curious about discharge.  Inpatient Medications    Scheduled Meds:  aspirin EC  81 mg Oral QPM   doxycycline  100 mg Oral Q12H   famotidine  20 mg Oral QHS   feeding supplement (GLUCERNA SHAKE)  237 mL Oral BID BM   heparin  5,000 Units Subcutaneous Q8H   hydrALAZINE  25 mg Oral Q8H   insulin aspart  0-9 Units Subcutaneous TID WC   isosorbide mononitrate  15 mg Oral Daily   levothyroxine  37.5 mcg Oral Q0600   melatonin  6 mg Oral QPM   simvastatin  20 mg Oral QHS   traZODone  50 mg Oral QHS   Continuous Infusions:  PRN Meds: acetaminophen **OR** acetaminophen, guaiFENesin-dextromethorphan, LORazepam, oxyCODONE, polyethylene glycol   Vital Signs    Vitals:   07/06/22 2154 07/06/22 2157 07/07/22 0438 07/07/22 0441  BP: (!) 116/50 (!) 116/50 126/77   Pulse:   64   Resp:   18   Temp:   97.8 F (36.6 C)   TempSrc:   Oral   SpO2:   99%   Weight:    58.2 kg  Height:        Intake/Output Summary (Last 24 hours) at 07/07/2022 0857 Last data filed at 07/07/2022 0440 Gross per 24 hour  Intake 240 ml  Output 300 ml  Net -60 ml      07/07/2022    4:41 AM 07/06/2022    6:14 AM 07/05/2022    6:10 AM  Last 3 Weights  Weight (lbs) 128 lb 4.9 oz 128 lb 8.5 oz 128 lb 4.8 oz  Weight (kg) 58.2 kg 58.3 kg 58.196 kg      Telemetry    Sinus rhythm with intermittent tachycardia and PACs/PVCs. 8 beat run of NSVT - Personally Reviewed  ECG    Sinus rhythm with PACs- Personally Reviewed  Physical Exam   GEN: No acute distress.   Neck: No JVD Cardiac: RRR, harsh systolic murmur at LUSB  Respiratory: Clear to auscultation bilaterally. GI: Soft, nontender,  non-distended  MS: No edema; No deformity. Neuro:  Nonfocal  Psych: Normal affect   Labs    High Sensitivity Troponin:   Recent Labs  Lab 07/02/22 1555 07/02/22 1853  TROPONINIHS 202* 198*     Chemistry Recent Labs  Lab 07/02/22 1555 07/02/22 1938 07/03/22 0418 07/04/22 0223 07/05/22 0212 07/06/22 0239 07/07/22 0607  NA 134*   < > 136   < > 136 136 135  K 3.7   < > 4.4   < > 3.6 3.9 3.9  CL 102   < > 105   < > 100 100 102  CO2 22   < > 20*   < > 26 26 26   GLUCOSE 211*   < > 202*   < > 129* 164* 135*  BUN 54*   < > 50*   < > 47* 45* 44*  CREATININE 2.37*   < > 2.31*   < > 2.43* 2.60* 2.72*  CALCIUM 8.9   < > 9.3   < > 8.7* 8.9 9.0  MG  --   --  2.2  --   --   --   --  PROT 7.0  --  6.9  --   --   --   --   ALBUMIN 2.7*  --  2.6*  --   --   --   --   AST 27  --  29  --   --   --   --   ALT 20  --  21  --   --   --   --   ALKPHOS 130*  --  138*  --   --   --   --   BILITOT 0.5  --  0.5  --   --   --   --   GFRNONAA 19*   < > 20*   < > 19* 17* 16*  ANIONGAP 10   < > 11   < > 10 10 7    < > = values in this interval not displayed.    Lipids No results for input(s): "CHOL", "TRIG", "HDL", "LABVLDL", "LDLCALC", "CHOLHDL" in the last 168 hours.  Hematology Recent Labs  Lab 07/04/22 0223 07/05/22 0212 07/06/22 0239  WBC 6.8 6.6 6.7  RBC 2.93*  2.97* 2.97* 3.16*  HGB 7.8* 7.7* 8.2*  HCT 24.7* 25.0* 26.8*  MCV 84.3 84.2 84.8  MCH 26.6 25.9* 25.9*  MCHC 31.6 30.8 30.6  RDW 16.7* 16.7* 16.8*  PLT 204 231 263   Thyroid  Recent Labs  Lab 07/03/22 0418 07/04/22 0223  TSH 14.326*  --   FREET4  --  1.04    BNP Recent Labs  Lab 07/02/22 1555  BNP 1,307.0*    DDimer No results for input(s): "DDIMER" in the last 168 hours.   Radiology    No results found.  Cardiac Studies   07/03/22 TTE  IMPRESSIONS     1. Inferior hypokinesis septal and apical akinesis . Left ventricular  ejection fraction, by estimation, is 30 to 35%. The left ventricle has   moderately decreased function. The left ventricle demonstrates regional  wall motion abnormalities (see scoring  diagram/findings for description). The left ventricular internal cavity  size was moderately dilated. There is mild asymmetric left ventricular  hypertrophy of the basal and septal segments. Left ventricular diastolic  parameters are consistent with Grade I   diastolic dysfunction (impaired relaxation).   2. Right ventricular systolic function is normal. The right ventricular  size is normal.   3. The mitral valve is abnormal. Mild mitral valve regurgitation. No  evidence of mitral stenosis. Moderate mitral annular calcification.   4. The aortic valve is normal in structure. There is moderate  calcification of the aortic valve. There is moderate thickening of the  aortic valve. Aortic valve regurgitation is mild. Mild to moderate aortic  valve stenosis.   5. The inferior vena cava is normal in size with greater than 50%  respiratory variability, suggesting right atrial pressure of 3 mmHg.   FINDINGS   Left Ventricle: Inferior hypokinesis septal and apical akinesis. Left  ventricular ejection fraction, by estimation, is 30 to 35%. The left  ventricle has moderately decreased function. The left ventricle  demonstrates regional wall motion abnormalities.  The left ventricular internal cavity size was moderately dilated. There is  mild asymmetric left ventricular hypertrophy of the basal and septal  segments. Left ventricular diastolic parameters are consistent with Grade  I diastolic dysfunction (impaired  relaxation).   Right Ventricle: The right ventricular size is normal. No increase in  right ventricular wall thickness. Right ventricular systolic function is  normal.   Left  Atrium: Left atrial size was normal in size.   Right Atrium: Right atrial size was normal in size.   Pericardium: There is no evidence of pericardial effusion.   Mitral Valve: The mitral valve  is abnormal. There is mild thickening of  the mitral valve leaflet(s). There is mild calcification of the mitral  valve leaflet(s). Moderate mitral annular calcification. Mild mitral valve  regurgitation. No evidence of mitral   valve stenosis.   Tricuspid Valve: The tricuspid valve is normal in structure. Tricuspid  valve regurgitation is mild . No evidence of tricuspid stenosis.   Aortic Valve: The aortic valve is normal in structure. There is moderate  calcification of the aortic valve. There is moderate thickening of the  aortic valve. Aortic valve regurgitation is mild. Aortic regurgitation PHT  measures 373 msec. Mild to moderate   aortic stenosis is present. Aortic valve mean gradient measures 13.5  mmHg. Aortic valve peak gradient measures 23.3 mmHg. Aortic valve area, by  VTI measures 1.77 cm.   Pulmonic Valve: The pulmonic valve was normal in structure. Pulmonic valve  regurgitation is not visualized. No evidence of pulmonic stenosis.   Aorta: The aortic root is normal in size and structure.   Venous: The inferior vena cava is normal in size with greater than 50%  respiratory variability, suggesting right atrial pressure of 3 mmHg.   IAS/Shunts: The interatrial septum was not well visualized.   Patient Profile     89 y.o. female with a PMH significant for CAS s/p left CEA 2012, HTN, HLD, CKD IV, DM2, and hypothyroidism who presented with acute heart failure - echo showing LVEF 30-35% with WMA.   Assessment & Plan    Acute systolic and diastolic heart failure   Patient admitted with symptoms of acute CHF. Found with BNP 1307. TTE showed LVEF 30-35% with regional wall motion abnormalities.   Patient now net negative 6.8L. Weight down 63.2kg -> 58.2kg. Creatinine continuing to rise, will hold lasix again today. Should be able to transition to oral lasix when renal function stabilizes. Likely to need titrated dosing at home based on weight/lower extremity edema. GDMT  limited by CKD. Continue Hydralazine and Imdur for afterload reduction. Will plan to resume home Toprol XL, decrease dose to 12.5mg .   Aortic stenosis  TTE showed mild to moderate aortic stenosis is present. Aortic valve mean gradient measures 13.5 mmHg. Aortic valve peak gradient measures 23.3 mmHg. Aortic valve area, by VTI measures 1.77 cm.   Will continue to follow in outpatient setting.  AKI on CKD IV  Creatinine rising today, 2.43->2.6->2.72. Continue to hold Lasix.  Hypertension  BP stable, continue hydralazine 25mg  Q8hr, Imdur 15mg . Resume reduced dose Metoprolol Succinate 12.5mg  QD.  Elevated troponin  Troponin 202->198 this admission. Suspect demand ischemia/type II MI with acute congestive heart failure with underlying CKD IV. Patient would be a poor candidate for ischemic evaluation with CKD.  Hyperlipidemia  Continue home statin.   Per primary team: Thyroid disease Anemia DM     For questions or updates, please contact [facility] Please consult [email or web address] for contact info under        Signed, Perlie Gold, PA-C  07/07/2022, 8:57 AM     Patient seen and examined.  Agree with above documentation.  On exam, patient is alert and oriented, regular rate and rhythm, no murmurs, lungs CTAB, no LE edema or JVD.  Appears euvolemic. Worsening renal function, will hold Lasix today.  Restarted Toprol-XL.  Little Ishikawa, MD

## 2022-07-07 NOTE — Progress Notes (Signed)
Physical Therapy Treatment Patient Details Name: Marie Hunt MRN: [id number] DOB: Dec 29, 1932 Today's Date: 07/07/2022   History of Present Illness 89 yo female presented 07/02/22 with work up for acute respiratory failure with hypoxia, acute CHF and elevated troponin. PMH: CAD HTN DM2 HLD anemia CAD diverticulosis  8L HFNC salter with bipap eye surg,L hemiarthroplasty remote smoker    PT Comments    Pt making steady progress with mobility and activity tolerance. Will need some family support at home.    Recommendations for follow up therapy are one component of a multi-disciplinary discharge planning process, led by the attending physician.  Recommendations may be updated based on patient status, additional functional criteria and insurance authorization.  Follow Up Recommendations       Assistance Recommended at Discharge Intermittent Supervision/Assistance  Patient can return home with the following A little help with walking and/or transfers;A little help with bathing/dressing/bathroom;Assistance with cooking/housework;Help with stairs or ramp for entrance;Assist for transportation   Equipment Recommendations  None recommended by PT    Recommendations for Other Services       Precautions / Restrictions Precautions Precautions: Fall Precaution Comments: watch SpO2 Restrictions Weight Bearing Restrictions: No     Mobility  Bed Mobility               General bed mobility comments: Pt up in chair    Transfers Overall transfer level: Needs assistance Equipment used: Rolling walker (2 wheels) Transfers: Sit to/from Stand Sit to Stand: Min guard           General transfer comment: Assist for safety. Incr time and effort    Ambulation/Gait Ambulation/Gait assistance: Min guard Gait Distance (Feet): 160 Feet Assistive device: Rolling walker (2 wheels) Gait Pattern/deviations: Step-through pattern, Decreased stride length, Trunk flexed Gait velocity:  decr Gait velocity interpretation: <1.31 ft/sec, indicative of household ambulator   General Gait Details: Kyphotic posture with inferior gaze. Assist for safety   Stairs             Wheelchair Mobility    Modified Rankin (Stroke Patients Only)       Balance Overall balance assessment: Needs assistance Sitting-balance support: No upper extremity supported, Feet supported Sitting balance-Leahy Scale: Good     Standing balance support: Bilateral upper extremity supported, During functional activity, No upper extremity supported Standing balance-Leahy Scale: Fair Standing balance comment: Able to stand statically without UE support, benefits from RW to ambulate                            Cognition Arousal/Alertness: Awake/alert Behavior During Therapy: WFL for tasks assessed/performed Overall Cognitive Status: History of cognitive impairments - at baseline                                 General Comments: Decr short term memory. Follows commands        Exercises      General Comments General comments (skin integrity, edema, etc.): VSS on RA      Pertinent Vitals/Pain      Home Living                          Prior Function            PT Goals (current goals can now be found in the care plan section) Acute Rehab PT Goals Patient Stated Goal:  to improve Progress towards PT goals: Progressing toward goals    Frequency    Min 1X/week      PT Plan      Co-evaluation              AM-PAC PT "6 Clicks" Mobility   Outcome Measure  Help needed turning from your back to your side while in a flat bed without using bedrails?: None Help needed moving from lying on your back to sitting on the side of a flat bed without using bedrails?: None Help needed moving to and from a bed to a chair (including a wheelchair)?: A Little Help needed standing up from a chair using your arms (e.g., wheelchair or bedside chair)?:  A Little Help needed to walk in hospital room?: A Little Help needed climbing 3-5 steps with a railing? : A Little 6 Click Score: 20    End of Session Equipment Utilized During Treatment: Gait belt Activity Tolerance: Patient tolerated treatment well Patient left: with call bell/phone within reach;with family/visitor present;in chair;with chair alarm set   PT Visit Diagnosis: Unsteadiness on feet (R26.81);Other abnormalities of gait and mobility (R26.89);Muscle weakness (generalized) (M62.81);Difficulty in walking, not elsewhere classified (R26.2)     Time: 1610-9604 PT Time Calculation (min) (ACUTE ONLY): 23 min  Charges:  $Gait Training: 23-37 mins                     [facility] PT Acute Rehabilitation Services Office [phone]    Angelina Ok [facility] 07/07/2022, 2:27 PM

## 2022-07-07 NOTE — Care Management Important Message (Signed)
Important Message  Patient Details  Name: Marie Hunt MRN: [id number] Date of Birth: 1932/05/31   Medicare Important Message Given:  Yes     Renie Ora 07/07/2022, 11:15 AM

## 2022-07-07 NOTE — Progress Notes (Signed)
[facility] PROGRESS NOTE   Marie Hunt:[id number] DOB: 08-02-1932 DOA: 07/02/2022  PCP: Kirstie Peri, MD  Brief History/Interval Summary: 89 y.o. female with medical history significant for CAD, hypertension, GERD, type 2 diabetes mellitus, thyroid disease, hyperlipidemia, and more presented to the ED with a chief complaint of dyspnea.  Imaging studies raise concern for CHF versus pneumonia.  She was hospitalized for further management.    Consultants: Cardiology  Procedures: Transthoracic echocardiogram    Subjective/Interval History: Patient feels well.  Denies any chest pain shortness of breath.  Has been urinating without difficulty.  Granddaughter is at the bedside.    Assessment/Plan:  Acute systolic CHF Echocardiogram shows EF 30 to 35% with regional wall motion abnormalities.  Grade 1 diastolic dysfunction was also noted.  Moderate aortic stenosis also appreciated. Cardiology is following.  Medical management is being pursued considering her advanced age and comorbidities.  Cannot use ACE inhibitor ARB due to chronic kidney disease. Patient was placed on furosemide infusion which was discontinued on 5/11. Continue strict ins and outs and daily weight. Patient noted to be on nitrates and hydralazine. Creatinine continues to worsen.  Wait for it to stabilize.   Lower extremity Doppler studies were negative for DVT. Mildly elevated troponin likely suggestive of demand ischemia rather than an acute coronary syndrome.   Telemetry shows PVCs and occasional SVTs.  To be started on Toprol today per cardiology.  Acute respiratory failure with hypoxia Does not typically use oxygen at home.  Respiratory failure is thought to be due to pulmonary edema/CHF. She was initially requiring 12 L of oxygen by high flow nasal cannula.  Patient was placed on IV furosemide infusion with which he diuresed well.  She was weaned off of oxygen.   Initially placed on empiric  antibiotics due to concern for pneumonia.  Procalcitonin 0.32.  COVID-19 PCR was negative.  Changed over to doxycycline due to prolonged QT.  5-day course should suffice.  Acute on Chronic kidney disease stage 4 Baseline creatinine seems to be between 2.0-2.5.  Anticipate some worsening in creatinine with the diuretic therapy.  Patient with advanced age, CKD stage IV now with systolic CHF.  She does not appear to be a candidate for hemodialysis.  This was discussed with patient and her granddaughter.  Palliative care is also following.   Creatinine noted to be higher today compared to yesterday.  Continue to monitor.  Hopefully will plateau soon. Monitor urine output.  Monitor labs daily.  Prolonged QT interval Monitor on telemetry.  Hypothyroidism Levothyroxine being continued.  TSH noted to be quite elevated at 14.32.  Free T4 is noted to be 1.04.  Dose of levothyroxine was increased.  Would recommend thyroid function test be rechecked in 3 to 4 weeks.  Diabetes mellitus type 2, uncontrolled with hyperglycemia HbA1c 7.7.  Continue SSI.  Holding glimepiride.  CBGs are reasonably well-controlled.  Anemia of chronic kidney disease Lower than her usual baseline.  Last year her hemoglobin was 11.5.   Anemia panel reviewed.  Ferritin 430, iron 12, TIBC 196, percent saturation 6.  B12 2753 and folate greater than 40.   Drop in hemoglobin is likely due to fluid shift.  No overt bleeding noted.  Continue to monitor. Transfuse if it drops below 7.  Hyperlipidemia Continue statin.  Essential hypertension Cardiology has made changes to antihypertensives.  Amlodipine and metoprolol were discontinued.  Currently on metoprolol, hydralazine and nitrates.  Blood pressure is reasonably well-controlled.   GERD (gastroesophageal reflux disease)  Continue Pepcid   Goals of care Palliative Care is following.  Currently full code.  DVT Prophylaxis: Subcutaneous heparin Code Status: Full code Family  Communication: Discussed with granddaughter and patient Disposition Plan: Home as recommended by PT and OT.  Granddaughter wants to talk to [facility] to see what other services can be provided at home.  Patient is by herself at home for about 8 hours.    Medications: Scheduled:  aspirin EC  81 mg Oral QPM   doxycycline  100 mg Oral Q12H   famotidine  20 mg Oral QHS   feeding supplement (GLUCERNA SHAKE)  237 mL Oral BID BM   heparin  5,000 Units Subcutaneous Q8H   hydrALAZINE  25 mg Oral Q8H   insulin aspart  0-9 Units Subcutaneous TID WC   isosorbide mononitrate  15 mg Oral Daily   levothyroxine  37.5 mcg Oral Q0600   melatonin  6 mg Oral QPM   metoprolol succinate  12.5 mg Oral Daily   simvastatin  20 mg Oral QHS   traZODone  50 mg Oral QHS   Continuous:   Hunt:[id number] **OR** acetaminophen, guaiFENesin-dextromethorphan, LORazepam, oxyCODONE, polyethylene glycol  Antibiotics: Anti-infectives (From admission, onward)    Start     Dose/Rate Route Frequency Ordered Stop   07/04/22 1030  doxycycline (VIBRA-TABS) tablet 100 mg        100 mg Oral Every 12 hours 07/04/22 0944 07/08/22 0959   07/03/22 1800  cefTRIAXone (ROCEPHIN) 1 g in sodium chloride 0.9 % 100 mL IVPB  Status:  Discontinued        1 g 200 mL/hr over 30 Minutes Intravenous Every 24 hours 07/02/22 2139 07/04/22 0944   07/03/22 1800  azithromycin (ZITHROMAX) 500 mg in sodium chloride 0.9 % 250 mL IVPB  Status:  Discontinued        500 mg 250 mL/hr over 60 Minutes Intravenous Every 24 hours 07/02/22 2139 07/04/22 0944   07/02/22 1745  cefTRIAXone (ROCEPHIN) 1 g in sodium chloride 0.9 % 100 mL IVPB        1 g 200 mL/hr over 30 Minutes Intravenous  Once 07/02/22 1734 07/02/22 1920   07/02/22 1745  azithromycin (ZITHROMAX) 500 mg in sodium chloride 0.9 % 250 mL IVPB        500 mg 250 mL/hr over 60 Minutes Intravenous  Once 07/02/22 1734 07/02/22 2057       Objective:  Vital Signs  Vitals:   07/06/22 2157  07/07/22 0438 07/07/22 0441 07/07/22 0955  BP: (!) 116/50 126/77  119/73  Pulse:  64    Resp:  18  16  Temp:  97.8 F (36.6 C)    TempSrc:  Oral    SpO2:  99%    Weight:   58.2 kg   Height:        Intake/Output Summary (Last 24 hours) at 07/07/2022 0959 Last data filed at 07/07/2022 0440 Gross per 24 hour  Intake 240 ml  Output 300 ml  Net -60 ml    Filed Weights   07/05/22 0610 07/06/22 0614 07/07/22 0441  Weight: 58.2 kg 58.3 kg 58.2 kg    General appearance: Awake alert.  In no distress Resp: Normal effort at rest.  Improved air entry bilaterally.  Continues to have crackles at the bases.  But less than before. Cardio: S1-S2 is normal regular.  No S3-S4.  No rubs murmurs or bruit GI: Abdomen is soft.  Nontender nondistended.  Bowel sounds are present normal.  No  masses organomegaly Extremities: Improved edema bilateral lower extremities     Lab Results:  Data Reviewed: I have personally reviewed following labs and reports of the imaging studies  CBC: Recent Labs  Lab 07/02/22 1555 07/03/22 0418 07/04/22 0223 07/05/22 0212 07/06/22 0239  WBC 7.5 9.8 6.8 6.6 6.7  NEUTROABS 5.1 7.3  --   --   --   HGB 8.4* 8.9* 7.8* 7.7* 8.2*  HCT 27.2* 27.7* 24.7* 25.0* 26.8*  MCV 86.1 82.4 84.3 84.2 84.8  PLT 195 236 204 231 263     Basic Metabolic Panel: Recent Labs  Lab 07/03/22 0418 07/04/22 0223 07/05/22 0212 07/06/22 0239 07/07/22 0607  NA 136 139 136 136 135  K 4.4 3.9 3.6 3.9 3.9  CL 105 102 100 100 102  CO2 20* 23 26 26 26   GLUCOSE 202* 200* 129* 164* 135*  BUN 50* 48* 47* 45* 44*  CREATININE 2.31* 2.37* 2.43* 2.60* 2.72*  CALCIUM 9.3 8.9 8.7* 8.9 9.0  MG 2.2  --   --   --   --      GFR: Estimated Creatinine Clearance: 12.6 mL/min (A) (by C-G formula based on SCr of 2.72 mg/dL (H)).  Liver Function Tests: Recent Labs  Lab 07/02/22 1555 07/03/22 0418  AST 27 29  ALT 20 21  ALKPHOS 130* 138*  BILITOT 0.5 0.5  PROT 7.0 6.9  ALBUMIN 2.7* 2.6*      CBG: Recent Labs  Lab 07/06/22 0908 07/06/22 1327 07/06/22 1750 07/06/22 2104 07/07/22 0900  GLUCAP 126* 175* 191* 187* 146*     Recent Results (from the past 240 hour(s))  SARS Coronavirus 2 by RT PCR (hospital order, performed in [facility] hospital lab) *cepheid single result test* Anterior Nasal Swab     Status: None   Collection Time: 07/02/22  5:45 PM   Specimen: Anterior Nasal Swab  Result Value Ref Range Status   SARS Coronavirus 2 by RT PCR NEGATIVE NEGATIVE Final    Comment: (NOTE) SARS-CoV-2 target nucleic acids are NOT DETECTED.  The SARS-CoV-2 RNA is generally detectable in upper and lower respiratory specimens during the acute phase of infection. The lowest concentration of SARS-CoV-2 viral copies this assay can detect is 250 copies / mL. A negative result does not preclude SARS-CoV-2 infection and should not be used as the sole basis for treatment or other patient management decisions.  A negative result may occur with improper specimen collection / handling, submission of specimen other than nasopharyngeal swab, presence of viral mutation(s) within the areas targeted by this assay, and inadequate number of viral copies (<250 copies / mL). A negative result must be combined with clinical observations, patient history, and epidemiological information.  Fact Sheet for Patients:   RoadLapTop.co.za  Fact Sheet for Healthcare Providers: http://kim-miller.com/  This test is not yet approved or  cleared by the [place] FDA and has been authorized for detection and/or diagnosis of SARS-CoV-2 by FDA under an Emergency Use Authorization (EUA).  This EUA will remain in effect (meaning this test can be used) for the duration of the COVID-19 declaration under Section 564(b)(1) of the Act, 21 U.S.C. section 360bbb-3(b)(1), unless the authorization is terminated or revoked sooner.  Performed at [facility], [address], [place], [place] [address]       Radiology Studies: No results found.     LOS: 4 days   Miarose Lippert [employer] on [email or web address]  07/07/2022, 9:59 AM

## 2022-07-07 NOTE — Progress Notes (Signed)
CSW received consult from Palliative NP [facility]. Patients Granddaughter had some questions regarding Medicaid for patient. CSW called patients Granddaughter [name]. CSW informed [name] that it is showing in system that patient has Medicaid. All questions answered. No further questions reported at this time.

## 2022-07-07 NOTE — TOC Progression Note (Signed)
Transition of Care [facility]) - Progression Note    Patient Details  Name: Marie Hunt MRN: [id number] Date of Birth: [birth year]  Transition of Care [facility]) CM/SW Contact  Graves-Bigelow, Lamar Laundry, RN Phone Number: 07/07/2022, 4:41 PM  Clinical Narrative: Case Manager spoke with granddaughter Marie Hunt and discussed the plan for home. Granddaughter is agreeable with plan of care and Medicare.gov list provided-chose [facility]. Referral submitted to [facility] and start of care to begin within 24-48 hours post transition home. Granddaughter has asked for DME rolling walker for home- will order via [employer] and it will be delivered to the home if okay with MD. Patient has DME shower chair in the home.While discussing plan of care in the home- Medicaid looks active. Granddaughter to call DSS to see if active and to check on pcs hours for the patient. No further home needs identified at this time. Granddaughter appreciative of plan of care.   Expected Discharge Plan: Home w Home Health Services Barriers to Discharge: No Barriers Identified  Expected Discharge Plan and Services In-house Referral: NA Discharge Planning Services: CM Consult Post Acute Care Choice: Home Health Living arrangements for the past 2 months: Apartment                 DME Arranged: Walker rolling DME Agency: [employer] Date DME Agency Contacted: 07/07/22 Time DME Agency Contacted: 1640 Representative spoke with at DME Agency: Barbara Cower HH Arranged: PT, OT, Nurse's Aide HH Agency: [facility] Date [facility] Agency Contacted: 07/07/22 Time HH Agency Contacted: 1640 Representative spoke with at [facility] Agency: Tresa Endo   Social Determinants of Health (SDOH) Interventions SDOH Screenings   Tobacco Use: Medium Risk (08/09/2021)    Readmission Risk Interventions     No data to display

## 2022-07-07 NOTE — Plan of Care (Signed)

## 2022-07-07 NOTE — Progress Notes (Signed)
Family at bedside requested all 4 bedrails up

## 2022-07-08 ENCOUNTER — Other Ambulatory Visit: Payer: Self-pay

## 2022-07-08 DIAGNOSIS — N179 Acute kidney failure, unspecified: Secondary | ICD-10-CM | POA: Diagnosis not present

## 2022-07-08 DIAGNOSIS — I5043 Acute on chronic combined systolic (congestive) and diastolic (congestive) heart failure: Secondary | ICD-10-CM | POA: Diagnosis not present

## 2022-07-08 LAB — BASIC METABOLIC PANEL
Anion gap: 9 (ref 5–15)
BUN: 46 mg/dL — ABNORMAL HIGH (ref 8–23)
CO2: 26 mmol/L (ref 22–32)
Calcium: 9.1 mg/dL (ref 8.9–10.3)
Chloride: 102 mmol/L (ref 98–111)
Creatinine, Ser: 2.72 mg/dL — ABNORMAL HIGH (ref 0.44–1.00)
GFR, Estimated: 16 mL/min — ABNORMAL LOW (ref >60)
Glucose, Bld: 166 mg/dL — ABNORMAL HIGH (ref 70–99)
Potassium: 4.2 mmol/L (ref 3.5–5.1)
Sodium: 137 mmol/L (ref 135–145)

## 2022-07-08 LAB — GLUCOSE, CAPILLARY
Glucose-Capillary: 159 mg/dL — ABNORMAL HIGH (ref 70–99)
Glucose-Capillary: 75 mg/dL (ref 70–99)
Glucose-Capillary: 132 mg/dL — ABNORMAL HIGH (ref 70–99)

## 2022-07-08 MED ORDER — HYDRALAZINE HCL 25 MG PO TABS: 25.0000 mg | ORAL_TABLET | Freq: Three times a day (TID) | ORAL | 2 refills | Status: DC

## 2022-07-08 MED ORDER — LEVOTHYROXINE SODIUM 75 MCG PO TABS: 37.5000 ug | ORAL_TABLET | Freq: Every day | ORAL | 2 refills | Status: DC

## 2022-07-08 MED ORDER — FUROSEMIDE 40 MG PO TABS: 40.0000 mg | ORAL_TABLET | Freq: Every day | ORAL | 2 refills | Status: DC

## 2022-07-08 MED ORDER — ISOSORBIDE MONONITRATE ER 30 MG PO TB24: 15.0000 mg | ORAL_TABLET | Freq: Every day | ORAL | 2 refills | Status: DC

## 2022-07-08 MED ORDER — METOPROLOL SUCCINATE ER 25 MG PO TB24: 12.5000 mg | ORAL_TABLET | Freq: Every day | ORAL | 2 refills | Status: DC

## 2022-07-08 MED ORDER — FUROSEMIDE 40 MG PO TABS
40.0000 mg | ORAL_TABLET | Freq: Every day | ORAL | Status: DC
  Administered 2022-07-08: 40 mg via ORAL
  Filled 2022-07-08: qty 1

## 2022-07-08 NOTE — Plan of Care (Signed)
Problem: Education: Goal: Knowledge of General Education information will improve Description: Including pain rating scale, medication(s)/side effects and non-pharmacologic comfort measures 07/08/2022 1706 by Georgiann Mccoy, RN Outcome: Adequate for Discharge 07/08/2022 1057 by Georgiann Mccoy, RN Outcome: Progressing   Problem: Health Behavior/Discharge Planning: Goal: Ability to manage health-related needs will improve 07/08/2022 1706 by Georgiann Mccoy, RN Outcome: Adequate for Discharge 07/08/2022 1057 by Georgiann Mccoy, RN Outcome: Progressing   Problem: Clinical Measurements: Goal: Ability to maintain clinical measurements within normal limits will improve 07/08/2022 1706 by Georgiann Mccoy, RN Outcome: Adequate for Discharge 07/08/2022 1057 by Georgiann Mccoy, RN Outcome: Progressing Goal: Will remain free from infection 07/08/2022 1706 by Georgiann Mccoy, RN Outcome: Adequate for Discharge 07/08/2022 1057 by Georgiann Mccoy, RN Outcome: Progressing Goal: Diagnostic test results will improve 07/08/2022 1706 by Georgiann Mccoy, RN Outcome: Adequate for Discharge 07/08/2022 1057 by Georgiann Mccoy, RN Outcome: Progressing Goal: Respiratory complications will improve 07/08/2022 1706 by Georgiann Mccoy, RN Outcome: Adequate for Discharge 07/08/2022 1057 by Georgiann Mccoy, RN Outcome: Progressing Goal: Cardiovascular complication will be avoided 07/08/2022 1706 by Georgiann Mccoy, RN Outcome: Adequate for Discharge 07/08/2022 1057 by Georgiann Mccoy, RN Outcome: Progressing   Problem: Activity: Goal: Risk for activity intolerance will decrease 07/08/2022 1706 by Georgiann Mccoy, RN Outcome: Adequate for Discharge 07/08/2022 1057 by Georgiann Mccoy, RN Outcome: Progressing   Problem: Nutrition: Goal: Adequate nutrition will be maintained 07/08/2022 1706 by Georgiann Mccoy, RN Outcome: Adequate for Discharge 07/08/2022 1057 by Georgiann Mccoy, RN Outcome:  Progressing   Problem: Coping: Goal: Level of anxiety will decrease 07/08/2022 1706 by Georgiann Mccoy, RN Outcome: Adequate for Discharge 07/08/2022 1057 by Georgiann Mccoy, RN Outcome: Progressing   Problem: Elimination: Goal: Will not experience complications related to bowel motility 07/08/2022 1706 by Georgiann Mccoy, RN Outcome: Adequate for Discharge 07/08/2022 1057 by Georgiann Mccoy, RN Outcome: Progressing Goal: Will not experience complications related to urinary retention 07/08/2022 1706 by Georgiann Mccoy, RN Outcome: Adequate for Discharge 07/08/2022 1057 by Georgiann Mccoy, RN Outcome: Progressing   Problem: Pain Managment: Goal: General experience of comfort will improve 07/08/2022 1706 by Georgiann Mccoy, RN Outcome: Adequate for Discharge 07/08/2022 1057 by Georgiann Mccoy, RN Outcome: Progressing   Problem: Safety: Goal: Ability to remain free from injury will improve 07/08/2022 1706 by Georgiann Mccoy, RN Outcome: Adequate for Discharge 07/08/2022 1057 by Georgiann Mccoy, RN Outcome: Progressing   Problem: Skin Integrity: Goal: Risk for impaired skin integrity will decrease 07/08/2022 1706 by Georgiann Mccoy, RN Outcome: Adequate for Discharge 07/08/2022 1057 by Georgiann Mccoy, RN Outcome: Progressing   Problem: Education: Goal: Ability to demonstrate management of disease process will improve 07/08/2022 1706 by Georgiann Mccoy, RN Outcome: Adequate for Discharge 07/08/2022 1057 by Georgiann Mccoy, RN Outcome: Progressing Goal: Ability to verbalize understanding of medication therapies will improve 07/08/2022 1706 by Georgiann Mccoy, RN Outcome: Adequate for Discharge 07/08/2022 1057 by Georgiann Mccoy, RN Outcome: Progressing Goal: Individualized Educational Video(s) 07/08/2022 1706 by Georgiann Mccoy, RN Outcome: Adequate for Discharge 07/08/2022 1057 by Georgiann Mccoy, RN Outcome: Progressing   Problem: Activity: Goal: Capacity to  carry out activities will improve 07/08/2022 1706 by Georgiann Mccoy, RN Outcome: Adequate for Discharge 07/08/2022 1057 by Georgiann Mccoy, RN Outcome: Progressing   Problem: Cardiac: Goal: Ability to achieve and maintain adequate cardiopulmonary perfusion  will improve 07/08/2022 1706 by Georgiann Mccoy, RN Outcome: Adequate for Discharge 07/08/2022 1057 by Georgiann Mccoy, RN Outcome: Progressing

## 2022-07-08 NOTE — Progress Notes (Signed)
[facility] PROGRESS NOTE   Marie Hunt:[id number] DOB: 1932-07-28 DOA: 07/02/2022  PCP: Kirstie Peri, MD  Brief History/Interval Summary: 89 y.o. female with medical history significant for CAD, hypertension, GERD, type 2 diabetes mellitus, thyroid disease, hyperlipidemia, and more presented to the ED with a chief complaint of dyspnea.  Imaging studies raise concern for CHF versus pneumonia.  She was hospitalized for further management.    Consultants: Cardiology  Procedures: Transthoracic echocardiogram    Subjective/Interval History: Patient feels well.  No complaints offered today.  Her daughter is at the bedside.    Assessment/Plan:  Acute systolic CHF Echocardiogram shows EF 30 to 35% with regional wall motion abnormalities.  Grade 1 diastolic dysfunction was also noted.  Moderate aortic stenosis also appreciated. Cardiology is following.  Medical management is being pursued considering her advanced age and comorbidities.   Cannot use ACE inhibitor ARB due to chronic kidney disease. Patient was placed on furosemide infusion which was discontinued on 5/11. She diuresed well.  Continue strict ins and outs and daily weight. Patient noted to be on nitrates and hydralazine. Lower extremity Doppler studies were negative for DVT. Mildly elevated troponin likely suggestive of demand ischemia rather than an acute coronary syndrome.   Telemetry shows PVCs and occasional SVTs.  Patient was started on metoprolol yesterday. Creatinine noted to be higher than her baseline but stable this morning compared to yesterday. Wait on further cardiology input.  Acute respiratory failure with hypoxia, resolved Does not typically use oxygen at home.  Respiratory failure is thought to be due to pulmonary edema/CHF. She was initially requiring 12 L of oxygen by high flow nasal cannula.  Patient was placed on IV furosemide infusion with which he diuresed well.  She was weaned off of  oxygen.   Initially placed on empiric antibiotics due to concern for pneumonia.  Procalcitonin 0.32.  COVID-19 PCR was negative.  Changed over to doxycycline due to prolonged QT.  She has completed course of antibiotics.  Acute on Chronic kidney disease stage 4 Baseline creatinine seems to be between 2.0-2.5.  Anticipate some worsening in creatinine with the diuretic therapy.  Patient with advanced age, CKD stage IV now with systolic CHF.  She does not appear to be a candidate for hemodialysis.  This was discussed with patient and her granddaughter.  Palliative care is also following.   Creatinine 2.72 yesterday and noted to be same this morning.  Monitor urine output.  Prolonged QT interval Monitor on telemetry.  Hypothyroidism Levothyroxine being continued.  TSH noted to be quite elevated at 14.32.  Free T4 is noted to be 1.04.  Dose of levothyroxine was increased.  Would recommend thyroid function test be rechecked in 3 to 4 weeks.  Diabetes mellitus type 2, uncontrolled with hyperglycemia HbA1c 7.7.  Continue SSI.  Holding glimepiride.  CBGs are reasonably well-controlled.  Anemia of chronic kidney disease Lower than her usual baseline.  Last year her hemoglobin was 11.5.   Anemia panel reviewed.  Ferritin 430, iron 12, TIBC 196, percent saturation 6.  B12 2753 and folate greater than 40.   Drop in hemoglobin is likely due to fluid shift.  Hemoglobin has been stable.  No evidence of overt bleeding.  Hyperlipidemia Continue statin.  Essential hypertension Cardiology has made changes to antihypertensives.  Amlodipine and metoprolol were discontinued.  Currently on metoprolol, hydralazine and nitrates.  Blood pressure is reasonably well-controlled.   GERD (gastroesophageal reflux disease) Continue Pepcid   Goals of care Palliative Care  is following.  Currently full code.  DVT Prophylaxis: Subcutaneous heparin Code Status: Full code Family Communication: Discussed with  granddaughter and patient Disposition Plan: Home health as recommended by PT and OT.      Medications: Scheduled:  aspirin EC  81 mg Oral QPM   famotidine  20 mg Oral QHS   feeding supplement (GLUCERNA SHAKE)  237 mL Oral BID BM   heparin  5,000 Units Subcutaneous Q8H   hydrALAZINE  25 mg Oral Q8H   insulin aspart  0-9 Units Subcutaneous TID WC   isosorbide mononitrate  15 mg Oral Daily   levothyroxine  37.5 mcg Oral Q0600   melatonin  6 mg Oral QPM   metoprolol succinate  12.5 mg Oral Daily   simvastatin  20 mg Oral QHS   traZODone  50 mg Oral QHS   Continuous:   Hunt:[id number] **OR** acetaminophen, guaiFENesin-dextromethorphan, LORazepam, oxyCODONE, polyethylene glycol  Antibiotics: Anti-infectives (From admission, onward)    Start     Dose/Rate Route Frequency Ordered Stop   07/04/22 1030  doxycycline (VIBRA-TABS) tablet 100 mg        100 mg Oral Every 12 hours 07/04/22 0944 07/07/22 2119   07/03/22 1800  cefTRIAXone (ROCEPHIN) 1 g in sodium chloride 0.9 % 100 mL IVPB  Status:  Discontinued        1 g 200 mL/hr over 30 Minutes Intravenous Every 24 hours 07/02/22 2139 07/04/22 0944   07/03/22 1800  azithromycin (ZITHROMAX) 500 mg in sodium chloride 0.9 % 250 mL IVPB  Status:  Discontinued        500 mg 250 mL/hr over 60 Minutes Intravenous Every 24 hours 07/02/22 2139 07/04/22 0944   07/02/22 1745  cefTRIAXone (ROCEPHIN) 1 g in sodium chloride 0.9 % 100 mL IVPB        1 g 200 mL/hr over 30 Minutes Intravenous  Once 07/02/22 1734 07/02/22 1920   07/02/22 1745  azithromycin (ZITHROMAX) 500 mg in sodium chloride 0.9 % 250 mL IVPB        500 mg 250 mL/hr over 60 Minutes Intravenous  Once 07/02/22 1734 07/02/22 2057       Objective:  Vital Signs  Vitals:   07/07/22 1258 07/07/22 2043 07/08/22 0628 07/08/22 0839  BP: (!) 119/59 (!) 126/55 (!) 124/54 (!) 123/53  Pulse: 68 62 68 65  Resp: 15 18 16 17   Temp: 98.6 F (37 C) 98.2 F (36.8 C) 98.2 F (36.8 C) 98.6  F (37 C)  TempSrc: Oral Oral Oral Oral  SpO2:   98%   Weight:   58.3 kg   Height:        Intake/Output Summary (Last 24 hours) at 07/08/2022 0942 Last data filed at 07/07/2022 2137 Gross per 24 hour  Intake --  Output 420 ml  Net -420 ml    Filed Weights   07/06/22 0614 07/07/22 0441 07/08/22 0628  Weight: 58.3 kg 58.2 kg 58.3 kg    General appearance: Awake alert.  In no distress Resp: Good air entry bilaterally.  Few crackles at the bases.  No wheezing or rhonchi. Cardio: S1-S2 is normal regular.  No S3-S4.  No rubs murmurs or bruit GI: Abdomen is soft.  Nontender nondistended.  Bowel sounds are present normal.  No masses organomegaly Extremities: Improved edema bilateral lower extremities   Lab Results:  Data Reviewed: I have personally reviewed following labs and reports of the imaging studies  CBC: Recent Labs  Lab 07/02/22 1555 07/03/22 0418 07/04/22  2956 07/05/22 0212 07/06/22 0239  WBC 7.5 9.8 6.8 6.6 6.7  NEUTROABS 5.1 7.3  --   --   --   HGB 8.4* 8.9* 7.8* 7.7* 8.2*  HCT 27.2* 27.7* 24.7* 25.0* 26.8*  MCV 86.1 82.4 84.3 84.2 84.8  PLT 195 236 204 231 263     Basic Metabolic Panel: Recent Labs  Lab 07/03/22 0418 07/04/22 0223 07/05/22 0212 07/06/22 0239 07/07/22 0607 07/08/22 0834  NA 136 139 136 136 135 137  K 4.4 3.9 3.6 3.9 3.9 4.2  CL 105 102 100 100 102 102  CO2 20* 23 26 26 26 26   GLUCOSE 202* 200* 129* 164* 135* 166*  BUN 50* 48* 47* 45* 44* 46*  CREATININE 2.31* 2.37* 2.43* 2.60* 2.72* 2.72*  CALCIUM 9.3 8.9 8.7* 8.9 9.0 9.1  MG 2.2  --   --   --   --   --      GFR: Estimated Creatinine Clearance: 12.6 mL/min (A) (by C-G formula based on SCr of 2.72 mg/dL (H)).  Liver Function Tests: Recent Labs  Lab 07/02/22 1555 07/03/22 0418  AST 27 29  ALT 20 21  ALKPHOS 130* 138*  BILITOT 0.5 0.5  PROT 7.0 6.9  ALBUMIN 2.7* 2.6*     CBG: Recent Labs  Lab 07/07/22 0900 07/07/22 1304 07/07/22 1552 07/07/22 2111  07/08/22 0840  GLUCAP 146* 166* 117* 160* 159*     Recent Results (from the past 240 hour(s))  SARS Coronavirus 2 by RT PCR (hospital order, performed in [facility] hospital lab) *cepheid single result test* Anterior Nasal Swab     Status: None   Collection Time: 07/02/22  5:45 PM   Specimen: Anterior Nasal Swab  Result Value Ref Range Status   SARS Coronavirus 2 by RT PCR NEGATIVE NEGATIVE Final    Comment: (NOTE) SARS-CoV-2 target nucleic acids are NOT DETECTED.  The SARS-CoV-2 RNA is generally detectable in upper and lower respiratory specimens during the acute phase of infection. The lowest concentration of SARS-CoV-2 viral copies this assay can detect is 250 copies / mL. A negative result does not preclude SARS-CoV-2 infection and should not be used as the sole basis for treatment or other patient management decisions.  A negative result may occur with improper specimen collection / handling, submission of specimen other than nasopharyngeal swab, presence of viral mutation(s) within the areas targeted by this assay, and inadequate number of viral copies (<250 copies / mL). A negative result must be combined with clinical observations, patient history, and epidemiological information.  Fact Sheet for Patients:   RoadLapTop.co.za  Fact Sheet for Healthcare Providers: http://kim-miller.com/  This test is not yet approved or  cleared by the [place] FDA and has been authorized for detection and/or diagnosis of SARS-CoV-2 by FDA under an Emergency Use Authorization (EUA).  This EUA will remain in effect (meaning this test can be used) for the duration of the COVID-19 declaration under Section 564(b)(1) of the Act, 21 U.S.C. section 360bbb-3(b)(1), unless the authorization is terminated or revoked sooner.  Performed at [facility], [address] [place], [place] [address]       Radiology Studies: No results  found.     LOS: 5 days   Santoria Chason [employer] on [email or web address]  07/08/2022, 9:42 AM

## 2022-07-08 NOTE — Progress Notes (Addendum)
Rounding Note    Patient Name: Marie Hunt Date of Encounter: 07/08/2022  [facility] Cardiologist: Meriam Sprague, MD   Subjective   Patient continues to feel well. Per daughter at bedside, she still experiences notable exertional dyspnea (ex: when ambulating to bedside toilet this morning). Patient remains pleased with volume reduction from admission.  Inpatient Medications    Scheduled Meds:  aspirin EC  81 mg Oral QPM   famotidine  20 mg Oral QHS   feeding supplement (GLUCERNA SHAKE)  237 mL Oral BID BM   heparin  5,000 Units Subcutaneous Q8H   hydrALAZINE  25 mg Oral Q8H   insulin aspart  0-9 Units Subcutaneous TID WC   isosorbide mononitrate  15 mg Oral Daily   levothyroxine  37.5 mcg Oral Q0600   melatonin  6 mg Oral QPM   metoprolol succinate  12.5 mg Oral Daily   simvastatin  20 mg Oral QHS   traZODone  50 mg Oral QHS   Continuous Infusions:  PRN Meds: acetaminophen **OR** acetaminophen, guaiFENesin-dextromethorphan, LORazepam, oxyCODONE, polyethylene glycol   Vital Signs    Vitals:   07/07/22 0955 07/07/22 1258 07/07/22 2043 07/08/22 0628  BP: 119/73 (!) 119/59 (!) 126/55 (!) 124/54  Pulse:  68 62 68  Resp: 16 15 18 16   Temp:  98.6 F (37 C) 98.2 F (36.8 C) 98.2 F (36.8 C)  TempSrc:  Oral Oral Oral  SpO2:    98%  Weight:    58.3 kg  Height:        Intake/Output Summary (Last 24 hours) at 07/08/2022 0755 Last data filed at 07/07/2022 2137 Gross per 24 hour  Intake --  Output 420 ml  Net -420 ml      07/08/2022    6:28 AM 07/07/2022    4:41 AM 07/06/2022    6:14 AM  Last 3 Weights  Weight (lbs) 128 lb 8.5 oz 128 lb 4.9 oz 128 lb 8.5 oz  Weight (kg) 58.3 kg 58.2 kg 58.3 kg      Telemetry    Sinus rhythm, PACs - Personally Reviewed  ECG    NSR without acute ischemic changes - Personally Reviewed  Physical Exam   GEN: No acute distress.   Neck: No JVD Cardiac: regular rate and rhythm. Harsh systolic murmur  LUSB Respiratory: Clear to auscultation bilaterally. GI: Soft, nontender, non-distended  MS: No edema; No deformity. Neuro:  Nonfocal  Psych: Normal affect   Labs    High Sensitivity Troponin:   Recent Labs  Lab 07/02/22 1555 07/02/22 1853  TROPONINIHS 202* 198*     Chemistry Recent Labs  Lab 07/02/22 1555 07/02/22 1938 07/03/22 0418 07/04/22 0223 07/05/22 0212 07/06/22 0239 07/07/22 0607  NA 134*   < > 136   < > 136 136 135  K 3.7   < > 4.4   < > 3.6 3.9 3.9  CL 102   < > 105   < > 100 100 102  CO2 22   < > 20*   < > 26 26 26   GLUCOSE 211*   < > 202*   < > 129* 164* 135*  BUN 54*   < > 50*   < > 47* 45* 44*  CREATININE 2.37*   < > 2.31*   < > 2.43* 2.60* 2.72*  CALCIUM 8.9   < > 9.3   < > 8.7* 8.9 9.0  MG  --   --  2.2  --   --   --   --  PROT 7.0  --  6.9  --   --   --   --   ALBUMIN 2.7*  --  2.6*  --   --   --   --   AST 27  --  29  --   --   --   --   ALT 20  --  21  --   --   --   --   ALKPHOS 130*  --  138*  --   --   --   --   BILITOT 0.5  --  0.5  --   --   --   --   GFRNONAA 19*   < > 20*   < > 19* 17* 16*  ANIONGAP 10   < > 11   < > 10 10 7    < > = values in this interval not displayed.    Lipids No results for input(s): "CHOL", "TRIG", "HDL", "LABVLDL", "LDLCALC", "CHOLHDL" in the last 168 hours.  Hematology Recent Labs  Lab 07/04/22 0223 07/05/22 0212 07/06/22 0239  WBC 6.8 6.6 6.7  RBC 2.93*  2.97* 2.97* 3.16*  HGB 7.8* 7.7* 8.2*  HCT 24.7* 25.0* 26.8*  MCV 84.3 84.2 84.8  MCH 26.6 25.9* 25.9*  MCHC 31.6 30.8 30.6  RDW 16.7* 16.7* 16.8*  PLT 204 231 263   Thyroid  Recent Labs  Lab 07/03/22 0418 07/04/22 0223  TSH 14.326*  --   FREET4  --  1.04    BNP Recent Labs  Lab 07/02/22 1555  BNP 1,307.0*    DDimer No results for input(s): "DDIMER" in the last 168 hours.   Radiology    No results found.  Cardiac Studies   07/03/22 TTE   IMPRESSIONS     1. Inferior hypokinesis septal and apical akinesis . Left ventricular   ejection fraction, by estimation, is 30 to 35%. The left ventricle has  moderately decreased function. The left ventricle demonstrates regional  wall motion abnormalities (see scoring  diagram/findings for description). The left ventricular internal cavity  size was moderately dilated. There is mild asymmetric left ventricular  hypertrophy of the basal and septal segments. Left ventricular diastolic  parameters are consistent with Grade I   diastolic dysfunction (impaired relaxation).   2. Right ventricular systolic function is normal. The right ventricular  size is normal.   3. The mitral valve is abnormal. Mild mitral valve regurgitation. No  evidence of mitral stenosis. Moderate mitral annular calcification.   4. The aortic valve is normal in structure. There is moderate  calcification of the aortic valve. There is moderate thickening of the  aortic valve. Aortic valve regurgitation is mild. Mild to moderate aortic  valve stenosis.   5. The inferior vena cava is normal in size with greater than 50%  respiratory variability, suggesting right atrial pressure of 3 mmHg.   FINDINGS   Left Ventricle: Inferior hypokinesis septal and apical akinesis. Left  ventricular ejection fraction, by estimation, is 30 to 35%. The left  ventricle has moderately decreased function. The left ventricle  demonstrates regional wall motion abnormalities.  The left ventricular internal cavity size was moderately dilated. There is  mild asymmetric left ventricular hypertrophy of the basal and septal  segments. Left ventricular diastolic parameters are consistent with Grade  I diastolic dysfunction (impaired  relaxation).   Right Ventricle: The right ventricular size is normal. No increase in  right ventricular wall thickness. Right ventricular systolic function is  normal.  Left Atrium: Left atrial size was normal in size.   Right Atrium: Right atrial size was normal in size.   Pericardium: There is  no evidence of pericardial effusion.   Mitral Valve: The mitral valve is abnormal. There is mild thickening of  the mitral valve leaflet(s). There is mild calcification of the mitral  valve leaflet(s). Moderate mitral annular calcification. Mild mitral valve  regurgitation. No evidence of mitral   valve stenosis.   Tricuspid Valve: The tricuspid valve is normal in structure. Tricuspid  valve regurgitation is mild . No evidence of tricuspid stenosis.   Aortic Valve: The aortic valve is normal in structure. There is moderate  calcification of the aortic valve. There is moderate thickening of the  aortic valve. Aortic valve regurgitation is mild. Aortic regurgitation PHT  measures 373 msec. Mild to moderate   aortic stenosis is present. Aortic valve mean gradient measures 13.5  mmHg. Aortic valve peak gradient measures 23.3 mmHg. Aortic valve area, by  VTI measures 1.77 cm.   Pulmonic Valve: The pulmonic valve was normal in structure. Pulmonic valve  regurgitation is not visualized. No evidence of pulmonic stenosis.   Aorta: The aortic root is normal in size and structure.   Venous: The inferior vena cava is normal in size with greater than 50%  respiratory variability, suggesting right atrial pressure of 3 mmHg.   IAS/Shunts: The interatrial septum was not well visualized.   Patient Profile    89 y.o. female with a PMH significant for CAS s/p left CEA 2012, HTN, HLD, CKD IV, DM2, and hypothyroidism who presented with acute heart failure - echo showing LVEF 30-35% with WMA.   Assessment & Plan    Acute systolic and diastolic heart failure    Patient admitted with symptoms of acute CHF. Found with BNP 1307. TTE showed LVEF 30-35% with regional wall motion abnormalities.    Patient now net negative 7.3L. Weight down 63.2kg -> 58.3kg. Creatinine stable, will restart PO lasix at 40 mg daily. GDMT limited by CKD. Continue Hydralazine and Imdur for afterload reduction. Continue  Toprol XL, at decreased dose of 12.5mg .    Aortic stenosis   TTE showed mild to moderate aortic stenosis is present. Aortic valve mean gradient measures 13.5 mmHg. Aortic valve peak gradient measures 23.3 mmHg. Aortic valve area, by VTI measures 1.77 cm.    Likely driving some of patient's exertional dyspnea.  Continue afterload reduction as above. Will continue to follow in outpatient setting.   AKI on CKD IV   Creatinine rising, 2.43->2.6->2.72>2.72. Will restart PO lasix 40 mg daily   Hypertension   BP stable, continue hydralazine 25mg  Q8hr, Imdur 15mg , Metoprolol Succinate 12.5mg  QD.   Elevated troponin   Troponin 202->198 this admission. Suspect demand ischemia/type II MI with acute congestive heart failure with underlying CKD IV. Patient would be a poor candidate for ischemic evaluation with CKD.   Hyperlipidemia   Continue home statin.    Per primary team: Thyroid disease Anemia DM     [facility] will sign off.   Medication Recommendations: Change Lasix to 40 mg daily.  Recommend checking daily weights and call if gains more than 3 pounds in 1 day or 5 pounds in 1 week.  Continue hydralazine, imdur, toprol XL. Other recommendations (labs, testing, etc): BMET within 1 week Follow up as an outpatient: We will schedule   For questions or updates, please contact  [facility] Please consult [email or web address] for contact info under  Con Memos, PA-C  07/08/2022, 7:55 AM    Patient seen and examined.  Agree with above documentation.  On exam, patient is alert and oriented, regular rate and rhythm, 2/6 systolic murmur, lungs CTAB, no LE edema.  Creatinine stable at 2.7 today.  Will add back p.o. Lasix.  Was taking 20 mg twice daily at home but suspect dose was too low for adequate diuresis given her renal function.  Would recommend taking Lasix 40 mg daily.  Recommend checking daily weights and call if gains more than 3 pounds in 1 day or  5 pounds in 1 week.  Check BMET within 1 week  Little Ishikawa, MD

## 2022-07-08 NOTE — Plan of Care (Signed)

## 2022-07-08 NOTE — Progress Notes (Signed)
Heart Failure Navigator Progress Note  Assessed for Heart & Vascular TOC clinic readiness.  Patient EF 30-35% with decreased in Functional status, has been consulted with Palliative care. A [facility] appointment has been scheduled for 07/17/2022. .   Navigator will sign off at this time.   Rhae Hammock, BSN, Scientist, clinical (histocompatibility and immunogenetics) Only

## 2022-07-08 NOTE — Progress Notes (Signed)
Patient ID: ROREY KNILL, female   DOB: 11/07/1932, 89 y.o.   MRN: [id number]    Progress Note from the Palliative Medicine Team at [facility]   Patient Name: Marie Hunt        Date: 07/08/2022 DOB: 01-25-1933  Age: 89 y.o. MRN#: [id number] Attending Physician: Osvaldo Shipper, MD Primary Care Physician: Kirstie Peri, MD Admit Date: 07/02/2022   Medical records reviewed, assessed patient, discussed with treatment team  89 y.o. female with medical history significant for CAD, hypertension, GERD, type 2 diabetes mellitus, thyroid disease, hyperlipidemia, and more presented to the ED with a chief complaint of dyspnea.  Imaging studies raise concern for CHF versus pneumonia.  She was hospitalized for further management.     Palliative care has been asked to get involved for goals of care conversations in the setting of advanced heart failure.  Initial consult 07/04/2022     This NP assessed patient at the bedside as a follow up for palliative medicine needs and emotional support.  Patient is alert and oriented x 3 and sitting out of bed in chair.  I introduced myself as a provider with the palliative medicine team, and reinforced the role of palliative medicine and holistic treatment team.   Spoke with granddaughter / Jeri Modena at bedside.   Continue conversation regarding patient's current medical situation and the importance of ongoing conversation regarding treatment option decisions, advanced directive decisions and anticipatory care needs.  Family understand increasing care needs and plan to provide 24-hour care for patient with family members and out-of-pocket caregivers.  Education offered on hospice benefit; philosophy and eligibility  Again discussed MOST form and the importance of continued conversation with family and the medical providers regarding overall plan of care and treatment options,  ensuring decisions are within the context of the patients values  and GOCs.  Declined completion of MOST form at this time.  Questions and concerns addressed  Anticipate discharge home in the next 24 to 48 hours   Time: 50 minutes  Detailed review of medical records ( labs, imaging, vital signs), medically appropriate exam ( MS, skin, resp)   discussed with treatment team, counseling and education to patient, family, staff, documenting clinical information, medication management, coordination of care    Lorinda Creed NP  Palliative Medicine Team Team Phone # [phone] Pager [phone]

## 2022-07-08 NOTE — Discharge Summary (Signed)
[facility]  Physician Discharge Summary   Patient ID: Marie Hunt MRN: [id number] DOB/AGE: 04/02/1932 89 y.o.  Admit date: 07/02/2022 Discharge date:   07/08/2022   PCP: Kirstie Peri, MD  DISCHARGE DIAGNOSES:      Acute on chronic combined systolic and diastolic CHF (congestive heart failure) (HCC)   AKI (acute kidney injury) (HCC) Acute respiratory failure with hypoxia (HCC), resolved   Hypertension   Hyperlipidemia with target low density lipoprotein (LDL) cholesterol less than 100 mg/dL   Cardiac murmur   Type 2 diabetes mellitus (HCC)   GERD (gastroesophageal reflux disease)   Thyroid disease   Prolonged QT interval   Elevated troponin    RECOMMENDATIONS FOR OUTPATIENT FOLLOW UP: Cardiology to arrange outpatient follow-up Thyroid function test to be rechecked in 3 to 4 weeks.    Home Health: PT and OT Equipment/Devices: Walker  CODE STATUS: Full code  DISCHARGE CONDITION: fair  Diet recommendation: Heart healthy  INITIAL HISTORY: 89 y.o. female with medical history significant for CAD, hypertension, GERD, type 2 diabetes mellitus, thyroid disease, hyperlipidemia, and more presented to the ED with a chief complaint of dyspnea.  Imaging studies raise concern for CHF versus pneumonia.  She was hospitalized for further management.     Consultants: Cardiology   Procedures: Transthoracic echocardiogram    HOSPITAL COURSE:   Acute systolic CHF Echocardiogram shows EF 30 to 35% with regional wall motion abnormalities.  Grade 1 diastolic dysfunction was also noted.  Moderate aortic stenosis also appreciated. Cardiology is following.  Medical management is being pursued considering her advanced age and comorbidities.   Cannot use ACE inhibitor ARB due to chronic kidney disease. Patient was placed on furosemide infusion which was discontinued on 5/11. She diuresed well.  Continue strict ins and outs and daily weight. Patient noted to be on nitrates  and hydralazine. Lower extremity Doppler studies were negative for DVT. Mildly elevated troponin likely suggestive of demand ischemia rather than an acute coronary syndrome.   Telemetry shows PVCs and occasional SVTs.  Patient was started on metoprolol yesterday. Creatinine noted to be higher than her baseline but stable this morning compared to yesterday. Cleared by cardiology for discharge.  Recommends that she takes her Lasix 40 mg daily at home.  They will arrange outpatient follow-up.   Acute respiratory failure with hypoxia, resolved Does not typically use oxygen at home.  Respiratory failure is thought to be due to pulmonary edema/CHF. She was initially requiring 12 L of oxygen by high flow nasal cannula.  Patient was placed on IV furosemide infusion with which he diuresed well.  She was weaned off of oxygen.   Initially placed on empiric antibiotics due to concern for pneumonia.  Procalcitonin 0.32.  COVID-19 PCR was negative.  Changed over to doxycycline due to prolonged QT.  She has completed course of antibiotics.   Acute on Chronic kidney disease stage 4 Baseline creatinine seems to be between 2.0-2.5.   Anticipate some worsening in creatinine with the diuretic therapy.  Patient with advanced age, CKD stage IV now with systolic CHF.  She does not appear to be a candidate for hemodialysis.  This was discussed with patient and her granddaughter.  Palliative care is also following.   Creatinine 2.72 yesterday and noted to be same this morning.  Likely has a new baseline now.   Prolonged QT interval Monitor on telemetry.   Hypothyroidism Levothyroxine being continued.  TSH noted to be quite elevated at 14.32.  Free T4 is noted to  be 1.04.  Dose of levothyroxine was increased.  Would recommend thyroid function test be rechecked in 3 to 4 weeks.   Diabetes mellitus type 2, uncontrolled with hyperglycemia HbA1c 7.7.     Anemia of chronic kidney disease Lower than her usual baseline.   Last year her hemoglobin was 11.5.   Anemia panel reviewed.  Ferritin 430, iron 12, TIBC 196, percent saturation 6.  B12 2753 and folate greater than 40.   Drop in hemoglobin is likely due to fluid shift.  Hemoglobin has been stable.  No evidence of overt bleeding.   Hyperlipidemia Continue statin.   Essential hypertension Cardiology has made changes to antihypertensives.  Amlodipine and metoprolol were discontinued.  Currently on metoprolol, hydralazine and nitrates.  Blood pressure is reasonably well-controlled.   GERD (gastroesophageal reflux disease) Continue Pepcid   Patient is stable.  Okay for discharge home today.  Home health has been ordered.     PERTINENT LABS:  The results of significant diagnostics from this hospitalization (including imaging, microbiology, ancillary and laboratory) are listed below for reference.    Microbiology: Recent Results (from the past 240 hour(s))  SARS Coronavirus 2 by RT PCR (hospital order, performed in [facility] hospital lab) *cepheid single result test* Anterior Nasal Swab     Status: None   Collection Time: 07/02/22  5:45 PM   Specimen: Anterior Nasal Swab  Result Value Ref Range Status   SARS Coronavirus 2 by RT PCR NEGATIVE NEGATIVE Final    Comment: (NOTE) SARS-CoV-2 target nucleic acids are NOT DETECTED.  The SARS-CoV-2 RNA is generally detectable in upper and lower respiratory specimens during the acute phase of infection. The lowest concentration of SARS-CoV-2 viral copies this assay can detect is 250 copies / mL. A negative result does not preclude SARS-CoV-2 infection and should not be used as the sole basis for treatment or other patient management decisions.  A negative result may occur with improper specimen collection / handling, submission of specimen other than nasopharyngeal swab, presence of viral mutation(s) within the areas targeted by this assay, and inadequate number of viral copies (<250 copies / mL). A  negative result must be combined with clinical observations, patient history, and epidemiological information.  Fact Sheet for Patients:   RoadLapTop.co.za  Fact Sheet for Healthcare Providers: http://kim-miller.com/  This test is not yet approved or  cleared by the [place] FDA and has been authorized for detection and/or diagnosis of SARS-CoV-2 by FDA under an Emergency Use Authorization (EUA).  This EUA will remain in effect (meaning this test can be used) for the duration of the COVID-19 declaration under Section 564(b)(1) of the Act, 21 U.S.C. section 360bbb-3(b)(1), unless the authorization is terminated or revoked sooner.  Performed at [facility], [address], [place], [place] [address]      Labs:   Basic Metabolic Panel: Recent Labs  Lab 07/03/22 0418 07/04/22 0223 07/05/22 0212 07/06/22 0239 07/07/22 0607 07/08/22 0834  NA 136 139 136 136 135 137  K 4.4 3.9 3.6 3.9 3.9 4.2  CL 105 102 100 100 102 102  CO2 20* 23 26 26 26 26   GLUCOSE 202* 200* 129* 164* 135* 166*  BUN 50* 48* 47* 45* 44* 46*  CREATININE 2.31* 2.37* 2.43* 2.60* 2.72* 2.72*  CALCIUM 9.3 8.9 8.7* 8.9 9.0 9.1  MG 2.2  --   --   --   --   --    Liver Function Tests: Recent Labs  Lab 07/02/22 1555 07/03/22 0418  AST 27 29  ALT 20 21  ALKPHOS 130* 138*  BILITOT 0.5 0.5  PROT 7.0 6.9  ALBUMIN 2.7* 2.6*    CBC: Recent Labs  Lab 07/02/22 1555 07/03/22 0418 07/04/22 0223 07/05/22 0212 07/06/22 0239  WBC 7.5 9.8 6.8 6.6 6.7  NEUTROABS 5.1 7.3  --   --   --   HGB 8.4* 8.9* 7.8* 7.7* 8.2*  HCT 27.2* 27.7* 24.7* 25.0* 26.8*  MCV 86.1 82.4 84.3 84.2 84.8  PLT 195 236 204 231 263    BNP: BNP (last 3 results) Recent Labs    07/02/22 1555  BNP 1,307.0*     CBG: Recent Labs  Lab 07/07/22 0900 07/07/22 1304 07/07/22 1552 07/07/22 2111 07/08/22 0840  GLUCAP 146* 166* 117* 160* 159*     IMAGING STUDIES VAS [place] LOWER  EXTREMITY VENOUS (DVT)  Result Date: 07/03/2022  Lower Venous DVT Study Patient Name:  Marie Hunt  Date of Exam:   07/03/2022 Medical Rec #: [id number]           Accession #:    [id number] Date of Birth: October 22, 1932           Patient Gender: F Patient Age:   49 years Exam Location:  [facility] Procedure:      VAS [place] LOWER EXTREMITY VENOUS (DVT) Referring Phys: Osvaldo Shipper --------------------------------------------------------------------------------  Indications: Swelling, and Edema.  Comparison Study: No recent studies. Performing Technologist: Marilynne Halsted RDMS, RVT  Examination Guidelines: A complete evaluation includes B-mode imaging, spectral Doppler, color Doppler, and power Doppler as needed of all accessible portions of each vessel. Bilateral testing is considered an integral part of a complete examination. Limited examinations for reoccurring indications may be performed as noted. The reflux portion of the exam is performed with the patient in reverse Trendelenburg.  +---------+---------------+---------+-----------+----------+--------------+ RIGHT    CompressibilityPhasicitySpontaneityPropertiesThrombus Aging +---------+---------------+---------+-----------+----------+--------------+ CFV      Full           Yes      Yes                                 +---------+---------------+---------+-----------+----------+--------------+ SFJ      Full                                                        +---------+---------------+---------+-----------+----------+--------------+ FV Prox  Full                                                        +---------+---------------+---------+-----------+----------+--------------+ FV Mid   Full                                                        +---------+---------------+---------+-----------+----------+--------------+ FV DistalFull                                                         +---------+---------------+---------+-----------+----------+--------------+  PFV      Full                                                        +---------+---------------+---------+-----------+----------+--------------+ POP      Full           Yes      Yes                                 +---------+---------------+---------+-----------+----------+--------------+ PTV      Full                                                        +---------+---------------+---------+-----------+----------+--------------+ PERO     Full                                                        +---------+---------------+---------+-----------+----------+--------------+   +---------+---------------+---------+-----------+----------+--------------+ LEFT     CompressibilityPhasicitySpontaneityPropertiesThrombus Aging +---------+---------------+---------+-----------+----------+--------------+ CFV      Full           Yes      Yes                                 +---------+---------------+---------+-----------+----------+--------------+ SFJ      Full                                                        +---------+---------------+---------+-----------+----------+--------------+ FV Prox  Full                                                        +---------+---------------+---------+-----------+----------+--------------+ FV Mid   Full                                                        +---------+---------------+---------+-----------+----------+--------------+ FV DistalFull                                                        +---------+---------------+---------+-----------+----------+--------------+ PFV      Full                                                        +---------+---------------+---------+-----------+----------+--------------+  POP      Full           Yes      Yes                                  +---------+---------------+---------+-----------+----------+--------------+ PTV      Full                                                        +---------+---------------+---------+-----------+----------+--------------+ PERO     Full                                                        +---------+---------------+---------+-----------+----------+--------------+     Summary: BILATERAL: - No evidence of deep vein thrombosis seen in the lower extremities, bilaterally. -No evidence of popliteal cyst, bilaterally.   *See table(s) above for measurements and observations. Electronically signed by Coral Else MD on 07/03/2022 at 9:49:23 PM.    Final    ECHOCARDIOGRAM COMPLETE  Result Date: 07/03/2022    ECHOCARDIOGRAM REPORT   Patient Name:   Marie Hunt Date of Exam: 07/03/2022 Medical Rec #:  [id number]          Height:       64.0 in Accession #:    [id number]         Weight:       139.3 lb Date of Birth:  [birth year]          BSA:          1.678 m Patient Age:    89 years           BP:           123/55 mmHg Patient Gender: F                  HR:           87 bpm. Exam Location:  Inpatient Procedure: 2D Echo, Cardiac Doppler and Color Doppler Indications:    CHF I50.9  History:        Patient has prior history of Echocardiogram examinations and                 Patient has no prior history of Echocardiogram examinations.                 CHF, CAD; Risk Factors:Hypertension, Diabetes and Dyslipidemia.  Sonographer:    Lucendia Herrlich Referring Phys: [id number] ASIA B ZIERLE-GHOSH  Sonographer Comments: Image acquisition challenging due to uncooperative patient. IMPRESSIONS  1. Inferior hypokinesis septal and apical akinesis . Left ventricular ejection fraction, by estimation, is 30 to 35%. The left ventricle has moderately decreased function. The left ventricle demonstrates regional wall motion abnormalities (see scoring diagram/findings for description). The left ventricular internal cavity size was  moderately dilated. There is mild asymmetric left ventricular hypertrophy of the basal and septal segments. Left ventricular diastolic parameters are consistent with Grade I  diastolic dysfunction (impaired relaxation).  2. Right ventricular systolic function is normal. The right ventricular size is normal.  3. The mitral valve is abnormal. Mild mitral valve  regurgitation. No evidence of mitral stenosis. Moderate mitral annular calcification.  4. The aortic valve is normal in structure. There is moderate calcification of the aortic valve. There is moderate thickening of the aortic valve. Aortic valve regurgitation is mild. Mild to moderate aortic valve stenosis.  5. The inferior vena cava is normal in size with greater than 50% respiratory variability, suggesting right atrial pressure of 3 mmHg. FINDINGS  Left Ventricle: Inferior hypokinesis septal and apical akinesis. Left ventricular ejection fraction, by estimation, is 30 to 35%. The left ventricle has moderately decreased function. The left ventricle demonstrates regional wall motion abnormalities. The left ventricular internal cavity size was moderately dilated. There is mild asymmetric left ventricular hypertrophy of the basal and septal segments. Left ventricular diastolic parameters are consistent with Grade I diastolic dysfunction (impaired relaxation). Right Ventricle: The right ventricular size is normal. No increase in right ventricular wall thickness. Right ventricular systolic function is normal. Left Atrium: Left atrial size was normal in size. Right Atrium: Right atrial size was normal in size. Pericardium: There is no evidence of pericardial effusion. Mitral Valve: The mitral valve is abnormal. There is mild thickening of the mitral valve leaflet(s). There is mild calcification of the mitral valve leaflet(s). Moderate mitral annular calcification. Mild mitral valve regurgitation. No evidence of mitral  valve stenosis. Tricuspid Valve: The tricuspid  valve is normal in structure. Tricuspid valve regurgitation is mild . No evidence of tricuspid stenosis. Aortic Valve: The aortic valve is normal in structure. There is moderate calcification of the aortic valve. There is moderate thickening of the aortic valve. Aortic valve regurgitation is mild. Aortic regurgitation PHT measures 373 msec. Mild to moderate  aortic stenosis is present. Aortic valve mean gradient measures 13.5 mmHg. Aortic valve peak gradient measures 23.3 mmHg. Aortic valve area, by VTI measures 1.77 cm. Pulmonic Valve: The pulmonic valve was normal in structure. Pulmonic valve regurgitation is not visualized. No evidence of pulmonic stenosis. Aorta: The aortic root is normal in size and structure. Venous: The inferior vena cava is normal in size with greater than 50% respiratory variability, suggesting right atrial pressure of 3 mmHg. IAS/Shunts: The interatrial septum was not well visualized.  LEFT VENTRICLE PLAX 2D LVIDd:         4.60 cm   Diastology LVIDs:         3.30 cm   LV e' medial:    8.38 cm/s LV PW:         0.90 cm   LV E/e' medial:  13.1 LV IVS:        1.10 cm   LV e' lateral:   11.10 cm/s LVOT diam:     2.20 cm   LV E/e' lateral: 9.9 LV SV:         85 LV SV Index:   51 LVOT Area:     3.80 cm  RIGHT VENTRICLE RV S prime:     12.30 cm/s TAPSE (M-mode): 2.0 cm LEFT ATRIUM             Index        RIGHT ATRIUM           Index LA diam:        3.60 cm 2.15 cm/m   RA Area:     13.35 cm LA Vol (A2C):   80.1 ml 47.74 ml/m  RA Volume:   31.85 ml  18.98 ml/m LA Vol (A4C):   84.8 ml 50.54 ml/m LA Biplane Vol: 90.0 ml 53.64 ml/m  AORTIC VALVE AV Area (Vmax):    1.85 cm AV Area (Vmean):   1.70 cm AV Area (VTI):     1.77 cm AV Vmax:           241.50 cm/s AV Vmean:          175.000 cm/s AV VTI:            0.482 m AV Peak Grad:      23.3 mmHg AV Mean Grad:      13.5 mmHg LVOT Vmax:         117.50 cm/s LVOT Vmean:        78.400 cm/s LVOT VTI:          0.224 m LVOT/AV VTI ratio: 0.47 AI PHT:             373 msec  AORTA Ao Root diam: 3.10 cm Ao Asc diam:  3.30 cm MITRAL VALVE                TRICUSPID VALVE MV Area (PHT): 4.80 cm     TR Peak grad:   46.8 mmHg MV Decel Time: 158 msec     TR Vmax:        342.00 cm/s MR Peak grad: 74.3 mmHg MR Vmax:      431.00 cm/s   SHUNTS MV E velocity: 110.00 cm/s  Systemic VTI:  0.22 m MV A velocity: 83.90 cm/s   Systemic Diam: 2.20 cm MV E/A ratio:  1.31 Charlton Haws MD Electronically signed by Charlton Haws MD Signature Date/Time: 07/03/2022/3:17:50 PM    Final    DG CHEST PORT 1 VIEW  Result Date: 07/03/2022 CLINICAL DATA:  Shortness of breath EXAM: PORTABLE CHEST 1 VIEW COMPARISON:  CXR 07/02/22 FINDINGS: Trace left pleural effusion. No pneumothorax. Unchanged cardiac and mediastinal contours. There are multifocal patchy airspace opacities in the right upper and lower lung fields, as well as the left upper lung field. No radiographically apparent displaced rib fractures. Visualized upper abdomen is unremarkable. IMPRESSION: Multifocal patchy airspace opacities in the right upper and lower lung fields, as well as the left upper lung field. FIndings are worrisome for infection. Electronically Signed   By: Lorenza Cambridge M.D.   On: 07/03/2022 09:07   DG Chest 2 View  Result Date: 07/02/2022 CLINICAL DATA:  Shortness of breath EXAM: CHEST - 2 VIEW COMPARISON:  X-ray 10/06/2014 FINDINGS: Hyperinflation. Diffuse interstitial changes. Tiny pleural effusions. Kyphotic x-ray obscures the apices. There is subtle opacity in the right upper lobe. Focal pneumonia or infiltrates possible. Recommend follow-up to confirm clearance. Enlarged heart with calcified aorta. Osteopenia IMPRESSION: Subtle opacity right upper lobe.  Possible acute infiltrate. Enlarged heart with interstitial changes. Question component edema. Tiny effusions Kyphotic x-ray obscures the apices Electronically Signed   By: Karen Kays M.D.   On: 07/02/2022 15:42    DISCHARGE EXAMINATION: See progress note  from earlier today  DISPOSITION: Home with family  Discharge Instructions     (HEART FAILURE PATIENTS) Call MD:  Anytime you have any of the following symptoms: 1) 3 pound weight gain in 24 hours or 5 pounds in 1 week 2) shortness of breath, with or without a dry hacking cough 3) swelling in the hands, feet or stomach 4) if you have to sleep on extra pillows at night in order to breathe.   Complete by: As directed    Call MD for:  difficulty breathing, headache or visual disturbances   Complete by: As directed  Call MD for:  extreme fatigue   Complete by: As directed    Call MD for:  persistant dizziness or light-headedness   Complete by: As directed    Call MD for:  persistant nausea and vomiting   Complete by: As directed    Call MD for:  severe uncontrolled pain   Complete by: As directed    Call MD for:  temperature >100.4   Complete by: As directed    Diet - low sodium heart healthy   Complete by: As directed    Discharge instructions   Complete by: As directed    Please take your medications as prescribed.  Cardiology will arrange outpatient follow-up and blood work.  You were cared for by a hospitalist during your hospital stay. If you have any questions about your discharge medications or the care you received while you were in the hospital after you are discharged, you can call the unit and asked to speak with the hospitalist on call if the hospitalist that took care of you is not available. Once you are discharged, your primary care physician will handle any further medical issues. Please note that NO REFILLS for any discharge medications will be authorized once you are discharged, as it is imperative that you return to your primary care physician (or establish a relationship with a primary care physician if you do not have one) for your aftercare needs so that they can reassess your need for medications and monitor your lab values. If you do not have a primary care physician,  you can call [phone] for a physician referral.   Increase activity slowly   Complete by: As directed           Allergies as of 07/08/2022       Reactions   Codeine Nausea And Vomiting        Medication List     STOP taking these medications    amLODipine 5 MG tablet Commonly known as: NORVASC       TAKE these medications    allopurinol 100 MG tablet Commonly known as: ZYLOPRIM Take 100 mg by mouth every morning.   aspirin EC 81 MG tablet Take 81 mg by mouth every evening.   diclofenac Sodium 1 % Gel Commonly known as: VOLTAREN Apply 2 g topically daily as needed (for pain).   diphenhydrAMINE 2 % cream Commonly known as: BENADRYL Apply 1 application  topically 3 (three) times daily as needed for itching (rash).   Dry Eye Relief Drops 0.2-0.2-1 % Soln Generic drug: Glycerin-Hypromellose-PEG 400 Apply 1 drop to eye daily as needed (dry eyes).   famotidine 20 MG tablet Commonly known as: PEPCID Take 20 mg by mouth at bedtime.   folic acid 400 MCG tablet Commonly known as: FOLVITE Take 400 mcg by mouth every morning.   furosemide 40 MG tablet Commonly known as: LASIX Take 1 tablet (40 mg total) by mouth daily. What changed:  when to take this reasons to take this   glimepiride 2 MG tablet Commonly known as: AMARYL Take 2 mg by mouth daily before breakfast.   Global Inject Ease Lancets 30G Misc See admin instructions.   hydrALAZINE 25 MG tablet Commonly known as: APRESOLINE Take 1 tablet (25 mg total) by mouth every 8 (eight) hours.   isosorbide mononitrate 30 MG 24 hr tablet Commonly known as: IMDUR Take 0.5 tablets (15 mg total) by mouth daily. Start taking on: Jul 09, 2022   levothyroxine 75 MCG tablet Commonly  known as: SYNTHROID Take 0.5 tablets (37.5 mcg total) by mouth daily at 6 (six) AM. Start taking on: Jul 09, 2022 What changed:  medication strength how much to take when to take this   Melatonin 10 MG Tabs Take 1 tablet  by mouth every evening.   metoprolol succinate 25 MG 24 hr tablet Commonly known as: TOPROL-XL Take 0.5 tablets (12.5 mg total) by mouth daily. Start taking on: Jul 09, 2022 What changed:  how much to take when to take this   OneTouch Verio test strip Generic drug: glucose blood 1 each daily.   PREVAGEN PO Take 1 tablet by mouth every evening.   simvastatin 20 MG tablet Commonly known as: ZOCOR Take 20 mg by mouth at bedtime.   traZODone 50 MG tablet Commonly known as: DESYREL Take 50 mg by mouth at bedtime.               Durable Medical Equipment  (From admission, onward)           Start     Ordered   07/07/22 1649  For home use only DME Walker rolling  Once       Comments: Confined to one level of the home.  Question Answer Comment  Walker: With 5 Inch Wheels   Patient needs a walker to treat with the following condition Acute respiratory failure (HCC)      07/07/22 1649              Follow-up Information     [facility] Follow up.   Specialty: Home Health Services Why: Home Health Physical Therapy-Occupational Therapy, Aide. office to call with visit times. Contact information: [address] [place] [place] [address] [phone]         [facility] Follow up.   Why: [facility] [employer] to be delivered to the room. Contact information: [address] [place] [address] [phone]         Kirstie Peri, MD. Schedule an appointment as soon as possible for a visit in 1 week(s).   Specialty: Internal Medicine Why: post hospitalization follow up Contact information: [address]  [place] [address] [phone]                 TOTAL DISCHARGE TIME: 35 minutes  Margareth Kanner Rito Ehrlich  [facility] Pager on [email or web address]  07/08/2022, 11:41 AM

## 2022-07-10 DIAGNOSIS — I2510 Atherosclerotic heart disease of native coronary artery without angina pectoris: Secondary | ICD-10-CM | POA: Diagnosis not present

## 2022-07-10 DIAGNOSIS — Z7982 Long term (current) use of aspirin: Secondary | ICD-10-CM | POA: Diagnosis not present

## 2022-07-10 DIAGNOSIS — I130 Hypertensive heart and chronic kidney disease with heart failure and stage 1 through stage 4 chronic kidney disease, or unspecified chronic kidney disease: Secondary | ICD-10-CM | POA: Diagnosis not present

## 2022-07-10 DIAGNOSIS — Z7984 Long term (current) use of oral hypoglycemic drugs: Secondary | ICD-10-CM | POA: Diagnosis not present

## 2022-07-10 DIAGNOSIS — E782 Mixed hyperlipidemia: Secondary | ICD-10-CM | POA: Diagnosis not present

## 2022-07-10 DIAGNOSIS — N184 Chronic kidney disease, stage 4 (severe): Secondary | ICD-10-CM | POA: Diagnosis not present

## 2022-07-10 DIAGNOSIS — Z96642 Presence of left artificial hip joint: Secondary | ICD-10-CM | POA: Diagnosis not present

## 2022-07-10 DIAGNOSIS — Z87440 Personal history of urinary (tract) infections: Secondary | ICD-10-CM | POA: Diagnosis not present

## 2022-07-10 DIAGNOSIS — K219 Gastro-esophageal reflux disease without esophagitis: Secondary | ICD-10-CM | POA: Diagnosis not present

## 2022-07-10 DIAGNOSIS — E1122 Type 2 diabetes mellitus with diabetic chronic kidney disease: Secondary | ICD-10-CM | POA: Diagnosis not present

## 2022-07-10 DIAGNOSIS — D631 Anemia in chronic kidney disease: Secondary | ICD-10-CM | POA: Diagnosis not present

## 2022-07-10 DIAGNOSIS — E079 Disorder of thyroid, unspecified: Secondary | ICD-10-CM | POA: Diagnosis not present

## 2022-07-10 DIAGNOSIS — I5043 Acute on chronic combined systolic (congestive) and diastolic (congestive) heart failure: Secondary | ICD-10-CM | POA: Diagnosis not present

## 2022-07-14 DIAGNOSIS — D631 Anemia in chronic kidney disease: Secondary | ICD-10-CM | POA: Diagnosis not present

## 2022-07-14 DIAGNOSIS — K219 Gastro-esophageal reflux disease without esophagitis: Secondary | ICD-10-CM | POA: Diagnosis not present

## 2022-07-14 DIAGNOSIS — E1122 Type 2 diabetes mellitus with diabetic chronic kidney disease: Secondary | ICD-10-CM | POA: Diagnosis not present

## 2022-07-14 DIAGNOSIS — I5043 Acute on chronic combined systolic (congestive) and diastolic (congestive) heart failure: Secondary | ICD-10-CM | POA: Diagnosis not present

## 2022-07-14 DIAGNOSIS — I130 Hypertensive heart and chronic kidney disease with heart failure and stage 1 through stage 4 chronic kidney disease, or unspecified chronic kidney disease: Secondary | ICD-10-CM | POA: Diagnosis not present

## 2022-07-14 DIAGNOSIS — N184 Chronic kidney disease, stage 4 (severe): Secondary | ICD-10-CM | POA: Diagnosis not present

## 2022-07-14 DIAGNOSIS — Z96642 Presence of left artificial hip joint: Secondary | ICD-10-CM | POA: Diagnosis not present

## 2022-07-14 DIAGNOSIS — E782 Mixed hyperlipidemia: Secondary | ICD-10-CM | POA: Diagnosis not present

## 2022-07-14 DIAGNOSIS — Z7984 Long term (current) use of oral hypoglycemic drugs: Secondary | ICD-10-CM | POA: Diagnosis not present

## 2022-07-14 DIAGNOSIS — Z7982 Long term (current) use of aspirin: Secondary | ICD-10-CM | POA: Diagnosis not present

## 2022-07-14 DIAGNOSIS — Z87440 Personal history of urinary (tract) infections: Secondary | ICD-10-CM | POA: Diagnosis not present

## 2022-07-14 DIAGNOSIS — I2510 Atherosclerotic heart disease of native coronary artery without angina pectoris: Secondary | ICD-10-CM | POA: Diagnosis not present

## 2022-07-14 DIAGNOSIS — E079 Disorder of thyroid, unspecified: Secondary | ICD-10-CM | POA: Diagnosis not present

## 2022-07-15 DIAGNOSIS — Z7982 Long term (current) use of aspirin: Secondary | ICD-10-CM | POA: Diagnosis not present

## 2022-07-15 DIAGNOSIS — Z96642 Presence of left artificial hip joint: Secondary | ICD-10-CM | POA: Diagnosis not present

## 2022-07-15 DIAGNOSIS — Z299 Encounter for prophylactic measures, unspecified: Secondary | ICD-10-CM | POA: Diagnosis not present

## 2022-07-15 DIAGNOSIS — E1122 Type 2 diabetes mellitus with diabetic chronic kidney disease: Secondary | ICD-10-CM | POA: Diagnosis not present

## 2022-07-15 DIAGNOSIS — D631 Anemia in chronic kidney disease: Secondary | ICD-10-CM | POA: Diagnosis not present

## 2022-07-15 DIAGNOSIS — E1165 Type 2 diabetes mellitus with hyperglycemia: Secondary | ICD-10-CM | POA: Diagnosis not present

## 2022-07-15 DIAGNOSIS — I5043 Acute on chronic combined systolic (congestive) and diastolic (congestive) heart failure: Secondary | ICD-10-CM | POA: Diagnosis not present

## 2022-07-15 DIAGNOSIS — Z87440 Personal history of urinary (tract) infections: Secondary | ICD-10-CM | POA: Diagnosis not present

## 2022-07-15 DIAGNOSIS — K219 Gastro-esophageal reflux disease without esophagitis: Secondary | ICD-10-CM | POA: Diagnosis not present

## 2022-07-15 DIAGNOSIS — I10 Essential (primary) hypertension: Secondary | ICD-10-CM | POA: Diagnosis not present

## 2022-07-15 DIAGNOSIS — N184 Chronic kidney disease, stage 4 (severe): Secondary | ICD-10-CM | POA: Diagnosis not present

## 2022-07-15 DIAGNOSIS — E079 Disorder of thyroid, unspecified: Secondary | ICD-10-CM | POA: Diagnosis not present

## 2022-07-15 DIAGNOSIS — Z7984 Long term (current) use of oral hypoglycemic drugs: Secondary | ICD-10-CM | POA: Diagnosis not present

## 2022-07-15 DIAGNOSIS — I130 Hypertensive heart and chronic kidney disease with heart failure and stage 1 through stage 4 chronic kidney disease, or unspecified chronic kidney disease: Secondary | ICD-10-CM | POA: Diagnosis not present

## 2022-07-15 DIAGNOSIS — I5021 Acute systolic (congestive) heart failure: Secondary | ICD-10-CM | POA: Diagnosis not present

## 2022-07-15 DIAGNOSIS — I2510 Atherosclerotic heart disease of native coronary artery without angina pectoris: Secondary | ICD-10-CM | POA: Diagnosis not present

## 2022-07-15 DIAGNOSIS — E782 Mixed hyperlipidemia: Secondary | ICD-10-CM | POA: Diagnosis not present

## 2022-07-15 NOTE — Progress Notes (Unsigned)
Cardiology Office Note:    Date:  07/17/2022   ID:  Marie, Hunt 01-15-1933, MRN [id number]  PCP:  Kirstie Peri, MD   [facility] Providers Cardiologist:  Meriam Sprague, MD     Referring MD: Kirstie Peri, MD   Chief Complaint: Hospital follow-up  History of Present Illness:    Marie Hunt is a very pleasant 89 y.o. female with a hx of carotid artery stenosis s/p left CEA 2012, hypertension, HLD, CKD, type 2 diabetes, and hypothyroidism  Previously followed by [facility] cardiology. Reported no significant cardiac history.  Echocardiogram 2020 noted normal LV and RV function.  Admission 5/8-5/14/24 with acute HFrEF. She presented with shortness of breath, orthopnea, and significant decrease in functional status. Daughter reported significant swelling in her lower extremities and unable to get out of bed.  Previously she was very functional and ambulated with a cane while able to complete all of her ADLs without assistance. Cardiology was consulted and in discussion with daughter patient has known history of congestive heart failure. It is unclear whether she has had preserved/reduced function. They reported nephrology was managing her Lasix. BNP was 1307. Hs troponin 202 >> 198. TTE 07/03/2022 revealed LVEF 30 to 35%, regional wall motion abnormalities, G1DD, normal RV, mild MR, mild AI, aortic valve calcification with mild to moderate AS with peak gradient 23.3 mmHg, AVA 1.77 cm.  AS felt to be contributing to some exertional dyspnea. She had diuresis of 7.3L during admission. GDMT limited by CKD. Creatinine stable at 2.7. Discharge medications include Lasix 40 mg daily, hydralazine, Imdur, Toprol XL.  Today, she is here with her granddaughter Marie Hunt for follow-up.  Reports she is feeling "fine" since hospital discharge.  Has a productive cough with yellowish sputum. Has bilateral LE edema that worsened yesterday.  She was up on her feet more yesterday. She denies shortness  of breath, dyspnea, chest pain, orthopnea, PND. Was living alone prior to hospitalization and since d/c has had family with her everyday. Continues to perform ADLs including cooking and cleaning. Plan is to potentially place her in long-term care, awaiting insurance. She walks with a pronged cane. She denies lightheadedness, presyncope, syncope. She denies specific concerns and reports compliance with all her medications.   Past Medical History:  Diagnosis Date   Anemia    Carotid artery disease (HCC)    1-39% RICA, patent LICA 7/12 - Dr. Edilia Bo   Cholelithiasis    Cholelithiasis    In need of cholecystectomy   Diabetes mellitus    Type II   Essential hypertension, benign    GERD (gastroesophageal reflux disease)    GERD (gastroesophageal reflux disease)    Mixed hyperlipidemia    Sigmoid diverticulosis    Sigmoid diverticulosis    Type 2 diabetes mellitus (HCC)     Past Surgical History:  Procedure Laterality Date   CAROTID ENDARTERECTOMY  08/24/2009   Left catroid endarterectomy   CATARACT EXTRACTION W/PHACO Left 08/17/2014   Procedure: CATARACT EXTRACTION PHACO AND INTRAOCULAR LENS PLACEMENT; CDE:  7.39;  Surgeon: Gemma Payor, MD;  Location: [facility];  Service: Ophthalmology;  Laterality: Left;   CATARACT EXTRACTION W/PHACO Right 10/02/2014   Procedure: CATARACT EXTRACTION PHACO AND INTRAOCULAR LENS PLACEMENT RIGHT EYE CDE=5.83;  Surgeon: Gemma Payor, MD;  Location: [facility];  Service: Ophthalmology;  Laterality: Right;   CYSTOSCOPY W/ RETROGRADES Bilateral 03/28/2019   Procedure: CYSTOSCOPY WITH BILATRAL RETROGRADE PYELOGRAM,;  Surgeon: Malen Gauze, MD;  Location: [facility];  Service: Urology;  Laterality: Bilateral;   EYE SURGERY     HIP ARTHROPLASTY Left 10/03/2014   Procedure: ARTHROPLASTY BIPOLAR HIP (HEMIARTHROPLASTY);  Surgeon: Darreld Mclean, MD;  Location: [facility];  Service: Orthopedics;  Laterality: Left;   Left carotid endarterectomy  7/11   Dr. Edilia Bo   TRANSURETHRAL  RESECTION OF BLADDER TUMOR N/A 03/28/2019   Procedure: TRANSURETHRAL RESECTION OF BLADDER TUMOR (TURBT);  Surgeon: Malen Gauze, MD;  Location: [facility];  Service: Urology;  Laterality: N/A;   URETEROSCOPY Left 03/28/2019   Procedure: DIAGNOSTIC URETEROSCOPY WITH LEFT URETERAL STENT PLACEMENT;  Surgeon: Malen Gauze, MD;  Location: [facility];  Service: Urology;  Laterality: Left;    Current Medications: Current Meds  Medication Sig   allopurinol (ZYLOPRIM) 100 MG tablet Take 100 mg by mouth every morning.    aspirin EC 81 MG tablet Take 81 mg by mouth every evening.    diclofenac Sodium (VOLTAREN) 1 % GEL Apply 2 g topically daily as needed (for pain).   diphenhydrAMINE (BENADRYL) 2 % cream Apply 1 application  topically 3 (three) times daily as needed for itching (rash).   famotidine (PEPCID) 20 MG tablet Take 20 mg by mouth at bedtime.    folic acid (FOLVITE) 400 MCG tablet Take 400 mcg by mouth every morning.    furosemide (LASIX) 40 MG tablet Take 1 tablet (40 mg total) by mouth daily.   glimepiride (AMARYL) 2 MG tablet Take 2 mg by mouth daily before breakfast.   Global Inject Ease Lancets 30G MISC See admin instructions.   Glycerin-Hypromellose-PEG 400 (DRY EYE RELIEF DROPS) 0.2-0.2-1 % SOLN Apply 1 drop to eye daily as needed (dry eyes).   hydrALAZINE (APRESOLINE) 25 MG tablet Take 1 tablet (25 mg total) by mouth every 8 (eight) hours.   isosorbide mononitrate (IMDUR) 30 MG 24 hr tablet Take 0.5 tablets (15 mg total) by mouth daily.   levothyroxine (SYNTHROID) 75 MCG tablet Take 0.5 tablets (37.5 mcg total) by mouth daily at 6 (six) AM.   Melatonin 10 MG TABS Take 1 tablet by mouth every evening.   metoprolol succinate (TOPROL-XL) 25 MG 24 hr tablet Take 0.5 tablets (12.5 mg total) by mouth daily.   ONETOUCH VERIO test strip 1 each daily.   simvastatin (ZOCOR) 20 MG tablet Take 20 mg by mouth at bedtime.   traZODone (DESYREL) 50 MG tablet Take 100 mg by mouth at bedtime.      Allergies:   Codeine   Social History   Socioeconomic History   Marital status: Widowed    Spouse name: Not on file   Number of children: Not on file   Years of education: Not on file   Highest education level: Not on file  Occupational History   Not on file  Tobacco Use   Smoking status: Former    Packs/day: 0.30    Years: 60.00    Additional pack years: 0.00    Total pack years: 18.00    Types: Cigarettes    Quit date: 10/27/2011    Years since quitting: 10.7   Smokeless tobacco: Never  Substance and Sexual Activity   Alcohol use: No    Alcohol/week: 0.0 standard drinks of alcohol   Drug use: No   Sexual activity: Never  Other Topics Concern   Not on file  Social History Narrative   Not on file   Social Determinants of Health   Financial Resource Strain: Not on file  Food Insecurity: Not on file  Transportation Needs: Not  on file  Physical Activity: Not on file  Stress: Not on file  Social Connections: Not on file     Family History: The patient's family history includes Cancer in her daughter; Coronary artery disease in an other family member; Diabetes in her father, mother, and sister; Heart attack in her daughter; Heart disease in her sister, sister, and sister; Hyperlipidemia in her sister; Hypertension in her sister; Other in her daughter.  ROS:   Please see the history of present illness.   + productive cough + bilateral LE edema All other systems reviewed and are negative.  Labs/Other Studies Reviewed:    The following studies were reviewed today:  Echo 07/03/22 1. Inferior hypokinesis septal and apical akinesis . Left ventricular  ejection fraction, by estimation, is 30 to 35%. The left ventricle has  moderately decreased function. The left ventricle demonstrates regional  wall motion abnormalities (see scoring  diagram/findings for description). The left ventricular internal cavity  size was moderately dilated. There is mild asymmetric left  ventricular  hypertrophy of the basal and septal segments. Left ventricular diastolic  parameters are consistent with Grade I   diastolic dysfunction (impaired relaxation).   2. Right ventricular systolic function is normal. The right ventricular  size is normal.   3. The mitral valve is abnormal. Mild mitral valve regurgitation. No  evidence of mitral stenosis. Moderate mitral annular calcification.   4. The aortic valve is normal in structure. There is moderate  calcification of the aortic valve. There is moderate thickening of the  aortic valve. Aortic valve regurgitation is mild. Mild to moderate aortic  valve stenosis.   5. The inferior vena cava is normal in size with greater than 50%  respiratory variability, suggesting right atrial pressure of 3 mmHg.    Recent Labs: 07/02/2022: B Natriuretic Peptide 1,307.0 07/03/2022: ALT 21; Magnesium 2.2; TSH 14.326 07/06/2022: Hemoglobin 8.2; Platelets 263 07/08/2022: BUN 46; Creatinine, Ser 2.72; Potassium 4.2; Sodium 137  Recent Lipid Panel No results found for: "CHOL", "TRIG", "HDL", "CHOLHDL", "VLDL", "LDLCALC", "LDLDIRECT"   Risk Assessment/Calculations:           Physical Exam:    VS:  BP (!) 118/42   Pulse 62 Comment: Glucose was 89 at home was 30  Ht 5\' 4"  (1.626 m)   Wt 125 lb (56.7 kg)   SpO2 97%   BMI 21.46 kg/m     Wt Readings from Last 3 Encounters:  07/17/22 125 lb (56.7 kg)  07/08/22 128 lb 8.5 oz (58.3 kg)  08/09/21 135 lb (61.2 kg)     GEN: Frail, elderly, chronically ill appearing in no acute distress HEENT: Normal NECK: No JVD; No carotid bruits CARDIAC: RRR, holosystolic murmur. No rubs, gallops RESPIRATORY:  Clear to auscultation without rales, wheezing or rhonchi  ABDOMEN: Soft, non-tender, non-distended MUSCULOSKELETAL:  No edema; No deformity. 2+ pedal pulses, equal bilaterally SKIN: Warm and dry NEUROLOGIC:  Alert and oriented x 3 PSYCHIATRIC:  Normal affect   EKG:  EKG is not ordered today.          Diagnoses:    1. Acute on chronic combined systolic and diastolic CHF (congestive heart failure) (HCC)   2. Primary hypertension   3. Occlusion of left carotid artery   4. Nonrheumatic aortic valve stenosis   5. CKD (chronic kidney disease) stage 4, GFR 15-29 ml/min (HCC)   6. Hypoglycemia   7. Bilateral leg edema    Assessment and Plan:     Acute on chronic combined  CHF: TTE 07/03/2022 with LVEF 30 to 35%, G1 DD, inferior hypokinesis septal and apical akinesis, mild LVH, normal RV size and function. She appears euvolemic on exam with the exception of bilateral non-pitting LE edema. Granddaughter reports this has worsened in the last day. Was more active yesterday at home. Weight is stable. Patient denies orthopnea, PND, shortness of breath, dyspnea, early satiety. Advised daily weight at home, limit fluids to 2L, low sodium diet, compression and elevation of lower extremities.  She had AKI on top of CKD during hospitalization. Will recheck BMP today. Continue Lasix at 40 mg daily unless renal function or electrolyte disturbance noted on lab work today. Continue metoprolol, hydralazine, Imdur. Notify us for weight gain or worsening symptoms prior to next appointment.   Leg edema: Bilateral non-pitting LE edema, worse in the last day per granddaughter. No shortness of breath, dyspnea, orthopnea, PND. Encouraged leg elevation and demonstrated appropriate height for effectiveness.  Given Rx for compression stockings. Will continue diuretic therapy at current dose due to CKD.   Aortic stenosis: Moderate AS noted on TTE with mean gradient 13.5 mmHg and peak gradient 23.3 mmHg.  Aortic valve area 1.77 cm.  AS felt to be contributing at least somewhat to dyspnea during hospitalization for [facility] CHF. She is feeling better since d/c and is resuming regular activities without concerning symptoms of dyspnea, chest discomfort, orthopnea, PND, presyncope, syncope. We will continue to monitor clinically  for now. Consider repeat echo in 6 months, sooner if clinically indicated.   Carotid artery disease: History of left CEA 2012. She is asymptomatic. Last carotid duplex 2018 with less than 40% right internal carotid artery stenosis, patent left carotid endarterectomy site with no evidence of restenosis. We will continue to follow clinically at this time.   CKD stage IV: AKI during admission for CHF.  SCr 2.72, GFR 16 on 07/08/2022, day of discharge felt to be stable. We will recheck today.  Hypertension: BP is well controlled. No s/s of orthostatic hypotension. Advised her to notify us if she has concerns with BP prior to next office visit.   Hypoglycemia: Granddaughter reported blood sugar was 30 this morning prior to breakfast.  We rechecked in the office and glucose was 89. She was asymptomatic.       Disposition: 3 months in [place] office  Medication Adjustments/Labs and Tests Ordered: Current medicines are reviewed at length with the patient today.  Concerns regarding medicines are outlined above.  Orders Placed This Encounter  Procedures   Basic Metabolic Panel (BMET)   CBC   No orders of the defined types were placed in this encounter.   Patient Instructions  Medication Instructions:   Your physician recommends that you continue on your current medications as directed. Please refer to the Current Medication list given to you today.   *If you need a refill on your cardiac medications before your next appointment, please call your pharmacy*   Lab Work:  TODAY!!!! BMET/CBC.  If you have labs (blood work) drawn today and your tests are completely normal, you will receive your results only by: MyChart Message (if you have MyChart) OR A paper copy in the mail If you have any lab test that is abnormal or we need to change your treatment, we will call you to review the results.   Testing/Procedures:  None ordered.   Follow-Up: At [facility], you and your health  needs are our priority.  As part of our continuing mission to provide you with exceptional  heart care, we have created designated Provider Care Teams.  These Care Teams include your primary Cardiologist (physician) and Advanced Practice Providers (APPs -  Physician Assistants and Nurse Practitioners) who all work together to provide you with the care you need, when you need it.  We recommend signing up for the patient portal called "MyChart".  Sign up information is provided on this After Visit Summary.  MyChart is used to connect with patients for Virtual Visits (Telemedicine).  Patients are able to view lab/test results, encounter notes, upcoming appointments, etc.  Non-urgent messages can be sent to your provider as well.   To learn more about what you can do with MyChart, go to [email or web address].    Your next appointment:   3 month(s)  Provider:   Sharlene Dory, NP    Other Instructions  Please refer to the script when getting compression hose.       Signed, Levi Aland, NP  07/17/2022 9:24 AM    [facility]

## 2022-07-17 ENCOUNTER — Telehealth: Payer: Self-pay | Admitting: Nurse Practitioner

## 2022-07-17 ENCOUNTER — Encounter: Payer: Self-pay | Admitting: Nurse Practitioner

## 2022-07-17 ENCOUNTER — Ambulatory Visit: Payer: 59 | Attending: Nurse Practitioner | Admitting: Nurse Practitioner

## 2022-07-17 VITALS — BP 118/42 | HR 62 | Ht 64.0 in | Wt 125.0 lb

## 2022-07-17 DIAGNOSIS — I350 Nonrheumatic aortic (valve) stenosis: Secondary | ICD-10-CM

## 2022-07-17 DIAGNOSIS — E162 Hypoglycemia, unspecified: Secondary | ICD-10-CM | POA: Diagnosis not present

## 2022-07-17 DIAGNOSIS — I5043 Acute on chronic combined systolic (congestive) and diastolic (congestive) heart failure: Secondary | ICD-10-CM

## 2022-07-17 DIAGNOSIS — N184 Chronic kidney disease, stage 4 (severe): Secondary | ICD-10-CM

## 2022-07-17 DIAGNOSIS — I6522 Occlusion and stenosis of left carotid artery: Secondary | ICD-10-CM | POA: Diagnosis not present

## 2022-07-17 DIAGNOSIS — I10 Essential (primary) hypertension: Secondary | ICD-10-CM | POA: Diagnosis not present

## 2022-07-17 DIAGNOSIS — R600 Localized edema: Secondary | ICD-10-CM | POA: Diagnosis not present

## 2022-07-17 LAB — BASIC METABOLIC PANEL
BUN/Creatinine Ratio: 16 (ref 12–28)
BUN: 60 mg/dL — ABNORMAL HIGH (ref 8–27)
CO2: 26 mmol/L (ref 20–29)
Calcium: 9.8 mg/dL (ref 8.7–10.3)
Chloride: 101 mmol/L (ref 96–106)
Creatinine, Ser: 3.75 mg/dL — ABNORMAL HIGH (ref 0.57–1.00)
Glucose: 64 mg/dL — ABNORMAL LOW (ref 70–99)
Potassium: 4.6 mmol/L (ref 3.5–5.2)
Sodium: 136 mmol/L (ref 134–144)
eGFR: 11 mL/min/{1.73_m2} — ABNORMAL LOW (ref >59)

## 2022-07-17 LAB — CBC
Hematocrit: 29.2 % — ABNORMAL LOW (ref 34.0–46.6)
Hemoglobin: 9.4 g/dL — ABNORMAL LOW (ref 11.1–15.9)
MCH: 26.3 pg — ABNORMAL LOW (ref 26.6–33.0)
MCHC: 32.2 g/dL (ref 31.5–35.7)
MCV: 82 fL (ref 79–97)
Platelets: 198 10*3/uL (ref 150–450)
RBC: 3.57 x10E6/uL — ABNORMAL LOW (ref 3.77–5.28)
RDW: 17.9 % — ABNORMAL HIGH (ref 11.7–15.4)
WBC: 8.2 10*3/uL (ref 3.4–10.8)

## 2022-07-17 NOTE — Patient Instructions (Signed)
Medication Instructions:   Your physician recommends that you continue on your current medications as directed. Please refer to the Current Medication list given to you today.   *If you need a refill on your cardiac medications before your next appointment, please call your pharmacy*   Lab Work:  TODAY!!!! BMET/CBC.  If you have labs (blood work) drawn today and your tests are completely normal, you will receive your results only by: MyChart Message (if you have MyChart) OR A paper copy in the mail If you have any lab test that is abnormal or we need to change your treatment, we will call you to review the results.   Testing/Procedures:  None ordered.   Follow-Up: At [facility], you and your health needs are our priority.  As part of our continuing mission to provide you with exceptional heart care, we have created designated Provider Care Teams.  These Care Teams include your primary Cardiologist (physician) and Advanced Practice Providers (APPs -  Physician Assistants and Nurse Practitioners) who all work together to provide you with the care you need, when you need it.  We recommend signing up for the patient portal called "MyChart".  Sign up information is provided on this After Visit Summary.  MyChart is used to connect with patients for Virtual Visits (Telemedicine).  Patients are able to view lab/test results, encounter notes, upcoming appointments, etc.  Non-urgent messages can be sent to your provider as well.   To learn more about what you can do with MyChart, go to [email or web address].    Your next appointment:   3 month(s)  Provider:   Sharlene Dory, NP    Other Instructions  Please refer to the script when getting compression hose.

## 2022-07-17 NOTE — Telephone Encounter (Signed)
Seen today for hospital follow-up.  Patient request to be seen in [place] for convenience for proximity to her home. Message routed to Drs. Mallipeddi and Pemberton for agreement to transfer.

## 2022-07-18 ENCOUNTER — Other Ambulatory Visit: Payer: Self-pay

## 2022-07-18 DIAGNOSIS — I5043 Acute on chronic combined systolic (congestive) and diastolic (congestive) heart failure: Secondary | ICD-10-CM | POA: Diagnosis not present

## 2022-07-18 DIAGNOSIS — Z87440 Personal history of urinary (tract) infections: Secondary | ICD-10-CM | POA: Diagnosis not present

## 2022-07-18 DIAGNOSIS — K219 Gastro-esophageal reflux disease without esophagitis: Secondary | ICD-10-CM | POA: Diagnosis not present

## 2022-07-18 DIAGNOSIS — Z7982 Long term (current) use of aspirin: Secondary | ICD-10-CM | POA: Diagnosis not present

## 2022-07-18 DIAGNOSIS — I2510 Atherosclerotic heart disease of native coronary artery without angina pectoris: Secondary | ICD-10-CM | POA: Diagnosis not present

## 2022-07-18 DIAGNOSIS — N184 Chronic kidney disease, stage 4 (severe): Secondary | ICD-10-CM | POA: Diagnosis not present

## 2022-07-18 DIAGNOSIS — E162 Hypoglycemia, unspecified: Secondary | ICD-10-CM

## 2022-07-18 DIAGNOSIS — Z7984 Long term (current) use of oral hypoglycemic drugs: Secondary | ICD-10-CM | POA: Diagnosis not present

## 2022-07-18 DIAGNOSIS — D631 Anemia in chronic kidney disease: Secondary | ICD-10-CM | POA: Diagnosis not present

## 2022-07-18 DIAGNOSIS — I350 Nonrheumatic aortic (valve) stenosis: Secondary | ICD-10-CM

## 2022-07-18 DIAGNOSIS — I130 Hypertensive heart and chronic kidney disease with heart failure and stage 1 through stage 4 chronic kidney disease, or unspecified chronic kidney disease: Secondary | ICD-10-CM | POA: Diagnosis not present

## 2022-07-18 DIAGNOSIS — E782 Mixed hyperlipidemia: Secondary | ICD-10-CM | POA: Diagnosis not present

## 2022-07-18 DIAGNOSIS — E1122 Type 2 diabetes mellitus with diabetic chronic kidney disease: Secondary | ICD-10-CM | POA: Diagnosis not present

## 2022-07-18 DIAGNOSIS — E079 Disorder of thyroid, unspecified: Secondary | ICD-10-CM | POA: Diagnosis not present

## 2022-07-18 DIAGNOSIS — R600 Localized edema: Secondary | ICD-10-CM

## 2022-07-18 DIAGNOSIS — Z96642 Presence of left artificial hip joint: Secondary | ICD-10-CM | POA: Diagnosis not present

## 2022-07-22 DIAGNOSIS — I10 Essential (primary) hypertension: Secondary | ICD-10-CM | POA: Diagnosis not present

## 2022-07-22 DIAGNOSIS — I2510 Atherosclerotic heart disease of native coronary artery without angina pectoris: Secondary | ICD-10-CM | POA: Diagnosis not present

## 2022-07-22 DIAGNOSIS — D631 Anemia in chronic kidney disease: Secondary | ICD-10-CM | POA: Diagnosis not present

## 2022-07-22 DIAGNOSIS — R300 Dysuria: Secondary | ICD-10-CM | POA: Diagnosis not present

## 2022-07-22 DIAGNOSIS — N390 Urinary tract infection, site not specified: Secondary | ICD-10-CM | POA: Diagnosis not present

## 2022-07-22 DIAGNOSIS — E1122 Type 2 diabetes mellitus with diabetic chronic kidney disease: Secondary | ICD-10-CM | POA: Diagnosis not present

## 2022-07-22 DIAGNOSIS — K219 Gastro-esophageal reflux disease without esophagitis: Secondary | ICD-10-CM | POA: Diagnosis not present

## 2022-07-22 DIAGNOSIS — I130 Hypertensive heart and chronic kidney disease with heart failure and stage 1 through stage 4 chronic kidney disease, or unspecified chronic kidney disease: Secondary | ICD-10-CM | POA: Diagnosis not present

## 2022-07-22 DIAGNOSIS — Z299 Encounter for prophylactic measures, unspecified: Secondary | ICD-10-CM | POA: Diagnosis not present

## 2022-07-22 DIAGNOSIS — Z7984 Long term (current) use of oral hypoglycemic drugs: Secondary | ICD-10-CM | POA: Diagnosis not present

## 2022-07-22 DIAGNOSIS — Z96642 Presence of left artificial hip joint: Secondary | ICD-10-CM | POA: Diagnosis not present

## 2022-07-22 DIAGNOSIS — N184 Chronic kidney disease, stage 4 (severe): Secondary | ICD-10-CM | POA: Diagnosis not present

## 2022-07-22 DIAGNOSIS — E782 Mixed hyperlipidemia: Secondary | ICD-10-CM | POA: Diagnosis not present

## 2022-07-22 DIAGNOSIS — Z7982 Long term (current) use of aspirin: Secondary | ICD-10-CM | POA: Diagnosis not present

## 2022-07-22 DIAGNOSIS — Z87440 Personal history of urinary (tract) infections: Secondary | ICD-10-CM | POA: Diagnosis not present

## 2022-07-22 DIAGNOSIS — I5021 Acute systolic (congestive) heart failure: Secondary | ICD-10-CM | POA: Diagnosis not present

## 2022-07-22 DIAGNOSIS — I5043 Acute on chronic combined systolic (congestive) and diastolic (congestive) heart failure: Secondary | ICD-10-CM | POA: Diagnosis not present

## 2022-07-22 DIAGNOSIS — E079 Disorder of thyroid, unspecified: Secondary | ICD-10-CM | POA: Diagnosis not present

## 2022-07-24 DIAGNOSIS — Z96642 Presence of left artificial hip joint: Secondary | ICD-10-CM | POA: Diagnosis not present

## 2022-07-24 DIAGNOSIS — Z7984 Long term (current) use of oral hypoglycemic drugs: Secondary | ICD-10-CM | POA: Diagnosis not present

## 2022-07-24 DIAGNOSIS — I5043 Acute on chronic combined systolic (congestive) and diastolic (congestive) heart failure: Secondary | ICD-10-CM | POA: Diagnosis not present

## 2022-07-24 DIAGNOSIS — E1122 Type 2 diabetes mellitus with diabetic chronic kidney disease: Secondary | ICD-10-CM | POA: Diagnosis not present

## 2022-07-24 DIAGNOSIS — N184 Chronic kidney disease, stage 4 (severe): Secondary | ICD-10-CM | POA: Diagnosis not present

## 2022-07-24 DIAGNOSIS — Z7982 Long term (current) use of aspirin: Secondary | ICD-10-CM | POA: Diagnosis not present

## 2022-07-24 DIAGNOSIS — E079 Disorder of thyroid, unspecified: Secondary | ICD-10-CM | POA: Diagnosis not present

## 2022-07-24 DIAGNOSIS — D631 Anemia in chronic kidney disease: Secondary | ICD-10-CM | POA: Diagnosis not present

## 2022-07-24 DIAGNOSIS — Z87440 Personal history of urinary (tract) infections: Secondary | ICD-10-CM | POA: Diagnosis not present

## 2022-07-24 DIAGNOSIS — I130 Hypertensive heart and chronic kidney disease with heart failure and stage 1 through stage 4 chronic kidney disease, or unspecified chronic kidney disease: Secondary | ICD-10-CM | POA: Diagnosis not present

## 2022-07-24 DIAGNOSIS — E782 Mixed hyperlipidemia: Secondary | ICD-10-CM | POA: Diagnosis not present

## 2022-07-24 DIAGNOSIS — K219 Gastro-esophageal reflux disease without esophagitis: Secondary | ICD-10-CM | POA: Diagnosis not present

## 2022-07-24 DIAGNOSIS — I2510 Atherosclerotic heart disease of native coronary artery without angina pectoris: Secondary | ICD-10-CM | POA: Diagnosis not present

## 2022-07-25 DIAGNOSIS — I2510 Atherosclerotic heart disease of native coronary artery without angina pectoris: Secondary | ICD-10-CM | POA: Diagnosis not present

## 2022-07-25 DIAGNOSIS — K219 Gastro-esophageal reflux disease without esophagitis: Secondary | ICD-10-CM | POA: Diagnosis not present

## 2022-07-25 DIAGNOSIS — Z7984 Long term (current) use of oral hypoglycemic drugs: Secondary | ICD-10-CM | POA: Diagnosis not present

## 2022-07-25 DIAGNOSIS — E1122 Type 2 diabetes mellitus with diabetic chronic kidney disease: Secondary | ICD-10-CM | POA: Diagnosis not present

## 2022-07-25 DIAGNOSIS — N184 Chronic kidney disease, stage 4 (severe): Secondary | ICD-10-CM | POA: Diagnosis not present

## 2022-07-25 DIAGNOSIS — I5043 Acute on chronic combined systolic (congestive) and diastolic (congestive) heart failure: Secondary | ICD-10-CM | POA: Diagnosis not present

## 2022-07-25 DIAGNOSIS — Z96642 Presence of left artificial hip joint: Secondary | ICD-10-CM | POA: Diagnosis not present

## 2022-07-25 DIAGNOSIS — D631 Anemia in chronic kidney disease: Secondary | ICD-10-CM | POA: Diagnosis not present

## 2022-07-25 DIAGNOSIS — I130 Hypertensive heart and chronic kidney disease with heart failure and stage 1 through stage 4 chronic kidney disease, or unspecified chronic kidney disease: Secondary | ICD-10-CM | POA: Diagnosis not present

## 2022-07-25 DIAGNOSIS — E079 Disorder of thyroid, unspecified: Secondary | ICD-10-CM | POA: Diagnosis not present

## 2022-07-25 DIAGNOSIS — Z87440 Personal history of urinary (tract) infections: Secondary | ICD-10-CM | POA: Diagnosis not present

## 2022-07-25 DIAGNOSIS — E782 Mixed hyperlipidemia: Secondary | ICD-10-CM | POA: Diagnosis not present

## 2022-07-25 DIAGNOSIS — Z7982 Long term (current) use of aspirin: Secondary | ICD-10-CM | POA: Diagnosis not present

## 2022-07-28 DIAGNOSIS — Z96642 Presence of left artificial hip joint: Secondary | ICD-10-CM | POA: Diagnosis not present

## 2022-07-28 DIAGNOSIS — Z7984 Long term (current) use of oral hypoglycemic drugs: Secondary | ICD-10-CM | POA: Diagnosis not present

## 2022-07-28 DIAGNOSIS — E079 Disorder of thyroid, unspecified: Secondary | ICD-10-CM | POA: Diagnosis not present

## 2022-07-28 DIAGNOSIS — N184 Chronic kidney disease, stage 4 (severe): Secondary | ICD-10-CM | POA: Diagnosis not present

## 2022-07-28 DIAGNOSIS — K219 Gastro-esophageal reflux disease without esophagitis: Secondary | ICD-10-CM | POA: Diagnosis not present

## 2022-07-28 DIAGNOSIS — I5043 Acute on chronic combined systolic (congestive) and diastolic (congestive) heart failure: Secondary | ICD-10-CM | POA: Diagnosis not present

## 2022-07-28 DIAGNOSIS — I2510 Atherosclerotic heart disease of native coronary artery without angina pectoris: Secondary | ICD-10-CM | POA: Diagnosis not present

## 2022-07-28 DIAGNOSIS — D631 Anemia in chronic kidney disease: Secondary | ICD-10-CM | POA: Diagnosis not present

## 2022-07-28 DIAGNOSIS — E1122 Type 2 diabetes mellitus with diabetic chronic kidney disease: Secondary | ICD-10-CM | POA: Diagnosis not present

## 2022-07-28 DIAGNOSIS — I130 Hypertensive heart and chronic kidney disease with heart failure and stage 1 through stage 4 chronic kidney disease, or unspecified chronic kidney disease: Secondary | ICD-10-CM | POA: Diagnosis not present

## 2022-07-28 DIAGNOSIS — Z87440 Personal history of urinary (tract) infections: Secondary | ICD-10-CM | POA: Diagnosis not present

## 2022-07-28 DIAGNOSIS — E782 Mixed hyperlipidemia: Secondary | ICD-10-CM | POA: Diagnosis not present

## 2022-07-28 DIAGNOSIS — Z7982 Long term (current) use of aspirin: Secondary | ICD-10-CM | POA: Diagnosis not present

## 2022-07-29 DIAGNOSIS — E782 Mixed hyperlipidemia: Secondary | ICD-10-CM | POA: Diagnosis not present

## 2022-07-29 DIAGNOSIS — Z96642 Presence of left artificial hip joint: Secondary | ICD-10-CM | POA: Diagnosis not present

## 2022-07-29 DIAGNOSIS — E079 Disorder of thyroid, unspecified: Secondary | ICD-10-CM | POA: Diagnosis not present

## 2022-07-29 DIAGNOSIS — N184 Chronic kidney disease, stage 4 (severe): Secondary | ICD-10-CM | POA: Diagnosis not present

## 2022-07-29 DIAGNOSIS — E1122 Type 2 diabetes mellitus with diabetic chronic kidney disease: Secondary | ICD-10-CM | POA: Diagnosis not present

## 2022-07-29 DIAGNOSIS — K219 Gastro-esophageal reflux disease without esophagitis: Secondary | ICD-10-CM | POA: Diagnosis not present

## 2022-07-29 DIAGNOSIS — Z7984 Long term (current) use of oral hypoglycemic drugs: Secondary | ICD-10-CM | POA: Diagnosis not present

## 2022-07-29 DIAGNOSIS — D631 Anemia in chronic kidney disease: Secondary | ICD-10-CM | POA: Diagnosis not present

## 2022-07-29 DIAGNOSIS — Z7982 Long term (current) use of aspirin: Secondary | ICD-10-CM | POA: Diagnosis not present

## 2022-07-29 DIAGNOSIS — I130 Hypertensive heart and chronic kidney disease with heart failure and stage 1 through stage 4 chronic kidney disease, or unspecified chronic kidney disease: Secondary | ICD-10-CM | POA: Diagnosis not present

## 2022-07-29 DIAGNOSIS — I2510 Atherosclerotic heart disease of native coronary artery without angina pectoris: Secondary | ICD-10-CM | POA: Diagnosis not present

## 2022-07-29 DIAGNOSIS — I5043 Acute on chronic combined systolic (congestive) and diastolic (congestive) heart failure: Secondary | ICD-10-CM | POA: Diagnosis not present

## 2022-07-29 DIAGNOSIS — Z87440 Personal history of urinary (tract) infections: Secondary | ICD-10-CM | POA: Diagnosis not present

## 2022-07-30 DIAGNOSIS — N184 Chronic kidney disease, stage 4 (severe): Secondary | ICD-10-CM | POA: Diagnosis not present

## 2022-07-30 DIAGNOSIS — I2510 Atherosclerotic heart disease of native coronary artery without angina pectoris: Secondary | ICD-10-CM | POA: Diagnosis not present

## 2022-07-30 DIAGNOSIS — E1122 Type 2 diabetes mellitus with diabetic chronic kidney disease: Secondary | ICD-10-CM | POA: Diagnosis not present

## 2022-07-30 DIAGNOSIS — K219 Gastro-esophageal reflux disease without esophagitis: Secondary | ICD-10-CM | POA: Diagnosis not present

## 2022-07-30 DIAGNOSIS — I5043 Acute on chronic combined systolic (congestive) and diastolic (congestive) heart failure: Secondary | ICD-10-CM | POA: Diagnosis not present

## 2022-07-30 DIAGNOSIS — Z7982 Long term (current) use of aspirin: Secondary | ICD-10-CM | POA: Diagnosis not present

## 2022-07-30 DIAGNOSIS — E782 Mixed hyperlipidemia: Secondary | ICD-10-CM | POA: Diagnosis not present

## 2022-07-30 DIAGNOSIS — Z7984 Long term (current) use of oral hypoglycemic drugs: Secondary | ICD-10-CM | POA: Diagnosis not present

## 2022-07-30 DIAGNOSIS — Z96642 Presence of left artificial hip joint: Secondary | ICD-10-CM | POA: Diagnosis not present

## 2022-07-30 DIAGNOSIS — I130 Hypertensive heart and chronic kidney disease with heart failure and stage 1 through stage 4 chronic kidney disease, or unspecified chronic kidney disease: Secondary | ICD-10-CM | POA: Diagnosis not present

## 2022-07-30 DIAGNOSIS — D631 Anemia in chronic kidney disease: Secondary | ICD-10-CM | POA: Diagnosis not present

## 2022-07-30 DIAGNOSIS — Z87440 Personal history of urinary (tract) infections: Secondary | ICD-10-CM | POA: Diagnosis not present

## 2022-07-30 DIAGNOSIS — E079 Disorder of thyroid, unspecified: Secondary | ICD-10-CM | POA: Diagnosis not present

## 2022-08-05 DIAGNOSIS — E079 Disorder of thyroid, unspecified: Secondary | ICD-10-CM | POA: Diagnosis not present

## 2022-08-05 DIAGNOSIS — I5043 Acute on chronic combined systolic (congestive) and diastolic (congestive) heart failure: Secondary | ICD-10-CM | POA: Diagnosis not present

## 2022-08-05 DIAGNOSIS — K219 Gastro-esophageal reflux disease without esophagitis: Secondary | ICD-10-CM | POA: Diagnosis not present

## 2022-08-05 DIAGNOSIS — E1122 Type 2 diabetes mellitus with diabetic chronic kidney disease: Secondary | ICD-10-CM | POA: Diagnosis not present

## 2022-08-05 DIAGNOSIS — I130 Hypertensive heart and chronic kidney disease with heart failure and stage 1 through stage 4 chronic kidney disease, or unspecified chronic kidney disease: Secondary | ICD-10-CM | POA: Diagnosis not present

## 2022-08-05 DIAGNOSIS — I2510 Atherosclerotic heart disease of native coronary artery without angina pectoris: Secondary | ICD-10-CM | POA: Diagnosis not present

## 2022-08-05 DIAGNOSIS — Z96642 Presence of left artificial hip joint: Secondary | ICD-10-CM | POA: Diagnosis not present

## 2022-08-05 DIAGNOSIS — Z7982 Long term (current) use of aspirin: Secondary | ICD-10-CM | POA: Diagnosis not present

## 2022-08-05 DIAGNOSIS — E782 Mixed hyperlipidemia: Secondary | ICD-10-CM | POA: Diagnosis not present

## 2022-08-05 DIAGNOSIS — D631 Anemia in chronic kidney disease: Secondary | ICD-10-CM | POA: Diagnosis not present

## 2022-08-05 DIAGNOSIS — Z87440 Personal history of urinary (tract) infections: Secondary | ICD-10-CM | POA: Diagnosis not present

## 2022-08-05 DIAGNOSIS — Z7984 Long term (current) use of oral hypoglycemic drugs: Secondary | ICD-10-CM | POA: Diagnosis not present

## 2022-08-05 DIAGNOSIS — N184 Chronic kidney disease, stage 4 (severe): Secondary | ICD-10-CM | POA: Diagnosis not present

## 2022-08-06 DIAGNOSIS — Z87440 Personal history of urinary (tract) infections: Secondary | ICD-10-CM | POA: Diagnosis not present

## 2022-08-06 DIAGNOSIS — E1122 Type 2 diabetes mellitus with diabetic chronic kidney disease: Secondary | ICD-10-CM | POA: Diagnosis not present

## 2022-08-06 DIAGNOSIS — Z7982 Long term (current) use of aspirin: Secondary | ICD-10-CM | POA: Diagnosis not present

## 2022-08-06 DIAGNOSIS — D631 Anemia in chronic kidney disease: Secondary | ICD-10-CM | POA: Diagnosis not present

## 2022-08-06 DIAGNOSIS — E079 Disorder of thyroid, unspecified: Secondary | ICD-10-CM | POA: Diagnosis not present

## 2022-08-06 DIAGNOSIS — Z96642 Presence of left artificial hip joint: Secondary | ICD-10-CM | POA: Diagnosis not present

## 2022-08-06 DIAGNOSIS — Z7984 Long term (current) use of oral hypoglycemic drugs: Secondary | ICD-10-CM | POA: Diagnosis not present

## 2022-08-06 DIAGNOSIS — K219 Gastro-esophageal reflux disease without esophagitis: Secondary | ICD-10-CM | POA: Diagnosis not present

## 2022-08-06 DIAGNOSIS — N184 Chronic kidney disease, stage 4 (severe): Secondary | ICD-10-CM | POA: Diagnosis not present

## 2022-08-06 DIAGNOSIS — I5043 Acute on chronic combined systolic (congestive) and diastolic (congestive) heart failure: Secondary | ICD-10-CM | POA: Diagnosis not present

## 2022-08-06 DIAGNOSIS — I130 Hypertensive heart and chronic kidney disease with heart failure and stage 1 through stage 4 chronic kidney disease, or unspecified chronic kidney disease: Secondary | ICD-10-CM | POA: Diagnosis not present

## 2022-08-06 DIAGNOSIS — E782 Mixed hyperlipidemia: Secondary | ICD-10-CM | POA: Diagnosis not present

## 2022-08-06 DIAGNOSIS — I2510 Atherosclerotic heart disease of native coronary artery without angina pectoris: Secondary | ICD-10-CM | POA: Diagnosis not present

## 2022-08-07 DIAGNOSIS — D631 Anemia in chronic kidney disease: Secondary | ICD-10-CM | POA: Diagnosis not present

## 2022-08-07 DIAGNOSIS — E1122 Type 2 diabetes mellitus with diabetic chronic kidney disease: Secondary | ICD-10-CM | POA: Diagnosis not present

## 2022-08-07 DIAGNOSIS — I5043 Acute on chronic combined systolic (congestive) and diastolic (congestive) heart failure: Secondary | ICD-10-CM | POA: Diagnosis not present

## 2022-08-07 DIAGNOSIS — I2510 Atherosclerotic heart disease of native coronary artery without angina pectoris: Secondary | ICD-10-CM | POA: Diagnosis not present

## 2022-08-07 DIAGNOSIS — Z96642 Presence of left artificial hip joint: Secondary | ICD-10-CM | POA: Diagnosis not present

## 2022-08-07 DIAGNOSIS — N184 Chronic kidney disease, stage 4 (severe): Secondary | ICD-10-CM | POA: Diagnosis not present

## 2022-08-07 DIAGNOSIS — Z87440 Personal history of urinary (tract) infections: Secondary | ICD-10-CM | POA: Diagnosis not present

## 2022-08-07 DIAGNOSIS — K219 Gastro-esophageal reflux disease without esophagitis: Secondary | ICD-10-CM | POA: Diagnosis not present

## 2022-08-07 DIAGNOSIS — Z7982 Long term (current) use of aspirin: Secondary | ICD-10-CM | POA: Diagnosis not present

## 2022-08-07 DIAGNOSIS — I130 Hypertensive heart and chronic kidney disease with heart failure and stage 1 through stage 4 chronic kidney disease, or unspecified chronic kidney disease: Secondary | ICD-10-CM | POA: Diagnosis not present

## 2022-08-07 DIAGNOSIS — E782 Mixed hyperlipidemia: Secondary | ICD-10-CM | POA: Diagnosis not present

## 2022-08-07 DIAGNOSIS — Z7984 Long term (current) use of oral hypoglycemic drugs: Secondary | ICD-10-CM | POA: Diagnosis not present

## 2022-08-07 DIAGNOSIS — E079 Disorder of thyroid, unspecified: Secondary | ICD-10-CM | POA: Diagnosis not present

## 2022-08-11 ENCOUNTER — Emergency Department (HOSPITAL_COMMUNITY): Payer: 59

## 2022-08-11 ENCOUNTER — Observation Stay (HOSPITAL_COMMUNITY)
Admission: EM | Admit: 2022-08-11 | Discharge: 2022-08-13 | Disposition: A | Discharge status: Skilled Nursing Facility | Payer: 59 | Attending: Family Medicine | Admitting: Family Medicine

## 2022-08-11 ENCOUNTER — Other Ambulatory Visit: Payer: Self-pay

## 2022-08-11 ENCOUNTER — Encounter (HOSPITAL_COMMUNITY): Payer: Self-pay

## 2022-08-11 DIAGNOSIS — E039 Hypothyroidism, unspecified: Secondary | ICD-10-CM | POA: Diagnosis not present

## 2022-08-11 DIAGNOSIS — I2510 Atherosclerotic heart disease of native coronary artery without angina pectoris: Secondary | ICD-10-CM | POA: Insufficient documentation

## 2022-08-11 DIAGNOSIS — E1165 Type 2 diabetes mellitus with hyperglycemia: Secondary | ICD-10-CM | POA: Insufficient documentation

## 2022-08-11 DIAGNOSIS — Z794 Long term (current) use of insulin: Secondary | ICD-10-CM

## 2022-08-11 DIAGNOSIS — N3000 Acute cystitis without hematuria: Secondary | ICD-10-CM | POA: Diagnosis not present

## 2022-08-11 DIAGNOSIS — Z7984 Long term (current) use of oral hypoglycemic drugs: Secondary | ICD-10-CM | POA: Diagnosis not present

## 2022-08-11 DIAGNOSIS — D649 Anemia, unspecified: Secondary | ICD-10-CM

## 2022-08-11 DIAGNOSIS — R600 Localized edema: Secondary | ICD-10-CM | POA: Diagnosis not present

## 2022-08-11 DIAGNOSIS — E8809 Other disorders of plasma-protein metabolism, not elsewhere classified: Secondary | ICD-10-CM | POA: Insufficient documentation

## 2022-08-11 DIAGNOSIS — K219 Gastro-esophageal reflux disease without esophagitis: Secondary | ICD-10-CM | POA: Diagnosis present

## 2022-08-11 DIAGNOSIS — I110 Hypertensive heart disease with heart failure: Secondary | ICD-10-CM | POA: Diagnosis not present

## 2022-08-11 DIAGNOSIS — Z743 Need for continuous supervision: Secondary | ICD-10-CM | POA: Diagnosis not present

## 2022-08-11 DIAGNOSIS — Z87891 Personal history of nicotine dependence: Secondary | ICD-10-CM | POA: Diagnosis not present

## 2022-08-11 DIAGNOSIS — Z96642 Presence of left artificial hip joint: Secondary | ICD-10-CM | POA: Insufficient documentation

## 2022-08-11 DIAGNOSIS — Z7982 Long term (current) use of aspirin: Secondary | ICD-10-CM | POA: Diagnosis not present

## 2022-08-11 DIAGNOSIS — F03918 Unspecified dementia, unspecified severity, with other behavioral disturbance: Secondary | ICD-10-CM | POA: Insufficient documentation

## 2022-08-11 DIAGNOSIS — I5042 Chronic combined systolic (congestive) and diastolic (congestive) heart failure: Secondary | ICD-10-CM | POA: Diagnosis present

## 2022-08-11 DIAGNOSIS — E785 Hyperlipidemia, unspecified: Secondary | ICD-10-CM | POA: Diagnosis not present

## 2022-08-11 DIAGNOSIS — I10 Essential (primary) hypertension: Secondary | ICD-10-CM | POA: Diagnosis present

## 2022-08-11 DIAGNOSIS — E46 Unspecified protein-calorie malnutrition: Secondary | ICD-10-CM | POA: Insufficient documentation

## 2022-08-11 DIAGNOSIS — R55 Syncope and collapse: Principal | ICD-10-CM | POA: Diagnosis present

## 2022-08-11 DIAGNOSIS — R770 Abnormality of albumin: Secondary | ICD-10-CM | POA: Diagnosis not present

## 2022-08-11 DIAGNOSIS — Z79899 Other long term (current) drug therapy: Secondary | ICD-10-CM | POA: Diagnosis not present

## 2022-08-11 DIAGNOSIS — R6889 Other general symptoms and signs: Secondary | ICD-10-CM | POA: Diagnosis not present

## 2022-08-11 DIAGNOSIS — R404 Transient alteration of awareness: Secondary | ICD-10-CM | POA: Diagnosis not present

## 2022-08-11 LAB — HEPATIC FUNCTION PANEL
ALT: 16 U/L (ref 0–44)
AST: 22 U/L (ref 15–41)
Albumin: 2.7 g/dL — ABNORMAL LOW (ref 3.5–5.0)
Alkaline Phosphatase: 98 U/L (ref 38–126)
Bilirubin, Direct: 0.1 mg/dL (ref 0.0–0.2)
Indirect Bilirubin: 0.5 mg/dL (ref 0.3–0.9)
Total Bilirubin: 0.6 mg/dL (ref 0.3–1.2)
Total Protein: 6.6 g/dL (ref 6.5–8.1)

## 2022-08-11 LAB — URINALYSIS, ROUTINE W REFLEX MICROSCOPIC
Bilirubin Urine: NEGATIVE
Glucose, UA: NEGATIVE mg/dL
Hgb urine dipstick: NEGATIVE
Ketones, ur: NEGATIVE mg/dL
Nitrite: NEGATIVE
Protein, ur: NEGATIVE mg/dL
Specific Gravity, Urine: 1.008 (ref 1.005–1.030)
pH: 5 (ref 5.0–8.0)

## 2022-08-11 LAB — BASIC METABOLIC PANEL
Anion gap: 10 (ref 5–15)
BUN: 61 mg/dL — ABNORMAL HIGH (ref 8–23)
CO2: 22 mmol/L (ref 22–32)
Calcium: 9.4 mg/dL (ref 8.9–10.3)
Chloride: 104 mmol/L (ref 98–111)
Creatinine, Ser: 2.91 mg/dL — ABNORMAL HIGH (ref 0.44–1.00)
GFR, Estimated: 15 mL/min — ABNORMAL LOW (ref >60)
Glucose, Bld: 175 mg/dL — ABNORMAL HIGH (ref 70–99)
Potassium: 4.9 mmol/L (ref 3.5–5.1)
Sodium: 136 mmol/L (ref 135–145)

## 2022-08-11 LAB — CBC
HCT: 29 % — ABNORMAL LOW (ref 36.0–46.0)
Hemoglobin: 8.7 g/dL — ABNORMAL LOW (ref 12.0–15.0)
MCH: 26 pg (ref 26.0–34.0)
MCHC: 30 g/dL (ref 30.0–36.0)
MCV: 86.8 fL (ref 80.0–100.0)
Platelets: 204 10*3/uL (ref 150–400)
RBC: 3.34 MIL/uL — ABNORMAL LOW (ref 3.87–5.11)
RDW: 17.4 % — ABNORMAL HIGH (ref 11.5–15.5)
WBC: 8 10*3/uL (ref 4.0–10.5)
nRBC: 0 % (ref 0.0–0.2)

## 2022-08-11 LAB — TROPONIN I (HIGH SENSITIVITY)
Troponin I (High Sensitivity): 48 ng/L — ABNORMAL HIGH (ref <18)
Troponin I (High Sensitivity): 47 ng/L — ABNORMAL HIGH (ref <18)
Troponin I (High Sensitivity): 47 ng/L — ABNORMAL HIGH (ref <18)

## 2022-08-11 LAB — BRAIN NATRIURETIC PEPTIDE: B Natriuretic Peptide: 246 pg/mL — ABNORMAL HIGH (ref 0.0–100.0)

## 2022-08-11 LAB — TSH: TSH: 25.363 u[IU]/mL — ABNORMAL HIGH (ref 0.350–4.500)

## 2022-08-11 LAB — MAGNESIUM: Magnesium: 2.4 mg/dL (ref 1.7–2.4)

## 2022-08-11 MED ORDER — SODIUM CHLORIDE 0.9 % IV SOLN
1.0000 g | Freq: Once | INTRAVENOUS | Status: AC
  Administered 2022-08-11: 1 g via INTRAVENOUS
  Filled 2022-08-11: qty 10

## 2022-08-11 MED ORDER — SODIUM CHLORIDE 0.9 % IV BOLUS
500.0000 mL | Freq: Once | INTRAVENOUS | Status: AC
  Administered 2022-08-11: 500 mL via INTRAVENOUS

## 2022-08-11 NOTE — ED Triage Notes (Signed)
Pt BIB [facility] from home C/O syncopal event witnessed by home health nurse and daughter. Per EMS, A&O X 4 on their arrival. Denies pain.

## 2022-08-11 NOTE — ED Provider Notes (Signed)
5:23 PM Patient signed out to me by previous ED physician. 89  yo female presenting for weakness with syncope.  I spoke with patient's granddaughter, [name], who states patient became unresponsive while sitting today around 12:30pm. Patient recovered. Then while waiting for EMS to arrive to the house patient slumped over in the chair and became unresponsive again. States patient awoke when EMS arrived and was agitated and aggressive/confused. Patient was recently diagnosed with a UTI in the last two weeks on Keflex. No fevers. States dysuria and increased frequency has resolved.      Physical Exam  BP (!) 146/47 (BP Location: Right Arm)   Pulse 68   Temp 97.9 F (36.6 C) (Axillary)   Resp 12   SpO2 96%   Physical Exam  Procedures  Procedures  ED Course / MDM    Medical Decision Making Amount and/or Complexity of Data Reviewed Labs: ordered. Radiology: ordered. ECG/medicine tests: ordered.  Risk Decision regarding hospitalization.   CTH stable. CXR stable.  Hemoglobin drop from 9.4 to 8.7 in the past month. Family denies any active bleeding. Possibly due to anemia of chronic disease from cKD.   UA demonstrates mild UTI. May be resolving due to previous dx 2 weeks ago and outpatient antibiotics. Will treat and send cultures. I do not think this caused her syncope today.  EKG stable. Initial troponin elevated but delta troponins are stable. I recommend admission for concerns for cardiogenic syncope. Patient accepted by admitting physician.      Edwin Dada P, DO 08/11/22 2351

## 2022-08-11 NOTE — ED Notes (Signed)
Portable Xray at bedside.

## 2022-08-11 NOTE — ED Provider Notes (Signed)
[place] EMERGENCY DEPARTMENT AT [facility] Provider Note   CSN: [id number] Arrival date & time: 08/11/22  1411     History  Chief Complaint  Patient presents with   Loss of Consciousness    KYRAN BIELEN is a 89 y.o. female.   Loss of Consciousness Patient presents after syncopal episode.  This occurred at home and was witnessed by family and home health nurse.  Estimated duration was reportedly very brief.  Patient was alert and oriented on EMS arrival.  Initial glucose was in the 220s.  Patient has reportedly been taken off of her diabetic medications over the past several weeks.  At home medications include aspirin, Lasix, Imdur, Synthroid, metoprolol.  Patient currently denies any areas of discomfort.     Home Medications Prior to Admission medications   Medication Sig Start Date End Date Taking? Authorizing Provider  allopurinol (ZYLOPRIM) 100 MG tablet Take 100 mg by mouth every morning.     [provider]  Apoaequorin (PREVAGEN PO) Take 1 tablet by mouth every evening. Patient not taking: Reported on 07/17/2022    [provider]  aspirin EC 81 MG tablet Take 81 mg by mouth every evening.     [provider]  diclofenac Sodium (VOLTAREN) 1 % GEL Apply 2 g topically daily as needed (for pain). 03/25/19   [provider]  diphenhydrAMINE (BENADRYL) 2 % cream Apply 1 application  topically 3 (three) times daily as needed for itching (rash).    [provider]  famotidine (PEPCID) 20 MG tablet Take 20 mg by mouth at bedtime.     [provider]  folic acid (FOLVITE) 400 MCG tablet Take 400 mcg by mouth every morning.     [provider]  furosemide (LASIX) 40 MG tablet Take 1 tablet (40 mg total) by mouth daily. 07/08/22   Osvaldo Shipper, MD  glimepiride (AMARYL) 2 MG tablet Take 2 mg by mouth daily before breakfast.    [provider]  Global Inject Ease Lancets 30G MISC See admin  instructions. 06/23/19   [provider]  Glycerin-Hypromellose-PEG 400 (DRY EYE RELIEF DROPS) 0.2-0.2-1 % SOLN Apply 1 drop to eye daily as needed (dry eyes).    [provider]  hydrALAZINE (APRESOLINE) 25 MG tablet Take 1 tablet (25 mg total) by mouth every 8 (eight) hours. 07/08/22   Osvaldo Shipper, MD  isosorbide mononitrate (IMDUR) 30 MG 24 hr tablet Take 0.5 tablets (15 mg total) by mouth daily. 07/09/22   Osvaldo Shipper, MD  levothyroxine (SYNTHROID) 75 MCG tablet Take 0.5 tablets (37.5 mcg total) by mouth daily at 6 (six) AM. 07/09/22   Osvaldo Shipper, MD  Melatonin 10 MG TABS Take 1 tablet by mouth every evening.    [provider]  metoprolol succinate (TOPROL-XL) 25 MG 24 hr tablet Take 0.5 tablets (12.5 mg total) by mouth daily. 07/09/22   Osvaldo Shipper, MD  [facility] VERIO test strip 1 each daily. 06/23/19   [provider]  simvastatin (ZOCOR) 20 MG tablet Take 20 mg by mouth at bedtime.    [provider]  traZODone (DESYREL) 50 MG tablet Take 100 mg by mouth at bedtime. 12/11/21   [provider]      Allergies    Codeine    Review of Systems   Review of Systems  Cardiovascular:  Positive for syncope.  Neurological:  Positive for syncope.  All other systems reviewed and are negative.   Physical Exam Updated  Vital Signs BP (!) 146/47 (BP Location: Right Arm)   Pulse 68   Temp 97.9 F (36.6 C) (Axillary)   Resp 12   SpO2 96%  Physical Exam Vitals and nursing note reviewed.  Constitutional:      General: She is not in acute distress.    Appearance: Normal appearance. She is well-developed. She is not toxic-appearing or diaphoretic.  HENT:     Head: Normocephalic and atraumatic.     Right Ear: External ear normal.     Left Ear: External ear normal.     Nose: Nose normal.     Mouth/Throat:     Mouth: Mucous membranes are moist.  Eyes:     Extraocular Movements: Extraocular movements intact.      Conjunctiva/sclera: Conjunctivae normal.  Cardiovascular:     Rate and Rhythm: Normal rate and regular rhythm.     Heart sounds: No murmur heard. Pulmonary:     Effort: Pulmonary effort is normal. No respiratory distress.     Breath sounds: Normal breath sounds. No wheezing or rales.  Abdominal:     General: There is no distension.     Palpations: Abdomen is soft.     Tenderness: There is no abdominal tenderness.  Musculoskeletal:        General: No swelling. Normal range of motion.     Cervical back: Normal range of motion and neck supple.     Right lower leg: No edema.     Left lower leg: Edema present.  Skin:    General: Skin is warm and dry.     Capillary Refill: Capillary refill takes less than 2 seconds.     Coloration: Skin is not jaundiced or pale.  Neurological:     General: No focal deficit present.     Mental Status: She is alert and oriented to person, place, and time.     Cranial Nerves: No cranial nerve deficit.     Sensory: No sensory deficit.     Motor: No weakness.     Coordination: Coordination normal.  Psychiatric:        Mood and Affect: Mood normal.        Behavior: Behavior normal.        Thought Content: Thought content normal.        Judgment: Judgment normal.     ED Results / Procedures / Treatments   Labs (all labs ordered are listed, but only abnormal results are displayed) Labs Reviewed  BASIC METABOLIC PANEL  CBC  HEPATIC FUNCTION PANEL  BRAIN NATRIURETIC PEPTIDE  URINALYSIS, ROUTINE W REFLEX MICROSCOPIC  MAGNESIUM  CBG MONITORING, ED  TROPONIN I (HIGH SENSITIVITY)    EKG None  Radiology No results found.  Procedures Procedures    Medications Ordered in ED Medications - No data to display  ED Course/ Medical Decision Making/ A&P                             Medical Decision Making Amount and/or Complexity of Data Reviewed Labs: ordered. Radiology: ordered. ECG/medicine tests: ordered.   This patient presents to the  ED for concern of syncope, this involves an extensive number of treatment options, and is a complaint that carries with it a high risk of complications and morbidity.  The differential diagnosis includes arrhythmia, vasovagal episode, narcolepsy, seizure, TIA, hypertension, polypharmacy   Co morbidities that complicate the patient evaluation  HTN, CAD, DM, GERD, CHF   Additional  history obtained:  Additional history obtained from N/A External records from outside source obtained and reviewed including EMR   Lab Tests:  I Ordered, and personally interpreted labs.  The pertinent results include: Baseline CKD, baseline anemia, no leukocytosis, slight elevations in BNP and troponin   Imaging Studies ordered:  I ordered imaging studies including chest x-ray, CT head, left DVT study I independently visualized and interpreted imaging which showed no acute findings I agree with the radiologist interpretation   Cardiac Monitoring: / EKG:  The patient was maintained on a cardiac monitor.  I personally viewed and interpreted the cardiac monitored which showed an underlying rhythm of: Sinus rhythm  Problem List / ED Course / Critical interventions / Medication management  Patient presents after a syncopal episode.  This was reportedly witnessed at home by home health aide and daughter.  On arrival in the ED, patient is alert and oriented.  She has no complaints at this time.  Vital signs are normal.  She has no focal neurologic deficits on exam.  Patient was placed on bedside cardiac monitor.  Workup was initiated.  Initial workup was unremarkable.  Care of patient was signed out to oncoming ED provider.   Social Determinants of Health:  Lives at home with daughter        Final Clinical Impression(s) / ED Diagnoses Final diagnoses:  None    Rx / DC Orders ED Discharge Orders     None         Gloris Manchester, MD 08/11/22 1900

## 2022-08-11 NOTE — H&P (Signed)
History and Physical    Patient: Marie Hunt ZOX:[id number] DOB: 1932/06/17 DOA: 08/11/2022 DOS: the patient was seen and examined on 08/12/2022 PCP: Kirstie Peri, MD  Patient coming from: Home  Chief Complaint:  Chief Complaint  Patient presents with   Loss of Consciousness   HPI: Marie Hunt is a 89 y.o. female with medical history significant of hypertension, GERD, T2DM, hypothyroidism, hyperlipidemia, CAD who presents to the emergency department via EMS from home due to syncope.  Patient was unable to provide history, history was obtained from ED physician and ED medical record.  Per report, patient was reported to suddenly became unresponsive today around 2:30 PM while sitting, but she quickly recovered without any confusion after recovery.  EMS was activated, but prior to arrival of EMS team, patient slumped over and became unresponsive again.  She woke up when EMS team arrived, she was agitated and confused.  Patient was currently taking Keflex due to recent UTI diagnosis.  No fever, chest pain, shortness of breath was reported.  ED Course:  In the emergency department, BP was 146/47, other vital signs were within normal range.  Workup in the ED showed normocytic anemia, albumin 2.7, BMP was normal except for blood glucose of 175 and BUN/creatinine of 61/2.91 (baseline of 2.4-2.7) troponin 48 > 47 > 47, BNP 246, urinalysis was unimpressive for UTI. CT of head showed no acute intracranial abnormality Chest x-ray showed no active disease Left lower extremity showed no evidence of DVT She was treated with IV ceftriaxone, IV hydration was provided. Hospitalist was asked to admit patient for further evaluation and management.  Review of Systems: Review of systems as noted in the HPI. All other systems reviewed and are negative.   Past Medical History:  Diagnosis Date   Anemia    Carotid artery disease (HCC)    1-39% RICA, patent LICA 7/12 - Dr. Edilia Bo   Cholelithiasis     Cholelithiasis    In need of cholecystectomy   Diabetes mellitus    Type II   Essential hypertension, benign    GERD (gastroesophageal reflux disease)    GERD (gastroesophageal reflux disease)    Mixed hyperlipidemia    Sigmoid diverticulosis    Sigmoid diverticulosis    Type 2 diabetes mellitus (HCC)    Past Surgical History:  Procedure Laterality Date   CAROTID ENDARTERECTOMY  08/24/2009   Left catroid endarterectomy   CATARACT EXTRACTION W/PHACO Left 08/17/2014   Procedure: CATARACT EXTRACTION PHACO AND INTRAOCULAR LENS PLACEMENT; CDE:  7.39;  Surgeon: Gemma Payor, MD;  Location: [facility];  Service: Ophthalmology;  Laterality: Left;   CATARACT EXTRACTION W/PHACO Right 10/02/2014   Procedure: CATARACT EXTRACTION PHACO AND INTRAOCULAR LENS PLACEMENT RIGHT EYE CDE=5.83;  Surgeon: Gemma Payor, MD;  Location: [facility];  Service: Ophthalmology;  Laterality: Right;   CYSTOSCOPY W/ RETROGRADES Bilateral 03/28/2019   Procedure: CYSTOSCOPY WITH BILATRAL RETROGRADE PYELOGRAM,;  Surgeon: Malen Gauze, MD;  Location: [facility];  Service: Urology;  Laterality: Bilateral;   EYE SURGERY     HIP ARTHROPLASTY Left 10/03/2014   Procedure: ARTHROPLASTY BIPOLAR HIP (HEMIARTHROPLASTY);  Surgeon: Darreld Mclean, MD;  Location: [facility];  Service: Orthopedics;  Laterality: Left;   Left carotid endarterectomy  7/11   Dr. Edilia Bo   TRANSURETHRAL RESECTION OF BLADDER TUMOR N/A 03/28/2019   Procedure: TRANSURETHRAL RESECTION OF BLADDER TUMOR (TURBT);  Surgeon: Malen Gauze, MD;  Location: [facility];  Service: Urology;  Laterality: N/A;   URETEROSCOPY Left 03/28/2019   Procedure:  DIAGNOSTIC URETEROSCOPY WITH LEFT URETERAL STENT PLACEMENT;  Surgeon: Malen Gauze, MD;  Location: [facility];  Service: Urology;  Laterality: Left;    Social History:  reports that she quit smoking about 10 years ago. Her smoking use included cigarettes. She has a 18.00 pack-year smoking history. She has never used smokeless tobacco. She  reports that she does not drink alcohol and does not use drugs.   Allergies  Allergen Reactions   Codeine Nausea And Vomiting    Family History  Problem Relation Age of Onset   Diabetes Mother    Diabetes Father    Heart attack Daughter    Other Daughter        Bleeding problems   Cancer Daughter    Coronary artery disease Other    Heart disease Sister    Diabetes Sister    Hyperlipidemia Sister    Hypertension Sister    Heart disease Sister    Heart disease Sister      Prior to Admission medications   Medication Sig Start Date End Date Taking? Authorizing Provider  allopurinol (ZYLOPRIM) 100 MG tablet Take 100 mg by mouth every morning.     [provider]  Apoaequorin (PREVAGEN PO) Take 1 tablet by mouth every evening. Patient not taking: Reported on 07/17/2022    [provider]  aspirin EC 81 MG tablet Take 81 mg by mouth every evening.     [provider]  diclofenac Sodium (VOLTAREN) 1 % GEL Apply 2 g topically daily as needed (for pain). 03/25/19   [provider]  diphenhydrAMINE (BENADRYL) 2 % cream Apply 1 application  topically 3 (three) times daily as needed for itching (rash).    [provider]  famotidine (PEPCID) 20 MG tablet Take 20 mg by mouth at bedtime.     [provider]  folic acid (FOLVITE) 400 MCG tablet Take 400 mcg by mouth every morning.     [provider]  furosemide (LASIX) 40 MG tablet Take 1 tablet (40 mg total) by mouth daily. 07/08/22   Osvaldo Shipper, MD  glimepiride (AMARYL) 2 MG tablet Take 2 mg by mouth daily before breakfast.    [provider]  Global Inject Ease Lancets 30G MISC See admin instructions. 06/23/19   [provider]  Glycerin-Hypromellose-PEG 400 (DRY EYE RELIEF DROPS) 0.2-0.2-1 % SOLN Apply 1 drop to eye daily as needed (dry eyes).    [provider]  hydrALAZINE (APRESOLINE) 25 MG tablet Take 1 tablet (25 mg total) by mouth every 8  (eight) hours. 07/08/22   Osvaldo Shipper, MD  isosorbide mononitrate (IMDUR) 30 MG 24 hr tablet Take 0.5 tablets (15 mg total) by mouth daily. 07/09/22   Osvaldo Shipper, MD  levothyroxine (SYNTHROID) 75 MCG tablet Take 0.5 tablets (37.5 mcg total) by mouth daily at 6 (six) AM. 07/09/22   Osvaldo Shipper, MD  Melatonin 10 MG TABS Take 1 tablet by mouth every evening.    [provider]  metoprolol succinate (TOPROL-XL) 25 MG 24 hr tablet Take 0.5 tablets (12.5 mg total) by mouth daily. 07/09/22   Osvaldo Shipper, MD  [facility] VERIO test strip 1 each daily. 06/23/19   [provider]  simvastatin (ZOCOR) 20 MG tablet Take 20 mg by mouth at bedtime.    [provider]  traZODone (DESYREL) 50 MG tablet Take 100 mg by mouth at bedtime. 12/11/21   [provider]    Physical Exam: BP (!) 137/42 (BP Location: Right  Arm)   Pulse 63   Temp 98.4 F (36.9 C) (Oral)   Resp 16   SpO2 98%   General: 89 y.o. year-old female well developed well nourished in no acute distress.  Alert and oriented x3. HEENT: NCAT, EOMI Neck: Supple, trachea medial Cardiovascular: Regular rate and rhythm with no rubs or gallops.  No thyromegaly or JVD noted.  No lower extremity edema. 2/4 pulses in all 4 extremities. Respiratory: Clear to auscultation with no wheezes or rales. Good inspiratory effort. Abdomen: Soft, nontender nondistended with normal bowel sounds x4 quadrants. Muskuloskeletal: No cyanosis, clubbing or edema noted bilaterally Neuro: CN II-XII intact, strength 5/5 x 4, sensation, reflexes intact Skin: No ulcerative lesions noted or rashes Psychiatry: Judgement and insight appear normal. Mood is appropriate for condition and setting          Labs on Admission:  Basic Metabolic Panel: Recent Labs  Lab 08/11/22 1440  NA 136  K 4.9  CL 104  CO2 22  GLUCOSE 175*  BUN 61*  CREATININE 2.91*  CALCIUM 9.4  MG 2.4   Liver Function Tests: Recent Labs  Lab  08/11/22 1440  AST 22  ALT 16  ALKPHOS 98  BILITOT 0.6  PROT 6.6  ALBUMIN 2.7*   No results for input(s): "LIPASE", "AMYLASE" in the last 168 hours. No results for input(s): "AMMONIA" in the last 168 hours. CBC: Recent Labs  Lab 08/11/22 1440  WBC 8.0  HGB 8.7*  HCT 29.0*  MCV 86.8  PLT 204   Cardiac Enzymes: No results for input(s): "CKTOTAL", "CKMB", "CKMBINDEX", "TROPONINI" in the last 168 hours.  BNP (last 3 results) Recent Labs    07/02/22 1555 08/11/22 1440  BNP 1,307.0* 246.0*    ProBNP (last 3 results) No results for input(s): "PROBNP" in the last 8760 hours.  CBG: No results for input(s): "GLUCAP" in the last 168 hours.  Radiological Exams on Admission: CT HEAD WO CONTRAST  Result Date: 08/11/2022 CLINICAL DATA:  Syncope EXAM: CT HEAD WITHOUT CONTRAST TECHNIQUE: Contiguous axial images were obtained from the base of the skull through the vertex without intravenous contrast. RADIATION DOSE REDUCTION: This exam was performed according to the departmental dose-optimization program which includes automated exposure control, adjustment of the mA and/or kV according to patient size and/or use of iterative reconstruction technique. COMPARISON:  None Available. FINDINGS: Brain: No acute territorial infarction, hemorrhage or intracranial mass. Moderate atrophy. Moderate white matter hypodensity consistent with chronic small vessel ischemic change. Nonenlarged ventricles Vascular: No hyperdense vessels. Dense carotid vascular calcification. Skull: Normal. Negative for fracture or focal lesion. Sinuses/Orbits: No acute finding. Other: None IMPRESSION: 1. No CT evidence for acute intracranial abnormality. 2. Atrophy and chronic small vessel ischemic changes of the white matter. Electronically Signed   By: Jasmine Pang M.D.   On: 08/11/2022 18:10   US Venous Img Lower Unilateral Left  Result Date: 08/11/2022 CLINICAL DATA:  Left lower extremity edema. Former smoker. Evaluate  for DVT. EXAM: LEFT LOWER EXTREMITY VENOUS DOPPLER ULTRASOUND TECHNIQUE: Gray-scale sonography with graded compression, as well as color Doppler and duplex ultrasound were performed to evaluate the lower extremity deep venous systems from the level of the common femoral vein and including the common femoral, femoral, profunda femoral, popliteal and calf veins including the posterior tibial, peroneal and gastrocnemius veins when visible. The superficial great saphenous vein was also interrogated. Spectral Doppler was utilized to evaluate flow at rest and with distal augmentation maneuvers in the common femoral, femoral and popliteal veins.  COMPARISON:  None Available. FINDINGS: Contralateral Common Femoral Vein: Respiratory phasicity is normal and symmetric with the symptomatic side. No evidence of thrombus. Normal compressibility. Common Femoral Vein: No evidence of thrombus. Normal compressibility, respiratory phasicity and response to augmentation. Saphenofemoral Junction: No evidence of thrombus. Normal compressibility and flow on color Doppler imaging. Profunda Femoral Vein: No evidence of thrombus. Normal compressibility and flow on color Doppler imaging. Femoral Vein: No evidence of thrombus. Normal compressibility, respiratory phasicity and response to augmentation. Popliteal Vein: No evidence of thrombus. Normal compressibility, respiratory phasicity and response to augmentation. Calf Veins: No evidence of thrombus. Normal compressibility and flow on color Doppler imaging. Superficial Great Saphenous Vein: No evidence of thrombus. Normal compressibility. Other Findings:  None. IMPRESSION: No evidence of DVT within the left lower extremity. Electronically Signed   By: Simonne Come M.D.   On: 08/11/2022 16:22   DG Chest Portable 1 View  Result Date: 08/11/2022 CLINICAL DATA:  Syncope EXAM: PORTABLE CHEST 1 VIEW COMPARISON:  Chest x-ray 07/03/2022 FINDINGS: Cardiomediastinal silhouette is stable, the heart  is mildly enlarged. There is no focal lung infiltrate, pleural effusion or pneumothorax. There are scattered chronic appearing interstitial opacities bilaterally. No acute fractures are seen. IMPRESSION: 1. No active disease. 2. Chronic appearing interstitial opacities bilaterally. Electronically Signed   By: Darliss Cheney M.D.   On: 08/11/2022 15:35    EKG: I independently viewed the EKG done and my findings are as followed: EKG showed normal sinus rhythm at a rate of 75 bpm  Assessment/Plan Present on Admission:  Syncope and collapse  Essential hypertension  GERD without esophagitis  Chronic combined systolic and diastolic CHF (congestive heart failure) (HCC)  Hyperlipidemia with target low density lipoprotein (LDL) cholesterol less than 100 mg/dL  Principal Problem:   Syncope and collapse Active Problems:   Essential hypertension   Hyperlipidemia with target low density lipoprotein (LDL) cholesterol less than 100 mg/dL   GERD without esophagitis   Chronic combined systolic and diastolic CHF (congestive heart failure) (HCC)   Hypoalbuminemia due to protein-calorie malnutrition (HCC)   Acquired hypothyroidism   Type 2 diabetes mellitus with hyperglycemia (HCC)  Syncope and collapse Continue telemetry and watch for arrhythmias Troponins 48 > 47 ; continue to trend troponin EKG showed EKG showed normal sinus rhythm at a rate of 75 bpm Echocardiogram done on 07/03/2022 showed LVEF of 30 to 35%.  LV demonstrates RWMA.  G1 DD Echocardiogram will be done to rule out significant aortic stenosis or other outflow obstruction, and also to evaluate EF and to rule out segmental/Regional wall motion abnormalities.  Carotid artery Dopplers will be done to rule out hemodynamically significant stenosis  UTI POA Patient was recently treated for UTI IV ceftriaxone x 1 was given in the ED empirically Urinalysis was unimpressive for UTI Patient denies any irritative bladder symptoms Urine culture  pending  Hypoalbuminemia Albumin 2.7, protein supplement will be provided  Chronic combined systolic and diastolic CHF Continue aspirin, Lasix, Toprol-XL  Acquired hypothyroidism Continue Synthroid  Type 2 diabetes mellitus with hyperglycemia Hemoglobin A1c on 07/03/2022 was 7.7 Continue ISS and hypoglycemia protocol  Essential hypertension Continue Lasix, Toprol-XL, Imdur  Mixed hyperlipidemia Continue Zocor  GERD Continue Pepcid  DVT prophylaxis: Heparin subcu   Advance Care Planning: Full code  Consults: None  Family Communication: None at bedside  Severity of Illness: The appropriate patient status for this patient is OBSERVATION. Observation status is judged to be reasonable and necessary in order to provide the required intensity of service  to ensure the patient's safety. The patient's presenting symptoms, physical exam findings, and initial radiographic and laboratory data in the context of their medical condition is felt to place them at decreased risk for further clinical deterioration. Furthermore, it is anticipated that the patient will be medically stable for discharge from the hospital within 2 midnights of admission.   Author: Frankey Shown, DO 08/12/2022 4:51 AM  For on call review [email or web address].

## 2022-08-12 ENCOUNTER — Observation Stay (HOSPITAL_COMMUNITY): Payer: 59

## 2022-08-12 ENCOUNTER — Other Ambulatory Visit (HOSPITAL_COMMUNITY): Payer: Self-pay | Admitting: *Deleted

## 2022-08-12 ENCOUNTER — Observation Stay (HOSPITAL_BASED_OUTPATIENT_CLINIC_OR_DEPARTMENT_OTHER): Payer: 59

## 2022-08-12 DIAGNOSIS — E46 Unspecified protein-calorie malnutrition: Secondary | ICD-10-CM | POA: Insufficient documentation

## 2022-08-12 DIAGNOSIS — R55 Syncope and collapse: Secondary | ICD-10-CM | POA: Diagnosis not present

## 2022-08-12 DIAGNOSIS — E039 Hypothyroidism, unspecified: Secondary | ICD-10-CM | POA: Insufficient documentation

## 2022-08-12 DIAGNOSIS — E1165 Type 2 diabetes mellitus with hyperglycemia: Secondary | ICD-10-CM | POA: Insufficient documentation

## 2022-08-12 DIAGNOSIS — I6523 Occlusion and stenosis of bilateral carotid arteries: Secondary | ICD-10-CM | POA: Diagnosis not present

## 2022-08-12 DIAGNOSIS — I771 Stricture of artery: Secondary | ICD-10-CM | POA: Diagnosis not present

## 2022-08-12 LAB — COMPREHENSIVE METABOLIC PANEL
ALT: 13 U/L (ref 0–44)
AST: 17 U/L (ref 15–41)
Albumin: 2.4 g/dL — ABNORMAL LOW (ref 3.5–5.0)
Alkaline Phosphatase: 81 U/L (ref 38–126)
Anion gap: 10 (ref 5–15)
BUN: 54 mg/dL — ABNORMAL HIGH (ref 8–23)
CO2: 23 mmol/L (ref 22–32)
Calcium: 8.9 mg/dL (ref 8.9–10.3)
Chloride: 104 mmol/L (ref 98–111)
Creatinine, Ser: 2.57 mg/dL — ABNORMAL HIGH (ref 0.44–1.00)
GFR, Estimated: 18 mL/min — ABNORMAL LOW (ref >60)
Glucose, Bld: 114 mg/dL — ABNORMAL HIGH (ref 70–99)
Potassium: 4.4 mmol/L (ref 3.5–5.1)
Sodium: 137 mmol/L (ref 135–145)
Total Bilirubin: 0.4 mg/dL (ref 0.3–1.2)
Total Protein: 6.1 g/dL — ABNORMAL LOW (ref 6.5–8.1)

## 2022-08-12 LAB — ECHOCARDIOGRAM LIMITED
AR max vel: 1.22 cm2
AV Area VTI: 1.35 cm2
AV Area mean vel: 1.13 cm2
AV Mean grad: 15.4 mmHg
AV Peak grad: 24.4 mmHg
Ao pk vel: 2.47 m/s
P 1/2 time: 416 msec
S' Lateral: 3.8 cm

## 2022-08-12 LAB — CBC
HCT: 25.6 % — ABNORMAL LOW (ref 36.0–46.0)
Hemoglobin: 7.9 g/dL — ABNORMAL LOW (ref 12.0–15.0)
MCH: 26 pg (ref 26.0–34.0)
MCHC: 30.9 g/dL (ref 30.0–36.0)
MCV: 84.2 fL (ref 80.0–100.0)
Platelets: 183 K/uL (ref 150–400)
RBC: 3.04 MIL/uL — ABNORMAL LOW (ref 3.87–5.11)
RDW: 17.3 % — ABNORMAL HIGH (ref 11.5–15.5)
WBC: 5.5 K/uL (ref 4.0–10.5)
nRBC: 0 % (ref 0.0–0.2)

## 2022-08-12 LAB — GLUCOSE, CAPILLARY
Glucose-Capillary: 93 mg/dL (ref 70–99)
Glucose-Capillary: 91 mg/dL (ref 70–99)
Glucose-Capillary: 92 mg/dL (ref 70–99)
Glucose-Capillary: 157 mg/dL — ABNORMAL HIGH (ref 70–99)

## 2022-08-12 LAB — MAGNESIUM: Magnesium: 2.1 mg/dL (ref 1.7–2.4)

## 2022-08-12 LAB — T4, FREE: Free T4: 0.77 ng/dL (ref 0.61–1.12)

## 2022-08-12 LAB — PHOSPHORUS: Phosphorus: 3.7 mg/dL (ref 2.5–4.6)

## 2022-08-12 MED ORDER — LEVOTHYROXINE SODIUM 75 MCG PO TABS
37.5000 ug | ORAL_TABLET | Freq: Every day | ORAL | Status: DC
  Administered 2022-08-12 – 2022-08-13 (×2): 37.5 ug via ORAL
  Filled 2022-08-12 (×2): qty 1

## 2022-08-12 MED ORDER — GLUCERNA SHAKE PO LIQD
237.0000 mL | Freq: Three times a day (TID) | ORAL | Status: DC
  Administered 2022-08-12 – 2022-08-13 (×3): 237 mL via ORAL

## 2022-08-12 MED ORDER — PROCHLORPERAZINE EDISYLATE 10 MG/2ML IJ SOLN: 10.0000 mg | Freq: Four times a day (QID) | INTRAMUSCULAR | Status: DC | PRN

## 2022-08-12 MED ORDER — ASPIRIN 81 MG PO TBEC
81.0000 mg | DELAYED_RELEASE_TABLET | Freq: Every evening | ORAL | Status: DC
  Administered 2022-08-12: 81 mg via ORAL
  Filled 2022-08-12: qty 1

## 2022-08-12 MED ORDER — FAMOTIDINE 20 MG PO TABS
20.0000 mg | ORAL_TABLET | Freq: Every day | ORAL | Status: DC
  Administered 2022-08-12: 20 mg via ORAL
  Filled 2022-08-12: qty 1

## 2022-08-12 MED ORDER — FUROSEMIDE 40 MG PO TABS
40.0000 mg | ORAL_TABLET | Freq: Every day | ORAL | Status: DC
  Administered 2022-08-12 – 2022-08-13 (×2): 40 mg via ORAL
  Filled 2022-08-12 (×2): qty 1

## 2022-08-12 MED ORDER — ISOSORBIDE MONONITRATE ER 30 MG PO TB24
15.0000 mg | ORAL_TABLET | Freq: Every day | ORAL | Status: DC
  Administered 2022-08-12 – 2022-08-13 (×2): 15 mg via ORAL
  Filled 2022-08-12 (×2): qty 1

## 2022-08-12 MED ORDER — METOPROLOL SUCCINATE ER 25 MG PO TB24
12.5000 mg | ORAL_TABLET | Freq: Every day | ORAL | Status: DC
  Administered 2022-08-12 – 2022-08-13 (×2): 12.5 mg via ORAL
  Filled 2022-08-12 (×2): qty 1

## 2022-08-12 MED ORDER — SIMVASTATIN 20 MG PO TABS
20.0000 mg | ORAL_TABLET | Freq: Every day | ORAL | Status: DC
  Administered 2022-08-12: 20 mg via ORAL
  Filled 2022-08-12: qty 1

## 2022-08-12 MED ORDER — HYDRALAZINE HCL 25 MG PO TABS
25.0000 mg | ORAL_TABLET | Freq: Three times a day (TID) | ORAL | Status: DC
  Administered 2022-08-12 – 2022-08-13 (×2): 25 mg via ORAL
  Filled 2022-08-12 (×2): qty 1

## 2022-08-12 MED ORDER — HEPARIN SODIUM (PORCINE) 5000 UNIT/ML IJ SOLN
5000.0000 [IU] | Freq: Three times a day (TID) | INTRAMUSCULAR | Status: DC
  Administered 2022-08-12 – 2022-08-13 (×3): 5000 [IU] via SUBCUTANEOUS
  Filled 2022-08-12 (×3): qty 1

## 2022-08-12 MED ORDER — INSULIN ASPART 100 UNIT/ML IJ SOLN
0.0000 [IU] | Freq: Three times a day (TID) | INTRAMUSCULAR | Status: DC
  Administered 2022-08-13: 2 [IU] via SUBCUTANEOUS

## 2022-08-12 MED ORDER — ACETAMINOPHEN 650 MG RE SUPP: 650.0000 mg | Freq: Four times a day (QID) | RECTAL | Status: DC | PRN

## 2022-08-12 MED ORDER — ACETAMINOPHEN 325 MG PO TABS
650.0000 mg | ORAL_TABLET | Freq: Four times a day (QID) | ORAL | Status: DC | PRN
  Administered 2022-08-12: 650 mg via ORAL
  Filled 2022-08-12: qty 2

## 2022-08-12 MED ORDER — TRAZODONE HCL 50 MG PO TABS
50.0000 mg | ORAL_TABLET | Freq: Every day | ORAL | Status: DC
  Administered 2022-08-12: 50 mg via ORAL
  Filled 2022-08-12: qty 1

## 2022-08-12 NOTE — [place] FL2 (Signed)
[place] MEDICAID FL2 LEVEL OF CARE FORM     IDENTIFICATION  Patient Name: Marie Hunt Birthdate: [birth year] Sex: female Admission Date (Current Location): 08/11/2022  [place] and [place] Number:  Aaron Edelman [id number] N Facility and Address:  [facility],  [address], [place] [address]      Provider Number: [id number]  Attending Physician Name and Address:  Shon Hale, MD  Relative Name and Phone Number:       Current Level of Care: Hospital Recommended Level of Care: Skilled Nursing Facility Prior Approval Number:    Date Approved/Denied:   PASRR Number: [id number]  Discharge Plan: SNF    Current Diagnoses: Patient Active Problem List   Diagnosis Date Noted   Hypoalbuminemia due to protein-calorie malnutrition (HCC) 08/12/2022   Acquired hypothyroidism 08/12/2022   Type 2 diabetes mellitus with hyperglycemia (HCC) 08/12/2022   Syncope and collapse 08/11/2022   Chronic combined systolic and diastolic CHF (congestive heart failure) (HCC) 07/07/2022   AKI (acute kidney injury) (HCC) 07/07/2022   Acute respiratory failure with hypoxia (HCC) 07/02/2022   GERD without esophagitis 07/02/2022   Thyroid disease 07/02/2022   Prolonged QT interval 07/02/2022   Acute on chronic congestive heart failure (HCC) 07/02/2022   Elevated troponin 07/02/2022   Acute cystitis with hematuria 04/06/2019   Malignant neoplasm of urinary bladder neck (HCC) 04/06/2019   Colitis, acute 04/24/2016   Cough 10/17/2014   Renal insufficiency 10/17/2014   UTI (urinary tract infection) 10/07/2014   Anemia 10/07/2014   Fever    Type 2 diabetes mellitus without complication (HCC)    Hip fracture (HCC) 10/02/2014   Hip fx (HCC) 10/02/2014   Type 2 diabetes mellitus (HCC)    Fall    Aftercare following surgery of the circulatory system, NEC 10/26/2013   Occlusion and stenosis of carotid artery without mention of cerebral infarction 09/17/2011   Preoperative  evaluation to rule out surgical contraindication 11/15/2010   Cardiac murmur 11/15/2010   Essential hypertension 11/08/2010   Carotid artery disease (HCC) 11/08/2010   Diabetes in pregnancy 11/08/2010   Hyperlipidemia with target low density lipoprotein (LDL) cholesterol less than 100 mg/dL 28/41/3244   Cholelithiasis 11/08/2010    Orientation RESPIRATION BLADDER Height & Weight     Self, Place  Normal Incontinent Weight:   Height:     BEHAVIORAL SYMPTOMS/MOOD NEUROLOGICAL BOWEL NUTRITION STATUS      Incontinent Diet (Heart healthy/carb modified. See d/c summary for updates.)  AMBULATORY STATUS COMMUNICATION OF NEEDS Skin   Extensive Assist Verbally Other (Comment) (Stage II to left buttocks with foam dressing.)                       Personal Care Assistance Level of Assistance  Bathing, Feeding, Dressing Bathing Assistance: Maximum assistance Feeding assistance: Limited assistance Dressing Assistance: Maximum assistance     Functional Limitations Info  Sight, Hearing, Speech Sight Info: Impaired Hearing Info: Impaired Speech Info: Adequate    SPECIAL CARE FACTORS FREQUENCY  PT (By licensed PT)     PT Frequency: 5x weekly              Contractures      Additional Factors Info  Code Status, Allergies, Psychotropic Code Status Info: Full code Allergies Info: Codeine Psychotropic Info: Trazodone         Current Medications (08/12/2022):  This is the current hospital active medication list Current Facility-Administered Medications  Medication Dose Route Frequency Provider Last Rate Last Admin   acetaminophen (  TYLENOL) tablet 650 mg  650 mg Oral Q6H PRN Adefeso, Oladapo, DO       Or   acetaminophen (TYLENOL) suppository 650 mg  650 mg Rectal Q6H PRN Adefeso, Oladapo, DO       aspirin EC tablet 81 mg  81 mg Oral QPM Adefeso, Oladapo, DO       famotidine (PEPCID) tablet 20 mg  20 mg Oral QHS Adefeso, Oladapo, DO       feeding supplement (GLUCERNA SHAKE)  (GLUCERNA SHAKE) liquid 237 mL  237 mL Oral TID BM Adefeso, Oladapo, DO   237 mL at 08/12/22 0936   furosemide (LASIX) tablet 40 mg  40 mg Oral Daily Adefeso, Oladapo, DO   40 mg at 08/12/22 0936   heparin injection 5,000 Units  5,000 Units Subcutaneous Q8H Adefeso, Oladapo, DO   5,000 Units at 08/12/22 0538   insulin aspart (novoLOG) injection 0-9 Units  0-9 Units Subcutaneous TID WC Adefeso, Oladapo, DO       isosorbide mononitrate (IMDUR) 24 hr tablet 15 mg  15 mg Oral Daily Adefeso, Oladapo, DO   15 mg at 08/12/22 0936   levothyroxine (SYNTHROID) tablet 37.5 mcg  37.5 mcg Oral Q0600 Adefeso, Oladapo, DO   37.5 mcg at 08/12/22 0538   metoprolol succinate (TOPROL-XL) 24 hr tablet 12.5 mg  12.5 mg Oral Daily Adefeso, Oladapo, DO   12.5 mg at 08/12/22 2440   prochlorperazine (COMPAZINE) injection 10 mg  10 mg Intravenous Q6H PRN Adefeso, Oladapo, DO       simvastatin (ZOCOR) tablet 20 mg  20 mg Oral QHS Adefeso, Oladapo, DO         Discharge Medications: Please see discharge summary for a list of discharge medications.  Relevant Imaging Results:  Relevant Lab Results:   Additional Information SSN: [id number]  Karn Cassis, LCSW

## 2022-08-12 NOTE — TOC Initial Note (Signed)
Transition of Care [facility]) - Initial/Assessment Note    Patient Details  Name: Marie Hunt MRN: [id number] Date of Birth: [birth year]  Transition of Care [facility]) CM/SW Contact:    Karn Cassis, LCSW Phone Number: 08/12/2022, 1:59 PM  Clinical Narrative:  Pt admitted due to syncope and collapse. Assessment completed with pt's granddaughter, [name] who reports she is HCPOA. [name] indicates at baseline pt is ambulatory. She lives alone but her daughter had been staying with her during the week and pt would stay with [facility] on weekends. Granddaughter feels pt will need placement. Discussed ALF vs SNF (with authorization or Medicaid). Awaiting PT evaluation. TOC will follow up when completed.                  Expected Discharge Plan:  (to be determined) Barriers to Discharge: Continued Medical Work up   Patient Goals and CMS Choice Patient states their goals for this hospitalization and ongoing recovery are:: to be determined   Choice offered to / list presented to : [facility] POA / Guardian [place] ownership interest in [facility].provided to:: [facility] POA / Guardian    Expected Discharge Plan and Services In-house Referral: Clinical Social Work     Living arrangements for the past 2 months: Single Family Home                                      Prior Living Arrangements/Services Living arrangements for the past 2 months: Single Family Home Lives with:: Self Patient language and need for interpreter reviewed:: Yes Do you feel safe going back to the place where you live?: Yes      Need for Family Participation in Patient Care: Yes (Comment) Care giver support system in place?: Yes (comment) Current home services: DME, Home OT, Home PT, Home RN (wheelchair, walker, cane, shower chair) Criminal Activity/Legal Involvement Pertinent to Current Situation/Hospitalization: No - Comment as needed  Activities of Daily Living Home Assistive Devices/Equipment:  Cane (specify quad or straight) ADL Screening (condition at time of admission) Patient's cognitive ability adequate to safely complete daily activities?: No Is the patient deaf or have difficulty hearing?: No Does the patient have difficulty seeing, even when wearing glasses/contacts?: No Does the patient have difficulty concentrating, remembering, or making decisions?: No Patient able to express need for assistance with ADLs?: Yes Does the patient have difficulty dressing or bathing?: Yes Independently performs ADLs?: No Communication: Independent Dressing (OT): Needs assistance Is this a change from baseline?: Pre-admission baseline Grooming: Needs assistance Is this a change from baseline?: Pre-admission baseline Feeding: Independent Bathing: Needs assistance Is this a change from baseline?: Pre-admission baseline Toileting: Needs assistance Is this a change from baseline?: Pre-admission baseline In/Out Bed: Needs assistance Is this a change from baseline?: Pre-admission baseline Walks in Home: Needs assistance Is this a change from baseline?: Pre-admission baseline Does the patient have difficulty walking or climbing stairs?: Yes Weakness of Legs: Left Weakness of Arms/Hands: Both  Permission Sought/Granted                  Emotional Assessment   Attitude/Demeanor/Rapport: Unable to Assess Affect (typically observed): Unable to Assess   Alcohol / Substance Use: Not Applicable Psych Involvement: No (comment)  Admission diagnosis:  Syncope and collapse [R55] Acute cystitis without hematuria [N30.00] Symptomatic anemia [D64.9] Syncope, unspecified syncope type [R55] Patient Active Problem List   Diagnosis Date Noted  Hypoalbuminemia due to protein-calorie malnutrition (HCC) 08/12/2022   Acquired hypothyroidism 08/12/2022   Type 2 diabetes mellitus with hyperglycemia (HCC) 08/12/2022   Syncope and collapse 08/11/2022   Chronic combined systolic and diastolic CHF  (congestive heart failure) (HCC) 07/07/2022   AKI (acute kidney injury) (HCC) 07/07/2022   Acute respiratory failure with hypoxia (HCC) 07/02/2022   GERD without esophagitis 07/02/2022   Thyroid disease 07/02/2022   Prolonged QT interval 07/02/2022   Acute on chronic congestive heart failure (HCC) 07/02/2022   Elevated troponin 07/02/2022   Acute cystitis with hematuria 04/06/2019   Malignant neoplasm of urinary bladder neck (HCC) 04/06/2019   Colitis, acute 04/24/2016   Cough 10/17/2014   Renal insufficiency 10/17/2014   UTI (urinary tract infection) 10/07/2014   Anemia 10/07/2014   Fever    Type 2 diabetes mellitus without complication (HCC)    Hip fracture (HCC) 10/02/2014   Hip fx (HCC) 10/02/2014   Type 2 diabetes mellitus (HCC)    Fall    Aftercare following surgery of the circulatory system, NEC 10/26/2013   Occlusion and stenosis of carotid artery without mention of cerebral infarction 09/17/2011   Preoperative evaluation to rule out surgical contraindication 11/15/2010   Cardiac murmur 11/15/2010   Essential hypertension 11/08/2010   Carotid artery disease (HCC) 11/08/2010   Diabetes in pregnancy 11/08/2010   Hyperlipidemia with target low density lipoprotein (LDL) cholesterol less than 100 mg/dL 16/11/9602   Cholelithiasis 11/08/2010   PCP:  Kirstie Peri, MD Pharmacy:   [facility] - [place], [place] - [address] [place] [address] Phone: [phone] Fax: [phone]     Social Determinants of Health (SDOH) Social History: SDOH Screenings   Food Insecurity: No Food Insecurity (08/12/2022)  Housing: Patient Declined (08/12/2022)  Transportation Needs: No Transportation Needs (08/12/2022)  Utilities: Not At Risk (08/12/2022)  Tobacco Use: Medium Risk (08/11/2022)   SDOH Interventions:     Readmission Risk Interventions     No data to display

## 2022-08-12 NOTE — Progress Notes (Signed)
Message sent to MD on call for admit orders.

## 2022-08-12 NOTE — TOC Progression Note (Signed)
Transition of Care [facility]) - Progression Note    Patient Details  Name: Marie Hunt MRN: [id number] Date of Birth: 22-Jun-1932  Transition of Care [facility]) CM/SW Contact  Karn Cassis, [place] Phone Number: 08/12/2022, 3:11 PM  Clinical Narrative:  PT recommending SNF. Discussed with granddaughter who is agreeable. Will initiate bed search in [facility]. Discussed Medicare.gov ratings. CMA starting authorization.      Expected Discharge Plan:  (to be determined) Barriers to Discharge: Continued Medical Work up  Expected Discharge Plan and Services In-house Referral: Clinical Social Work     Living arrangements for the past 2 months: Single Family Home                                       Social Determinants of Health (SDOH) Interventions SDOH Screenings   Food Insecurity: No Food Insecurity (08/12/2022)  Housing: Patient Declined (08/12/2022)  Transportation Needs: No Transportation Needs (08/12/2022)  Utilities: Not At Risk (08/12/2022)  Tobacco Use: Medium Risk (08/11/2022)    Readmission Risk Interventions     No data to display

## 2022-08-12 NOTE — Progress Notes (Signed)
Patient bought to the floor by ED RN. Patient answered most of my screening questions appropriately. Patient in bed, bed in lowest position, call bell within reach, bed alarm turned on.

## 2022-08-12 NOTE — Progress Notes (Signed)
PROGRESS NOTE     Marie Hunt, is a 89 y.o. female, DOB - [birth year], AOZ:[id number]  Admit date - 08/11/2022   Admitting Physician Frankey Shown, DO  Outpatient Primary MD for the patient is Kirstie Peri, MD  LOS - 0  Chief Complaint  Patient presents with   Loss of Consciousness        Brief Narrative:  89 y.o. female with medical history significant of hypertension, GERD, T2DM, hypothyroidism, hyperlipidemia, CAD admitted on 09/10/2022 with syncope    -Assessment and Plan: 1)Syncope- on telemetry monitored unit no significant arrhythmias,  -Ruled out for ACS by serial troponins and EKG - echocardiogram with mild to moderate aortic stenosis , largely unchanged from prior , no other outflow obstruction, and EF is 50 to 55% (up from 30 to 35% back in May 2024) with mild concentric LVH and wall motion abnormalities noted (not new compared to echo from May 2024) -carotid artery Dopplers to rule out hemodynamically significant stenosis is pending  HFrmEF--EF is 50 to 55% (up from 30 to 35% back in May 2024)  -Continue lasix and  Toprol-XL, aspirin and centralizing/isosorbide combo in lieu of ACEI/ARB due to renal dysfunction  Social/Ethics-- Discussed with grand daughter -[name]   Generalized weakness/ambulatory dysfunction--- physical therapist recommends SNF rehab, patient and family agreeable Phy therapy evaluations performed. SNF Rehab recommended. SNF appropriate as   is felt to need rehab services to restore this patient to their prior level of function to achieve safe transition back to home care. This patient needs rehab services for at least 5 days per week and skilled nursing services daily to facilitate this transition. Rehab is being requested as the most appropriate d/c option for this patient and is NOT felt to be for custodial care as evidenced by previously independent with ADLs including not limited to bathing, toileting, feeding and grooming prior  to admission   Dementia with behavioral disturbance--- supportive care -Trazodone nightly  Hypothyroidism--- continue levothyroxine  DM2--recent A1c 7.7 reflecting uncontrolled DM with hyperglycemia PTA -Hold glimepiride Use Novolog/Humalog Sliding scale insulin with Accu-Cheks/Fingersticks as ordered   GERD--continue Pepcid  Disposition: The patient is from: Home              Anticipated d/c is to: SNF              Anticipated d/c date is: 1 day              Patient currently is not medically stable to d/c. Barriers: Not Clinically Stable-   Code Status :  -  Code Status: Full Code   Family Communication:    Discussed with grand daughter -[name]   DVT Prophylaxis  :   - SCDs heparin injection 5,000 Units Start: 08/12/22 0600 SCDs Start: 08/12/22 0427   Lab Results  Component Value Date   PLT 183 08/12/2022    Inpatient Medications  Scheduled Meds:  aspirin EC  81 mg Oral QPM   famotidine  20 mg Oral QHS   feeding supplement (GLUCERNA SHAKE)  237 mL Oral TID BM   furosemide  40 mg Oral Daily   heparin  5,000 Units Subcutaneous Q8H   insulin aspart  0-9 Units Subcutaneous TID WC   isosorbide mononitrate  15 mg Oral Daily   levothyroxine  37.5 mcg Oral Q0600   metoprolol succinate  12.5 mg Oral Daily   simvastatin  20 mg Oral QHS   Continuous Infusions: PRN Meds:.acetaminophen **OR** acetaminophen, prochlorperazine   Anti-infectives (From  admission, onward)    Start     Dose/Rate Route Frequency Ordered Stop   08/11/22 2145  cefTRIAXone (ROCEPHIN) 1 g in sodium chloride 0.9 % 100 mL IVPB        1 g 200 mL/hr over 30 Minutes Intravenous  Once 08/11/22 2144 08/11/22 2229         Subjective: Davana Pradia today has no fevers, no emesis,  No chest pain,   - Mostly cooperative Gait concerns persist   Objective: Vitals:   08/11/22 2301 08/12/22 0035 08/12/22 0401 08/12/22 1124  BP: (!) 133/48 (!) 150/67 (!) 137/42 (!) 134/104  Pulse: 77  (!) 56 63 71  Resp: 16 16 16 18   Temp: 98 F (36.7 C)  98.4 F (36.9 C) 97.8 F (36.6 C)  TempSrc: Oral  Oral   SpO2: 98% 99% 98% 99%    Intake/Output Summary (Last 24 hours) at 08/12/2022 1852 Last data filed at 08/12/2022 1834 Gross per 24 hour  Intake 960 ml  Output 350 ml  Net 610 ml   There were no vitals filed for this visit.  Physical Exam  Gen:- Awake Alert,  in no apparent distress  HEENT:- [place].AT, No sclera icterus Neck-Supple Neck,No JVD,.  Lungs-  CTAB , fair symmetrical air movement CV- S1, S2 normal, regular  Abd-  +ve B.Sounds, Abd Soft, No tenderness,    Extremity/Skin:- No  edema, pedal pulses present  Psych-affect is appropriate, cognitive and memory deficits persist  neuro-generalized weakness, no new focal deficits, no tremors  Data Reviewed: I have personally reviewed following labs and imaging studies  CBC: Recent Labs  Lab 08/11/22 1440 08/12/22 0455  WBC 8.0 5.5  HGB 8.7* 7.9*  HCT 29.0* 25.6*  MCV 86.8 84.2  PLT 204 183   Basic Metabolic Panel: Recent Labs  Lab 08/11/22 1440 08/12/22 0455  NA 136 137  K 4.9 4.4  CL 104 104  CO2 22 23  GLUCOSE 175* 114*  BUN 61* 54*  CREATININE 2.91* 2.57*  CALCIUM 9.4 8.9  MG 2.4 2.1  PHOS  --  3.7   GFR: CrCl cannot be calculated (Unknown ideal weight.). Liver Function Tests: Recent Labs  Lab 08/11/22 1440 08/12/22 0455  AST 22 17  ALT 16 13  ALKPHOS 98 81  BILITOT 0.6 0.4  PROT 6.6 6.1*  ALBUMIN 2.7* 2.4*   Radiology Studies: ECHOCARDIOGRAM LIMITED  Result Date: 08/12/2022    ECHOCARDIOGRAM LIMITED REPORT   Patient Name:   Marie Hunt Date of Exam: 08/12/2022 Medical Rec #:  [id number]          Height:       64.0 in Accession #:    [id number]         Weight:       125.0 lb Date of Birth:  [birth year]          BSA:          1.602 m Patient Age:    89 years           BP:           137/42 mmHg Patient Gender: F                  HR:           63 bpm. [facility]  Procedure: Limited Echo, Cardiac Doppler and Limited Color Doppler Indications:    Syncope R55, evaluate AS, LVEF and WMA  History:  Patient has prior history of Echocardiogram examinations, most                 recent 07/03/2022. CHF, Aortic Valve Disease; Risk                 Factors:Hypertension, Diabetes and Dyslipidemia.  Sonographer:    Celesta Gentile RCS Referring Phys: [id number] OLADAPO ADEFESO IMPRESSIONS  1. Left ventricular ejection fraction, by estimation, is 50 to 55%. The left ventricle has low normal function. The left ventricle demonstrates regional wall motion abnormalities (see scoring diagram/findings for description). There is mild concentric left ventricular hypertrophy.  2. Right ventricular systolic function is normal. The right ventricular size is normal. Tricuspid regurgitation signal is inadequate for assessing PA pressure.  3. The mitral valve is degenerative. Mild mitral valve regurgitation.  4. The aortic valve is tricuspid. There is moderate calcification of the aortic valve. Aortic valve regurgitation is mild. Mild to moderate aortic valve stenosis. Aortic valve mean gradient measures 15.4 mmHg. Dimentionless index 0.43.  5. The inferior vena cava is normal in size with greater than 50% respiratory variability, suggesting right atrial pressure of 3 mmHg. Comparison(s): Prior images reviewed side by side. LVEF has improved relative to prior study, 50-55% range. Stable mild to moderate aortic stenosis. FINDINGS  Left Ventricle: Left ventricular ejection fraction, by estimation, is 50 to 55%. The left ventricle has low normal function. The left ventricle demonstrates regional wall motion abnormalities. The left ventricular internal cavity size was normal in size. There is mild concentric left ventricular hypertrophy.  LV Wall Scoring: The basal inferior segment is hypokinetic. The entire anterior wall, entire lateral wall, entire septum, entire apex, and mid and distal inferior wall are  normal. Right Ventricle: The right ventricular size is normal. No increase in right ventricular wall thickness. Right ventricular systolic function is normal. Tricuspid regurgitation signal is inadequate for assessing PA pressure. Left Atrium: Left atrial size was normal in size. Right Atrium: Right atrial size was normal in size. Mitral Valve: The mitral valve is degenerative in appearance. Mild to moderate mitral annular calcification. Mild mitral valve regurgitation. Tricuspid Valve: The tricuspid valve is grossly normal. Tricuspid valve regurgitation is trivial. Aortic Valve: The aortic valve is tricuspid. There is moderate calcification of the aortic valve. There is mild aortic valve annular calcification. Aortic valve regurgitation is mild. Aortic regurgitation PHT measures 416 msec. Mild to moderate aortic stenosis is present. Aortic valve mean gradient measures 15.4 mmHg. Aortic valve peak gradient measures 24.4 mmHg. Aortic valve area, by VTI measures 1.35 cm. Pulmonic Valve: The pulmonic valve was grossly normal. Pulmonic valve regurgitation is trivial. Aorta: The aortic root is normal in size and structure. Venous: The inferior vena cava is normal in size with greater than 50% respiratory variability, suggesting right atrial pressure of 3 mmHg. IAS/Shunts: No atrial level shunt detected by color flow Doppler. LEFT VENTRICLE PLAX 2D LVIDd:         4.70 cm LVIDs:         3.80 cm LV PW:         1.30 cm LV IVS:        1.00 cm LVOT diam:     2.00 cm LV SV:         83 LV SV Index:   52 LVOT Area:     3.14 cm  LEFT ATRIUM         Index LA diam:    4.20 cm 2.62 cm/m  AORTIC VALVE AV Area (  Vmax):    1.22 cm AV Area (Vmean):   1.13 cm AV Area (VTI):     1.35 cm AV Vmax:           247.00 cm/s AV Vmean:          186.600 cm/s AV VTI:            0.618 m AV Peak Grad:      24.4 mmHg AV Mean Grad:      15.4 mmHg LVOT Vmax:         96.00 cm/s LVOT Vmean:        67.400 cm/s LVOT VTI:          0.265 m LVOT/AV VTI  ratio: 0.43 AI PHT:            416 msec  AORTA Ao Root diam: 2.90 cm  SHUNTS Systemic VTI:  0.26 m Systemic Diam: 2.00 cm Nona Dell MD Electronically signed by Nona Dell MD Signature Date/Time: 08/12/2022/10:30:52 AM    Final    CT HEAD WO CONTRAST  Result Date: 08/11/2022 CLINICAL DATA:  Syncope EXAM: CT HEAD WITHOUT CONTRAST TECHNIQUE: Contiguous axial images were obtained from the base of the skull through the vertex without intravenous contrast. RADIATION DOSE REDUCTION: This exam was performed according to the departmental dose-optimization program which includes automated exposure control, adjustment of the mA and/or kV according to patient size and/or use of iterative reconstruction technique. COMPARISON:  None Available. FINDINGS: Brain: No acute territorial infarction, hemorrhage or intracranial mass. Moderate atrophy. Moderate white matter hypodensity consistent with chronic small vessel ischemic change. Nonenlarged ventricles Vascular: No hyperdense vessels. Dense carotid vascular calcification. Skull: Normal. Negative for fracture or focal lesion. Sinuses/Orbits: No acute finding. Other: None IMPRESSION: 1. No CT evidence for acute intracranial abnormality. 2. Atrophy and chronic small vessel ischemic changes of the white matter. Electronically Signed   By: Jasmine Pang M.D.   On: 08/11/2022 18:10   US Venous Img Lower Unilateral Left  Result Date: 08/11/2022 CLINICAL DATA:  Left lower extremity edema. Former smoker. Evaluate for DVT. EXAM: LEFT LOWER EXTREMITY VENOUS DOPPLER ULTRASOUND TECHNIQUE: Gray-scale sonography with graded compression, as well as color Doppler and duplex ultrasound were performed to evaluate the lower extremity deep venous systems from the level of the common femoral vein and including the common femoral, femoral, profunda femoral, popliteal and calf veins including the posterior tibial, peroneal and gastrocnemius veins when visible. The superficial great  saphenous vein was also interrogated. Spectral Doppler was utilized to evaluate flow at rest and with distal augmentation maneuvers in the common femoral, femoral and popliteal veins. COMPARISON:  None Available. FINDINGS: Contralateral Common Femoral Vein: Respiratory phasicity is normal and symmetric with the symptomatic side. No evidence of thrombus. Normal compressibility. Common Femoral Vein: No evidence of thrombus. Normal compressibility, respiratory phasicity and response to augmentation. Saphenofemoral Junction: No evidence of thrombus. Normal compressibility and flow on color Doppler imaging. Profunda Femoral Vein: No evidence of thrombus. Normal compressibility and flow on color Doppler imaging. Femoral Vein: No evidence of thrombus. Normal compressibility, respiratory phasicity and response to augmentation. Popliteal Vein: No evidence of thrombus. Normal compressibility, respiratory phasicity and response to augmentation. Calf Veins: No evidence of thrombus. Normal compressibility and flow on color Doppler imaging. Superficial Great Saphenous Vein: No evidence of thrombus. Normal compressibility. Other Findings:  None. IMPRESSION: No evidence of DVT within the left lower extremity. Electronically Signed   By: Simonne Come M.D.   On: 08/11/2022 16:22  DG Chest Portable 1 View  Result Date: 08/11/2022 CLINICAL DATA:  Syncope EXAM: PORTABLE CHEST 1 VIEW COMPARISON:  Chest x-ray 07/03/2022 FINDINGS: Cardiomediastinal silhouette is stable, the heart is mildly enlarged. There is no focal lung infiltrate, pleural effusion or pneumothorax. There are scattered chronic appearing interstitial opacities bilaterally. No acute fractures are seen. IMPRESSION: 1. No active disease. 2. Chronic appearing interstitial opacities bilaterally. Electronically Signed   By: Darliss Cheney M.D.   On: 08/11/2022 15:35     Scheduled Meds:  aspirin EC  81 mg Oral QPM   famotidine  20 mg Oral QHS   feeding supplement  (GLUCERNA SHAKE)  237 mL Oral TID BM   furosemide  40 mg Oral Daily   heparin  5,000 Units Subcutaneous Q8H   insulin aspart  0-9 Units Subcutaneous TID WC   isosorbide mononitrate  15 mg Oral Daily   levothyroxine  37.5 mcg Oral Q0600   metoprolol succinate  12.5 mg Oral Daily   simvastatin  20 mg Oral QHS   Continuous Infusions:   LOS: 0 days    Shon Hale M.D on 08/12/2022 at 6:52 PM  Go to [email or web address] - for contact info  [facility] - Office  [phone]  If 7PM-7AM, please contact night-coverage [email or web address] 08/12/2022, 6:52 PM

## 2022-08-12 NOTE — Evaluation (Signed)
Physical Therapy Evaluation Patient Details Name: Marie Hunt MRN: [id number] DOB: July 29, 1932 Today's Date: 08/12/2022  History of Present Illness  Marie Hunt is a 89 y.o. female with medical history significant of hypertension, GERD, T2DM, hypothyroidism, hyperlipidemia, CAD who presents to the emergency department via EMS from home due to syncope.  Patient was unable to provide history, history was obtained from ED physician and ED medical record.  Per report, patient was reported to suddenly became unresponsive today around 2:30 PM while sitting, but she quickly recovered without any confusion after recovery.  EMS was activated, but prior to arrival of EMS team, patient slumped over and became unresponsive again.  She woke up when EMS team arrived, she was agitated and confused.  Patient was currently taking Keflex due to recent UTI diagnosis.  No fever, chest pain, shortness of breath was reported.   Clinical Impression  Patient requires repeated verbal/tactile cueing for participating with therapy demonstrating slow labored movement for sitting up at bedside, unsteady on feet requiring use of RW for safety, limited for gait training mostly due to c/o fatigue and generalized weakness.  Patient requested to go back to bed after therapy.  Patient will benefit from continued skilled physical therapy in hospital and recommended venue below to increase strength, balance, endurance for safe ADLs and gait.      Recommendations for follow up therapy are one component of a multi-disciplinary discharge planning process, led by the attending physician.  Recommendations may be updated based on patient status, additional functional criteria and insurance authorization.  Follow Up Recommendations Can patient physically be transported by private vehicle: Yes     Assistance Recommended at Discharge Intermittent Supervision/Assistance  Patient can return home with the following  A lot of help  with bathing/dressing/bathroom;A lot of help with walking and/or transfers;Help with stairs or ramp for entrance;Assistance with cooking/housework    Equipment Recommendations None recommended by PT  Recommendations for Other Services       Functional Status Assessment Patient has had a recent decline in their functional status and demonstrates the ability to make significant improvements in function in a reasonable and predictable amount of time.     Precautions / Restrictions Precautions Precautions: Fall Restrictions Weight Bearing Restrictions: No      Mobility  Bed Mobility Overal bed mobility: Needs Assistance Bed Mobility: Supine to Sit, Sit to Supine     Supine to sit: Min assist Sit to supine: Min assist, Mod assist   General bed mobility comments: increased time, labored movement    Transfers Overall transfer level: Needs assistance Equipment used: Rolling walker (2 wheels) Transfers: Sit to/from Stand, Bed to chair/wheelchair/BSC Sit to Stand: Min assist   Step pivot transfers: Min assist       General transfer comment: unsteady labored movement    Ambulation/Gait Ambulation/Gait assistance: Min assist, Mod assist Gait Distance (Feet): 30 Feet Assistive device: Rolling walker (2 wheels) Gait Pattern/deviations: Decreased step length - left, Decreased stance time - right, Decreased stride length, Trunk flexed Gait velocity: decreased     General Gait Details: slow labored cadence having to lean on RW for support due to weakess and limited mostly due to c/o fatigue  Stairs            Wheelchair Mobility    Modified Rankin (Stroke Patients Only)       Balance Overall balance assessment: Needs assistance Sitting-balance support: Feet supported Sitting balance-Leahy Scale: Fair Sitting balance - Comments: seated at EOB  Standing balance support: During functional activity, Reliant on assistive device for balance, Bilateral upper extremity  supported Standing balance-Leahy Scale: Poor Standing balance comment: fair/poor using RW                             Pertinent Vitals/Pain Pain Assessment Pain Assessment: No/denies pain    Home Living Family/patient expects to be discharged to:: Private residence Living Arrangements: Alone Available Help at Discharge: Family;Available PRN/intermittently Type of Home: Apartment Home Access: Level entry       Home Layout: One level Home Equipment: Agricultural consultant (2 wheels);Cane - single point;Wheelchair - manual;Grab bars - tub/shower;Shower seat      Prior Function Prior Level of Function : Needs assist       Physical Assist : Mobility (physical);ADLs (physical) Mobility (physical): Bed mobility;Transfers;Gait;Stairs   Mobility Comments: household ambulator using cane, uses w/c for longer distances ADLs Comments: assisted by family     Hand Dominance   Dominant Hand: Right    Extremity/Trunk Assessment   Upper Extremity Assessment Upper Extremity Assessment: Generalized weakness    Lower Extremity Assessment Lower Extremity Assessment: Generalized weakness    Cervical / Trunk Assessment Cervical / Trunk Assessment: Kyphotic  Communication   Communication: HOH  Cognition Arousal/Alertness: Awake/alert Behavior During Therapy: Agitated Overall Cognitive Status: No family/caregiver present to determine baseline cognitive functioning                                          General Comments      Exercises     Assessment/Plan    PT Assessment Patient needs continued PT services  PT Problem List Decreased strength;Decreased activity tolerance;Decreased balance;Decreased mobility       PT Treatment Interventions DME instruction;Gait training;Stair training;Functional mobility training;Therapeutic exercise;Patient/family education;Balance training    PT Goals (Current goals can be found in the Care Plan section)  Acute  Rehab PT Goals Patient Stated Goal: return home with family to assist PT Goal Formulation: With patient Time For Goal Achievement: 08/26/22 Potential to Achieve Goals: Good    Frequency Min 3X/week     Co-evaluation               AM-PAC PT "6 Clicks" Mobility  Outcome Measure Help needed turning from your back to your side while in a flat bed without using bedrails?: A Little Help needed moving from lying on your back to sitting on the side of a flat bed without using bedrails?: A Little Help needed moving to and from a bed to a chair (including a wheelchair)?: A Lot Help needed standing up from a chair using your arms (e.g., wheelchair or bedside chair)?: A Little Help needed to walk in hospital room?: A Lot Help needed climbing 3-5 steps with a railing? : A Lot 6 Click Score: 15    End of Session   Activity Tolerance: Patient tolerated treatment well;Patient limited by fatigue Patient left: in bed;with call bell/phone within reach Nurse Communication: Mobility status PT Visit Diagnosis: Unsteadiness on feet (R26.81);Other abnormalities of gait and mobility (R26.89);Muscle weakness (generalized) (M62.81)    Time: 4782-9562 PT Time Calculation (min) (ACUTE ONLY): 30 min   Charges:   PT Evaluation $PT Eval Moderate Complexity: 1 Mod PT Treatments $Therapeutic Activity: 23-37 mins        3:46 PM, 08/12/22 [name], MPT Physical Therapist  with [facility] 336 [phone] office [phone] mobile phone

## 2022-08-12 NOTE — Plan of Care (Signed)
  Problem: Acute Rehab PT Goals(only PT should resolve) Goal: Pt Will Go Supine/Side To Sit Outcome: Progressing Flowsheets (Taken 08/12/2022 1548) Pt will go Supine/Side to Sit:  with min guard assist  with minimal assist Goal: Patient Will Transfer Sit To/From Stand Outcome: Progressing Flowsheets (Taken 08/12/2022 1548) Patient will transfer sit to/from stand:  with min guard assist  with minimal assist Goal: Pt Will Transfer Bed To Chair/Chair To Bed Outcome: Progressing Flowsheets (Taken 08/12/2022 1548) Pt will Transfer Bed to Chair/Chair to Bed:  min guard assist  with min assist Goal: Pt Will Ambulate Outcome: Progressing Flowsheets (Taken 08/12/2022 1548) Pt will Ambulate:  50 feet  with minimal assist  with min guard assist  with rolling walker   3:48 PM, 08/12/22 [name], MPT Physical Therapist with [facility] [phone] office [phone] mobile phone

## 2022-08-12 NOTE — Progress Notes (Addendum)
Patient is resting in her bed at this time. No complaints of pain or discomfort. Lips are dry and cracked. I applied petroleum jelly for comfort.  Patient took her 0600 medications with no problems. AOx2. Plan of care ongoing.

## 2022-08-12 NOTE — Progress Notes (Signed)
*  PRELIMINARY RESULTS* Echocardiogram Limited 2-D Echocardiogram  has been performed.  Stacey Drain 08/12/2022, 10:26 AM

## 2022-08-13 DIAGNOSIS — R55 Syncope and collapse: Secondary | ICD-10-CM | POA: Diagnosis not present

## 2022-08-13 DIAGNOSIS — E8809 Other disorders of plasma-protein metabolism, not elsewhere classified: Secondary | ICD-10-CM | POA: Diagnosis not present

## 2022-08-13 DIAGNOSIS — F03918 Unspecified dementia, unspecified severity, with other behavioral disturbance: Secondary | ICD-10-CM | POA: Diagnosis not present

## 2022-08-13 DIAGNOSIS — I129 Hypertensive chronic kidney disease with stage 1 through stage 4 chronic kidney disease, or unspecified chronic kidney disease: Secondary | ICD-10-CM | POA: Diagnosis not present

## 2022-08-13 DIAGNOSIS — E1159 Type 2 diabetes mellitus with other circulatory complications: Secondary | ICD-10-CM | POA: Diagnosis not present

## 2022-08-13 DIAGNOSIS — F0150 Vascular dementia without behavioral disturbance: Secondary | ICD-10-CM | POA: Diagnosis not present

## 2022-08-13 DIAGNOSIS — R488 Other symbolic dysfunctions: Secondary | ICD-10-CM | POA: Diagnosis not present

## 2022-08-13 DIAGNOSIS — I5042 Chronic combined systolic (congestive) and diastolic (congestive) heart failure: Secondary | ICD-10-CM | POA: Diagnosis not present

## 2022-08-13 DIAGNOSIS — N179 Acute kidney failure, unspecified: Secondary | ICD-10-CM | POA: Diagnosis not present

## 2022-08-13 DIAGNOSIS — E1165 Type 2 diabetes mellitus with hyperglycemia: Secondary | ICD-10-CM | POA: Diagnosis not present

## 2022-08-13 DIAGNOSIS — Z7982 Long term (current) use of aspirin: Secondary | ICD-10-CM | POA: Diagnosis not present

## 2022-08-13 DIAGNOSIS — Z79899 Other long term (current) drug therapy: Secondary | ICD-10-CM | POA: Diagnosis not present

## 2022-08-13 DIAGNOSIS — I5043 Acute on chronic combined systolic (congestive) and diastolic (congestive) heart failure: Secondary | ICD-10-CM | POA: Diagnosis not present

## 2022-08-13 DIAGNOSIS — Z96642 Presence of left artificial hip joint: Secondary | ICD-10-CM | POA: Diagnosis not present

## 2022-08-13 DIAGNOSIS — I130 Hypertensive heart and chronic kidney disease with heart failure and stage 1 through stage 4 chronic kidney disease, or unspecified chronic kidney disease: Secondary | ICD-10-CM | POA: Diagnosis not present

## 2022-08-13 DIAGNOSIS — D649 Anemia, unspecified: Secondary | ICD-10-CM | POA: Diagnosis not present

## 2022-08-13 DIAGNOSIS — N186 End stage renal disease: Secondary | ICD-10-CM | POA: Diagnosis not present

## 2022-08-13 DIAGNOSIS — I10 Essential (primary) hypertension: Secondary | ICD-10-CM | POA: Diagnosis not present

## 2022-08-13 DIAGNOSIS — F0390 Unspecified dementia without behavioral disturbance: Secondary | ICD-10-CM | POA: Diagnosis not present

## 2022-08-13 DIAGNOSIS — E1322 Other specified diabetes mellitus with diabetic chronic kidney disease: Secondary | ICD-10-CM | POA: Diagnosis not present

## 2022-08-13 DIAGNOSIS — Z87891 Personal history of nicotine dependence: Secondary | ICD-10-CM | POA: Diagnosis not present

## 2022-08-13 DIAGNOSIS — E1122 Type 2 diabetes mellitus with diabetic chronic kidney disease: Secondary | ICD-10-CM | POA: Diagnosis not present

## 2022-08-13 DIAGNOSIS — Z8739 Personal history of other diseases of the musculoskeletal system and connective tissue: Secondary | ICD-10-CM | POA: Diagnosis not present

## 2022-08-13 DIAGNOSIS — Z9181 History of falling: Secondary | ICD-10-CM | POA: Diagnosis not present

## 2022-08-13 DIAGNOSIS — R600 Localized edema: Secondary | ICD-10-CM | POA: Diagnosis not present

## 2022-08-13 DIAGNOSIS — R2681 Unsteadiness on feet: Secondary | ICD-10-CM | POA: Diagnosis not present

## 2022-08-13 DIAGNOSIS — R770 Abnormality of albumin: Secondary | ICD-10-CM | POA: Diagnosis not present

## 2022-08-13 DIAGNOSIS — R262 Difficulty in walking, not elsewhere classified: Secondary | ICD-10-CM | POA: Diagnosis not present

## 2022-08-13 DIAGNOSIS — R7989 Other specified abnormal findings of blood chemistry: Secondary | ICD-10-CM | POA: Diagnosis present

## 2022-08-13 DIAGNOSIS — Z8551 Personal history of malignant neoplasm of bladder: Secondary | ICD-10-CM | POA: Diagnosis not present

## 2022-08-13 DIAGNOSIS — Z7984 Long term (current) use of oral hypoglycemic drugs: Secondary | ICD-10-CM | POA: Diagnosis not present

## 2022-08-13 DIAGNOSIS — I110 Hypertensive heart disease with heart failure: Secondary | ICD-10-CM | POA: Diagnosis not present

## 2022-08-13 DIAGNOSIS — M6281 Muscle weakness (generalized): Secondary | ICD-10-CM | POA: Diagnosis not present

## 2022-08-13 DIAGNOSIS — E46 Unspecified protein-calorie malnutrition: Secondary | ICD-10-CM | POA: Diagnosis not present

## 2022-08-13 DIAGNOSIS — E785 Hyperlipidemia, unspecified: Secondary | ICD-10-CM | POA: Diagnosis not present

## 2022-08-13 DIAGNOSIS — I120 Hypertensive chronic kidney disease with stage 5 chronic kidney disease or end stage renal disease: Secondary | ICD-10-CM | POA: Diagnosis not present

## 2022-08-13 DIAGNOSIS — R1312 Dysphagia, oropharyngeal phase: Secondary | ICD-10-CM | POA: Diagnosis not present

## 2022-08-13 DIAGNOSIS — N184 Chronic kidney disease, stage 4 (severe): Secondary | ICD-10-CM | POA: Diagnosis not present

## 2022-08-13 DIAGNOSIS — E039 Hypothyroidism, unspecified: Secondary | ICD-10-CM | POA: Diagnosis not present

## 2022-08-13 DIAGNOSIS — D631 Anemia in chronic kidney disease: Secondary | ICD-10-CM | POA: Diagnosis not present

## 2022-08-13 DIAGNOSIS — K219 Gastro-esophageal reflux disease without esophagitis: Secondary | ICD-10-CM | POA: Diagnosis not present

## 2022-08-13 DIAGNOSIS — Z992 Dependence on renal dialysis: Secondary | ICD-10-CM | POA: Diagnosis not present

## 2022-08-13 DIAGNOSIS — I2510 Atherosclerotic heart disease of native coronary artery without angina pectoris: Secondary | ICD-10-CM | POA: Diagnosis not present

## 2022-08-13 LAB — GLUCOSE, CAPILLARY
Glucose-Capillary: 88 mg/dL (ref 70–99)
Glucose-Capillary: 172 mg/dL — ABNORMAL HIGH (ref 70–99)

## 2022-08-13 MED ORDER — ACETAMINOPHEN 325 MG PO TABS: 650.0000 mg | ORAL_TABLET | Freq: Four times a day (QID) | ORAL | Status: DC | PRN

## 2022-08-13 MED ORDER — ADULT MULTIVITAMIN W/MINERALS CH: 1.0000 | ORAL_TABLET | Freq: Every day | ORAL | Status: DC

## 2022-08-13 MED ORDER — LINAGLIPTIN 5 MG PO TABS: 5.0000 mg | ORAL_TABLET | Freq: Every day | ORAL | Status: DC

## 2022-08-13 MED ORDER — GLUCERNA SHAKE PO LIQD: 237.0000 mL | Freq: Three times a day (TID) | ORAL | 0 refills | Status: DC

## 2022-08-13 MED ORDER — MELATONIN 10 MG PO TABS: 1.0000 | ORAL_TABLET | Freq: Every day | ORAL | Status: DC

## 2022-08-13 MED ORDER — TRAZODONE HCL 50 MG PO TABS: 50.0000 mg | ORAL_TABLET | Freq: Every day | ORAL | Status: DC

## 2022-08-13 NOTE — TOC Progression Note (Addendum)
Transition of Care [facility]) - Progression Note    Patient Details  Name: Marie Hunt MRN: [id number] Date of Birth: [birth year]  Transition of Care [facility]) CM/SW Contact  Karn Cassis, [place] Phone Number: 08/13/2022, 8:35 AM  Clinical Narrative: Pt's granddaughter accepts bed at [facility]. Facility notified. Authorization pending.       Expected Discharge Plan:  (to be determined) Barriers to Discharge: Continued Medical Work up  Expected Discharge Plan and Services In-house Referral: Clinical Social Work     Living arrangements for the past 2 months: Single Family Home                                       Social Determinants of Health (SDOH) Interventions SDOH Screenings   Food Insecurity: No Food Insecurity (08/12/2022)  Housing: Patient Declined (08/12/2022)  Transportation Needs: No Transportation Needs (08/12/2022)  Utilities: Not At Risk (08/12/2022)  Tobacco Use: Medium Risk (08/11/2022)    Readmission Risk Interventions     No data to display

## 2022-08-13 NOTE — Care Management Obs Status (Signed)
MEDICARE OBSERVATION STATUS NOTIFICATION   Patient Details  Name: Marie Hunt MRN: [id number] Date of Birth: [birth year]   Medicare Observation Status Notification Given:  Yes    Corey Harold 08/13/2022, 11:16 AM

## 2022-08-13 NOTE — Discharge Summary (Signed)
Physician Discharge Summary  Marie Hunt LOV:[id number] DOB: March 27, 1932 DOA: 08/11/2022  PCP: Kirstie Peri, MD  Admit date: 08/11/2022 Discharge date: 08/13/2022  Disposition:  SNF   Recommendations for Outpatient Follow-up:  Follow up with PCP in 1-2 weeks Please follow up with cardiology in 3-4 weeks Please monitor blood sugar at least 2-3 times per day  Home Health:  SNF  Discharge Condition: STABLE   CODE STATUS: FULL  DIET: Dysphagia 3    Brief Hospitalization Summary: Please see all hospital notes, images, labs for full details of the hospitalization. ADMISSION PROVIDER HPI:  89 y.o. female with medical history significant of hypertension, GERD, T2DM, hypothyroidism, hyperlipidemia, CAD who presents to the emergency department via EMS from home due to syncope.  Patient was unable to provide history, history was obtained from ED physician and ED medical record.  Per report, patient was reported to suddenly became unresponsive today around 2:30 PM while sitting, but she quickly recovered without any confusion after recovery.  EMS was activated, but prior to arrival of EMS team, patient slumped over and became unresponsive again.  She woke up when EMS team arrived, she was agitated and confused.  Patient was currently taking Keflex due to recent UTI diagnosis.  No fever, chest pain, shortness of breath was reported.   ED Course:  In the emergency department, BP was 146/47, other vital signs were within normal range.  Workup in the ED showed normocytic anemia, albumin 2.7, BMP was normal except for blood glucose of 175 and BUN/creatinine of 61/2.91 (baseline of 2.4-2.7) troponin 48 > 47 > 47, BNP 246, urinalysis was unimpressive for UTI. CT of head showed no acute intracranial abnormality Chest x-ray showed no active disease Left lower extremity showed no evidence of DVT She was treated with IV ceftriaxone, IV hydration was provided. Hospitalist was asked to admit patient for  further evaluation and management.  Hospital Course by problem list  Syncope- on telemetry monitored unit no significant arrhythmias,  -Ruled out for ACS by serial troponins and EKG - echocardiogram with mild to moderate aortic stenosis, largely unchanged from prior , no other outflow obstruction, and EF is 50 to 55% (up from 30 to 35% back in May 2024) with mild concentric LVH and wall motion abnormalities noted (not new compared to echo from May 2024) -carotid artery Dopplers - ongoing outpatient surveillance by cardiology team recommended   HFrmEF--EF is 50 to 55% (up from 30 to 35% back in May 2024)  -Continue lasix and  Toprol-XL, aspirin and centralizing/isosorbide combo in lieu of ACEI/ARB due to renal dysfunction   Social/Ethics-- Discussed with grand daughter -[name]    Generalized weakness/ambulatory dysfunction--- physical therapist recommends SNF rehab, patient and family agreeable.  Phy therapy evaluations performed. SNF Rehab recommended. SNF appropriate as   is felt to need rehab services to restore this patient to their prior level of function to achieve safe transition back to home care. This patient needs rehab services for at least 5 days per week and skilled nursing services daily to facilitate this transition. Rehab is being requested as the most appropriate d/c option for this patient and is NOT felt to be for custodial care as evidenced by previously independent with ADLs including not limited to bathing, toileting, feeding and grooming prior to admission    Dementia with behavioral disturbance--- supportive care -Trazodone nightly   Hypothyroidism--- continue levothyroxine   DM2--recent A1c 7.7 reflecting uncontrolled DM with hyperglycemia PTA -Tradjenta 5 mg daily  -please check  BS at least 2-3 times per day CBG (last 3)  Recent Labs    08/12/22 2129 08/13/22 0750 08/13/22 1123  GLUCAP 157* 88 172*    GERD--continue Pepcid     Discharge  Diagnoses:  Principal Problem:   Syncope and collapse Active Problems:   Essential hypertension   Hyperlipidemia with target low density lipoprotein (LDL) cholesterol less than 100 mg/dL   GERD without esophagitis   Chronic combined systolic and diastolic CHF (congestive heart failure) (HCC)   Hypoalbuminemia due to protein-calorie malnutrition (HCC)   Acquired hypothyroidism   Type 2 diabetes mellitus with hyperglycemia [facility])   Discharge Instructions:  Allergies as of 08/13/2022       Reactions   Codeine Nausea And Vomiting        Medication List     STOP taking these medications    glimepiride 2 MG tablet Commonly known as: AMARYL       TAKE these medications    acetaminophen 325 MG tablet Commonly known as: TYLENOL Take 2 tablets (650 mg total) by mouth every 6 (six) hours as needed for mild pain, headache or fever (or Fever >/= 101).   allopurinol 100 MG tablet Commonly known as: ZYLOPRIM Take 100 mg by mouth every morning.   aspirin EC 81 MG tablet Take 81 mg by mouth every evening.   diclofenac Sodium 1 % Gel Commonly known as: VOLTAREN Apply 2 g topically daily as needed (for pain).   diphenhydrAMINE 2 % cream Commonly known as: BENADRYL Apply 1 application  topically 3 (three) times daily as needed for itching (rash).   Dry Eye Relief Drops 0.2-0.2-1 % Soln Generic drug: Glycerin-Hypromellose-PEG 400 Apply 1 drop to eye daily as needed (dry eyes).   famotidine 20 MG tablet Commonly known as: PEPCID Take 20 mg by mouth at bedtime.   feeding supplement (GLUCERNA SHAKE) Liqd Take 237 mLs by mouth 3 (three) times daily between meals.   folic acid 400 MCG tablet Commonly known as: FOLVITE Take 400 mcg by mouth every morning.   furosemide 40 MG tablet Commonly known as: LASIX Take 1 tablet (40 mg total) by mouth daily.   Global Inject Ease Lancets 30G Misc See admin instructions.   hydrALAZINE 25 MG tablet Commonly known as:  APRESOLINE Take 1 tablet (25 mg total) by mouth every 8 (eight) hours.   isosorbide mononitrate 30 MG 24 hr tablet Commonly known as: IMDUR Take 0.5 tablets (15 mg total) by mouth daily.   levothyroxine 75 MCG tablet Commonly known as: SYNTHROID Take 0.5 tablets (37.5 mcg total) by mouth daily at 6 (six) AM.   linagliptin 5 MG Tabs tablet Commonly known as: Tradjenta Take 1 tablet (5 mg total) by mouth daily.   Melatonin 10 MG Tabs Take 1 tablet by mouth at bedtime. What changed: when to take this   metoprolol succinate 25 MG 24 hr tablet Commonly known as: TOPROL-XL Take 0.5 tablets (12.5 mg total) by mouth daily.   multivitamin with minerals Tabs tablet Take 1 tablet by mouth daily. Start taking on: August 14, 2022   OneTouch Verio test strip Generic drug: glucose blood 1 each daily.   simvastatin 20 MG tablet Commonly known as: ZOCOR Take 20 mg by mouth at bedtime.   traZODone 50 MG tablet Commonly known as: DESYREL Take 1 tablet (50 mg total) by mouth at bedtime. What changed: how much to take        Contact information for after-discharge care  Destination     [facility] Preferred SNF .   Service: Skilled Nursing Contact information: [address] [place] [address] [phone]                    Allergies  Allergen Reactions   Codeine Nausea And Vomiting   Allergies as of 08/13/2022       Reactions   Codeine Nausea And Vomiting        Medication List     STOP taking these medications    glimepiride 2 MG tablet Commonly known as: AMARYL       TAKE these medications    acetaminophen 325 MG tablet Commonly known as: TYLENOL Take 2 tablets (650 mg total) by mouth every 6 (six) hours as needed for mild pain, headache or fever (or Fever >/= 101).   allopurinol 100 MG tablet Commonly known as: ZYLOPRIM Take 100 mg by mouth every morning.   aspirin EC 81 MG tablet Take 81 mg by mouth  every evening.   diclofenac Sodium 1 % Gel Commonly known as: VOLTAREN Apply 2 g topically daily as needed (for pain).   diphenhydrAMINE 2 % cream Commonly known as: BENADRYL Apply 1 application  topically 3 (three) times daily as needed for itching (rash).   Dry Eye Relief Drops 0.2-0.2-1 % Soln Generic drug: Glycerin-Hypromellose-PEG 400 Apply 1 drop to eye daily as needed (dry eyes).   famotidine 20 MG tablet Commonly known as: PEPCID Take 20 mg by mouth at bedtime.   feeding supplement (GLUCERNA SHAKE) Liqd Take 237 mLs by mouth 3 (three) times daily between meals.   folic acid 400 MCG tablet Commonly known as: FOLVITE Take 400 mcg by mouth every morning.   furosemide 40 MG tablet Commonly known as: LASIX Take 1 tablet (40 mg total) by mouth daily.   Global Inject Ease Lancets 30G Misc See admin instructions.   hydrALAZINE 25 MG tablet Commonly known as: APRESOLINE Take 1 tablet (25 mg total) by mouth every 8 (eight) hours.   isosorbide mononitrate 30 MG 24 hr tablet Commonly known as: IMDUR Take 0.5 tablets (15 mg total) by mouth daily.   levothyroxine 75 MCG tablet Commonly known as: SYNTHROID Take 0.5 tablets (37.5 mcg total) by mouth daily at 6 (six) AM.   linagliptin 5 MG Tabs tablet Commonly known as: Tradjenta Take 1 tablet (5 mg total) by mouth daily.   Melatonin 10 MG Tabs Take 1 tablet by mouth at bedtime. What changed: when to take this   metoprolol succinate 25 MG 24 hr tablet Commonly known as: TOPROL-XL Take 0.5 tablets (12.5 mg total) by mouth daily.   multivitamin with minerals Tabs tablet Take 1 tablet by mouth daily. Start taking on: August 14, 2022   OneTouch Verio test strip Generic drug: glucose blood 1 each daily.   simvastatin 20 MG tablet Commonly known as: ZOCOR Take 20 mg by mouth at bedtime.   traZODone 50 MG tablet Commonly known as: DESYREL Take 1 tablet (50 mg total) by mouth at bedtime. What changed: how much to  take        Procedures/Studies: US Carotid Bilateral  Result Date: 08/12/2022 CLINICAL DATA:  Syncope, hypertension, diabetes, history of tobacco abuse. Post left carotid endarterectomy. EXAM: BILATERAL CAROTID DUPLEX ULTRASOUND TECHNIQUE: Wallace Cullens scale imaging, color Doppler and duplex ultrasound were performed of bilateral carotid and vertebral arteries in the neck. COMPARISON:  None Available. FINDINGS: Criteria: Quantification of carotid stenosis is based on  velocity parameters that correlate the residual internal carotid diameter with NASCET-based stenosis levels, using the diameter of the distal internal carotid lumen as the denominator for stenosis measurement. The following velocity measurements were obtained: RIGHT ICA: 136/9 cm/sec CCA: 146/7 cm/sec SYSTOLIC ICA/CCA RATIO:  0.9 ECA: 205 cm/sec LEFT ICA: 66/8 cm/sec CCA: 77/8 cm/sec SYSTOLIC ICA/CCA RATIO:  0.8 ECA: 200 cm/sec RIGHT CAROTID ARTERY: Mild tortuosity. Calcified plaque in the bulb and proximal internal external carotid arteries resulting at least moderate stenosis, with high resistance waveform in the common carotid. Some distal acoustic shadowing in the proximal ICA. Delayed systolic upstroke in the ICA. RIGHT VERTEBRAL ARTERY:  Normal flow direction and waveform. LEFT CAROTID ARTERY: Scattered calcified plaque in the common carotid artery and bulb which are capacious. No high-grade stenosis. Normal color Doppler signal and waveforms throughout. LEFT VERTEBRAL ARTERY:  Normal flow direction and waveform. IMPRESSION: 1. Although no high-grade right carotid stenosis is visualized, abnormal waveforms in the right common and internal carotid arteries suggest a significant lesion. MRA may be useful for further evaluation. 2. No evident recurrent stenosis post left carotid endarterectomy 3. Antegrade bilateral vertebral arterial flow. Electronically Signed   By: Corlis Leak M.D.   On: 08/12/2022 20:03   ECHOCARDIOGRAM LIMITED  Result Date:  08/12/2022    ECHOCARDIOGRAM LIMITED REPORT   Patient Name:   Marie Hunt Date of Exam: 08/12/2022 Medical Rec #:  [id number]          Height:       64.0 in Accession #:    [id number]         Weight:       125.0 lb Date of Birth:  1932/09/09          BSA:          1.602 m Patient Age:    89 years           BP:           137/42 mmHg Patient Gender: F                  HR:           63 bpm. [facility] Procedure: Limited Echo, Cardiac Doppler and Limited Color Doppler Indications:    Syncope R55, evaluate AS, LVEF and WMA  History:        Patient has prior history of Echocardiogram examinations, most                 recent 07/03/2022. CHF, Aortic Valve Disease; Risk                 Factors:Hypertension, Diabetes and Dyslipidemia.  Sonographer:    Celesta Gentile RCS Referring Phys: [id number] OLADAPO ADEFESO IMPRESSIONS  1. Left ventricular ejection fraction, by estimation, is 50 to 55%. The left ventricle has low normal function. The left ventricle demonstrates regional wall motion abnormalities (see scoring diagram/findings for description). There is mild concentric left ventricular hypertrophy.  2. Right ventricular systolic function is normal. The right ventricular size is normal. Tricuspid regurgitation signal is inadequate for assessing PA pressure.  3. The mitral valve is degenerative. Mild mitral valve regurgitation.  4. The aortic valve is tricuspid. There is moderate calcification of the aortic valve. Aortic valve regurgitation is mild. Mild to moderate aortic valve stenosis. Aortic valve mean gradient measures 15.4 mmHg. Dimentionless index 0.43.  5. The inferior vena cava is normal in size with greater than 50% respiratory variability, suggesting right  atrial pressure of 3 mmHg. Comparison(s): Prior images reviewed side by side. LVEF has improved relative to prior study, 50-55% range. Stable mild to moderate aortic stenosis. FINDINGS  Left Ventricle: Left ventricular ejection fraction, by  estimation, is 50 to 55%. The left ventricle has low normal function. The left ventricle demonstrates regional wall motion abnormalities. The left ventricular internal cavity size was normal in size. There is mild concentric left ventricular hypertrophy.  LV Wall Scoring: The basal inferior segment is hypokinetic. The entire anterior wall, entire lateral wall, entire septum, entire apex, and mid and distal inferior wall are normal. Right Ventricle: The right ventricular size is normal. No increase in right ventricular wall thickness. Right ventricular systolic function is normal. Tricuspid regurgitation signal is inadequate for assessing PA pressure. Left Atrium: Left atrial size was normal in size. Right Atrium: Right atrial size was normal in size. Mitral Valve: The mitral valve is degenerative in appearance. Mild to moderate mitral annular calcification. Mild mitral valve regurgitation. Tricuspid Valve: The tricuspid valve is grossly normal. Tricuspid valve regurgitation is trivial. Aortic Valve: The aortic valve is tricuspid. There is moderate calcification of the aortic valve. There is mild aortic valve annular calcification. Aortic valve regurgitation is mild. Aortic regurgitation PHT measures 416 msec. Mild to moderate aortic stenosis is present. Aortic valve mean gradient measures 15.4 mmHg. Aortic valve peak gradient measures 24.4 mmHg. Aortic valve area, by VTI measures 1.35 cm. Pulmonic Valve: The pulmonic valve was grossly normal. Pulmonic valve regurgitation is trivial. Aorta: The aortic root is normal in size and structure. Venous: The inferior vena cava is normal in size with greater than 50% respiratory variability, suggesting right atrial pressure of 3 mmHg. IAS/Shunts: No atrial level shunt detected by color flow Doppler. LEFT VENTRICLE PLAX 2D LVIDd:         4.70 cm LVIDs:         3.80 cm LV PW:         1.30 cm LV IVS:        1.00 cm LVOT diam:     2.00 cm LV SV:         83 LV SV Index:   52 LVOT  Area:     3.14 cm  LEFT ATRIUM         Index LA diam:    4.20 cm 2.62 cm/m  AORTIC VALVE AV Area (Vmax):    1.22 cm AV Area (Vmean):   1.13 cm AV Area (VTI):     1.35 cm AV Vmax:           247.00 cm/s AV Vmean:          186.600 cm/s AV VTI:            0.618 m AV Peak Grad:      24.4 mmHg AV Mean Grad:      15.4 mmHg LVOT Vmax:         96.00 cm/s LVOT Vmean:        67.400 cm/s LVOT VTI:          0.265 m LVOT/AV VTI ratio: 0.43 AI PHT:            416 msec  AORTA Ao Root diam: 2.90 cm  SHUNTS Systemic VTI:  0.26 m Systemic Diam: 2.00 cm Nona Dell MD Electronically signed by Nona Dell MD Signature Date/Time: 08/12/2022/10:30:52 AM    Final    CT HEAD WO CONTRAST  Result Date: 08/11/2022 CLINICAL DATA:  Syncope EXAM: CT  HEAD WITHOUT CONTRAST TECHNIQUE: Contiguous axial images were obtained from the base of the skull through the vertex without intravenous contrast. RADIATION DOSE REDUCTION: This exam was performed according to the departmental dose-optimization program which includes automated exposure control, adjustment of the mA and/or kV according to patient size and/or use of iterative reconstruction technique. COMPARISON:  None Available. FINDINGS: Brain: No acute territorial infarction, hemorrhage or intracranial mass. Moderate atrophy. Moderate white matter hypodensity consistent with chronic small vessel ischemic change. Nonenlarged ventricles Vascular: No hyperdense vessels. Dense carotid vascular calcification. Skull: Normal. Negative for fracture or focal lesion. Sinuses/Orbits: No acute finding. Other: None IMPRESSION: 1. No CT evidence for acute intracranial abnormality. 2. Atrophy and chronic small vessel ischemic changes of the white matter. Electronically Signed   By: Jasmine Pang M.D.   On: 08/11/2022 18:10   US Venous Img Lower Unilateral Left  Result Date: 08/11/2022 CLINICAL DATA:  Left lower extremity edema. Former smoker. Evaluate for DVT. EXAM: LEFT LOWER EXTREMITY VENOUS  DOPPLER ULTRASOUND TECHNIQUE: Gray-scale sonography with graded compression, as well as color Doppler and duplex ultrasound were performed to evaluate the lower extremity deep venous systems from the level of the common femoral vein and including the common femoral, femoral, profunda femoral, popliteal and calf veins including the posterior tibial, peroneal and gastrocnemius veins when visible. The superficial great saphenous vein was also interrogated. Spectral Doppler was utilized to evaluate flow at rest and with distal augmentation maneuvers in the common femoral, femoral and popliteal veins. COMPARISON:  None Available. FINDINGS: Contralateral Common Femoral Vein: Respiratory phasicity is normal and symmetric with the symptomatic side. No evidence of thrombus. Normal compressibility. Common Femoral Vein: No evidence of thrombus. Normal compressibility, respiratory phasicity and response to augmentation. Saphenofemoral Junction: No evidence of thrombus. Normal compressibility and flow on color Doppler imaging. Profunda Femoral Vein: No evidence of thrombus. Normal compressibility and flow on color Doppler imaging. Femoral Vein: No evidence of thrombus. Normal compressibility, respiratory phasicity and response to augmentation. Popliteal Vein: No evidence of thrombus. Normal compressibility, respiratory phasicity and response to augmentation. Calf Veins: No evidence of thrombus. Normal compressibility and flow on color Doppler imaging. Superficial Great Saphenous Vein: No evidence of thrombus. Normal compressibility. Other Findings:  None. IMPRESSION: No evidence of DVT within the left lower extremity. Electronically Signed   By: Simonne Come M.D.   On: 08/11/2022 16:22   DG Chest Portable 1 View  Result Date: 08/11/2022 CLINICAL DATA:  Syncope EXAM: PORTABLE CHEST 1 VIEW COMPARISON:  Chest x-ray 07/03/2022 FINDINGS: Cardiomediastinal silhouette is stable, the heart is mildly enlarged. There is no focal lung  infiltrate, pleural effusion or pneumothorax. There are scattered chronic appearing interstitial opacities bilaterally. No acute fractures are seen. IMPRESSION: 1. No active disease. 2. Chronic appearing interstitial opacities bilaterally. Electronically Signed   By: Darliss Cheney M.D.   On: 08/11/2022 15:35     Subjective: Pt reports that overall feeling better, no specific complaints.   Discharge Exam: Vitals:   08/13/22 0100 08/13/22 0407  BP: (!) 100/44 113/85  Pulse: 78 89  Resp: 20 16  Temp: 98.8 F (37.1 C) 99.3 F (37.4 C)  SpO2:  100%   Vitals:   08/12/22 1124 08/12/22 2126 08/13/22 0100 08/13/22 0407  BP: (!) 134/104 (!) 150/68 (!) 100/44 113/85  Pulse: 71 80 78 89  Resp: 18 20 20 16   Temp: 97.8 F (36.6 C) (!) 101.1 F (38.4 C) 98.8 F (37.1 C) 99.3 F (37.4 C)  TempSrc:  Oral  Oral  SpO2: 99% 100%  100%    General: Pt is alert, awake, not in acute distress Cardiovascular: normal S1/S2 +, no rubs, no gallops Respiratory: CTA bilaterally, no wheezing, no rhonchi Abdominal: Soft, NT, ND, bowel sounds + Extremities: no edema, no cyanosis   The results of significant diagnostics from this hospitalization (including imaging, microbiology, ancillary and laboratory) are listed below for reference.     Microbiology: Recent Results (from the past 240 hour(s))  Urine Culture     Status: Abnormal (Preliminary result)   Collection Time: 08/11/22  9:45 PM   Specimen: Urine, Catheterized  Result Value Ref Range Status   Specimen Description   Final    URINE, CATHETERIZED Performed at [facility], [address], [place], [place] [address]    Special Requests   Final    NONE Performed at [facility], [address], [place], [place] [address]    Culture (A)  Final    80,000 COLONIES/mL ENTEROCOCCUS FAECALIS SUSCEPTIBILITIES TO FOLLOW Performed at [facility], [address], [place], [place] [address]    Report Status PENDING  Incomplete     Labs: BNP  (last 3 results) Recent Labs    07/02/22 1555 08/11/22 1440  BNP 1,307.0* 246.0*   Basic Metabolic Panel: Recent Labs  Lab 08/11/22 1440 08/12/22 0455  NA 136 137  K 4.9 4.4  CL 104 104  CO2 22 23  GLUCOSE 175* 114*  BUN 61* 54*  CREATININE 2.91* 2.57*  CALCIUM 9.4 8.9  MG 2.4 2.1  PHOS  --  3.7   Liver Function Tests: Recent Labs  Lab 08/11/22 1440 08/12/22 0455  AST 22 17  ALT 16 13  ALKPHOS 98 81  BILITOT 0.6 0.4  PROT 6.6 6.1*  ALBUMIN 2.7* 2.4*   No results for input(s): "LIPASE", "AMYLASE" in the last 168 hours. No results for input(s): "AMMONIA" in the last 168 hours. CBC: Recent Labs  Lab 08/11/22 1440 08/12/22 0455  WBC 8.0 5.5  HGB 8.7* 7.9*  HCT 29.0* 25.6*  MCV 86.8 84.2  PLT 204 183   Cardiac Enzymes: No results for input(s): "CKTOTAL", "CKMB", "CKMBINDEX", "TROPONINI" in the last 168 hours. BNP: Invalid input(s): "POCBNP" CBG: Recent Labs  Lab 08/12/22 1122 08/12/22 1612 08/12/22 2129 08/13/22 0750 08/13/22 1123  GLUCAP 91 92 157* 88 172*   D-Dimer No results for input(s): "DDIMER" in the last 72 hours. Hgb A1c No results for input(s): "HGBA1C" in the last 72 hours. Lipid Profile No results for input(s): "CHOL", "HDL", "LDLCALC", "TRIG", "CHOLHDL", "LDLDIRECT" in the last 72 hours. Thyroid function studies Recent Labs    08/11/22 1440  TSH 25.363*   Anemia work up No results for input(s): "VITAMINB12", "FOLATE", "FERRITIN", "TIBC", "IRON", "RETICCTPCT" in the last 72 hours. Urinalysis    Component Value Date/Time   COLORURINE STRAW (A) 08/11/2022 2007   APPEARANCEUR CLEAR 08/11/2022 2007   APPEARANCEUR Cloudy (A) 01/01/2022 1035   LABSPEC 1.008 08/11/2022 2007   PHURINE 5.0 08/11/2022 2007   GLUCOSEU NEGATIVE 08/11/2022 2007   HGBUR NEGATIVE 08/11/2022 2007   BILIRUBINUR NEGATIVE 08/11/2022 2007   BILIRUBINUR Negative 01/01/2022 1035   KETONESUR NEGATIVE 08/11/2022 2007   PROTEINUR NEGATIVE 08/11/2022 2007    UROBILINOGEN negative (A) 07/13/2019 1536   UROBILINOGEN 0.2 10/06/2014 1505   NITRITE NEGATIVE 08/11/2022 2007   LEUKOCYTESUR SMALL (A) 08/11/2022 2007   Sepsis Labs Recent Labs  Lab 08/11/22 1440 08/12/22 0455  WBC 8.0 5.5   Microbiology Recent  Results (from the past 240 hour(s))  Urine Culture     Status: Abnormal (Preliminary result)   Collection Time: 08/11/22  9:45 PM   Specimen: Urine, Catheterized  Result Value Ref Range Status   Specimen Description   Final    URINE, CATHETERIZED Performed at [facility], [address], [place], [place] [address]    Special Requests   Final    NONE Performed at [facility], [address] [place], [place] [address]    Culture (A)  Final    80,000 COLONIES/mL ENTEROCOCCUS FAECALIS SUSCEPTIBILITIES TO FOLLOW Performed at [facility], [address], [place], [place] [address]    Report Status PENDING  Incomplete   Time coordinating discharge:  41 mins   SIGNED:  Standley Dakins, MD  [facility] 08/13/2022, 3:45 PM How to contact the [facility] Attending or Consulting provider 7A - 7P or covering provider during after hours 7P -7A, for this patient?  Check the care team in [facility] and look for a) attending/consulting [facility] provider listed and b) the [facility] team listed Log into [email or web address] and use [place]'s universal password to access. If you do not have the password, please contact the hospital operator. Locate the [facility] provider you are looking for under [facility] and page to a number that you can be directly reached. If you still have difficulty reaching the provider, please page the [facility] (Director on Call) for the Hospitalists listed on amion for assistance.

## 2022-08-13 NOTE — Discharge Instructions (Signed)

## 2022-08-13 NOTE — Progress Notes (Signed)
Mobility Specialist Progress Note:    08/13/22 1019  Mobility  Activity Ambulated with assistance in hallway;Transferred from bed to chair  Level of Assistance Contact guard assist, steadying assist  Assistive Device Front wheel walker  Distance Ambulated (ft) 30 ft  Range of Motion/Exercises Active;All extremities  Activity Response Tolerated well  Mobility Referral Yes  $Mobility charge 1 Mobility  Mobility Specialist Start Time (ACUTE ONLY) 1005  Mobility Specialist Stop Time (ACUTE ONLY) 1019  Mobility Specialist Time Calculation (min) (ACUTE ONLY) 14 min   Pt agreeable to mobility session, received in bed. Ambulated in hallway, RW and CGA for safety. Pt had slow labored cadence, c/o foot pain. Returned pt to room, sitting up in chair, all needs met.   Feliciana Rossetti Mobility Specialist Please contact via Special educational needs teacher or  Rehab office at [phone]

## 2022-08-13 NOTE — Progress Notes (Signed)
Patient has rested well this shift.  Patient vitals have been stable.  CCMD did call and state that patient had run of SVT and rate was up to 160, but non sustained.  Af that time vitals were checked, patient was resting comfortably with no c/o chest pain, and vitals were stable.  Heart rate was elevated momentarily.  Notified MD, no new orders at that time.  Patient ambulated fairly well with assistance with walker to restroom.  Patient has been able to follow commands this shift, but still pleasantly confused.

## 2022-08-13 NOTE — TOC Transition Note (Signed)
Transition of Care [facility]) - CM/SW Discharge Note   Patient Details  Name: Marie Hunt MRN: [id number] Date of Birth: 08-28-1932  Transition of Care [facility] CM/SW Contact:  Karn Cassis, LCSW Phone Number: 08/13/2022, 3:38 PM   Clinical Narrative: Pt d/c today to [facility]. Pt's granddaughter and SNF aware and agreeable. SNF authorization received.    Will send d/c summary when completed. Pt will transfer with staff. LCSW to provide number for RN to call report.    Final next level of care: Skilled Nursing Facility Barriers to Discharge: Barriers Resolved   Patient Goals and CMS Choice   Choice offered to / list presented to : [facility] POA / Guardian  Discharge Placement                Patient chooses bed at: [facility] Patient to be transferred to facility by: staff Name of family member notified: [name]- granddaughter Patient and family notified of of transfer: 08/13/22  Discharge Plan and Services Additional resources added to the After Visit Summary for   In-house Referral: Clinical Social Work                                   Social Determinants of Health (SDOH) Interventions SDOH Screenings   Food Insecurity: No Food Insecurity (08/12/2022)  Housing: Patient Declined (08/12/2022)  Transportation Needs: No Transportation Needs (08/12/2022)  Utilities: Not At Risk (08/12/2022)  Tobacco Use: Medium Risk (08/11/2022)     Readmission Risk Interventions     No data to display

## 2022-08-13 NOTE — Progress Notes (Signed)
Initial Nutrition Assessment  DOCUMENTATION CODES:   Not applicable  INTERVENTION:  Will downgrade diet to dysphagia 3 (mechanical soft) as granddaughter reports pt has difficulty chewing with only top dentures.  Continue Glucerna Shake po TID, each supplement provides 220 kcal and 10 grams of protein. Pt prefers strawberry flavor.  Provide multivitamin with minerals po daily.  Pending oral intake, pt may benefit from strawberry Ensure Enlive supplement po BID as these supplements are higher in calories and protein. Each bottle provides 350 kcal and 20 grams of protein.  Recommend measuring weight and height.  NUTRITION DIAGNOSIS:   Inadequate oral intake related to decreased appetite as evidenced by meal completion < 50%, per patient/family report.  GOAL:   Patient will meet greater than or equal to 90% of their needs  MONITOR:   PO intake, Supplement acceptance, Labs, Weight trends, Skin, I & O's  REASON FOR ASSESSMENT:   Malnutrition Screening Tool    ASSESSMENT:   89 year old female with PMHx of dementia, HTN, GERD, DM type 2, hypothyroidism, HLD, CAD, HF admitted with syncope, generalized weakness.  RD working remotely. Spoke with patient's granddaughter Jeri Modena over the phone at [phone] to obtain nutrition history. She reports patient lived alone PTA but is cared for by family around the clock. On the weekends patient stays with her granddaughter. She reports patient is served 3 meals per day and she may eat well at 2 meals, especially breakfast where she tends to eat the best. For breakfast she has eggs (scrambled, fried, or boiled) with toast and fruit and a Glucerna. For lunch she has chicken with sweet potato and salad. For dinner she has leftovers or another home cooked meal such as spaghetti with vegetables and [place] toast. Pt typically drinks 1-2 bottles of Glucerna daily and prefers strawberry flavor. Pt does not have any food allergies or  intolerances. Family does provide low sodium foods at home and avoids foods really high in phosphorus. Denies nausea or emesis. Reports that patient occasionally reports abdominal pain but family is unsure if she truly has abdominal pain or if she is confused at times. Pt only has upper dentures so does have difficulty chewing tougher foods. Plan is to downgrade diet to dysphagia 3 (mechanical soft) per discussion with granddaughter. She reports patient's favorite food is sweet potato so added this under "likes" on [employer]. Pt is already ordered for Glucerna po TID. Will update to show that preference is strawberry flavor. Discussed that pending oral intake pt may benefit from Ensure supplement which is higher in calories and protein. Pt currently documented to be eating 0-25% but has been on regular texture diet, which  may be difficult to chew.  Granddaughter denies any recent weight loss. She reports patient's UBW is around 125 lbs (56.8 kg) but may fluctuate 5-10 lbs. Most recent weight 56.7 kg on 07/17/22. It appears weight was around 68 kg in 2021. No weight or height has been obtained yet this admission.  Medications reviewed and include: famotidine, Glucerna TID, Lasix 40 mg daily, Novolog 0-9 units TID, Imdur, levothyroxine  Labs reviewed: CBG 88-172. On 6/18 BUN 54, Creatinine 2.57  UOP: 3 occurrences unmeasured UOP in previous 24 hours  I/O: +610 mL since admission  Noted per chart possible plan for acute rehab after discharge.  NUTRITION - FOCUSED PHYSICAL EXAM:  Unable to complete as RD is working remotely  Diet Order:   Diet Order  Diet heart healthy/carb modified Room service appropriate? Yes; Fluid consistency: Thin  Diet effective now                  EDUCATION NEEDS:   No education needs have been identified at this time  Skin:  Skin Assessment: Skin Integrity Issues: Skin Integrity Issues:: Stage II Stage II: left buttocks  Last BM:   Unknown/PTA  Height:   Ht Readings from Last 1 Encounters:  07/17/22 5\' 4"  (1.626 m)   Weight:   Wt Readings from Last 1 Encounters:  07/17/22 56.7 kg   BMI:  There is no height or weight on file to calculate BMI.  Estimated Nutritional Needs:   Kcal:  1500-1700  Protein:  75-85 grams  Fluid:  1.5-1.7 L/day  Letta Median, MS, RD, LDN, CNSC Pager number available on Amion

## 2022-08-14 LAB — URINE CULTURE: Culture: 80000 — AB

## 2022-08-14 NOTE — Progress Notes (Signed)
Telephone call made to [facility] with my recommendation to give 1 dose of oral fosfomycin 3 gm x 1 to treat UTI.   08/14/22.  Maryln Manuel, MD

## 2022-08-15 ENCOUNTER — Encounter: Payer: Self-pay | Admitting: Internal Medicine

## 2022-08-15 ENCOUNTER — Non-Acute Institutional Stay (SKILLED_NURSING_FACILITY): Payer: Medicare Other | Admitting: Internal Medicine

## 2022-08-15 DIAGNOSIS — F0150 Vascular dementia without behavioral disturbance: Secondary | ICD-10-CM

## 2022-08-15 DIAGNOSIS — I10 Essential (primary) hypertension: Secondary | ICD-10-CM

## 2022-08-15 DIAGNOSIS — N184 Chronic kidney disease, stage 4 (severe): Secondary | ICD-10-CM | POA: Diagnosis not present

## 2022-08-15 DIAGNOSIS — D631 Anemia in chronic kidney disease: Secondary | ICD-10-CM | POA: Diagnosis not present

## 2022-08-15 DIAGNOSIS — I129 Hypertensive chronic kidney disease with stage 1 through stage 4 chronic kidney disease, or unspecified chronic kidney disease: Secondary | ICD-10-CM

## 2022-08-15 DIAGNOSIS — E039 Hypothyroidism, unspecified: Secondary | ICD-10-CM

## 2022-08-15 DIAGNOSIS — R55 Syncope and collapse: Secondary | ICD-10-CM

## 2022-08-15 DIAGNOSIS — E1322 Other specified diabetes mellitus with diabetic chronic kidney disease: Secondary | ICD-10-CM

## 2022-08-15 NOTE — Assessment & Plan Note (Addendum)
Monitor for hypoglycemia & seizures @ SNF. PT/OT at SNF as tolerated.

## 2022-08-15 NOTE — Assessment & Plan Note (Signed)
6/17 - 08/13/2022 hospitalized with syncope.  Creatinine 2.91 with GFR of 18 indicating CKD stage IV.  Med list reviewed; no change indicated. Current A1c 07/03/2022 revealed a value of 7.7% indicating adequate control in this [age]ogenarian with multiple advanced comorbidities.

## 2022-08-15 NOTE — Assessment & Plan Note (Signed)
BP soft; wean antihypertensive medications if this persists

## 2022-08-15 NOTE — Assessment & Plan Note (Addendum)
6/17 - 08/13/2022 hospitalized with syncope.  H/H 8.7/29.0 at presentation; final value 7.9/25.6.   07/04/22 iron level 12.  B12 level 2753. No bleeding dyscrasias reported.  Iron supplement w/o GI evaluation because of age & severe dementia. Continue to monitor.

## 2022-08-15 NOTE — Assessment & Plan Note (Signed)
She was unable to name her physician.  She was unaware she had been hospitalized.  She repeatedly gave the year as 1934. Antiwandering bracelet ordered because of being high "escape risk."

## 2022-08-15 NOTE — Progress Notes (Signed)
NURSING HOME LOCATION:  [facility] ROOM NUMBER:  143 P  CODE STATUS: Full code  PCP:   Kirstie Peri MD  This is a comprehensive admission note to this SNFperformed on this date less than 30 days from date of admission. Included are preadmission medical/surgical history; reconciled medication list; family history; social history and comprehensive review of systems.  Corrections and additions to the records were documented. Comprehensive physical exam was also performed. Additionally a clinical summary was entered for each active diagnosis pertinent to this admission in the Problem List to enhance continuity of care.  HPI: She was hospitalized 6/17 - 08/13/2022 presenting to the ED with syncope.  The patient apparently became unresponsive approximately 2:30 PM the day of admission while sitting.  Initially she recovered without any confusion, but prior to arrival of the EMS team she slumped over and became unresponsive again.  Upon awakening she was agitated and confused.  At the time the patient was on Keflex for recent diagnosis of UTI. Mild hypertension was present with blood pressure of 146/47. CT of the head revealed no acute intercranial abnormality.  It did reveal moderate cerebral atrophy with moderate white matter changes consistent with chronic small vessel ischemic disease. She received IV hydration and ceftriaxone IV empirically. Telemetry monitor revealed no significant dysrhythmias.  ACS was ruled out by the serial troponins and serial EKGs.  Echocardiogram revealed mild-moderate aortic stenosis essentially stable from prior findings.  EF was 50-55%, improved from prior values of 30-35% and May 2024.  Mild concentric LVH and wall motion abnormalities were noted but stable. Normocytic anemia was present.  Albumin was 2.7.  Troponin remained stable in the 47-48 range.  BNP was 246.  Creatinine was 2.91; baseline was felt to be 2.4-2.7.  Current A1c is 7.7% indicating  adequate DM control in this octogenarian. Glimiperide was D/Ced.  Normochromic/normocytic anemia was present with H/H 8.7/29.  Final values were 7.9/25.6.  On 6/17 TSH was 25.363; on 5/9 it was recorded as 14.326.  L-thyroxine  37.5 mg had been prescribed 5/15 . PT/OT recommended SNF placement for rehab and family was agreeable.  Past medical and surgical history: Includes history of anemia, history of carotid arterial disease , history of cholelithiasis, essential hypertension, GERD, dyslipidemia, history of diverticulosis, and diabetes with vascular complications. Significant procedures and surgeries include transurethral resection of bladder tumor and carotid endarterectomy.  Social history: non drinker, former smoker.  Family history: Noncontributory due to advanced age.   Review of systems: Severe dementia prevented completion of review of systems.  She was in the hall in the wheelchair looking out the exit door windows.  She could not tell me her room number.  She gave the year as [birth year].  She could not tell me her doctor's name.  She stated that she had been told that she needed to "eat lunch more."  She subsequently denied having been in the hospital.  She made the comment that "I came today on my way to work."  She did complain of pain in her legs but could not qualify it.  When asked if she had any thyroid problems she said "yes, aching."  Physical exam:  Pertinent or positive findings: She appears her age and suboptimally nourished.  She is wearing only the upper plate.  She has a grade 1.5 systolic murmur at the base.  There is accentuation of the lordotic curve.  Pedal pulses cannot be palpated.  She has nonpitting edema greater in the left  lower extremity than the right.  There is atrophy of the upper extremities and interosseous wasting as well.  General appearance: no acute distress, increased work of breathing is present.   Lymphatic: No lymphadenopathy about the head, neck,  axilla. Eyes: No conjunctival inflammation or lid edema is present. There is no scleral icterus. Ears:  External ear exam shows no significant lesions or deformities.   Nose:  External nasal examination shows no deformity or inflammation. Nasal mucosa are pink and moist without lesions, exudates Neck:  No thyromegaly, masses, tenderness noted.    Heart:  Normal rate and regular rhythm. S1 and S2 normal without gallop, click, rub.  Lungs: Chest clear to auscultation without wheezes, rhonchi, rales, rubs. Abdomen: Bowel sounds are normal.  Abdomen is soft and nontender with no organomegaly, hernias, masses. GU: Deferred  Extremities:  No cyanosis, clubbing Neurologic exam: Balance, Rhomberg, finger to nose testing could not be completed due to clinical state Skin: Warm & dry w/o tenting. No significant lesions or rash.  See clinical summary under each active problem in the Problem List with associated updated therapeutic plan

## 2022-08-15 NOTE — Patient Instructions (Signed)
See assessment and plan under each diagnosis in the problem list and acutely for this visit 

## 2022-08-15 NOTE — Assessment & Plan Note (Signed)
Despite titration of L-thyroxine the TSH has climbed dramatically.  Compliance will be verified here with repeat TSH.

## 2022-08-19 ENCOUNTER — Encounter: Payer: Self-pay | Admitting: Internal Medicine

## 2022-08-19 ENCOUNTER — Other Ambulatory Visit (HOSPITAL_COMMUNITY)
Admission: RE | Admit: 2022-08-19 | Discharge: 2022-08-19 | Disposition: A | Discharge status: Home / Self Care | Payer: 59 | Source: Skilled Nursing Facility | Attending: *Deleted | Admitting: *Deleted

## 2022-08-19 ENCOUNTER — Other Ambulatory Visit: Payer: Self-pay | Admitting: Internal Medicine

## 2022-08-19 DIAGNOSIS — Z8739 Personal history of other diseases of the musculoskeletal system and connective tissue: Secondary | ICD-10-CM | POA: Insufficient documentation

## 2022-08-19 LAB — CBC
HCT: 23.5 % — ABNORMAL LOW (ref 36.0–46.0)
Hemoglobin: 7.4 g/dL — ABNORMAL LOW (ref 12.0–15.0)
MCH: 26.3 pg (ref 26.0–34.0)
MCHC: 31.5 g/dL (ref 30.0–36.0)
MCV: 83.6 fL (ref 80.0–100.0)
Platelets: 185 10*3/uL (ref 150–400)
RBC: 2.81 MIL/uL — ABNORMAL LOW (ref 3.87–5.11)
RDW: 17 % — ABNORMAL HIGH (ref 11.5–15.5)
WBC: 5.8 10*3/uL (ref 4.0–10.5)
nRBC: 0 % (ref 0.0–0.2)

## 2022-08-19 LAB — URIC ACID: Uric Acid, Serum: 5.2 mg/dL (ref 2.5–7.1)

## 2022-08-19 LAB — BASIC METABOLIC PANEL
Anion gap: 11 (ref 5–15)
BUN: 70 mg/dL — ABNORMAL HIGH (ref 8–23)
CO2: 24 mmol/L (ref 22–32)
Calcium: 9.3 mg/dL (ref 8.9–10.3)
Chloride: 97 mmol/L — ABNORMAL LOW (ref 98–111)
Creatinine, Ser: 4 mg/dL — ABNORMAL HIGH (ref 0.44–1.00)
GFR, Estimated: 10 mL/min — ABNORMAL LOW (ref >60)
Glucose, Bld: 160 mg/dL — ABNORMAL HIGH (ref 70–99)
Potassium: 5.2 mmol/L — ABNORMAL HIGH (ref 3.5–5.1)
Sodium: 132 mmol/L — ABNORMAL LOW (ref 135–145)

## 2022-08-20 ENCOUNTER — Other Ambulatory Visit: Payer: Self-pay | Admitting: Internal Medicine

## 2022-08-20 ENCOUNTER — Encounter: Payer: Self-pay | Admitting: Internal Medicine

## 2022-08-20 ENCOUNTER — Non-Acute Institutional Stay (SKILLED_NURSING_FACILITY): Payer: 59 | Admitting: Internal Medicine

## 2022-08-20 ENCOUNTER — Other Ambulatory Visit (HOSPITAL_COMMUNITY)
Admission: RE | Admit: 2022-08-20 | Discharge: 2022-08-20 | Disposition: A | Discharge status: Home / Self Care | Payer: 59 | Source: Skilled Nursing Facility | Attending: *Deleted | Admitting: *Deleted

## 2022-08-20 DIAGNOSIS — R600 Localized edema: Secondary | ICD-10-CM | POA: Diagnosis not present

## 2022-08-20 DIAGNOSIS — D631 Anemia in chronic kidney disease: Secondary | ICD-10-CM

## 2022-08-20 DIAGNOSIS — F0150 Vascular dementia without behavioral disturbance: Secondary | ICD-10-CM

## 2022-08-20 DIAGNOSIS — N184 Chronic kidney disease, stage 4 (severe): Secondary | ICD-10-CM | POA: Diagnosis not present

## 2022-08-20 DIAGNOSIS — N179 Acute kidney failure, unspecified: Secondary | ICD-10-CM

## 2022-08-20 DIAGNOSIS — Z8739 Personal history of other diseases of the musculoskeletal system and connective tissue: Secondary | ICD-10-CM | POA: Diagnosis not present

## 2022-08-20 LAB — BASIC METABOLIC PANEL
Anion gap: 11 (ref 5–15)
BUN: 69 mg/dL — ABNORMAL HIGH (ref 8–23)
CO2: 25 mmol/L (ref 22–32)
Calcium: 9.6 mg/dL (ref 8.9–10.3)
Chloride: 97 mmol/L — ABNORMAL LOW (ref 98–111)
Creatinine, Ser: 3.81 mg/dL — ABNORMAL HIGH (ref 0.44–1.00)
GFR, Estimated: 11 mL/min — ABNORMAL LOW (ref >60)
Glucose, Bld: 142 mg/dL — ABNORMAL HIGH (ref 70–99)
Potassium: 5.1 mmol/L (ref 3.5–5.1)
Sodium: 133 mmol/L — ABNORMAL LOW (ref 135–145)

## 2022-08-20 LAB — CBC
HCT: 24.3 % — ABNORMAL LOW (ref 36.0–46.0)
Hemoglobin: 7.4 g/dL — ABNORMAL LOW (ref 12.0–15.0)
MCH: 25.9 pg — ABNORMAL LOW (ref 26.0–34.0)
MCHC: 30.5 g/dL (ref 30.0–36.0)
MCV: 85 fL (ref 80.0–100.0)
Platelets: 194 10*3/uL (ref 150–400)
RBC: 2.86 MIL/uL — ABNORMAL LOW (ref 3.87–5.11)
RDW: 16.9 % — ABNORMAL HIGH (ref 11.5–15.5)
WBC: 6.1 10*3/uL (ref 4.0–10.5)
nRBC: 0 % (ref 0.0–0.2)

## 2022-08-20 LAB — URIC ACID: Uric Acid, Serum: 5.3 mg/dL (ref 2.5–7.1)

## 2022-08-20 LAB — D-DIMER, QUANTITATIVE: D-Dimer, Quant: 8.06 ug/mL-FEU — ABNORMAL HIGH (ref 0.00–0.50)

## 2022-08-20 NOTE — Assessment & Plan Note (Signed)
08/19/2022 uric acid 5.2. Allopurinol had been decreased from 100 mg to 50 mg every other day due to  creat 2.57 & GFR 18. It will be discontinued altogether as creat 4.00 & GFR 11. She complains of pain in the left great toe; but then she labs.  Clinically there is no erythema or tenderness to suggest acute podagra.

## 2022-08-20 NOTE — Assessment & Plan Note (Signed)
She confabulates nonsensically.  She can provide no meaningful history and has difficulty following commands. Basically she is oriented to self only.

## 2022-08-20 NOTE — Assessment & Plan Note (Signed)
Serially the anemia is stable with current H/H of 7.4/24.3.  Serum iron has been initiated. Because of the asymmetric lower extremity edema and dramatically elevated D-dimer; venous Doppler will be ordered and low-dose Eliquis initiated.

## 2022-08-20 NOTE — Progress Notes (Signed)
   NURSING HOME LOCATION:  [facility] ROOM NUMBER:  143P  CODE STATUS: Full code  PCP:  Kirstie Peri MD  This is a nursing facility follow up visit for specific acute issue of abnormal labs including the AKI and clinically significant anemia.  Interim medical record and care since last SNF visit was updated with review of diagnostic studies and change in clinical status since last visit were documented.  HPI: She has developed AKI which serially appears to be improving.  Because of a creatinine of 4.0 and stage IV GFR; allopurinol was discontinued.  Severe anemia is stable with current H/H of 7.4/24.3.  Review of systems: Dementia invalidated responses.  She confabulates about waiting to go home.  She stated "I came downstairs to see when they would pick me up."  Then she stated that she was "here to pick up my daughter."  In reference to history of gout; she denies such, but then stated that her left great toe hurts. . Physical exam:  Pertinent or positive findings: As noted she has profound dementia and is confabulating.  Facies are blank.  Ptosis is greater on the left than the right.  She is only wearing the upper plate.  Grade 1 systolic blowing murmur is present.  Breath sounds are decreased.  Pedal pulses are decreased especially in the left lower extremity.  There is 1+ edema of the left ankle and foot.  There is no erythema or significant change in the left great toe on exam.  She denies pain in the left calf with Homans maneuver.  General appearance: Adequately nourished; no acute distress, increased work of breathing is present.   Lymphatic: No lymphadenopathy about the head, neck, axilla. Eyes: No conjunctival inflammation or lid edema is present. There is no scleral icterus. Ears:  External ear exam shows no significant lesions or deformities.   Nose:  External nasal examination shows no deformity or inflammation. Nasal mucosa are pink and moist without lesions,  exudates Oral exam:  Lips and gums are healthy appearing. There is no oropharyngeal erythema or exudate. Neck:  No thyromegaly, masses, tenderness noted.    Heart:  Normal rate and regular rhythm. S1 and S2 normal without gallop, murmur, click, rub .  Lungs: Chest clear to auscultation without wheezes, rhonchi, rales, rubs. Abdomen: Bowel sounds are normal. Abdomen is soft and nontender with no organomegaly, hernias, masses. GU: Deferred  Extremities:  No cyanosis, clubbing, edema  Neurologic exam : Cn 2-7 intact Strength equal  in upper & lower extremities Balance, Rhomberg, finger to nose testing could not be completed due to clinical state Deep tendon reflexes are equal Skin: Warm & dry w/o tenting. No significant lesions or rash.  See summary under each active problem in the Problem List with associated updated therapeutic plan

## 2022-08-20 NOTE — Assessment & Plan Note (Signed)
Despite the severe anemia in the context of stage V CKD not on dialysis; Eliquis will be initiated pending venous Doppler study.

## 2022-08-20 NOTE — Assessment & Plan Note (Signed)
AKI stage V not on dialysis is stable to slightly improved.

## 2022-08-20 NOTE — Patient Instructions (Signed)
See assessment and plan under each diagnosis in the problem list and acutely for this visit 

## 2022-08-22 ENCOUNTER — Encounter: Payer: Self-pay | Admitting: Internal Medicine

## 2022-08-22 DIAGNOSIS — R7989 Other specified abnormal findings of blood chemistry: Secondary | ICD-10-CM | POA: Insufficient documentation

## 2022-08-23 ENCOUNTER — Encounter (HOSPITAL_COMMUNITY)
Admission: RE | Admit: 2022-08-23 | Discharge: 2022-08-23 | Disposition: A | Discharge status: Home / Self Care | Payer: 59 | Source: Skilled Nursing Facility | Attending: Internal Medicine | Admitting: Internal Medicine

## 2022-08-23 DIAGNOSIS — R791 Abnormal coagulation profile: Secondary | ICD-10-CM | POA: Insufficient documentation

## 2022-08-23 DIAGNOSIS — D649 Anemia, unspecified: Secondary | ICD-10-CM | POA: Insufficient documentation

## 2022-08-23 LAB — BASIC METABOLIC PANEL
Anion gap: 10 (ref 5–15)
BUN: 62 mg/dL — ABNORMAL HIGH (ref 8–23)
CO2: 24 mmol/L (ref 22–32)
Calcium: 9.1 mg/dL (ref 8.9–10.3)
Chloride: 99 mmol/L (ref 98–111)
Creatinine, Ser: 3.2 mg/dL — ABNORMAL HIGH (ref 0.44–1.00)
GFR, Estimated: 14 mL/min — ABNORMAL LOW (ref >60)
Glucose, Bld: 109 mg/dL — ABNORMAL HIGH (ref 70–99)
Potassium: 4.7 mmol/L (ref 3.5–5.1)
Sodium: 133 mmol/L — ABNORMAL LOW (ref 135–145)

## 2022-08-23 LAB — CBC
HCT: 23.3 % — ABNORMAL LOW (ref 36.0–46.0)
Hemoglobin: 7.2 g/dL — ABNORMAL LOW (ref 12.0–15.0)
MCH: 26.2 pg (ref 26.0–34.0)
MCHC: 30.9 g/dL (ref 30.0–36.0)
MCV: 84.7 fL (ref 80.0–100.0)
Platelets: 196 10*3/uL (ref 150–400)
RBC: 2.75 MIL/uL — ABNORMAL LOW (ref 3.87–5.11)
RDW: 17.2 % — ABNORMAL HIGH (ref 11.5–15.5)
WBC: 4.2 10*3/uL (ref 4.0–10.5)
nRBC: 0 % (ref 0.0–0.2)

## 2022-08-23 LAB — D-DIMER, QUANTITATIVE: D-Dimer, Quant: 3.6 ug/mL-FEU — ABNORMAL HIGH (ref 0.00–0.50)

## 2022-08-24 DIAGNOSIS — E039 Hypothyroidism, unspecified: Secondary | ICD-10-CM | POA: Diagnosis not present

## 2022-08-24 DIAGNOSIS — E1122 Type 2 diabetes mellitus with diabetic chronic kidney disease: Secondary | ICD-10-CM | POA: Diagnosis not present

## 2022-08-25 ENCOUNTER — Other Ambulatory Visit (HOSPITAL_COMMUNITY)
Admission: RE | Admit: 2022-08-25 | Discharge: 2022-08-25 | Disposition: A | Discharge status: Home / Self Care | Payer: 59 | Source: Skilled Nursing Facility | Attending: *Deleted | Admitting: *Deleted

## 2022-08-25 ENCOUNTER — Emergency Department (HOSPITAL_COMMUNITY)
Admission: EM | Admit: 2022-08-25 | Discharge: 2022-08-25 | Disposition: A | Discharge status: Skilled Nursing Facility | Payer: 59 | Attending: Emergency Medicine | Admitting: Emergency Medicine

## 2022-08-25 ENCOUNTER — Other Ambulatory Visit: Payer: Self-pay

## 2022-08-25 ENCOUNTER — Encounter (HOSPITAL_COMMUNITY): Payer: Self-pay | Admitting: Emergency Medicine

## 2022-08-25 DIAGNOSIS — N184 Chronic kidney disease, stage 4 (severe): Secondary | ICD-10-CM | POA: Diagnosis not present

## 2022-08-25 DIAGNOSIS — F0390 Unspecified dementia without behavioral disturbance: Secondary | ICD-10-CM | POA: Insufficient documentation

## 2022-08-25 DIAGNOSIS — E039 Hypothyroidism, unspecified: Secondary | ICD-10-CM | POA: Insufficient documentation

## 2022-08-25 DIAGNOSIS — D631 Anemia in chronic kidney disease: Secondary | ICD-10-CM | POA: Insufficient documentation

## 2022-08-25 DIAGNOSIS — R7989 Other specified abnormal findings of blood chemistry: Secondary | ICD-10-CM | POA: Diagnosis present

## 2022-08-25 DIAGNOSIS — Z7982 Long term (current) use of aspirin: Secondary | ICD-10-CM | POA: Diagnosis not present

## 2022-08-25 DIAGNOSIS — I130 Hypertensive heart and chronic kidney disease with heart failure and stage 1 through stage 4 chronic kidney disease, or unspecified chronic kidney disease: Secondary | ICD-10-CM | POA: Insufficient documentation

## 2022-08-25 DIAGNOSIS — Z8551 Personal history of malignant neoplasm of bladder: Secondary | ICD-10-CM | POA: Insufficient documentation

## 2022-08-25 DIAGNOSIS — Z992 Dependence on renal dialysis: Secondary | ICD-10-CM | POA: Diagnosis not present

## 2022-08-25 DIAGNOSIS — I5042 Chronic combined systolic (congestive) and diastolic (congestive) heart failure: Secondary | ICD-10-CM | POA: Diagnosis not present

## 2022-08-25 DIAGNOSIS — Z8659 Personal history of other mental and behavioral disorders: Secondary | ICD-10-CM

## 2022-08-25 DIAGNOSIS — I120 Hypertensive chronic kidney disease with stage 5 chronic kidney disease or end stage renal disease: Secondary | ICD-10-CM | POA: Diagnosis not present

## 2022-08-25 DIAGNOSIS — Z79899 Other long term (current) drug therapy: Secondary | ICD-10-CM | POA: Diagnosis not present

## 2022-08-25 DIAGNOSIS — N186 End stage renal disease: Secondary | ICD-10-CM | POA: Diagnosis not present

## 2022-08-25 DIAGNOSIS — Z87891 Personal history of nicotine dependence: Secondary | ICD-10-CM | POA: Insufficient documentation

## 2022-08-25 DIAGNOSIS — E1122 Type 2 diabetes mellitus with diabetic chronic kidney disease: Secondary | ICD-10-CM | POA: Insufficient documentation

## 2022-08-25 DIAGNOSIS — N189 Chronic kidney disease, unspecified: Secondary | ICD-10-CM | POA: Insufficient documentation

## 2022-08-25 DIAGNOSIS — Z96642 Presence of left artificial hip joint: Secondary | ICD-10-CM | POA: Diagnosis not present

## 2022-08-25 LAB — CBC WITH DIFFERENTIAL/PLATELET
Abs Immature Granulocytes: 0.02 10*3/uL (ref 0.00–0.07)
Basophils Absolute: 0 10*3/uL (ref 0.0–0.1)
Basophils Relative: 1 %
Eosinophils Absolute: 0.1 10*3/uL (ref 0.0–0.5)
Eosinophils Relative: 1 %
HCT: 24.6 % — ABNORMAL LOW (ref 36.0–46.0)
Hemoglobin: 7.5 g/dL — ABNORMAL LOW (ref 12.0–15.0)
Immature Granulocytes: 0 %
Lymphocytes Relative: 18 %
Lymphs Abs: 1.1 10*3/uL (ref 0.7–4.0)
MCH: 26.3 pg (ref 26.0–34.0)
MCHC: 30.5 g/dL (ref 30.0–36.0)
MCV: 86.3 fL (ref 80.0–100.0)
Monocytes Absolute: 0.6 10*3/uL (ref 0.1–1.0)
Monocytes Relative: 9 %
Neutro Abs: 4.6 10*3/uL (ref 1.7–7.7)
Neutrophils Relative %: 71 %
Platelets: 203 10*3/uL (ref 150–400)
RBC: 2.85 MIL/uL — ABNORMAL LOW (ref 3.87–5.11)
RDW: 17.2 % — ABNORMAL HIGH (ref 11.5–15.5)
WBC: 6.4 10*3/uL (ref 4.0–10.5)
nRBC: 0 % (ref 0.0–0.2)

## 2022-08-25 LAB — COMPREHENSIVE METABOLIC PANEL
ALT: 27 U/L (ref 0–44)
AST: 28 U/L (ref 15–41)
Albumin: 2.4 g/dL — ABNORMAL LOW (ref 3.5–5.0)
Alkaline Phosphatase: 103 U/L (ref 38–126)
Anion gap: 11 (ref 5–15)
BUN: 62 mg/dL — ABNORMAL HIGH (ref 8–23)
CO2: 25 mmol/L (ref 22–32)
Calcium: 9.5 mg/dL (ref 8.9–10.3)
Chloride: 98 mmol/L (ref 98–111)
Creatinine, Ser: 2.99 mg/dL — ABNORMAL HIGH (ref 0.44–1.00)
GFR, Estimated: 15 mL/min — ABNORMAL LOW (ref >60)
Glucose, Bld: 252 mg/dL — ABNORMAL HIGH (ref 70–99)
Potassium: 5 mmol/L (ref 3.5–5.1)
Sodium: 134 mmol/L — ABNORMAL LOW (ref 135–145)
Total Bilirubin: 0.4 mg/dL (ref 0.3–1.2)
Total Protein: 6.4 g/dL — ABNORMAL LOW (ref 6.5–8.1)

## 2022-08-25 LAB — CBC
HCT: 22.3 % — ABNORMAL LOW (ref 36.0–46.0)
Hemoglobin: 6.9 g/dL — CL (ref 12.0–15.0)
MCH: 26.4 pg (ref 26.0–34.0)
MCHC: 30.9 g/dL (ref 30.0–36.0)
MCV: 85.4 fL (ref 80.0–100.0)
Platelets: 175 10*3/uL (ref 150–400)
RBC: 2.61 MIL/uL — ABNORMAL LOW (ref 3.87–5.11)
RDW: 17.2 % — ABNORMAL HIGH (ref 11.5–15.5)
WBC: 5.3 10*3/uL (ref 4.0–10.5)
nRBC: 0 % (ref 0.0–0.2)

## 2022-08-25 LAB — PROTIME-INR
INR: 1.3 — ABNORMAL HIGH (ref 0.8–1.2)
Prothrombin Time: 16.7 seconds — ABNORMAL HIGH (ref 11.4–15.2)

## 2022-08-25 LAB — TYPE AND SCREEN
ABO/RH(D): O POS
Antibody Screen: NEGATIVE

## 2022-08-25 NOTE — Discharge Instructions (Signed)
It was a pleasure caring for you today in the emergency department. ° °Please return to the emergency department for any worsening or worrisome symptoms. ° ° °

## 2022-08-25 NOTE — ED Provider Notes (Signed)
[place] EMERGENCY DEPARTMENT AT [facility] Provider Note  CSN: [id number] Arrival date & time: 08/25/22 4098  Chief Complaint(s) Abnormal Lab  HPI Marie Hunt is a 89 y.o. female with past medical history as below, significant for dementia, CKD, anemia of chronic disease, HLD, DM2 who presents to the ED with complaint of abnormal hgb.  She is a poor historian given her history of dementia.  Accompanied by family member at bedside.  Patient has no complaints.  She was sent by facility secondary to abnormal hemoglobin on labs this morning.  She denies any hematemesis vaginal bleeding, hematuria, melena or BRBPR.  States she feels fine and is ready to walk out of the ER.  She has no belly pain, chest pain, dyspnea, nausea or vomiting, no increased weakness from baseline.  No rashes or increased bruising.   Past Medical History Past Medical History:  Diagnosis Date   Anemia    Carotid artery disease (HCC)    1-39% RICA, patent LICA 7/12 - Dr. Edilia Bo   Cholelithiasis    Cholelithiasis    In need of cholecystectomy   Diabetes mellitus    Type II   Essential hypertension, benign    GERD (gastroesophageal reflux disease)    GERD (gastroesophageal reflux disease)    Mixed hyperlipidemia    Sigmoid diverticulosis    Sigmoid diverticulosis    Type 2 diabetes mellitus (HCC)    Patient Active Problem List   Diagnosis Date Noted   Positive D dimer 08/22/2022   Edema of left lower leg 08/20/2022   History of gout 08/19/2022   Secondary DM with CKD stage 4 and hypertension (HCC) 08/15/2022   Vascular dementia without behavioral disturbance (HCC) 08/15/2022   Hypoalbuminemia due to protein-calorie malnutrition (HCC) 08/12/2022   Acquired hypothyroidism 08/12/2022   Type 2 diabetes mellitus with hyperglycemia (HCC) 08/12/2022   Syncope and collapse 08/11/2022   Chronic combined systolic and diastolic CHF (congestive heart failure) (HCC) 07/07/2022   AKI (acute kidney  injury) (HCC) 07/07/2022   Acute respiratory failure with hypoxia (HCC) 07/02/2022   GERD without esophagitis 07/02/2022   Thyroid disease 07/02/2022   Prolonged QT interval 07/02/2022   Acute on chronic congestive heart failure (HCC) 07/02/2022   Elevated troponin 07/02/2022   Acute cystitis with hematuria 04/06/2019   Malignant neoplasm of urinary bladder neck (HCC) 04/06/2019   Colitis, acute 04/24/2016   Cough 10/17/2014   Renal insufficiency 10/17/2014   UTI (urinary tract infection) 10/07/2014   Anemia 10/07/2014   Fever    Type 2 diabetes mellitus without complication (HCC)    Hip fracture (HCC) 10/02/2014   Hip fx (HCC) 10/02/2014   Type 2 diabetes mellitus (HCC)    Fall    Aftercare following surgery of the circulatory system, NEC 10/26/2013   Occlusion and stenosis of carotid artery without mention of cerebral infarction 09/17/2011   Preoperative evaluation to rule out surgical contraindication 11/15/2010   Cardiac murmur 11/15/2010   Essential hypertension 11/08/2010   Carotid artery disease (HCC) 11/08/2010   Diabetes in pregnancy 11/08/2010   Hyperlipidemia with target low density lipoprotein (LDL) cholesterol less than 100 mg/dL 11/91/4782   Cholelithiasis 11/08/2010   Home Medication(s) Prior to Admission medications   Medication Sig Start Date End Date Taking? Authorizing Provider  acetaminophen (TYLENOL) 325 MG tablet Take 2 tablets (650 mg total) by mouth every 6 (six) hours as needed for mild pain, headache or fever (or Fever >/= 101). 08/13/22   Johnson, Clanford L,  MD  allopurinol (ZYLOPRIM) 100 MG tablet Take 100 mg by mouth every morning.     [provider]  aspirin EC 81 MG tablet Take 81 mg by mouth every evening.     [provider]  diclofenac Sodium (VOLTAREN) 1 % GEL Apply 2 g topically daily as needed (for pain). 03/25/19   [provider]  diphenhydrAMINE (BENADRYL) 2 % cream Apply 1 application  topically 3 (three) times  daily as needed for itching (rash).    [provider]  famotidine (PEPCID) 20 MG tablet Take 20 mg by mouth at bedtime.     [provider]  feeding supplement, GLUCERNA SHAKE, (GLUCERNA SHAKE) LIQD Take 237 mLs by mouth 3 (three) times daily between meals. 08/13/22   Johnson, Clanford L, MD  folic acid (FOLVITE) 400 MCG tablet Take 400 mcg by mouth every morning.     [provider]  furosemide (LASIX) 40 MG tablet Take 1 tablet (40 mg total) by mouth daily. 07/08/22   Osvaldo Shipper, MD  Global Inject Ease Lancets 30G MISC See admin instructions. 06/23/19   [provider]  Glycerin-Hypromellose-PEG 400 (DRY EYE RELIEF DROPS) 0.2-0.2-1 % SOLN Apply 1 drop to eye daily as needed (dry eyes).    [provider]  hydrALAZINE (APRESOLINE) 25 MG tablet Take 1 tablet (25 mg total) by mouth every 8 (eight) hours. 07/08/22   Osvaldo Shipper, MD  isosorbide mononitrate (IMDUR) 30 MG 24 hr tablet Take 0.5 tablets (15 mg total) by mouth daily. 07/09/22   Osvaldo Shipper, MD  levothyroxine (SYNTHROID) 75 MCG tablet Take 0.5 tablets (37.5 mcg total) by mouth daily at 6 (six) AM. 07/09/22   Osvaldo Shipper, MD  linagliptin (TRADJENTA) 5 MG TABS tablet Take 1 tablet (5 mg total) by mouth daily. 08/13/22   Johnson, Clanford L, MD  Melatonin 10 MG TABS Take 1 tablet by mouth at bedtime. 08/13/22   Johnson, Clanford L, MD  metoprolol succinate (TOPROL-XL) 25 MG 24 hr tablet Take 0.5 tablets (12.5 mg total) by mouth daily. 07/09/22   Osvaldo Shipper, MD  Multiple Vitamin (MULTIVITAMIN WITH MINERALS) TABS tablet Take 1 tablet by mouth daily. 08/14/22   Cleora Fleet, MD  [facility] VERIO test strip 1 each daily. 06/23/19   [provider]  simvastatin (ZOCOR) 20 MG tablet Take 20 mg by mouth at bedtime.    [provider]  traZODone (DESYREL) 50 MG tablet Take 1 tablet (50 mg total) by mouth at bedtime. 08/13/22   Cleora Fleet, MD                                                                                                                                     Past Surgical History Past Surgical History:  Procedure Laterality Date   CAROTID ENDARTERECTOMY  08/24/2009   Left catroid endarterectomy   CATARACT EXTRACTION W/PHACO Left 08/17/2014   Procedure: CATARACT  EXTRACTION PHACO AND INTRAOCULAR LENS PLACEMENT; CDE:  7.39;  Surgeon: Gemma Payor, MD;  Location: [facility];  Service: Ophthalmology;  Laterality: Left;   CATARACT EXTRACTION W/PHACO Right 10/02/2014   Procedure: CATARACT EXTRACTION PHACO AND INTRAOCULAR LENS PLACEMENT RIGHT EYE CDE=5.83;  Surgeon: Gemma Payor, MD;  Location: [facility];  Service: Ophthalmology;  Laterality: Right;   CYSTOSCOPY W/ RETROGRADES Bilateral 03/28/2019   Procedure: CYSTOSCOPY WITH BILATRAL RETROGRADE PYELOGRAM,;  Surgeon: Malen Gauze, MD;  Location: [facility];  Service: Urology;  Laterality: Bilateral;   EYE SURGERY     HIP ARTHROPLASTY Left 10/03/2014   Procedure: ARTHROPLASTY BIPOLAR HIP (HEMIARTHROPLASTY);  Surgeon: Darreld Mclean, MD;  Location: [facility];  Service: Orthopedics;  Laterality: Left;   Left carotid endarterectomy  7/11   Dr. Edilia Bo   TRANSURETHRAL RESECTION OF BLADDER TUMOR N/A 03/28/2019   Procedure: TRANSURETHRAL RESECTION OF BLADDER TUMOR (TURBT);  Surgeon: Malen Gauze, MD;  Location: [facility];  Service: Urology;  Laterality: N/A;   URETEROSCOPY Left 03/28/2019   Procedure: DIAGNOSTIC URETEROSCOPY WITH LEFT URETERAL STENT PLACEMENT;  Surgeon: Malen Gauze, MD;  Location: [facility];  Service: Urology;  Laterality: Left;   Family History Family History  Problem Relation Age of Onset   Diabetes Mother    Diabetes Father    Heart attack Daughter    Other Daughter        Bleeding problems   Cancer Daughter    Coronary artery disease Other    Heart disease Sister    Diabetes Sister    Hyperlipidemia Sister    Hypertension Sister    Heart disease Sister    Heart disease Sister      Social History Social History   Tobacco Use   Smoking status: Former    Packs/day: 0.30    Years: 60.00    Additional pack years: 0.00    Total pack years: 18.00    Types: Cigarettes    Quit date: 10/27/2011    Years since quitting: 10.8   Smokeless tobacco: Never  Substance Use Topics   Alcohol use: No    Alcohol/week: 0.0 standard drinks of alcohol   Drug use: No   Allergies Codeine  Review of Systems Review of Systems  Unable to perform ROS: Dementia    Physical Exam Vital Signs  I have reviewed the triage vital signs BP 137/70   Pulse 76   Temp 98.7 F (37.1 C) (Oral)   Resp 19   SpO2 92%  Physical Exam Vitals and nursing note reviewed.  Constitutional:      General: She is not in acute distress.    Appearance: She is not ill-appearing.  HENT:     Head: Normocephalic and atraumatic.     Right Ear: External ear normal.     Left Ear: External ear normal.     Nose: Nose normal.     Mouth/Throat:     Mouth: Mucous membranes are moist.  Eyes:     General: No scleral icterus.       Right eye: No discharge.        Left eye: No discharge.  Cardiovascular:     Rate and Rhythm: Normal rate and regular rhythm.     Pulses: Normal pulses.     Heart sounds: Normal heart sounds.  Pulmonary:     Effort: Pulmonary effort is normal. No respiratory distress.     Breath sounds: Normal breath sounds.  Abdominal:     General: Abdomen is flat.  Palpations: Abdomen is soft.     Tenderness: There is no abdominal tenderness. There is no guarding.  Musculoskeletal:        General: Normal range of motion.     Cervical back: No rigidity.     Right lower leg: No edema.     Left lower leg: No edema.  Skin:    General: Skin is warm and dry.     Capillary Refill: Capillary refill takes less than 2 seconds.  Neurological:     Mental Status: She is alert. Mental status is at baseline.  Psychiatric:        Mood and Affect: Mood normal.        Behavior: Behavior  normal.     ED Results and Treatments Labs (all labs ordered are listed, but only abnormal results are displayed) Labs Reviewed  CBC WITH DIFFERENTIAL/PLATELET - Abnormal; Notable for the following components:      Result Value   RBC 2.85 (*)    Hemoglobin 7.5 (*)    HCT 24.6 (*)    RDW 17.2 (*)    All other components within normal limits  COMPREHENSIVE METABOLIC PANEL - Abnormal; Notable for the following components:   Sodium 134 (*)    Glucose, Bld 252 (*)    BUN 62 (*)    Creatinine, Ser 2.99 (*)    Total Protein 6.4 (*)    Albumin 2.4 (*)    GFR, Estimated 15 (*)    All other components within normal limits  PROTIME-INR - Abnormal; Notable for the following components:   Prothrombin Time 16.7 (*)    INR 1.3 (*)    All other components within normal limits  TYPE AND SCREEN                                                                                                                          Radiology No results found.  Pertinent labs & imaging results that were available during my care of the patient were reviewed by me and considered in my medical decision making (see MDM for details).  Medications Ordered in ED Medications - No data to display                                                                                                                                   Procedures Procedures  (including critical care time)  Medical Decision  Making / ED Course    Medical Decision Making:    TEERICA REITMAN is a 89 y.o. female with past medical history as below, significant for dementia, CKD, anemia of chronic disease, HLD, DM2 who presents to the ED with complaint of abnormal hgb. . The complaint involves an extensive differential diagnosis and also carries with it a high risk of complications and morbidity.  Serious etiology was considered. Ddx includes but is not limited to: Anemia of chronic disease, acute blood loss anemia, electrolyte derangement,  hematologic derangement, etc.  Complete initial physical exam performed, notably the patient  was no acute distress, resting comfortably on stretcher, no complaints.  Family bedside.    Reviewed and confirmed nursing documentation for past medical history, family history, social history.  Vital signs reviewed.    Clinical Course as of 08/25/22 1253  Mon Aug 25, 2022  1106 Hemoglobin(!): 7.5 Similar to her baseline [SG]    Clinical Course User Index [SG] Sloan Leiter, DO   Patient reports that she is feeling normal and has no complaints.  Discussed with family bedside regarding presentation today.  Labs reviewed and are stable.  Hemoglobin is back to her baseline.  No need for blood transfusion.  Favor sequela of anemia of chronic disease.  Plan to discharge back to facility  The patient improved significantly and was discharged in stable condition. Detailed discussions were had with the patient regarding current findings, and need for close f/u with PCP or on call doctor. The patient has been instructed to return immediately if the symptoms worsen in any way for re-evaluation. Patient verbalized understanding and is in agreement with current care plan. All questions answered prior to discharge.       Additional history obtained: -Additional history obtained from family -External records from outside source obtained and reviewed including: Chart review including previous notes, labs, imaging, consultation notes including primary care recommendation, prior labs and imaging, medications, prior ED visits   Lab Tests: -I ordered, reviewed, and interpreted labs.   The pertinent results include:   Labs Reviewed  CBC WITH DIFFERENTIAL/PLATELET - Abnormal; Notable for the following components:      Result Value   RBC 2.85 (*)    Hemoglobin 7.5 (*)    HCT 24.6 (*)    RDW 17.2 (*)    All other components within normal limits  COMPREHENSIVE METABOLIC PANEL - Abnormal; Notable for the  following components:   Sodium 134 (*)    Glucose, Bld 252 (*)    BUN 62 (*)    Creatinine, Ser 2.99 (*)    Total Protein 6.4 (*)    Albumin 2.4 (*)    GFR, Estimated 15 (*)    All other components within normal limits  PROTIME-INR - Abnormal; Notable for the following components:   Prothrombin Time 16.7 (*)    INR 1.3 (*)    All other components within normal limits  TYPE AND SCREEN    Notable for as above, hemoglobin stable.  Creatinine similar to baseline.  EKG   EKG Interpretation Date/Time:    Ventricular Rate:    PR Interval:    QRS Duration:    QT Interval:    QTC Calculation:   R Axis:      Text Interpretation:           Imaging Studies ordered: na   Medicines ordered and prescription drug management: No orders of the defined types were placed in this encounter.   -I have  reviewed the patients home medicines and have made adjustments as needed   Consultations Obtained: na   Cardiac Monitoring: The patient was maintained on a cardiac monitor.  I personally viewed and interpreted the cardiac monitored which showed an underlying rhythm of: sr  Social Determinants of Health:  Diagnosis or treatment significantly limited by social determinants of health: former smoker   Reevaluation: After the interventions noted above, I reevaluated the patient and found that they have stayed the same  Co morbidities that complicate the patient evaluation  Past Medical History:  Diagnosis Date   Anemia    Carotid artery disease (HCC)    1-39% RICA, patent LICA 7/12 - Dr. Edilia Bo   Cholelithiasis    Cholelithiasis    In need of cholecystectomy   Diabetes mellitus    Type II   Essential hypertension, benign    GERD (gastroesophageal reflux disease)    GERD (gastroesophageal reflux disease)    Mixed hyperlipidemia    Sigmoid diverticulosis    Sigmoid diverticulosis    Type 2 diabetes mellitus (HCC)       Dispostion: Disposition decision including need  for hospitalization was considered, and patient discharged from emergency department.    Final Clinical Impression(s) / ED Diagnoses Final diagnoses:  Anemia due to chronic kidney disease, on chronic dialysis [facility])  History of dementia     This chart was dictated using voice recognition software.  Despite best efforts to proofread,  errors can occur which can change the documentation meaning.    Tanda Rockers A, DO 08/25/22 1253

## 2022-08-25 NOTE — ED Triage Notes (Signed)
Pt BIB [facility] from [facility] with reports of low Hgb. Pt has no complaints at this time.

## 2022-08-25 NOTE — ED Notes (Signed)
Call for report attempted to facility. No answer

## 2022-08-27 ENCOUNTER — Encounter: Payer: Self-pay | Admitting: Urology

## 2022-08-27 ENCOUNTER — Ambulatory Visit (INDEPENDENT_AMBULATORY_CARE_PROVIDER_SITE_OTHER): Payer: 59 | Admitting: Urology

## 2022-08-27 VITALS — BP 128/62 | HR 68

## 2022-08-27 DIAGNOSIS — Z8551 Personal history of malignant neoplasm of bladder: Secondary | ICD-10-CM | POA: Diagnosis not present

## 2022-08-27 DIAGNOSIS — C675 Malignant neoplasm of bladder neck: Secondary | ICD-10-CM

## 2022-08-27 MED ORDER — CIPROFLOXACIN HCL 500 MG PO TABS
500.0000 mg | ORAL_TABLET | Freq: Once | ORAL | Status: AC
Start: 2022-08-27 — End: 2022-08-27
  Administered 2022-08-27: 500 mg via ORAL

## 2022-08-27 NOTE — Patient Instructions (Signed)

## 2022-08-27 NOTE — Progress Notes (Signed)
   08/27/22  CC: followup bladder cancer   HPI: Marie Hunt is a 89yo here for followup for bladder cancer Blood pressure 128/62, pulse 68. NED. A&Ox3.   No respiratory distress   Abd soft, NT, ND Normal external genitalia with patent urethral meatus  Cystoscopy Procedure Note  Patient identification was confirmed, informed consent was obtained, and patient was prepped using Betadine solution.  Lidocaine jelly was administered per urethral meatus.    Procedure: - Flexible cystoscope introduced, without any difficulty.   - Thorough search of the bladder revealed:    normal urethral meatus    normal urothelium    no stones    no ulcers     no tumors    no urethral polyps    no trabeculation  - Ureteral orifices were normal in position and appearance.  Post-Procedure: - Patient tolerated the procedure well  Assessment/ Plan: Follouwp 6 months for cystoscopy   No follow-ups on file.  Wilkie Aye, MD

## 2022-09-01 ENCOUNTER — Non-Acute Institutional Stay (SKILLED_NURSING_FACILITY): Payer: 59 | Admitting: Adult Health

## 2022-09-01 ENCOUNTER — Encounter: Payer: Self-pay | Admitting: Adult Health

## 2022-09-01 DIAGNOSIS — E1165 Type 2 diabetes mellitus with hyperglycemia: Secondary | ICD-10-CM

## 2022-09-01 DIAGNOSIS — I5043 Acute on chronic combined systolic (congestive) and diastolic (congestive) heart failure: Secondary | ICD-10-CM

## 2022-09-01 DIAGNOSIS — F0150 Vascular dementia without behavioral disturbance: Secondary | ICD-10-CM

## 2022-09-01 NOTE — Progress Notes (Unsigned)
Location:  [facility] Room Number: 143 Place of Service:  SNF (31)   CODE STATUS: full   Allergies  Allergen Reactions   Codeine Nausea And Vomiting    Chief Complaint  Patient presents with   Discharge Note    HPI:  She is being discharged to another SNF for a secured unit. She had been hospitalized for syncope. She was admitted to this facility for short term rehab. Therapy: ambulates 200 feet with rolling walker with supervision; upper body min supervision; lower body min assist. She requires a secured unit.   Past Medical History:  Diagnosis Date   Anemia    Carotid artery disease (HCC)    1-39% RICA, patent LICA 7/12 - Dr. Edilia Bo   Cholelithiasis    Cholelithiasis    In need of cholecystectomy   Diabetes mellitus    Type II   Essential hypertension, benign    GERD (gastroesophageal reflux disease)    GERD (gastroesophageal reflux disease)    Mixed hyperlipidemia    Sigmoid diverticulosis    Sigmoid diverticulosis    Type 2 diabetes mellitus (HCC)     Past Surgical History:  Procedure Laterality Date   CAROTID ENDARTERECTOMY  08/24/2009   Left catroid endarterectomy   CATARACT EXTRACTION W/PHACO Left 08/17/2014   Procedure: CATARACT EXTRACTION PHACO AND INTRAOCULAR LENS PLACEMENT; CDE:  7.39;  Surgeon: Gemma Payor, MD;  Location: [facility];  Service: Ophthalmology;  Laterality: Left;   CATARACT EXTRACTION W/PHACO Right 10/02/2014   Procedure: CATARACT EXTRACTION PHACO AND INTRAOCULAR LENS PLACEMENT RIGHT EYE CDE=5.83;  Surgeon: Gemma Payor, MD;  Location: [facility];  Service: Ophthalmology;  Laterality: Right;   CYSTOSCOPY W/ RETROGRADES Bilateral 03/28/2019   Procedure: CYSTOSCOPY WITH BILATRAL RETROGRADE PYELOGRAM,;  Surgeon: Malen Gauze, MD;  Location: [facility];  Service: Urology;  Laterality: Bilateral;   EYE SURGERY     HIP ARTHROPLASTY Left 10/03/2014   Procedure: ARTHROPLASTY BIPOLAR HIP (HEMIARTHROPLASTY);  Surgeon: Darreld Mclean, MD;   Location: [facility];  Service: Orthopedics;  Laterality: Left;   Left carotid endarterectomy  7/11   Dr. Edilia Bo   TRANSURETHRAL RESECTION OF BLADDER TUMOR N/A 03/28/2019   Procedure: TRANSURETHRAL RESECTION OF BLADDER TUMOR (TURBT);  Surgeon: Malen Gauze, MD;  Location: [facility];  Service: Urology;  Laterality: N/A;   URETEROSCOPY Left 03/28/2019   Procedure: DIAGNOSTIC URETEROSCOPY WITH LEFT URETERAL STENT PLACEMENT;  Surgeon: Malen Gauze, MD;  Location: [facility];  Service: Urology;  Laterality: Left;    Social History   Socioeconomic History   Marital status: Widowed    Spouse name: Not on file   Number of children: Not on file   Years of education: Not on file   Highest education level: Not on file  Occupational History   Not on file  Tobacco Use   Smoking status: Former    Packs/day: 0.30    Years: 60.00    Additional pack years: 0.00    Total pack years: 18.00    Types: Cigarettes    Quit date: 10/27/2011    Years since quitting: 10.8   Smokeless tobacco: Never  Substance and Sexual Activity   Alcohol use: No    Alcohol/week: 0.0 standard drinks of alcohol   Drug use: No   Sexual activity: Never  Other Topics Concern   Not on file  Social History Narrative   Not on file   Social Determinants of Health   Financial Resource Strain: Not on file  Food Insecurity: No Food Insecurity (08/12/2022)   Hunger Vital Sign    Worried About Running Out of Food in the Last Year: Never true    Ran Out of Food in the Last Year: Never true  Transportation Needs: No Transportation Needs (08/12/2022)   PRAPARE - Administrator, Civil Service (Medical): No    Lack of Transportation (Non-Medical): No  Physical Activity: Not on file  Stress: Not on file  Social Connections: Not on file  Intimate Partner Violence: Not At Risk (08/12/2022)   Humiliation, Afraid, Rape, and Kick questionnaire    Fear of Current or Ex-Partner: No    Emotionally Abused: No    Physically  Abused: No    Sexually Abused: No   Family History  Problem Relation Age of Onset   Diabetes Mother    Diabetes Father    Heart attack Daughter    Other Daughter        Bleeding problems   Cancer Daughter    Coronary artery disease Other    Heart disease Sister    Diabetes Sister    Hyperlipidemia Sister    Hypertension Sister    Heart disease Sister    Heart disease Sister       VITAL SIGNS BP 110/62   Pulse 84   Temp 98.2 F (36.8 C)   Resp 16   Ht 5\' 4"  (1.626 m)   Wt 122 lb 4.8 oz (55.5 kg)   SpO2 96%   BMI 20.99 kg/m   Outpatient Encounter Medications as of 09/01/2022  Medication Sig   acetaminophen (TYLENOL) 325 MG tablet Take 2 tablets (650 mg total) by mouth every 6 (six) hours as needed for mild pain, headache or fever (or Fever >/= 101).   allopurinol (ZYLOPRIM) 100 MG tablet Take 100 mg by mouth every morning.    aspirin EC 81 MG tablet Take 81 mg by mouth every evening.    diclofenac Sodium (VOLTAREN) 1 % GEL Apply 2 g topically daily as needed (for pain).   diphenhydrAMINE (BENADRYL) 2 % cream Apply 1 application  topically 3 (three) times daily as needed for itching (rash).   famotidine (PEPCID) 20 MG tablet Take 20 mg by mouth at bedtime.    feeding supplement, GLUCERNA SHAKE, (GLUCERNA SHAKE) LIQD Take 237 mLs by mouth 3 (three) times daily between meals.   folic acid (FOLVITE) 400 MCG tablet Take 400 mcg by mouth every morning.    furosemide (LASIX) 40 MG tablet Take 1 tablet (40 mg total) by mouth daily.   Global Inject Ease Lancets 30G MISC See admin instructions.   Glycerin-Hypromellose-PEG 400 (DRY EYE RELIEF DROPS) 0.2-0.2-1 % SOLN Apply 1 drop to eye daily as needed (dry eyes).   hydrALAZINE (APRESOLINE) 25 MG tablet Take 1 tablet (25 mg total) by mouth every 8 (eight) hours.   isosorbide mononitrate (IMDUR) 30 MG 24 hr tablet Take 0.5 tablets (15 mg total) by mouth daily.   levothyroxine (SYNTHROID) 75 MCG tablet Take 0.5 tablets (37.5 mcg total)  by mouth daily at 6 (six) AM.   linagliptin (TRADJENTA) 5 MG TABS tablet Take 1 tablet (5 mg total) by mouth daily.   Melatonin 10 MG TABS Take 1 tablet by mouth at bedtime.   metoprolol succinate (TOPROL-XL) 25 MG 24 hr tablet Take 0.5 tablets (12.5 mg total) by mouth daily.   Multiple Vitamin (MULTIVITAMIN WITH MINERALS) TABS tablet Take 1 tablet by mouth daily.   ONETOUCH VERIO test strip 1  each daily.   simvastatin (ZOCOR) 20 MG tablet Take 20 mg by mouth at bedtime.   traZODone (DESYREL) 50 MG tablet Take 1 tablet (50 mg total) by mouth at bedtime.   No facility-administered encounter medications on file as of 09/01/2022.     SIGNIFICANT DIAGNOSTIC EXAMS  Review of Systems  Unable to perform ROS: Dementia   Physical Exam Constitutional:      General: She is not in acute distress.    Appearance: She is well-developed. She is not diaphoretic.  Neck:     Thyroid: No thyromegaly.  Cardiovascular:     Rate and Rhythm: Normal rate and regular rhythm.     Pulses: Normal pulses.     Heart sounds: Normal heart sounds.  Pulmonary:     Effort: Pulmonary effort is normal. No respiratory distress.     Breath sounds: Normal breath sounds.  Abdominal:     General: Bowel sounds are normal. There is no distension.     Palpations: Abdomen is soft.     Tenderness: There is no abdominal tenderness.  Musculoskeletal:        General: Normal range of motion.     Cervical back: Neck supple.     Right lower leg: No edema.     Left lower leg: No edema.  Lymphadenopathy:     Cervical: No cervical adenopathy.  Skin:    General: Skin is warm and dry.  Neurological:     Mental Status: She is alert. Mental status is at baseline.  Psychiatric:        Mood and Affect: Mood normal.       ASSESSMENT/ PLAN:  Patient is being discharged with the following home health services:  none   Patient is being discharged with the following durable medical equipment:  none   Patient has been advised  to f/u with their PCP in 1-2 weeks to for a transitions of care visit.  Social services at their facility was responsible for arranging this appointment.  Pt was provided with adequate prescriptions of noncontrolled medications to reach the scheduled appointment .  For controlled substances, a limited supply was provided as appropriate for the individual patient.  If the pt normally receives these medications from a pain clinic or has a contract with another physician, these medications should be received from that clinic or physician only).    The facility will provide medications; and medical follow up   Synthia Innocent NP [facility]  call [phone]

## 2022-09-04 DIAGNOSIS — Z79899 Other long term (current) drug therapy: Secondary | ICD-10-CM | POA: Diagnosis not present

## 2022-09-04 DIAGNOSIS — K219 Gastro-esophageal reflux disease without esophagitis: Secondary | ICD-10-CM | POA: Diagnosis not present

## 2022-09-04 DIAGNOSIS — R600 Localized edema: Secondary | ICD-10-CM | POA: Diagnosis not present

## 2022-09-04 DIAGNOSIS — M6281 Muscle weakness (generalized): Secondary | ICD-10-CM | POA: Diagnosis not present

## 2022-09-04 DIAGNOSIS — I5042 Chronic combined systolic (congestive) and diastolic (congestive) heart failure: Secondary | ICD-10-CM | POA: Diagnosis not present

## 2022-09-08 DIAGNOSIS — N184 Chronic kidney disease, stage 4 (severe): Secondary | ICD-10-CM | POA: Diagnosis not present

## 2022-09-08 DIAGNOSIS — H34811 Central retinal vein occlusion, right eye, with macular edema: Secondary | ICD-10-CM | POA: Diagnosis not present

## 2022-09-08 DIAGNOSIS — Z0000 Encounter for general adult medical examination without abnormal findings: Secondary | ICD-10-CM | POA: Diagnosis not present

## 2022-09-08 DIAGNOSIS — R600 Localized edema: Secondary | ICD-10-CM | POA: Diagnosis not present

## 2022-09-08 DIAGNOSIS — I10 Essential (primary) hypertension: Secondary | ICD-10-CM | POA: Diagnosis not present

## 2022-09-08 DIAGNOSIS — H35033 Hypertensive retinopathy, bilateral: Secondary | ICD-10-CM | POA: Diagnosis not present

## 2022-09-08 DIAGNOSIS — H3582 Retinal ischemia: Secondary | ICD-10-CM | POA: Diagnosis not present

## 2022-09-08 DIAGNOSIS — I739 Peripheral vascular disease, unspecified: Secondary | ICD-10-CM | POA: Diagnosis not present

## 2022-09-08 DIAGNOSIS — H43812 Vitreous degeneration, left eye: Secondary | ICD-10-CM | POA: Diagnosis not present

## 2022-09-08 DIAGNOSIS — I2510 Atherosclerotic heart disease of native coronary artery without angina pectoris: Secondary | ICD-10-CM | POA: Diagnosis not present

## 2022-09-08 DIAGNOSIS — E785 Hyperlipidemia, unspecified: Secondary | ICD-10-CM | POA: Diagnosis not present

## 2022-09-08 DIAGNOSIS — G4700 Insomnia, unspecified: Secondary | ICD-10-CM | POA: Diagnosis not present

## 2022-09-08 DIAGNOSIS — Z79899 Other long term (current) drug therapy: Secondary | ICD-10-CM | POA: Diagnosis not present

## 2022-09-08 DIAGNOSIS — I509 Heart failure, unspecified: Secondary | ICD-10-CM | POA: Diagnosis not present

## 2022-09-08 DIAGNOSIS — Z299 Encounter for prophylactic measures, unspecified: Secondary | ICD-10-CM | POA: Diagnosis not present

## 2022-09-09 DIAGNOSIS — Z7189 Other specified counseling: Secondary | ICD-10-CM | POA: Diagnosis not present

## 2022-09-09 DIAGNOSIS — E1165 Type 2 diabetes mellitus with hyperglycemia: Secondary | ICD-10-CM | POA: Diagnosis not present

## 2022-09-09 DIAGNOSIS — Z79899 Other long term (current) drug therapy: Secondary | ICD-10-CM | POA: Diagnosis not present

## 2022-09-09 DIAGNOSIS — E039 Hypothyroidism, unspecified: Secondary | ICD-10-CM | POA: Diagnosis not present

## 2022-09-09 DIAGNOSIS — M109 Gout, unspecified: Secondary | ICD-10-CM | POA: Diagnosis not present

## 2022-09-10 DIAGNOSIS — M109 Gout, unspecified: Secondary | ICD-10-CM | POA: Diagnosis not present

## 2022-09-10 DIAGNOSIS — R600 Localized edema: Secondary | ICD-10-CM | POA: Diagnosis not present

## 2022-09-10 DIAGNOSIS — E785 Hyperlipidemia, unspecified: Secondary | ICD-10-CM | POA: Diagnosis not present

## 2022-09-10 DIAGNOSIS — E039 Hypothyroidism, unspecified: Secondary | ICD-10-CM | POA: Diagnosis not present

## 2022-09-10 DIAGNOSIS — N184 Chronic kidney disease, stage 4 (severe): Secondary | ICD-10-CM | POA: Diagnosis not present

## 2022-09-10 DIAGNOSIS — I2510 Atherosclerotic heart disease of native coronary artery without angina pectoris: Secondary | ICD-10-CM | POA: Diagnosis not present

## 2022-09-10 DIAGNOSIS — Z9181 History of falling: Secondary | ICD-10-CM | POA: Diagnosis not present

## 2022-09-10 DIAGNOSIS — I5042 Chronic combined systolic (congestive) and diastolic (congestive) heart failure: Secondary | ICD-10-CM | POA: Diagnosis not present

## 2022-09-10 DIAGNOSIS — K219 Gastro-esophageal reflux disease without esophagitis: Secondary | ICD-10-CM | POA: Diagnosis not present

## 2022-09-10 DIAGNOSIS — E1165 Type 2 diabetes mellitus with hyperglycemia: Secondary | ICD-10-CM | POA: Diagnosis not present

## 2022-09-16 DIAGNOSIS — Z79899 Other long term (current) drug therapy: Secondary | ICD-10-CM | POA: Diagnosis not present

## 2022-09-16 DIAGNOSIS — R600 Localized edema: Secondary | ICD-10-CM | POA: Diagnosis not present

## 2022-10-01 ENCOUNTER — Emergency Department (HOSPITAL_COMMUNITY): Payer: 59

## 2022-10-01 ENCOUNTER — Encounter (HOSPITAL_COMMUNITY): Payer: Self-pay | Admitting: *Deleted

## 2022-10-01 ENCOUNTER — Other Ambulatory Visit: Payer: Self-pay

## 2022-10-01 ENCOUNTER — Inpatient Hospital Stay (HOSPITAL_COMMUNITY)
Admission: EM | Admit: 2022-10-01 | Discharge: 2022-10-09 | DRG: 193 | Disposition: A | Discharge status: Skilled Nursing Facility | Payer: 59 | Source: Skilled Nursing Facility | Attending: Internal Medicine | Admitting: Internal Medicine

## 2022-10-01 ENCOUNTER — Inpatient Hospital Stay (HOSPITAL_COMMUNITY): Payer: 59

## 2022-10-01 DIAGNOSIS — E119 Type 2 diabetes mellitus without complications: Secondary | ICD-10-CM | POA: Diagnosis not present

## 2022-10-01 DIAGNOSIS — F0150 Vascular dementia without behavioral disturbance: Secondary | ICD-10-CM | POA: Diagnosis not present

## 2022-10-01 DIAGNOSIS — I130 Hypertensive heart and chronic kidney disease with heart failure and stage 1 through stage 4 chronic kidney disease, or unspecified chronic kidney disease: Secondary | ICD-10-CM | POA: Diagnosis present

## 2022-10-01 DIAGNOSIS — R609 Edema, unspecified: Secondary | ICD-10-CM | POA: Diagnosis not present

## 2022-10-01 DIAGNOSIS — J984 Other disorders of lung: Secondary | ICD-10-CM | POA: Diagnosis not present

## 2022-10-01 DIAGNOSIS — M19072 Primary osteoarthritis, left ankle and foot: Secondary | ICD-10-CM | POA: Diagnosis not present

## 2022-10-01 DIAGNOSIS — I5042 Chronic combined systolic (congestive) and diastolic (congestive) heart failure: Secondary | ICD-10-CM | POA: Diagnosis present

## 2022-10-01 DIAGNOSIS — L03116 Cellulitis of left lower limb: Secondary | ICD-10-CM | POA: Diagnosis present

## 2022-10-01 DIAGNOSIS — M85872 Other specified disorders of bone density and structure, left ankle and foot: Secondary | ICD-10-CM | POA: Diagnosis not present

## 2022-10-01 DIAGNOSIS — J90 Pleural effusion, not elsewhere classified: Secondary | ICD-10-CM | POA: Diagnosis not present

## 2022-10-01 DIAGNOSIS — N184 Chronic kidney disease, stage 4 (severe): Secondary | ICD-10-CM | POA: Diagnosis present

## 2022-10-01 DIAGNOSIS — B950 Streptococcus, group A, as the cause of diseases classified elsewhere: Secondary | ICD-10-CM | POA: Diagnosis not present

## 2022-10-01 DIAGNOSIS — M79672 Pain in left foot: Secondary | ICD-10-CM | POA: Diagnosis not present

## 2022-10-01 DIAGNOSIS — J918 Pleural effusion in other conditions classified elsewhere: Secondary | ICD-10-CM | POA: Diagnosis present

## 2022-10-01 DIAGNOSIS — E039 Hypothyroidism, unspecified: Secondary | ICD-10-CM | POA: Diagnosis present

## 2022-10-01 DIAGNOSIS — M109 Gout, unspecified: Secondary | ICD-10-CM | POA: Diagnosis present

## 2022-10-01 DIAGNOSIS — M7989 Other specified soft tissue disorders: Secondary | ICD-10-CM | POA: Diagnosis not present

## 2022-10-01 DIAGNOSIS — R7881 Bacteremia: Secondary | ICD-10-CM | POA: Diagnosis present

## 2022-10-01 DIAGNOSIS — R0902 Hypoxemia: Secondary | ICD-10-CM | POA: Diagnosis present

## 2022-10-01 DIAGNOSIS — E1322 Other specified diabetes mellitus with diabetic chronic kidney disease: Secondary | ICD-10-CM | POA: Diagnosis present

## 2022-10-01 DIAGNOSIS — Z7982 Long term (current) use of aspirin: Secondary | ICD-10-CM

## 2022-10-01 DIAGNOSIS — Z1152 Encounter for screening for COVID-19: Secondary | ICD-10-CM

## 2022-10-01 DIAGNOSIS — I129 Hypertensive chronic kidney disease with stage 1 through stage 4 chronic kidney disease, or unspecified chronic kidney disease: Secondary | ICD-10-CM

## 2022-10-01 DIAGNOSIS — Z66 Do not resuscitate: Secondary | ICD-10-CM | POA: Diagnosis present

## 2022-10-01 DIAGNOSIS — S81802A Unspecified open wound, left lower leg, initial encounter: Secondary | ICD-10-CM | POA: Diagnosis not present

## 2022-10-01 DIAGNOSIS — M85862 Other specified disorders of bone density and structure, left lower leg: Secondary | ICD-10-CM | POA: Diagnosis not present

## 2022-10-01 DIAGNOSIS — I517 Cardiomegaly: Secondary | ICD-10-CM | POA: Diagnosis not present

## 2022-10-01 DIAGNOSIS — T380X5A Adverse effect of glucocorticoids and synthetic analogues, initial encounter: Secondary | ICD-10-CM | POA: Diagnosis present

## 2022-10-01 DIAGNOSIS — Z515 Encounter for palliative care: Secondary | ICD-10-CM

## 2022-10-01 DIAGNOSIS — Z79890 Hormone replacement therapy (postmenopausal): Secondary | ICD-10-CM

## 2022-10-01 DIAGNOSIS — Z7189 Other specified counseling: Secondary | ICD-10-CM | POA: Diagnosis not present

## 2022-10-01 DIAGNOSIS — R7982 Elevated C-reactive protein (CRP): Secondary | ICD-10-CM | POA: Diagnosis present

## 2022-10-01 DIAGNOSIS — J9601 Acute respiratory failure with hypoxia: Secondary | ICD-10-CM | POA: Diagnosis present

## 2022-10-01 DIAGNOSIS — Z743 Need for continuous supervision: Secondary | ICD-10-CM | POA: Diagnosis not present

## 2022-10-01 DIAGNOSIS — S91302A Unspecified open wound, left foot, initial encounter: Secondary | ICD-10-CM | POA: Diagnosis not present

## 2022-10-01 DIAGNOSIS — Z8739 Personal history of other diseases of the musculoskeletal system and connective tissue: Secondary | ICD-10-CM

## 2022-10-01 DIAGNOSIS — E1122 Type 2 diabetes mellitus with diabetic chronic kidney disease: Secondary | ICD-10-CM | POA: Diagnosis present

## 2022-10-01 DIAGNOSIS — I2510 Atherosclerotic heart disease of native coronary artery without angina pectoris: Secondary | ICD-10-CM | POA: Diagnosis present

## 2022-10-01 DIAGNOSIS — Z8249 Family history of ischemic heart disease and other diseases of the circulatory system: Secondary | ICD-10-CM

## 2022-10-01 DIAGNOSIS — R627 Adult failure to thrive: Secondary | ICD-10-CM | POA: Diagnosis present

## 2022-10-01 DIAGNOSIS — Z48813 Encounter for surgical aftercare following surgery on the respiratory system: Secondary | ICD-10-CM | POA: Diagnosis not present

## 2022-10-01 DIAGNOSIS — Z9841 Cataract extraction status, right eye: Secondary | ICD-10-CM

## 2022-10-01 DIAGNOSIS — J154 Pneumonia due to other streptococci: Secondary | ICD-10-CM | POA: Diagnosis present

## 2022-10-01 DIAGNOSIS — D631 Anemia in chronic kidney disease: Secondary | ICD-10-CM | POA: Diagnosis present

## 2022-10-01 DIAGNOSIS — J189 Pneumonia, unspecified organism: Principal | ICD-10-CM

## 2022-10-01 DIAGNOSIS — L03115 Cellulitis of right lower limb: Secondary | ICD-10-CM | POA: Diagnosis not present

## 2022-10-01 DIAGNOSIS — Z87891 Personal history of nicotine dependence: Secondary | ICD-10-CM | POA: Diagnosis not present

## 2022-10-01 DIAGNOSIS — E1165 Type 2 diabetes mellitus with hyperglycemia: Secondary | ICD-10-CM | POA: Diagnosis present

## 2022-10-01 DIAGNOSIS — R0989 Other specified symptoms and signs involving the circulatory and respiratory systems: Secondary | ICD-10-CM | POA: Diagnosis not present

## 2022-10-01 DIAGNOSIS — I38 Endocarditis, valve unspecified: Secondary | ICD-10-CM | POA: Diagnosis not present

## 2022-10-01 DIAGNOSIS — Z6823 Body mass index (BMI) 23.0-23.9, adult: Secondary | ICD-10-CM

## 2022-10-01 DIAGNOSIS — Z885 Allergy status to narcotic agent status: Secondary | ICD-10-CM

## 2022-10-01 DIAGNOSIS — L03119 Cellulitis of unspecified part of limb: Secondary | ICD-10-CM | POA: Diagnosis not present

## 2022-10-01 DIAGNOSIS — Z96642 Presence of left artificial hip joint: Secondary | ICD-10-CM | POA: Diagnosis present

## 2022-10-01 DIAGNOSIS — I10 Essential (primary) hypertension: Secondary | ICD-10-CM | POA: Diagnosis not present

## 2022-10-01 DIAGNOSIS — R9431 Abnormal electrocardiogram [ECG] [EKG]: Secondary | ICD-10-CM | POA: Diagnosis present

## 2022-10-01 DIAGNOSIS — Z7901 Long term (current) use of anticoagulants: Secondary | ICD-10-CM

## 2022-10-01 DIAGNOSIS — M79662 Pain in left lower leg: Secondary | ICD-10-CM | POA: Diagnosis not present

## 2022-10-01 DIAGNOSIS — M8580 Other specified disorders of bone density and structure, unspecified site: Secondary | ICD-10-CM | POA: Diagnosis present

## 2022-10-01 DIAGNOSIS — K219 Gastro-esophageal reflux disease without esophagitis: Secondary | ICD-10-CM | POA: Diagnosis present

## 2022-10-01 DIAGNOSIS — Z7984 Long term (current) use of oral hypoglycemic drugs: Secondary | ICD-10-CM

## 2022-10-01 DIAGNOSIS — R6889 Other general symptoms and signs: Secondary | ICD-10-CM | POA: Diagnosis not present

## 2022-10-01 DIAGNOSIS — R739 Hyperglycemia, unspecified: Secondary | ICD-10-CM | POA: Diagnosis not present

## 2022-10-01 DIAGNOSIS — L97529 Non-pressure chronic ulcer of other part of left foot with unspecified severity: Secondary | ICD-10-CM | POA: Diagnosis not present

## 2022-10-01 DIAGNOSIS — R54 Age-related physical debility: Secondary | ICD-10-CM | POA: Diagnosis present

## 2022-10-01 DIAGNOSIS — Z961 Presence of intraocular lens: Secondary | ICD-10-CM | POA: Diagnosis present

## 2022-10-01 DIAGNOSIS — M8618 Other acute osteomyelitis, other site: Secondary | ICD-10-CM | POA: Diagnosis not present

## 2022-10-01 DIAGNOSIS — R600 Localized edema: Secondary | ICD-10-CM | POA: Diagnosis not present

## 2022-10-01 DIAGNOSIS — A419 Sepsis, unspecified organism: Secondary | ICD-10-CM

## 2022-10-01 DIAGNOSIS — E782 Mixed hyperlipidemia: Secondary | ICD-10-CM | POA: Diagnosis present

## 2022-10-01 DIAGNOSIS — Z83438 Family history of other disorder of lipoprotein metabolism and other lipidemia: Secondary | ICD-10-CM

## 2022-10-01 DIAGNOSIS — M79673 Pain in unspecified foot: Secondary | ICD-10-CM | POA: Diagnosis not present

## 2022-10-01 DIAGNOSIS — R918 Other nonspecific abnormal finding of lung field: Secondary | ICD-10-CM | POA: Diagnosis not present

## 2022-10-01 DIAGNOSIS — Z7401 Bed confinement status: Secondary | ICD-10-CM | POA: Diagnosis not present

## 2022-10-01 DIAGNOSIS — E872 Acidosis, unspecified: Secondary | ICD-10-CM | POA: Diagnosis present

## 2022-10-01 DIAGNOSIS — Z833 Family history of diabetes mellitus: Secondary | ICD-10-CM

## 2022-10-01 DIAGNOSIS — Y92239 Unspecified place in hospital as the place of occurrence of the external cause: Secondary | ICD-10-CM | POA: Diagnosis present

## 2022-10-01 DIAGNOSIS — M79605 Pain in left leg: Secondary | ICD-10-CM | POA: Diagnosis not present

## 2022-10-01 DIAGNOSIS — Z79899 Other long term (current) drug therapy: Secondary | ICD-10-CM

## 2022-10-01 DIAGNOSIS — J188 Other pneumonia, unspecified organism: Secondary | ICD-10-CM | POA: Diagnosis present

## 2022-10-01 DIAGNOSIS — Z9842 Cataract extraction status, left eye: Secondary | ICD-10-CM

## 2022-10-01 LAB — CBC WITH DIFFERENTIAL/PLATELET
Abs Immature Granulocytes: 0.3 10*3/uL — ABNORMAL HIGH (ref 0.00–0.07)
Band Neutrophils: 14 %
Basophils Absolute: 0 10*3/uL (ref 0.0–0.1)
Basophils Relative: 0 %
Eosinophils Absolute: 0 10*3/uL (ref 0.0–0.5)
Eosinophils Relative: 0 %
HCT: 24.3 % — ABNORMAL LOW (ref 36.0–46.0)
Hemoglobin: 7.5 g/dL — ABNORMAL LOW (ref 12.0–15.0)
Lymphocytes Relative: 4 %
Lymphs Abs: 1.2 10*3/uL (ref 0.7–4.0)
MCH: 25.8 pg — ABNORMAL LOW (ref 26.0–34.0)
MCHC: 30.9 g/dL (ref 30.0–36.0)
MCV: 83.5 fL (ref 80.0–100.0)
Metamyelocytes Relative: 1 %
Monocytes Absolute: 0.3 10*3/uL (ref 0.1–1.0)
Monocytes Relative: 1 %
Neutro Abs: 27.8 10*3/uL — ABNORMAL HIGH (ref 1.7–7.7)
Neutrophils Relative %: 80 %
Platelets: 170 10*3/uL (ref 150–400)
RBC: 2.91 MIL/uL — ABNORMAL LOW (ref 3.87–5.11)
RDW: 17.6 % — ABNORMAL HIGH (ref 11.5–15.5)
WBC: 29.6 10*3/uL — ABNORMAL HIGH (ref 4.0–10.5)
nRBC: 0 % (ref 0.0–0.2)

## 2022-10-01 LAB — COMPREHENSIVE METABOLIC PANEL
ALT: 13 U/L (ref 0–44)
AST: 18 U/L (ref 15–41)
Albumin: 2.4 g/dL — ABNORMAL LOW (ref 3.5–5.0)
Alkaline Phosphatase: 132 U/L — ABNORMAL HIGH (ref 38–126)
Anion gap: 12 (ref 5–15)
BUN: 44 mg/dL — ABNORMAL HIGH (ref 8–23)
CO2: 20 mmol/L — ABNORMAL LOW (ref 22–32)
Calcium: 8.8 mg/dL — ABNORMAL LOW (ref 8.9–10.3)
Chloride: 103 mmol/L (ref 98–111)
Creatinine, Ser: 2.82 mg/dL — ABNORMAL HIGH (ref 0.44–1.00)
GFR, Estimated: 16 mL/min — ABNORMAL LOW (ref >60)
Glucose, Bld: 275 mg/dL — ABNORMAL HIGH (ref 70–99)
Potassium: 4.8 mmol/L (ref 3.5–5.1)
Sodium: 135 mmol/L (ref 135–145)
Total Bilirubin: 0.5 mg/dL (ref 0.3–1.2)
Total Protein: 6.4 g/dL — ABNORMAL LOW (ref 6.5–8.1)

## 2022-10-01 LAB — PROTIME-INR
INR: 1.7 — ABNORMAL HIGH (ref 0.8–1.2)
Prothrombin Time: 19.8 seconds — ABNORMAL HIGH (ref 11.4–15.2)

## 2022-10-01 LAB — LACTIC ACID, PLASMA
Lactic Acid, Venous: 2.3 mmol/L (ref 0.5–1.9)
Lactic Acid, Venous: 2 mmol/L (ref 0.5–1.9)

## 2022-10-01 LAB — CULTURE, BLOOD (ROUTINE X 2)
Special Requests: ADEQUATE
Special Requests: ADEQUATE

## 2022-10-01 LAB — RESP PANEL BY RT-PCR (RSV, FLU A&B, COVID)  RVPGX2
Influenza A by PCR: NEGATIVE
Influenza B by PCR: NEGATIVE
Resp Syncytial Virus by PCR: NEGATIVE
SARS Coronavirus 2 by RT PCR: NEGATIVE

## 2022-10-01 LAB — GLUCOSE, CAPILLARY: Glucose-Capillary: 230 mg/dL — ABNORMAL HIGH (ref 70–99)

## 2022-10-01 LAB — APTT: aPTT: 51 seconds — ABNORMAL HIGH (ref 24–36)

## 2022-10-01 MED ORDER — SODIUM CHLORIDE 0.9 % IV BOLUS
500.0000 mL | Freq: Once | INTRAVENOUS | Status: AC
  Administered 2022-10-01: 500 mL via INTRAVENOUS

## 2022-10-01 MED ORDER — VANCOMYCIN VARIABLE DOSE PER UNSTABLE RENAL FUNCTION (PHARMACIST DOSING): Status: DC

## 2022-10-01 MED ORDER — INSULIN ASPART 100 UNIT/ML IJ SOLN
0.0000 [IU] | Freq: Three times a day (TID) | INTRAMUSCULAR | Status: DC
  Administered 2022-10-03: 2 [IU] via SUBCUTANEOUS
  Administered 2022-10-03: 1 [IU] via SUBCUTANEOUS
  Administered 2022-10-04 (×2): 2 [IU] via SUBCUTANEOUS
  Administered 2022-10-04: 3 [IU] via SUBCUTANEOUS

## 2022-10-01 MED ORDER — SODIUM CHLORIDE 0.9 % IV SOLN: INTRAVENOUS | Status: AC

## 2022-10-01 MED ORDER — VANCOMYCIN HCL IN DEXTROSE 1-5 GM/200ML-% IV SOLN
1000.0000 mg | Freq: Once | INTRAVENOUS | Status: AC
  Administered 2022-10-01: 1000 mg via INTRAVENOUS
  Filled 2022-10-01: qty 200

## 2022-10-01 MED ORDER — SODIUM CHLORIDE 0.9 % IV SOLN
2.0000 g | Freq: Once | INTRAVENOUS | Status: AC
  Administered 2022-10-01: 2 g via INTRAVENOUS
  Filled 2022-10-01: qty 12.5

## 2022-10-01 MED ORDER — SODIUM CHLORIDE 0.9 % IV BOLUS (SEPSIS)
500.0000 mL | Freq: Once | INTRAVENOUS | Status: AC
  Administered 2022-10-01: 500 mL via INTRAVENOUS

## 2022-10-01 MED ORDER — LEVOTHYROXINE SODIUM 75 MCG PO TABS
37.5000 ug | ORAL_TABLET | Freq: Every day | ORAL | Status: DC
  Administered 2022-10-03 – 2022-10-04 (×2): 37.5 ug via ORAL
  Filled 2022-10-01 (×3): qty 1

## 2022-10-01 MED ORDER — ACETAMINOPHEN 650 MG RE SUPP: 650.0000 mg | Freq: Four times a day (QID) | RECTAL | Status: DC | PRN

## 2022-10-01 MED ORDER — SODIUM CHLORIDE 0.9 % IV SOLN
100.0000 mg | Freq: Two times a day (BID) | INTRAVENOUS | Status: DC
  Administered 2022-10-01 – 2022-10-02 (×2): 100 mg via INTRAVENOUS
  Filled 2022-10-01 (×5): qty 100

## 2022-10-01 MED ORDER — ACETAMINOPHEN 325 MG PO TABS
650.0000 mg | ORAL_TABLET | Freq: Four times a day (QID) | ORAL | Status: DC | PRN
  Administered 2022-10-08 – 2022-10-09 (×2): 650 mg via ORAL
  Filled 2022-10-01 (×4): qty 2

## 2022-10-01 MED ORDER — VANCOMYCIN HCL IN DEXTROSE 1-5 GM/200ML-% IV SOLN: 1000.0000 mg | Freq: Once | INTRAVENOUS | Status: DC

## 2022-10-01 MED ORDER — POLYETHYLENE GLYCOL 3350 17 G PO PACK: 17.0000 g | PACK | Freq: Every day | ORAL | Status: DC | PRN

## 2022-10-01 MED ORDER — HEPARIN SODIUM (PORCINE) 5000 UNIT/ML IJ SOLN
5000.0000 [IU] | Freq: Three times a day (TID) | INTRAMUSCULAR | Status: DC
  Administered 2022-10-01 – 2022-10-09 (×23): 5000 [IU] via SUBCUTANEOUS
  Filled 2022-10-01 (×23): qty 1

## 2022-10-01 MED ORDER — INSULIN ASPART 100 UNIT/ML IJ SOLN
0.0000 [IU] | Freq: Every day | INTRAMUSCULAR | Status: DC
  Administered 2022-10-01: 2 [IU] via SUBCUTANEOUS
  Administered 2022-10-04: 3 [IU] via SUBCUTANEOUS

## 2022-10-01 MED ORDER — SODIUM CHLORIDE 0.9 % IV SOLN: 2.0000 g | INTRAVENOUS | Status: DC

## 2022-10-01 MED ORDER — SODIUM CHLORIDE 0.9 % IV SOLN: 2.0000 g | Freq: Once | INTRAVENOUS | Status: DC

## 2022-10-01 MED ORDER — SODIUM CHLORIDE 0.9 % IV SOLN: INTRAVENOUS | Status: DC

## 2022-10-01 MED ORDER — METRONIDAZOLE 500 MG/100ML IV SOLN
500.0000 mg | Freq: Once | INTRAVENOUS | Status: AC
  Administered 2022-10-01: 500 mg via INTRAVENOUS
  Filled 2022-10-01: qty 100

## 2022-10-01 NOTE — Assessment & Plan Note (Signed)
Stable.  Nursing home resident.  Baseline most times able to recognize family, answer simple questions, significant short-term memory loss.

## 2022-10-01 NOTE — ED Triage Notes (Signed)
Pt BIB [facility] from [facility] for left foot pain; staff told ems pt has a wound to left foot and pt states she has been unable to walk on the foot  Pt O2 sats with ems were 93% but upon arrival pt found to have O2 sats in mid 80's; O2 applied at 3L and sats increased to 94%  Pt very sleepy during triage

## 2022-10-01 NOTE — Assessment & Plan Note (Signed)
No peripheral signs of volume overload.  CT showing pleural effusions.

## 2022-10-01 NOTE — ED Notes (Signed)
ED TO INPATIENT HANDOFF REPORT  ED Nurse Name and Phone #:   S Name/Age/Gender Marie Hunt 89 y.o. female Room/Bed: APA06/APA06  Code Status   Code Status: Prior  Home/SNF/Other Nursing Home Patient oriented to: self, place, and situation Is this baseline? Yes   Triage Complete: Triage complete  Chief Complaint Multifocal pneumonia [J18.9]  Triage Note Pt BIB [facility] from [facility] for left foot pain; staff told ems pt has a wound to left foot and pt states she has been unable to walk on the foot  Pt O2 sats with ems were 93% but upon arrival pt found to have O2 sats in mid 80's; O2 applied at 3L and sats increased to 94%  Pt very sleepy during triage   Allergies Allergies  Allergen Reactions   Codeine Nausea And Vomiting    Level of Care/Admitting Diagnosis ED Disposition     ED Disposition  Admit   Condition  --   Comment  Hospital Area: [facility] [[id number]]  Level of Care: Telemetry [5]  Covid Evaluation: Asymptomatic - no recent exposure (last 10 days) testing not required  Diagnosis: Multifocal pneumonia [[id number]]  Admitting Physician: Onnie Boer [[id number]]  Attending Physician: Onnie Boer [id number]  Certification:: I certify this patient will need inpatient services for at least 2 midnights  Estimated Length of Stay: 2          B Medical/Surgery History Past Medical History:  Diagnosis Date   Anemia    Carotid artery disease (HCC)    1-39% RICA, patent LICA 7/12 - Dr. Edilia Bo   Cholelithiasis    Cholelithiasis    In need of cholecystectomy   Diabetes mellitus    Type II   Essential hypertension, benign    GERD (gastroesophageal reflux disease)    GERD (gastroesophageal reflux disease)    Mixed hyperlipidemia    Sigmoid diverticulosis    Sigmoid diverticulosis    Type 2 diabetes mellitus (HCC)    Past Surgical History:  Procedure Laterality Date   CAROTID ENDARTERECTOMY  08/24/2009   Left catroid  endarterectomy   CATARACT EXTRACTION W/PHACO Left 08/17/2014   Procedure: CATARACT EXTRACTION PHACO AND INTRAOCULAR LENS PLACEMENT; CDE:  7.39;  Surgeon: Gemma Payor, MD;  Location: [facility];  Service: Ophthalmology;  Laterality: Left;   CATARACT EXTRACTION W/PHACO Right 10/02/2014   Procedure: CATARACT EXTRACTION PHACO AND INTRAOCULAR LENS PLACEMENT RIGHT EYE CDE=5.83;  Surgeon: Gemma Payor, MD;  Location: [facility];  Service: Ophthalmology;  Laterality: Right;   CYSTOSCOPY W/ RETROGRADES Bilateral 03/28/2019   Procedure: CYSTOSCOPY WITH BILATRAL RETROGRADE PYELOGRAM,;  Surgeon: Malen Gauze, MD;  Location: [facility];  Service: Urology;  Laterality: Bilateral;   EYE SURGERY     HIP ARTHROPLASTY Left 10/03/2014   Procedure: ARTHROPLASTY BIPOLAR HIP (HEMIARTHROPLASTY);  Surgeon: Darreld Mclean, MD;  Location: [facility];  Service: Orthopedics;  Laterality: Left;   Left carotid endarterectomy  7/11   Dr. Edilia Bo   TRANSURETHRAL RESECTION OF BLADDER TUMOR N/A 03/28/2019   Procedure: TRANSURETHRAL RESECTION OF BLADDER TUMOR (TURBT);  Surgeon: Malen Gauze, MD;  Location: [facility];  Service: Urology;  Laterality: N/A;   URETEROSCOPY Left 03/28/2019   Procedure: DIAGNOSTIC URETEROSCOPY WITH LEFT URETERAL STENT PLACEMENT;  Surgeon: Malen Gauze, MD;  Location: [facility];  Service: Urology;  Laterality: Left;     A IV Location/Drains/Wounds Patient Lines/Drains/Airways Status     Active Line/Drains/Airways     Name Placement date Placement time Site Days  Peripheral IV 10/01/22 22 G Anterior;Distal;Right Forearm 10/01/22  1851  Forearm  less than 1   Ureteral Drain/Stent Left ureter 6 Fr. 03/28/19  0859  Left ureter  1283   Pressure Injury 08/12/22 Buttocks Left Stage 2 -  Partial thickness loss of dermis presenting as a shallow open injury with a red, pink wound bed without slough. 08/12/22  0051  -- 50            Intake/Output Last 24 hours No intake or output data in the 24 hours ending  10/01/22 2020  Labs/Imaging Results for orders placed or performed during the hospital encounter of 10/01/22 (from the past 48 hour(s))  Culture, blood (Routine X 2) w Reflex to ID Panel     Status: None (Preliminary result)   Collection Time: 10/01/22  4:42 PM   Specimen: Right Antecubital; Blood  Result Value Ref Range   Specimen Description RIGHT ANTECUBITAL    Special Requests      BOTTLES DRAWN AEROBIC AND ANAEROBIC Blood Culture adequate volume Performed at [facility], [address], [place], [place] [address]    Culture PENDING    Report Status PENDING   CBC with Differential/Platelet     Status: Abnormal   Collection Time: 10/01/22  4:42 PM  Result Value Ref Range   WBC 29.6 (H) 4.0 - 10.5 K/uL   RBC 2.91 (L) 3.87 - 5.11 MIL/uL   Hemoglobin 7.5 (L) 12.0 - 15.0 g/dL   HCT 01.0 (L) 27.2 - 53.6 %   MCV 83.5 80.0 - 100.0 fL   MCH 25.8 (L) 26.0 - 34.0 pg   MCHC 30.9 30.0 - 36.0 g/dL   RDW 64.4 (H) 03.4 - 74.2 %   Platelets 170 150 - 400 K/uL   nRBC 0.0 0.0 - 0.2 %   Neutrophils Relative % 80 %   Neutro Abs 27.8 (H) 1.7 - 7.7 K/uL   Band Neutrophils 14 %   Lymphocytes Relative 4 %   Lymphs Abs 1.2 0.7 - 4.0 K/uL   Monocytes Relative 1 %   Monocytes Absolute 0.3 0.1 - 1.0 K/uL   Eosinophils Relative 0 %   Eosinophils Absolute 0.0 0.0 - 0.5 K/uL   Basophils Relative 0 %   Basophils Absolute 0.0 0.0 - 0.1 K/uL   WBC Morphology Mild Left Shift (1-5% metas, occ myelo)    RBC Morphology MORPHOLOGY UNREMARKABLE    Smear Review MORPHOLOGY UNREMARKABLE    Metamyelocytes Relative 1 %   Abs Immature Granulocytes 0.30 (H) 0.00 - 0.07 K/uL    Comment: Performed at [facility], [address] [place], [place] [address]  Comprehensive metabolic panel     Status: Abnormal   Collection Time: 10/01/22  4:42 PM  Result Value Ref Range   Sodium 135 135 - 145 mmol/L   Potassium 4.8 3.5 - 5.1 mmol/L   Chloride 103 98 - 111 mmol/L   CO2 20 (L) 22 - 32 mmol/L   Glucose, Bld 275 (H)  70 - 99 mg/dL    Comment: Glucose reference range applies only to samples taken after fasting for at least 8 hours.   BUN 44 (H) 8 - 23 mg/dL   Creatinine, Ser 8.75 (H) 0.44 - 1.00 mg/dL   Calcium 8.8 (L) 8.9 - 10.3 mg/dL   Total Protein 6.4 (L) 6.5 - 8.1 g/dL   Albumin 2.4 (L) 3.5 - 5.0 g/dL   AST 18 15 - 41 U/L   ALT 13 0 - 44 U/L  Alkaline Phosphatase 132 (H) 38 - 126 U/L   Total Bilirubin 0.5 0.3 - 1.2 mg/dL   GFR, Estimated 16 (L) >60 mL/min    Comment: (NOTE) Calculated using the CKD-EPI Creatinine Equation (2021)    Anion gap 12 5 - 15    Comment: Performed at [facility], [address], [place], [place] [address]  Lactic acid, plasma     Status: Abnormal   Collection Time: 10/01/22  4:42 PM  Result Value Ref Range   Lactic Acid, Venous 2.3 (HH) 0.5 - 1.9 mmol/L    Comment: CRITICAL RESULT CALLED TO, READ BACK BY AND VERIFIED WITH FLETCHER,A ON 10/01/22 AT 1740 BY LOY,C Performed at [facility], [address], [place], [place] [address]   Protime-INR     Status: Abnormal   Collection Time: 10/01/22  4:42 PM  Result Value Ref Range   Prothrombin Time 19.8 (H) 11.4 - 15.2 seconds   INR 1.7 (H) 0.8 - 1.2    Comment: (NOTE) INR goal varies based on device and disease states. Performed at [facility], [address], [place], [place] [address]   APTT     Status: Abnormal   Collection Time: 10/01/22  4:42 PM  Result Value Ref Range   aPTT 51 (H) 24 - 36 seconds    Comment:        IF BASELINE aPTT IS ELEVATED, SUGGEST PATIENT RISK ASSESSMENT BE USED TO DETERMINE APPROPRIATE ANTICOAGULANT THERAPY. Performed at [facility], [address], [place], [place] [address]   Culture, blood (Routine X 2) w Reflex to ID Panel     Status: None (Preliminary result)   Collection Time: 10/01/22  4:58 PM   Specimen: Left Antecubital; Blood  Result Value Ref Range   Specimen Description LEFT ANTECUBITAL    Special Requests      BOTTLES DRAWN AEROBIC AND ANAEROBIC Blood Culture  adequate volume Performed at [facility], [address] [place], [place] [address]    Culture PENDING    Report Status PENDING   Lactic acid, plasma     Status: Abnormal   Collection Time: 10/01/22  6:37 PM  Result Value Ref Range   Lactic Acid, Venous 2.0 (HH) 0.5 - 1.9 mmol/L    Comment: CRITICAL VALUE NOTED.  VALUE IS CONSISTENT WITH PREVIOUSLY REPORTED AND CALLED VALUE. Performed at [facility], [address], [place], [place] [address]    CT Chest Wo Contrast  Result Date: 10/01/2022 CLINICAL DATA:  Pneumonia, complication suspected, xray done. Abnormal x-ray EXAM: CT CHEST WITHOUT CONTRAST TECHNIQUE: Multidetector CT imaging of the chest was performed following the standard protocol without IV contrast. RADIATION DOSE REDUCTION: This exam was performed according to the departmental dose-optimization program which includes automated exposure control, adjustment of the mA and/or kV according to patient size and/or use of iterative reconstruction technique. COMPARISON:  X-ray today. FINDINGS: Cardiovascular: Cardiomegaly. Diffuse coronary artery and aortic atherosclerosis. No aneurysm. Mediastinum/Nodes: No mediastinal, hilar, or axillary adenopathy. Trachea and esophagus are unremarkable. Thyroid unremarkable. Lungs/Pleura: Small left pleural effusion and moderate right pleural effusion. Bilateral lower lobe airspace opacities dependently, favor atelectasis although is difficult to exclude pneumonia. Ground-glass airspace opacity posteriorly in the right upper lobe also could reflect pneumonia. Upper Abdomen: No acute findings Musculoskeletal: Chest wall soft tissues are unremarkable. No acute bony abnormality. IMPRESSION: Cardiomegaly, diffuse coronary artery disease. Moderate right pleural effusion and small left pleural effusion. Bilateral lower lobe airspace opacities, mostly dependent. Favor atelectasis although pneumonia cannot be excluded. Ground-glass airspace  disease posteriorly in the  right upper lobe more concerning for pneumonia. Aortic Atherosclerosis (ICD10-I70.0). Electronically Signed   By: Charlett Nose M.D.   On: 10/01/2022 18:25   US Venous Img Lower  Left (DVT Study)  Result Date: 10/01/2022 CLINICAL DATA:  Swelling and pain. EXAM: LEFT LOWER EXTREMITY VENOUS DOPPLER ULTRASOUND TECHNIQUE: Gray-scale sonography with compression, as well as color and duplex ultrasound, were performed to evaluate the deep venous system(s) from the level of the common femoral vein through the popliteal and proximal calf veins. COMPARISON:  Left lower extremity venous duplex ultrasound 08/11/2022. FINDINGS: VENOUS Normal compressibility of the common femoral, superficial femoral, and popliteal veins, as well as the visualized calf veins. Visualized portions of profunda femoral vein and great saphenous vein unremarkable. No filling defects to suggest DVT on grayscale or color Doppler imaging. Doppler waveforms show normal direction of venous flow, normal respiratory plasticity and response to augmentation. Limited views of the contralateral common femoral vein are unremarkable. OTHER Left calf edema. Limitations: none IMPRESSION: Negative. Electronically Signed   By: Orvan Falconer M.D.   On: 10/01/2022 16:57   DG Tibia/Fibula Left  Result Date: 10/01/2022 CLINICAL DATA:  Pain and swelling.  Wound. EXAM: LEFT TIBIA AND FIBULA - 2 VIEW COMPARISON:  None Available. FINDINGS: Severe osteopenia. Soft tissue swelling diffusely greatest towards the ankle. Diffuse vascular calcifications. No definite erosive changes. If there is further concern of bone infection, additional workup as clinically directed for further sensitivity such as bone scan or MRI IMPRESSION: Soft tissue swelling.  Severe osteopenia. Electronically Signed   By: Karen Kays M.D.   On: 10/01/2022 16:23   DG Foot Complete Left  Result Date: 10/01/2022 CLINICAL DATA:  Wound to left foot.  Swelling and pain EXAM: LEFT FOOT - COMPLETE 3 VIEW  COMPARISON:  None Available. FINDINGS: Severe osteopenia. No acute fracture or dislocation. Mild degenerative changes of the dorsal aspect of the midfoot. Calcaneal spurs are well corticated. Scattered vascular calcifications. No definite erosive changes. Diffuse soft tissue swelling. However if there is further concern of bone infection, MRI or bone scan may be useful for much higher sensitivity. IMPRESSION: Severe osteopenia with soft tissue swelling and degenerative changes. Electronically Signed   By: Karen Kays M.D.   On: 10/01/2022 16:22   DG Chest Port 1 View  Result Date: 10/01/2022 CLINICAL DATA:  Hypoxia. EXAM: PORTABLE CHEST 1 VIEW COMPARISON:  August 11, 2022. FINDINGS: Mild cardiomegaly with central pulmonary vascular congestion. Stable interstitial densities are noted throughout both lungs which may represent scarring. Right basilar atelectasis or infiltrate is noted with small right pleural effusion. Bony thorax is unremarkable. IMPRESSION: Mild right basilar atelectasis or infiltrate is noted with small right pleural effusion. Mild cardiomegaly with central pulmonary vascular congestion. Stable chronic interstitial densities are noted bilaterally which may represent scarring. Electronically Signed   By: Lupita Raider M.D.   On: 10/01/2022 15:52    Pending Labs Unresulted Labs (From admission, onward)     Start     Ordered   10/01/22 2047  Lactic acid, plasma  Once,   STAT        10/01/22 2047   10/01/22 1746  Resp panel by RT-PCR (RSV, Flu A&B, Covid) Anterior Nasal Swab  (Septic presentation on arrival (screening labs, nursing and treatment orders for obvious sepsis))  Once,   URGENT        10/01/22 1746   10/01/22 1746  Lactic acid, plasma  (Septic presentation on arrival (screening labs,  nursing and treatment orders for obvious sepsis))  STAT Now then every 2 hours,   R      10/01/22 1746   10/01/22 1745  Urinalysis, Routine w reflex microscopic -Urine, Clean Catch  Once,   URGENT        Question:  Specimen Source  Answer:  Urine, Clean Catch   10/01/22 1744            Vitals/Pain Today's Vitals   10/01/22 1700 10/01/22 1730 10/01/22 1745 10/01/22 1934  BP: (!) 104/55 (!) 101/53 (!) 109/58 (!) 101/49  Pulse: 66 69 71 80  Resp:   18 17  Temp:    97.7 F (36.5 C)  TempSrc:    Oral  SpO2: 99% 99% 100% 100%  Weight:   55 kg     Isolation Precautions No active isolations  Medications Medications  0.9 %  sodium chloride infusion (has no administration in time range)  sodium chloride 0.9 % bolus 500 mL (500 mLs Intravenous New Bag/Given 10/01/22 1925)  metroNIDAZOLE (FLAGYL) IVPB 500 mg (has no administration in time range)  vancomycin (VANCOCIN) IVPB 1000 mg/200 mL premix (has no administration in time range)  ceFEPIme (MAXIPIME) 2 g in sodium chloride 0.9 % 100 mL IVPB (has no administration in time range)  vancomycin variable dose per unstable renal function (pharmacist dosing) (has no administration in time range)  sodium chloride 0.9 % bolus 500 mL (500 mLs Intravenous New Bag/Given 10/01/22 1925)  ceFEPIme (MAXIPIME) 2 g in sodium chloride 0.9 % 100 mL IVPB (2 g Intravenous New Bag/Given 10/01/22 1924)    Mobility walks with device     Focused Assessments Pulmonary Assessment Handoff:  Lung sounds:   O2 Device: Nasal Cannula O2 Flow Rate (L/min): 2 L/min    R Recommendations: See Admitting Provider Note  Report given to:   Additional Notes:

## 2022-10-01 NOTE — ED Provider Notes (Signed)
[place] EMERGENCY DEPARTMENT AT [facility] Provider Note   CSN: [id number] Arrival date & time: 10/01/22  1419     History  Chief Complaint  Patient presents with   Foot Pain    Marie Hunt is a 89 y.o. female.  Patient sent in from [facility] for left foot pain and swelling.  Staff told EMS patient has a wound to the left foot and patient states that she been able to walk on the foot.  According to family members the swelling is unchanged from before.  Looking at the wound the wound does not look very impressive.  Patient's O2 sats by EMS were 93% but upon arrival here were mid 80s and they put her on 3 L of oxygen.  Sats increased to 94%.  Patient's initial blood pressure was a little soft at 96/48 temperature was 97.7.  Respiratory rate was 16.  Past medical history significant gastroesophageal reflux disease hypertension type 2 diabetes hyperlipidemia history of sigmoid diverticulosis cholelithiasis patient had carotid endarterectomy done left hip arthroplasty.  Patient has not had gallstones removed.  Patient is a former smoker quit in 2013       Home Medications Prior to Admission medications   Medication Sig Start Date End Date Taking? Authorizing Provider  Amino Acids-Protein Hydrolys (PRO-STAT 64 PO) Take 30 mLs by mouth every morning.   Yes [provider]  ELIQUIS 2.5 MG TABS tablet Take 2.5 mg by mouth 2 (two) times daily. 09/03/22  Yes [provider]  famotidine (PEPCID) 20 MG tablet Take 20 mg by mouth at bedtime.    Yes [provider]  ferrous sulfate 325 (65 FE) MG tablet Take 325 mg by mouth every morning.   Yes [provider]  furosemide (LASIX) 40 MG tablet Take 1 tablet (40 mg total) by mouth daily. 07/08/22  Yes Osvaldo Shipper, MD  hydrALAZINE (APRESOLINE) 25 MG tablet Take 1 tablet (25 mg total) by mouth every 8 (eight) hours. Patient taking differently: Take 25 mg by mouth 2 (two) times daily. 07/08/22   Yes Osvaldo Shipper, MD  isosorbide mononitrate (IMDUR) 30 MG 24 hr tablet Take 0.5 tablets (15 mg total) by mouth daily. 07/09/22  Yes Osvaldo Shipper, MD  levothyroxine (SYNTHROID) 75 MCG tablet Take 0.5 tablets (37.5 mcg total) by mouth daily at 6 (six) AM. 07/09/22  Yes Osvaldo Shipper, MD  linagliptin (TRADJENTA) 5 MG TABS tablet Take 1 tablet (5 mg total) by mouth daily. 08/13/22  Yes Johnson, Clanford L, MD  melatonin 5 MG TABS Take 5 mg by mouth at bedtime.   Yes [provider]  metoprolol succinate (TOPROL-XL) 25 MG 24 hr tablet Take 0.5 tablets (12.5 mg total) by mouth daily. 07/09/22  Yes Osvaldo Shipper, MD  Multiple Vitamin (MULTIVITAMIN WITH MINERALS) TABS tablet Take 1 tablet by mouth daily. 08/14/22  Yes Johnson, Maylene Roes, MD  Podiatric Products (EUCERIN ADVANCED REPAIR FOOT) CREA Apply 1 application  topically 2 (two) times daily. Apply to bilateral heels topically two times a day for hydration of skin prior to placing and after removing of compression hose.   Yes [provider]  simvastatin (ZOCOR) 20 MG tablet Take 20 mg by mouth at bedtime.   Yes [provider]  traZODone (DESYREL) 50 MG tablet Take 1 tablet (50 mg total) by mouth at bedtime. 08/13/22  Yes Johnson, Clanford L, MD  Zinc 220 (50 Zn) MG CAPS Take 1 capsule by mouth every morning.   Yes  [provider]  acetaminophen (TYLENOL) 325 MG tablet Take 2 tablets (650 mg total) by mouth every 6 (six) hours as needed for mild pain, headache or fever (or Fever >/= 101). 08/13/22   Johnson, Clanford L, MD  allopurinol (ZYLOPRIM) 100 MG tablet Take 100 mg by mouth every morning.     [provider]  aspirin EC 81 MG tablet Take 81 mg by mouth every evening.     [provider]  diclofenac Sodium (VOLTAREN) 1 % GEL Apply 2 g topically daily as needed (for pain). 03/25/19   [provider]  diphenhydrAMINE (BENADRYL) 2 % cream Apply 1 application  topically 3 (three) times  daily as needed for itching (rash).    [provider]  feeding supplement, GLUCERNA SHAKE, (GLUCERNA SHAKE) LIQD Take 237 mLs by mouth 3 (three) times daily between meals. 08/13/22   Johnson, Clanford L, MD  folic acid (FOLVITE) 400 MCG tablet Take 400 mcg by mouth every morning.     [provider]  Global Inject Ease Lancets 30G MISC See admin instructions. 06/23/19   [provider]  Glycerin-Hypromellose-PEG 400 (DRY EYE RELIEF DROPS) 0.2-0.2-1 % SOLN Apply 1 drop to eye daily as needed (dry eyes).    [provider]  Melatonin 10 MG TABS Take 1 tablet by mouth at bedtime. 08/13/22   Cleora Fleet, MD  [facility] VERIO test strip 1 each daily. 06/23/19   [provider]      Allergies    Codeine    Review of Systems   Review of Systems  Constitutional:  Negative for chills and fever.  HENT:  Negative for ear pain and sore throat.   Eyes:  Negative for pain and visual disturbance.  Respiratory:  Negative for cough and shortness of breath.   Cardiovascular:  Positive for leg swelling. Negative for chest pain and palpitations.  Gastrointestinal:  Negative for abdominal pain and vomiting.  Genitourinary:  Negative for dysuria and hematuria.  Musculoskeletal:  Negative for arthralgias and back pain.  Skin:  Negative for color change and rash.  Neurological:  Negative for seizures and syncope.  All other systems reviewed and are negative.   Physical Exam Updated Vital Signs BP (!) 109/58   Pulse 71   Temp 97.7 F (36.5 C) (Oral)   Resp 18   Wt 55 kg   SpO2 100%   BMI 20.81 kg/m  Physical Exam Vitals and nursing note reviewed.  Constitutional:      General: She is not in acute distress.    Appearance: Normal appearance. She is well-developed. She is not ill-appearing.  HENT:     Head: Normocephalic and atraumatic.  Eyes:     Conjunctiva/sclera: Conjunctivae normal.  Cardiovascular:     Rate and Rhythm: Normal rate and regular  rhythm.     Heart sounds: No murmur heard. Pulmonary:     Effort: Pulmonary effort is normal. No respiratory distress.     Breath sounds: Normal breath sounds. No wheezing, rhonchi or rales.  Abdominal:     Palpations: Abdomen is soft.     Tenderness: There is no abdominal tenderness.  Musculoskeletal:        General: No swelling.     Cervical back: Normal range of motion and neck supple.     Left lower leg: Edema present.     Comments: Patient with marked swelling to the left leg but no real erythema.  Patient's wound to the lateral aspect of her  left foot does not appear to be purulent not deep.  Somewhat superficial.  Patient is tender to palpation to the foot into the leg.  Cap refill is present to both feet less than 2 seconds or approximately 2 seconds.  Skin:    General: Skin is warm and dry.     Capillary Refill: Capillary refill takes less than 2 seconds.  Neurological:     Mental Status: She is alert. Mental status is at baseline.  Psychiatric:        Mood and Affect: Mood normal.     ED Results / Procedures / Treatments   Labs (all labs ordered are listed, but only abnormal results are displayed) Labs Reviewed  CBC WITH DIFFERENTIAL/PLATELET - Abnormal; Notable for the following components:      Result Value   WBC 29.6 (*)    RBC 2.91 (*)    Hemoglobin 7.5 (*)    HCT 24.3 (*)    MCH 25.8 (*)    RDW 17.6 (*)    Neutro Abs 27.8 (*)    Abs Immature Granulocytes 0.30 (*)    All other components within normal limits  COMPREHENSIVE METABOLIC PANEL - Abnormal; Notable for the following components:   CO2 20 (*)    Glucose, Bld 275 (*)    BUN 44 (*)    Creatinine, Ser 2.82 (*)    Calcium 8.8 (*)    Total Protein 6.4 (*)    Albumin 2.4 (*)    Alkaline Phosphatase 132 (*)    GFR, Estimated 16 (*)    All other components within normal limits  LACTIC ACID, PLASMA - Abnormal; Notable for the following components:   Lactic Acid, Venous 2.3 (*)    All other components  within normal limits  PROTIME-INR - Abnormal; Notable for the following components:   Prothrombin Time 19.8 (*)    INR 1.7 (*)    All other components within normal limits  APTT - Abnormal; Notable for the following components:   aPTT 51 (*)    All other components within normal limits  CULTURE, BLOOD (ROUTINE X 2)  CULTURE, BLOOD (ROUTINE X 2)  RESP PANEL BY RT-PCR (RSV, FLU A&B, COVID)  [id number]  URINALYSIS, ROUTINE W REFLEX MICROSCOPIC  LACTIC ACID, PLASMA  LACTIC ACID, PLASMA  LACTIC ACID, PLASMA    EKG None  Radiology CT Chest Wo Contrast  Result Date: 10/01/2022 CLINICAL DATA:  Pneumonia, complication suspected, xray done. Abnormal x-ray EXAM: CT CHEST WITHOUT CONTRAST TECHNIQUE: Multidetector CT imaging of the chest was performed following the standard protocol without IV contrast. RADIATION DOSE REDUCTION: This exam was performed according to the departmental dose-optimization program which includes automated exposure control, adjustment of the mA and/or kV according to patient size and/or use of iterative reconstruction technique. COMPARISON:  X-ray today. FINDINGS: Cardiovascular: Cardiomegaly. Diffuse coronary artery and aortic atherosclerosis. No aneurysm. Mediastinum/Nodes: No mediastinal, hilar, or axillary adenopathy. Trachea and esophagus are unremarkable. Thyroid unremarkable. Lungs/Pleura: Small left pleural effusion and moderate right pleural effusion. Bilateral lower lobe airspace opacities dependently, favor atelectasis although is difficult to exclude pneumonia. Ground-glass airspace opacity posteriorly in the right upper lobe also could reflect pneumonia. Upper Abdomen: No acute findings Musculoskeletal: Chest wall soft tissues are unremarkable. No acute bony abnormality. IMPRESSION: Cardiomegaly, diffuse coronary artery disease. Moderate right pleural effusion and small left pleural effusion. Bilateral lower lobe airspace opacities, mostly dependent. Favor atelectasis  although pneumonia cannot be excluded. Ground-glass airspace disease posteriorly in the right upper lobe more  concerning for pneumonia. Aortic Atherosclerosis (ICD10-I70.0). Electronically Signed   By: Charlett Nose M.D.   On: 10/01/2022 18:25   US Venous Img Lower  Left (DVT Study)  Result Date: 10/01/2022 CLINICAL DATA:  Swelling and pain. EXAM: LEFT LOWER EXTREMITY VENOUS DOPPLER ULTRASOUND TECHNIQUE: Gray-scale sonography with compression, as well as color and duplex ultrasound, were performed to evaluate the deep venous system(s) from the level of the common femoral vein through the popliteal and proximal calf veins. COMPARISON:  Left lower extremity venous duplex ultrasound 08/11/2022. FINDINGS: VENOUS Normal compressibility of the common femoral, superficial femoral, and popliteal veins, as well as the visualized calf veins. Visualized portions of profunda femoral vein and great saphenous vein unremarkable. No filling defects to suggest DVT on grayscale or color Doppler imaging. Doppler waveforms show normal direction of venous flow, normal respiratory plasticity and response to augmentation. Limited views of the contralateral common femoral vein are unremarkable. OTHER Left calf edema. Limitations: none IMPRESSION: Negative. Electronically Signed   By: Orvan Falconer M.D.   On: 10/01/2022 16:57   DG Tibia/Fibula Left  Result Date: 10/01/2022 CLINICAL DATA:  Pain and swelling.  Wound. EXAM: LEFT TIBIA AND FIBULA - 2 VIEW COMPARISON:  None Available. FINDINGS: Severe osteopenia. Soft tissue swelling diffusely greatest towards the ankle. Diffuse vascular calcifications. No definite erosive changes. If there is further concern of bone infection, additional workup as clinically directed for further sensitivity such as bone scan or MRI IMPRESSION: Soft tissue swelling.  Severe osteopenia. Electronically Signed   By: Karen Kays M.D.   On: 10/01/2022 16:23   DG Foot Complete Left  Result Date:  10/01/2022 CLINICAL DATA:  Wound to left foot.  Swelling and pain EXAM: LEFT FOOT - COMPLETE 3 VIEW COMPARISON:  None Available. FINDINGS: Severe osteopenia. No acute fracture or dislocation. Mild degenerative changes of the dorsal aspect of the midfoot. Calcaneal spurs are well corticated. Scattered vascular calcifications. No definite erosive changes. Diffuse soft tissue swelling. However if there is further concern of bone infection, MRI or bone scan may be useful for much higher sensitivity. IMPRESSION: Severe osteopenia with soft tissue swelling and degenerative changes. Electronically Signed   By: Karen Kays M.D.   On: 10/01/2022 16:22   DG Chest Port 1 View  Result Date: 10/01/2022 CLINICAL DATA:  Hypoxia. EXAM: PORTABLE CHEST 1 VIEW COMPARISON:  August 11, 2022. FINDINGS: Mild cardiomegaly with central pulmonary vascular congestion. Stable interstitial densities are noted throughout both lungs which may represent scarring. Right basilar atelectasis or infiltrate is noted with small right pleural effusion. Bony thorax is unremarkable. IMPRESSION: Mild right basilar atelectasis or infiltrate is noted with small right pleural effusion. Mild cardiomegaly with central pulmonary vascular congestion. Stable chronic interstitial densities are noted bilaterally which may represent scarring. Electronically Signed   By: Lupita Raider M.D.   On: 10/01/2022 15:52    Procedures Procedures    Medications Ordered in ED Medications  0.9 %  sodium chloride infusion (has no administration in time range)  sodium chloride 0.9 % bolus 500 mL (500 mLs Intravenous New Bag/Given 10/01/22 1925)  ceFEPIme (MAXIPIME) 2 g in sodium chloride 0.9 % 100 mL IVPB (2 g Intravenous New Bag/Given 10/01/22 1924)  metroNIDAZOLE (FLAGYL) IVPB 500 mg (has no administration in time range)  vancomycin (VANCOCIN) IVPB 1000 mg/200 mL premix (has no administration in time range)  ceFEPIme (MAXIPIME) 2 g in sodium chloride 0.9 % 100 mL IVPB  (has no administration in time range)  vancomycin variable  dose per unstable renal function (pharmacist dosing) (has no administration in time range)  sodium chloride 0.9 % bolus 500 mL (500 mLs Intravenous New Bag/Given 10/01/22 1925)    ED Course/ Medical Decision Making/ A&P                                 Medical Decision Making Amount and/or Complexity of Data Reviewed Labs: ordered. Radiology: ordered. ECG/medicine tests: ordered.  Risk Prescription drug management. Decision regarding hospitalization.  CRITICAL CARE Performed by: Vanetta Mulders Total critical care time: 60 minutes Critical care time was exclusive of separately billable procedures and treating other patients. Critical care was necessary to treat or prevent imminent or life-threatening deterioration. Critical care was time spent personally by me on the following activities: development of treatment plan with patient and/or surrogate as well as nursing, discussions with consultants, evaluation of patient's response to treatment, examination of patient, obtaining history from patient or surrogate, ordering and performing treatments and interventions, ordering and review of laboratory studies, ordering and review of radiographic studies, pulse oximetry and re-evaluation of patient's condition.  Patient known to have chronic kidney disease.  Initial concern was for the left leg Doppler study was done which was negative x-rays done of the foot and tib-fib without any abnormalities.  But patient's white blood cell count 29,000.  Chest x-ray raise concerns about pleural effusion and pneumonia.  Lactic acid was 2.3 INR 1.7.  But patient's renal function was not very good.  Creatinine 2.82 for GFR 16 not too far off from baseline.  Liver function test without significant abnormalities.  CT chest was done without contrast which raises concerns for multifocal pneumonia.  And pleural effusions.  May explain patient's hypoxia.   On oxygen though her sats were very good.  Patient's leg pain suggestive of maybe some claudication but she has got good cap refill.  Certainly she has the pneumonia patients are treated with sepsis but not the full 30 cc/kg boluses.  Started on broad-spectrum antibiotics.  Contact hospitalist for admission.  COVID testing is pending.   Final Clinical Impression(s) / ED Diagnoses Final diagnoses:  Multifocal pneumonia  Hypoxia    Rx / DC Orders ED Discharge Orders     None         Vanetta Mulders, MD 10/01/22 1941

## 2022-10-01 NOTE — Progress Notes (Signed)
Pharmacy Antibiotic Note  CORETHA MELERINE is a 89 y.o. female admitted on 10/01/2022 with sepsis.  Pharmacy has been consulted for Cefepime and Vancomycin dosing. Patient with CKD - current Scr 2.82  Plan: Start Cefepime 2 gms IV q24hr Give Vanc 1 gm IV x 1 in the ED, then dose vanc based on renal function and vanc levels  Weight: 55 kg (121 lb 4.1 oz)  Temp (24hrs), Avg:97.7 F (36.5 C), Min:97.7 F (36.5 C), Max:97.7 F (36.5 C)  Recent Labs  Lab 10/01/22 1642  WBC 29.6*  CREATININE 2.82*  LATICACIDVEN 2.3*    Estimated Creatinine Clearance: 12.1 mL/min (A) (by C-G formula based on SCr of 2.82 mg/dL (H)).    Allergies  Allergen Reactions   Codeine Nausea And Vomiting    Thank you for allowing pharmacy to be a part of this patient's care.  Jeanella Cara, PharmD, [facility] Clinical Pharmacist Please see AMION for all Pharmacists' Contact Phone Numbers 10/01/2022, 6:06 PM

## 2022-10-01 NOTE — Assessment & Plan Note (Signed)
Blood pressure soft  -systolics 96-109. -Hold Lasix 40 mg, hydralazine 25 mg, metoprolol 12.5 mg, for now

## 2022-10-01 NOTE — Progress Notes (Signed)
Elink following sepsis bundle. °

## 2022-10-01 NOTE — Assessment & Plan Note (Signed)
Creatinine stable at 2.82.

## 2022-10-01 NOTE — Assessment & Plan Note (Signed)
O2 sats down to 85% on room air, likely due to multifocal pneumonia, and moderate right pleural effusion, small left pleural effusion.

## 2022-10-01 NOTE — Assessment & Plan Note (Signed)
Resume Synthroid ?

## 2022-10-01 NOTE — Assessment & Plan Note (Signed)
A1c 7.7. - SSI- S -Hold linagliptin

## 2022-10-01 NOTE — H&P (Addendum)
History and Physical    Marie Hunt ZOX:[id number] DOB: [birth year] DOA: 10/01/2022  PCP: Lindaann Pascal   Patient coming from: [facility]cal  I have personally briefly reviewed patient's old medical records in [facility] Link  Chief Complaint: Left foot swelling  HPI: Marie Hunt is a 89 y.o. female with medical history significant for systolic and diastolic CHF, diabetes mellitus, dementia, hypertension. Patient was brought to the ED via EMS for reports of pain to the left foot, patient unable to ambulate.  Patient has baseline dementia, she is able to answer simple questions, but history is limited.  Patient's daughter Steward Drone, and granddaughter Shirlean Mylar at bedside. Family noted increased work of breathing today here in the ED, but prior to this he had no difficulty breathing, she has a chronic cough that is worse than baseline.  Reports swelling and redness to her right foot.  At baseline she has had intermittent swelling to same foot due to chronic nonhealing ulcer.  ED Course: Temperature 97.7.  Heart rate 60s to 80s.  Respiratory rate 16-18.  Blood pressure systolic 96-109.  O2 sat 85% on room air-patient on 2 L sats greater than 97% Leukocytosis of 29.6.  Lactic acidosis of 2.3.  Left foot x-ray showing osteopenia and soft tissue swelling.  CTA chest suggested multifocal pneumonia, moderate right and small left pleural effusion. Broad-spectrum antibiotics IV vancomycin cefepime and metronidazole started. 1 L bolus given.  Review of Systems: As per HPI all other systems reviewed and negative.  Past Medical History:  Diagnosis Date   Anemia    Carotid artery disease (HCC)    1-39% RICA, patent LICA 7/12 - Dr. Edilia Bo   Cholelithiasis    Cholelithiasis    In need of cholecystectomy   Diabetes mellitus    Type II   Essential hypertension, benign    GERD (gastroesophageal reflux disease)    GERD (gastroesophageal reflux disease)    Mixed hyperlipidemia    Sigmoid  diverticulosis    Sigmoid diverticulosis    Type 2 diabetes mellitus (HCC)     Past Surgical History:  Procedure Laterality Date   CAROTID ENDARTERECTOMY  08/24/2009   Left catroid endarterectomy   CATARACT EXTRACTION W/PHACO Left 08/17/2014   Procedure: CATARACT EXTRACTION PHACO AND INTRAOCULAR LENS PLACEMENT; CDE:  7.39;  Surgeon: Gemma Payor, MD;  Location: [facility];  Service: Ophthalmology;  Laterality: Left;   CATARACT EXTRACTION W/PHACO Right 10/02/2014   Procedure: CATARACT EXTRACTION PHACO AND INTRAOCULAR LENS PLACEMENT RIGHT EYE CDE=5.83;  Surgeon: Gemma Payor, MD;  Location: [facility];  Service: Ophthalmology;  Laterality: Right;   CYSTOSCOPY W/ RETROGRADES Bilateral 03/28/2019   Procedure: CYSTOSCOPY WITH BILATRAL RETROGRADE PYELOGRAM,;  Surgeon: Malen Gauze, MD;  Location: [facility];  Service: Urology;  Laterality: Bilateral;   EYE SURGERY     HIP ARTHROPLASTY Left 10/03/2014   Procedure: ARTHROPLASTY BIPOLAR HIP (HEMIARTHROPLASTY);  Surgeon: Darreld Mclean, MD;  Location: [facility];  Service: Orthopedics;  Laterality: Left;   Left carotid endarterectomy  7/11   Dr. Edilia Bo   TRANSURETHRAL RESECTION OF BLADDER TUMOR N/A 03/28/2019   Procedure: TRANSURETHRAL RESECTION OF BLADDER TUMOR (TURBT);  Surgeon: Malen Gauze, MD;  Location: [facility];  Service: Urology;  Laterality: N/A;   URETEROSCOPY Left 03/28/2019   Procedure: DIAGNOSTIC URETEROSCOPY WITH LEFT URETERAL STENT PLACEMENT;  Surgeon: Malen Gauze, MD;  Location: [facility];  Service: Urology;  Laterality: Left;     reports that she quit smoking about 10 years ago. Her smoking  use included cigarettes. She started smoking about 70 years ago. She has a 18 pack-year smoking history. She has never used smokeless tobacco. She reports that she does not drink alcohol and does not use drugs.  Allergies  Allergen Reactions   Codeine Nausea And Vomiting    Family History  Problem Relation Age of Onset   Diabetes Mother    Diabetes  Father    Heart attack Daughter    Other Daughter        Bleeding problems   Cancer Daughter    Coronary artery disease Other    Heart disease Sister    Diabetes Sister    Hyperlipidemia Sister    Hypertension Sister    Heart disease Sister    Heart disease Sister     Prior to Admission medications   Medication Sig Start Date End Date Taking? Authorizing Provider  acetaminophen (TYLENOL 8 HOUR) 650 MG CR tablet 650 mg every 4 (four) hours as needed for pain. Per MAR, tab, elixir or rectally.   Yes [provider]  acetaminophen (TYLENOL) 325 MG tablet Take 2 tablets (650 mg total) by mouth every 6 (six) hours as needed for mild pain, headache or fever (or Fever >/= 101). 08/13/22  Yes Johnson, Clanford L, MD  Amino Acids-Protein Hydrolys (PRO-STAT 64 PO) Take 30 mLs by mouth every morning.   Yes [provider]  ascorbic acid (VITAMIN C) 500 MG tablet Take 500 mg by mouth 2 (two) times daily.   Yes [provider]  diclofenac Sodium (VOLTAREN) 1 % GEL Apply 4 g topically daily as needed (for pain). Apply to arthritic/gout joints 03/25/19  Yes [provider]  ELIQUIS 2.5 MG TABS tablet Take 2.5 mg by mouth 2 (two) times daily. 09/03/22  Yes [provider]  famotidine (PEPCID) 20 MG tablet Take 20 mg by mouth at bedtime.    Yes [provider]  ferrous sulfate 325 (65 FE) MG tablet Take 325 mg by mouth every morning.   Yes [provider]  furosemide (LASIX) 40 MG tablet Take 1 tablet (40 mg total) by mouth daily. 07/08/22  Yes Osvaldo Shipper, MD  hydrALAZINE (APRESOLINE) 25 MG tablet Take 1 tablet (25 mg total) by mouth every 8 (eight) hours. Patient taking differently: Take 25 mg by mouth 2 (two) times daily. 07/08/22  Yes Osvaldo Shipper, MD  isosorbide mononitrate (IMDUR) 30 MG 24 hr tablet Take 0.5 tablets (15 mg total) by mouth daily. 07/09/22  Yes Osvaldo Shipper, MD  levothyroxine (SYNTHROID) 75 MCG tablet Take 0.5 tablets  (37.5 mcg total) by mouth daily at 6 (six) AM. 07/09/22  Yes Osvaldo Shipper, MD  linagliptin (TRADJENTA) 5 MG TABS tablet Take 1 tablet (5 mg total) by mouth daily. 08/13/22  Yes Johnson, Clanford L, MD  melatonin 5 MG TABS Take 5 mg by mouth at bedtime.   Yes [provider]  metoprolol succinate (TOPROL-XL) 25 MG 24 hr tablet Take 0.5 tablets (12.5 mg total) by mouth daily. 07/09/22  Yes Osvaldo Shipper, MD  Multiple Vitamin (MULTIVITAMIN WITH MINERALS) TABS tablet Take 1 tablet by mouth daily. 08/14/22  Yes Johnson, Maylene Roes, MD  Podiatric Products (EUCERIN ADVANCED REPAIR FOOT) CREA Apply 1 application  topically 2 (two) times daily. Apply to bilateral heels topically two times a day for hydration of skin prior to placing and after removing of compression hose.   Yes [provider]  Polyvinyl Alcohol-Povidone (ARTIFICIAL TEARS) 5-6 MG/ML SOLN Place 1 drop into  both eyes daily as needed (dry eyes).   Yes [provider]  SANTYL 250 UNIT/GM ointment Apply 1 Application topically daily. Apply to Lt lateral foot topically every day shift for wound care. 10/01/22  Yes [provider]  simvastatin (ZOCOR) 20 MG tablet Take 20 mg by mouth at bedtime.   Yes [provider]  traZODone (DESYREL) 50 MG tablet Take 1 tablet (50 mg total) by mouth at bedtime. 08/13/22  Yes Johnson, Clanford L, MD  Zinc 220 (50 Zn) MG CAPS Take 1 capsule by mouth every morning.   Yes [provider]  Global Inject Ease Lancets 30G MISC See admin instructions. 06/23/19   [provider]  [facility] VERIO test strip 1 each daily. 06/23/19   [provider]    Physical Exam: Vitals:   10/01/22 1700 10/01/22 1730 10/01/22 1745 10/01/22 1934  BP: (!) 104/55 (!) 101/53 (!) 109/58 (!) 101/49  Pulse: 66 69 71 80  Resp:   18 17  Temp:    97.7 F (36.5 C)  TempSrc:    Oral  SpO2: 99% 99% 100% 100%  Weight:   55 kg     Constitutional: NAD, calm,  comfortable Vitals:   10/01/22 1700 10/01/22 1730 10/01/22 1745 10/01/22 1934  BP: (!) 104/55 (!) 101/53 (!) 109/58 (!) 101/49  Pulse: 66 69 71 80  Resp:   18 17  Temp:    97.7 F (36.5 C)  TempSrc:    Oral  SpO2: 99% 99% 100% 100%  Weight:   55 kg    Eyes: PERRL, lids and conjunctivae normal ENMT: Mucous membranes are moist.   Neck: normal, supple, no masses, no thyromegaly Respiratory: Anterior auscultation, clear to auscultation bilaterally, no wheezing, no crackles. Normal respiratory effort. No accessory muscle use.  Cardiovascular: Regular rate and rhythm, no murmurs / rubs / gallops. No extremity edema- right,  mild swelling around the ankle to the left.  Extremities warm. Abdomen: no tenderness, no masses palpated. No hepatosplenomegaly. Bowel sounds positive.  Musculoskeletal: no clubbing / cyanosis. No joint deformity upper and lower extremities. Good ROM, no contractures. Normal muscle tone.  Skin: Erythema, tenderness, swelling to left ankle and feet, no appreciable differential warmth.  Stage 2 ulcer to lateral aspect of left feet with minimal drainage. Neurologic: CN 2-12 grossly intact. Sensation intact, DTR normal. Strength 5/5 in all 4.  Psychiatric: Awake and alert, oriented to person.   .   Labs on Admission: I have personally reviewed following labs and imaging studies  CBC: Recent Labs  Lab 10/01/22 1642  WBC 29.6*  NEUTROABS 27.8*  HGB 7.5*  HCT 24.3*  MCV 83.5  PLT 170   Basic Metabolic Panel: Recent Labs  Lab 10/01/22 1642  NA 135  K 4.8  CL 103  CO2 20*  GLUCOSE 275*  BUN 44*  CREATININE 2.82*  CALCIUM 8.8*   GFR: Estimated Creatinine Clearance: 12.1 mL/min (A) (by C-G formula based on SCr of 2.82 mg/dL (H)). Liver Function Tests: Recent Labs  Lab 10/01/22 1642  AST 18  ALT 13  ALKPHOS 132*  BILITOT 0.5  PROT 6.4*  ALBUMIN 2.4*   Coagulation Profile: Recent Labs  Lab 10/01/22 1642  INR 1.7*   Urine analysis:     Component Value Date/Time   COLORURINE STRAW (A) 08/11/2022 2007   APPEARANCEUR CLEAR 08/11/2022 2007   APPEARANCEUR Cloudy (A) 01/01/2022 1035   LABSPEC 1.008 08/11/2022 2007   PHURINE 5.0 08/11/2022 2007   GLUCOSEU NEGATIVE  08/11/2022 2007   HGBUR NEGATIVE 08/11/2022 2007   BILIRUBINUR NEGATIVE 08/11/2022 2007   BILIRUBINUR Negative 01/01/2022 1035   KETONESUR NEGATIVE 08/11/2022 2007   PROTEINUR NEGATIVE 08/11/2022 2007   UROBILINOGEN negative (A) 07/13/2019 1536   UROBILINOGEN 0.2 10/06/2014 1505   NITRITE NEGATIVE 08/11/2022 2007   LEUKOCYTESUR SMALL (A) 08/11/2022 2007    Radiological Exams on Admission: CT Chest Wo Contrast  Result Date: 10/01/2022 CLINICAL DATA:  Pneumonia, complication suspected, xray done. Abnormal x-ray EXAM: CT CHEST WITHOUT CONTRAST TECHNIQUE: Multidetector CT imaging of the chest was performed following the standard protocol without IV contrast. RADIATION DOSE REDUCTION: This exam was performed according to the departmental dose-optimization program which includes automated exposure control, adjustment of the mA and/or kV according to patient size and/or use of iterative reconstruction technique. COMPARISON:  X-ray today. FINDINGS: Cardiovascular: Cardiomegaly. Diffuse coronary artery and aortic atherosclerosis. No aneurysm. Mediastinum/Nodes: No mediastinal, hilar, or axillary adenopathy. Trachea and esophagus are unremarkable. Thyroid unremarkable. Lungs/Pleura: Small left pleural effusion and moderate right pleural effusion. Bilateral lower lobe airspace opacities dependently, favor atelectasis although is difficult to exclude pneumonia. Ground-glass airspace opacity posteriorly in the right upper lobe also could reflect pneumonia. Upper Abdomen: No acute findings Musculoskeletal: Chest wall soft tissues are unremarkable. No acute bony abnormality. IMPRESSION: Cardiomegaly, diffuse coronary artery disease. Moderate right pleural effusion and small left pleural  effusion. Bilateral lower lobe airspace opacities, mostly dependent. Favor atelectasis although pneumonia cannot be excluded. Ground-glass airspace disease posteriorly in the right upper lobe more concerning for pneumonia. Aortic Atherosclerosis (ICD10-I70.0). Electronically Signed   By: Charlett Nose M.D.   On: 10/01/2022 18:25   US Venous Img Lower  Left (DVT Study)  Result Date: 10/01/2022 CLINICAL DATA:  Swelling and pain. EXAM: LEFT LOWER EXTREMITY VENOUS DOPPLER ULTRASOUND TECHNIQUE: Gray-scale sonography with compression, as well as color and duplex ultrasound, were performed to evaluate the deep venous system(s) from the level of the common femoral vein through the popliteal and proximal calf veins. COMPARISON:  Left lower extremity venous duplex ultrasound 08/11/2022. FINDINGS: VENOUS Normal compressibility of the common femoral, superficial femoral, and popliteal veins, as well as the visualized calf veins. Visualized portions of profunda femoral vein and great saphenous vein unremarkable. No filling defects to suggest DVT on grayscale or color Doppler imaging. Doppler waveforms show normal direction of venous flow, normal respiratory plasticity and response to augmentation. Limited views of the contralateral common femoral vein are unremarkable. OTHER Left calf edema. Limitations: none IMPRESSION: Negative. Electronically Signed   By: Orvan Falconer M.D.   On: 10/01/2022 16:57   DG Tibia/Fibula Left  Result Date: 10/01/2022 CLINICAL DATA:  Pain and swelling.  Wound. EXAM: LEFT TIBIA AND FIBULA - 2 VIEW COMPARISON:  None Available. FINDINGS: Severe osteopenia. Soft tissue swelling diffusely greatest towards the ankle. Diffuse vascular calcifications. No definite erosive changes. If there is further concern of bone infection, additional workup as clinically directed for further sensitivity such as bone scan or MRI IMPRESSION: Soft tissue swelling.  Severe osteopenia. Electronically Signed   By: Karen Kays M.D.   On: 10/01/2022 16:23   DG Foot Complete Left  Result Date: 10/01/2022 CLINICAL DATA:  Wound to left foot.  Swelling and pain EXAM: LEFT FOOT - COMPLETE 3 VIEW COMPARISON:  None Available. FINDINGS: Severe osteopenia. No acute fracture or dislocation. Mild degenerative changes of the dorsal aspect of the midfoot. Calcaneal spurs are well corticated. Scattered vascular calcifications. No definite erosive changes. Diffuse soft tissue swelling. However if  there is further concern of bone infection, MRI or bone scan may be useful for much higher sensitivity. IMPRESSION: Severe osteopenia with soft tissue swelling and degenerative changes. Electronically Signed   By: Karen Kays M.D.   On: 10/01/2022 16:22   DG Chest Port 1 View  Result Date: 10/01/2022 CLINICAL DATA:  Hypoxia. EXAM: PORTABLE CHEST 1 VIEW COMPARISON:  August 11, 2022. FINDINGS: Mild cardiomegaly with central pulmonary vascular congestion. Stable interstitial densities are noted throughout both lungs which may represent scarring. Right basilar atelectasis or infiltrate is noted with small right pleural effusion. Bony thorax is unremarkable. IMPRESSION: Mild right basilar atelectasis or infiltrate is noted with small right pleural effusion. Mild cardiomegaly with central pulmonary vascular congestion. Stable chronic interstitial densities are noted bilaterally which may represent scarring. Electronically Signed   By: Lupita Raider M.D.   On: 10/01/2022 15:52    EKG: Independently reviewed.  Sinus rhythm, rate 76, QTc 495.  None specific t wave changes to V3 through V4.  Assessment/Plan Principal Problem:   Multifocal pneumonia Active Problems:   Acute hypoxic respiratory failure (HCC)   Left leg cellulitis   Type 2 diabetes mellitus (HCC)   Chronic combined systolic and diastolic CHF (congestive heart failure) (HCC)   Secondary DM with CKD stage 4 and hypertension (HCC)   Vascular dementia without behavioral disturbance  (HCC)   History of gout   Essential hypertension   Acquired hypothyroidism  Assessment and Plan: * Multifocal pneumonia Pneumonia with acute hypoxic respiratory failure.  CTA chest showing multifocal pneumonia, also with moderate right and small left pleural effusion.  Significant leukocytosis of 29.6.  Lactic acid 2.3 > 2. - May need thoracentesis, rule out empyema -IV vancomycin and ceftriaxone and doxycycline ( Qt prolonged 495) -F/u Blood cultures -COVID test pending -1 L bolus given, continue N/s 75cc/hr x 12hrs -Need to confirm indication for Eliquis, no documentation in chart- (Eliquis held for now pending Ct findings) called patient's granddaughter Shirlean Mylar back to ask, no response.  Left leg cellulitis Increasing pain, tenderness on exam, with swelling and erythema, chronic nonhealing ulcer with minimal drainage. Significant leukocytosis of 29.6.  History of gout.  Rules out for sepsis. - Obtain CT of the left foot considering significant leukocytosis -Continue IV vancomycin and ceftriaxone, add doxycycline for pneumonia - Wound Care consult  Acute hypoxic respiratory failure (HCC) O2 sats down to 85% on room air, likely due to multifocal pneumonia, and moderate right pleural effusion, small left pleural effusion.  History of gout Left foot swelling, likely cellulitis, but also possibly gout.  Not on medication for gout.   Vascular dementia without behavioral disturbance (HCC) Stable.  Nursing home resident.  Baseline most times able to recognize family, answer simple questions, significant short-term memory loss.  Secondary DM with CKD stage 4 and hypertension (HCC) Creatinine stable at 2.82.  Chronic combined systolic and diastolic CHF (congestive heart failure) (HCC) No peripheral signs of volume overload.  CT showing pleural effusions.  Type 2 diabetes mellitus (HCC) A1c 7.7. - SSI- S -Hold linagliptin  Acquired hypothyroidism Resume Synthroid  Essential  hypertension Blood pressure soft  -systolics 96-109. -Hold Lasix 40 mg, hydralazine 25 mg, metoprolol 12.5 mg, for now   DVT prophylaxis: heparin Code Status: DNR, confirmed with patient's Grand daughter Shirlean Mylar at bedside, and daughter Steward Drone at bedside Family Communication: Daughter Steward Drone, and granddaughter Shirlean Mylar at bedside.  Patient's granddaughter [name] is HCPOA Disposition Plan: > 2 days Consults called: None.  Admission status: Inpt Tele I  certify that at the point of admission it is my clinical judgment that the patient will require inpatient hospital care spanning beyond 2 midnights from the point of admission due to high intensity of service, high risk for further deterioration and high frequency of surveillance required.    Author: Onnie Boer, MD 10/01/2022 9:40 PM  For on call review [email or web address].

## 2022-10-01 NOTE — Consult Note (Signed)
WOC Nurse Consult Note: Reason for Consult:left lateral foot chronic, nonhealing wound. Consult performed remotely after review of the EMR including photographs. Wound type:neuropathic Pressure Injury POA: N/A Measurement:To be measured by Bedside RN and documented on nursing flow sheet Wound WUJ:WJXB moist Drainage (amount, consistency, odor) small serous Periwound:macerated Dressing procedure/placement/frequency: I have provided Nursing with guidance for care of this lesion using a daily soap and water cleanse followed by covering with a silver hydrofiber (Aquacel Ag+, [employer] [place] # [id number]). This is to be changed daily. Feet are to be placed into Preval[employer] Boots.  WOC nursing team will not follow, but will remain available to this patient, the nursing and medical teams.  Please re-consult if needed.  Thank you for inviting [place] to participate in this patient's Plan of Care.  Ladona Mow, MSN, RN, CNS, GNP, Leda Min, [employer], [employer] phone:  [phone]

## 2022-10-01 NOTE — Assessment & Plan Note (Addendum)
Pneumonia with acute hypoxic respiratory failure.  CTA chest showing multifocal pneumonia, also with moderate right and small left pleural effusion.  Significant leukocytosis of 29.6.  Lactic acid 2.3 > 2. - May need thoracentesis, rule out empyema -IV vancomycin and ceftriaxone and doxycycline ( Qt prolonged 495) -F/u Blood cultures -COVID test pending -1 L bolus given, continue N/s 75cc/hr x 12hrs -Need to confirm indication for Eliquis, no documentation in chart- (Eliquis held for now pending Ct findings) called patient's granddaughter Shirlean Mylar back to ask, no response.

## 2022-10-01 NOTE — Assessment & Plan Note (Signed)
Left foot swelling, likely cellulitis, but also possibly gout.  Not on medication for gout.

## 2022-10-01 NOTE — Assessment & Plan Note (Addendum)
Increasing pain, tenderness on exam, with swelling and erythema, chronic nonhealing ulcer with minimal drainage. Significant leukocytosis of 29.6.  History of gout.  Rules out for sepsis. - Obtain CT of the left foot considering significant leukocytosis -Continue IV vancomycin and ceftriaxone, add doxycycline for pneumonia - Wound Care consult

## 2022-10-02 ENCOUNTER — Inpatient Hospital Stay (HOSPITAL_COMMUNITY): Payer: 59

## 2022-10-02 DIAGNOSIS — Z7189 Other specified counseling: Secondary | ICD-10-CM

## 2022-10-02 DIAGNOSIS — Z515 Encounter for palliative care: Secondary | ICD-10-CM

## 2022-10-02 DIAGNOSIS — J189 Pneumonia, unspecified organism: Secondary | ICD-10-CM | POA: Diagnosis not present

## 2022-10-02 LAB — CULTURE, BLOOD (ROUTINE X 2)
Special Requests: ADEQUATE
Special Requests: ADEQUATE

## 2022-10-02 LAB — GLUCOSE, CAPILLARY
Glucose-Capillary: 153 mg/dL — ABNORMAL HIGH (ref 70–99)
Glucose-Capillary: 147 mg/dL — ABNORMAL HIGH (ref 70–99)
Glucose-Capillary: 144 mg/dL — ABNORMAL HIGH (ref 70–99)
Glucose-Capillary: 132 mg/dL — ABNORMAL HIGH (ref 70–99)

## 2022-10-02 MED ORDER — ADULT MULTIVITAMIN W/MINERALS CH
1.0000 | ORAL_TABLET | Freq: Every day | ORAL | Status: DC
  Administered 2022-10-07 – 2022-10-09 (×3): 1 via ORAL
  Filled 2022-10-02 (×6): qty 1

## 2022-10-02 MED ORDER — PROSOURCE PLUS PO LIQD
30.0000 mL | Freq: Two times a day (BID) | ORAL | Status: DC
  Administered 2022-10-02 – 2022-10-05 (×4): 30 mL via ORAL
  Filled 2022-10-02 (×4): qty 30

## 2022-10-02 MED ORDER — OXYCODONE HCL 5 MG PO TABS
5.0000 mg | ORAL_TABLET | Freq: Four times a day (QID) | ORAL | Status: DC | PRN
  Administered 2022-10-02 – 2022-10-03 (×2): 5 mg via ORAL
  Filled 2022-10-02 (×2): qty 1

## 2022-10-02 MED ORDER — SODIUM CHLORIDE 0.9 % IV SOLN
3.0000 g | INTRAVENOUS | Status: DC
  Administered 2022-10-02: 3 g via INTRAVENOUS
  Filled 2022-10-02: qty 8

## 2022-10-02 MED ORDER — GLUCERNA SHAKE PO LIQD
237.0000 mL | Freq: Three times a day (TID) | ORAL | Status: DC
  Administered 2022-10-03 – 2022-10-05 (×2): 237 mL via ORAL
  Administered 2022-10-05: 60 mL via ORAL
  Administered 2022-10-06: 237 mL via ORAL

## 2022-10-02 MED ORDER — FENTANYL CITRATE PF 50 MCG/ML IJ SOSY
12.5000 ug | PREFILLED_SYRINGE | INTRAMUSCULAR | Status: AC | PRN
  Administered 2022-10-02 – 2022-10-04 (×3): 50 ug via INTRAVENOUS
  Filled 2022-10-02 (×3): qty 1

## 2022-10-02 MED ORDER — VANCOMYCIN HCL 750 MG/150ML IV SOLN: 750.0000 mg | INTRAVENOUS | Status: DC

## 2022-10-02 NOTE — Plan of Care (Signed)

## 2022-10-02 NOTE — Progress Notes (Addendum)
CT of Marie Hunt's left foot has been ordered. Staff went to get Marie Hunt to transport to CT and POA (granddaughter) does not want [place] to take Marie Hunt tonight, because she is sundowning and will be combative and uncooperative. CT has been notified of daughter decision.

## 2022-10-02 NOTE — Consult Note (Signed)
Consultation Note Date: 10/02/2022   Patient Name: Marie Hunt  DOB: 09-15-1932  MRN: [id number]  Age / Sex: 89 y.o., female  PCP: Marie Hunt Referring Physician: Erick Blinks, DO  Reason for Consultation: Establishing goals of care  HPI/Patient Profile: 89 y.o. female  with past medical history of dementia, systolic and diastolic CHF EF 41-32%, HTN, CAD, diabetes, CKD stage 4, TURBT 2021 for bladder tumor, diverticulosis admitted on 10/01/2022 from [facility] with left foot swelling with cellulitis. Also being treated for RUL pneumonia  Clinical Assessment and Goals of Care: Consult received and chart review completed. I met today with Marie Hunt is sleeping peacefully with granddaughter/HCPOA, Marie Hunt, at bedside. GMWNUUV and I review Marie Hunt's decline over the past months although [facility] reports that she was actually doing well at [facility]. She was able to ambulate and eating decently and making friends there.   We discussed goals of care. Marie Hunt had 6 children and 4 living. Marie Hunt has a scheduled conference call to discuss with family this evening. Marie Hunt shares that her grandmother has always told them she would never want dialysis. Marie Hunt shares that her grandmother had also shared many, many years ago that she would want to be resuscitated. However, Marie Hunt knows that her quality of life has drastically changed and she understands the poor outcomes and suffering that came along with resuscitation. Marie Hunt shares that the other family members are struggling with this. I will email her an electronic copy of Hard Choices to share with family.   All questions/concerns addressed. Emotional support provided.   Primary Decision Maker HCPOA Marie Hunt     SUMMARY OF RECOMMENDATIONS   - Family meeting this evening to discuss code status - Continue with conservative  treatments - HCPOA open to procedures if no improvement with conservative measures  Code Status/Advance Care Planning: Full code - under discussion by family   Symptom Management:  Per attending  Prognosis:  Overall prognosis poor.   Discharge Planning: To Be Determined      Primary Diagnoses: Present on Admission:  Multifocal pneumonia  Left leg cellulitis  Acquired hypothyroidism  Chronic combined systolic and diastolic CHF (congestive heart failure) (HCC)  Essential hypertension  Vascular dementia without behavioral disturbance (HCC)  Secondary DM with CKD stage 4 and hypertension (HCC)  Acute hypoxic respiratory failure (HCC)   I have reviewed the medical record, interviewed the patient and family, and examined the patient. The following aspects are pertinent.  Past Medical History:  Diagnosis Date   Anemia    Carotid artery disease (HCC)    1-39% RICA, patent LICA 7/12 - Marie Hunt   Cholelithiasis    Cholelithiasis    In need of cholecystectomy   Diabetes mellitus    Type II   Essential hypertension, benign    GERD (gastroesophageal reflux disease)    GERD (gastroesophageal reflux disease)    Mixed hyperlipidemia    Sigmoid diverticulosis    Sigmoid diverticulosis    Type 2 diabetes mellitus (HCC)  Social History   Socioeconomic History   Marital status: Widowed    Spouse name: Not on file   Number of children: Not on file   Years of education: Not on file   Highest education level: Not on file  Occupational History   Not on file  Tobacco Use   Smoking status: Former    Current packs/day: 0.00    Average packs/day: 0.3 packs/day for 60.0 years (18.0 ttl pk-yrs)    Types: Cigarettes    Start date: 10/27/1951    Quit date: 10/27/2011    Years since quitting: 10.9   Smokeless tobacco: Never  Substance and Sexual Activity   Alcohol use: No    Alcohol/week: 0.0 standard drinks of alcohol   Drug use: No   Sexual activity: Never  Other Topics  Concern   Not on file  Social History Narrative   Not on file   Social Determinants of Health   Financial Resource Strain: Not on file  Food Insecurity: No Food Insecurity (08/12/2022)   Hunger Vital Sign    Worried About Running Out of Food in the Last Year: Never true    Ran Out of Food in the Last Year: Never true  Transportation Needs: No Transportation Needs (08/12/2022)   PRAPARE - Administrator, Civil Service (Medical): No    Lack of Transportation (Non-Medical): No  Physical Activity: Not on file  Stress: Not on file  Social Connections: Not on file   Family History  Problem Relation Age of Onset   Diabetes Mother    Diabetes Father    Heart attack Daughter    Other Daughter        Bleeding problems   Cancer Daughter    Coronary artery disease Other    Heart disease Sister    Diabetes Sister    Hyperlipidemia Sister    Hypertension Sister    Heart disease Sister    Heart disease Sister    Scheduled Meds:  (feeding supplement) PROSource Plus  30 mL Oral BID BM   feeding supplement (GLUCERNA SHAKE)  237 mL Oral TID BM   heparin  5,000 Units Subcutaneous Q8H   insulin aspart  0-5 Units Subcutaneous QHS   insulin aspart  0-9 Units Subcutaneous TID WC   levothyroxine  37.5 mcg Oral Q0600   multivitamin with minerals  1 tablet Oral Q lunch   vancomycin variable dose per unstable renal function (pharmacist dosing)   Does not apply See admin instructions   Continuous Infusions:  cefTRIAXone (ROCEPHIN)  IV     doxycycline (VIBRAMYCIN) IV 100 mg (10/01/22 2329)   PRN Meds:.acetaminophen **OR** acetaminophen, polyethylene glycol Allergies  Allergen Reactions   Codeine Nausea And Vomiting   Review of Systems  Unable to perform ROS: Dementia    Physical Exam Vitals and nursing note reviewed.  Constitutional:      General: She is sleeping. She is not in acute distress.    Appearance: She is ill-appearing.     Comments: Elderly, frail   Cardiovascular:     Rate and Rhythm: Normal rate.  Pulmonary:     Effort: No tachypnea, accessory muscle usage or respiratory distress.  Abdominal:     General: Abdomen is flat.  Neurological:     Comments: Baseline confusion due to dementia     Vital Signs: BP (!) 105/50 (BP Location: Left Arm)   Pulse 85   Temp 98.2 F (36.8 C) (Axillary)   Resp 15  Wt 61.4 kg   SpO2 93%   BMI 23.23 kg/m  Pain Scale: 0-10       SpO2: SpO2: 93 % O2 Device:SpO2: 93 % O2 Flow Rate: .O2 Flow Rate (L/min): 2 L/min  IO: Intake/output summary:  Intake/Output Summary (Last 24 hours) at 10/02/2022 1029 Last data filed at 10/02/2022 0958 Gross per 24 hour  Intake 100 ml  Output 100 ml  Net 0 ml    LBM:   Baseline Weight: Weight: 55 kg Most recent weight: Weight: 61.4 kg     Palliative Assessment/Data:    Time Total: 60 min  Greater than 50%  of this time was spent counseling and coordinating care related to the above assessment and plan.  Signed by: Yong Channel, NP Palliative Medicine Team Pager # [phone] (M-F 8a-5p) Team Phone # [phone] (Nights/Weekends)

## 2022-10-02 NOTE — Progress Notes (Signed)
Initial Nutrition Assessment  DOCUMENTATION CODES:   Not applicable  INTERVENTION:   -Liberalize diet to carb modified for wider variety of meal selections -MVI with minerals daily -30 ml Prosource Plus BID, each supplement provides 100 kcals and 15 grams protein -Glucerna Shake po TID, each supplement provides 220 kcal and 10 grams of protein   NUTRITION DIAGNOSIS:   Increased nutrient needs related to wound healing as evidenced by estimated needs.  GOAL:   Patient will meet greater than or equal to 90% of their needs  MONITOR:   PO intake, Supplement acceptance  REASON FOR ASSESSMENT:   Malnutrition Screening Tool    ASSESSMENT:   Pt with medical history significant for systolic and diastolic CHF, diabetes mellitus, dementia, hypertension.  Pt admitted with multifocal pneumonia and lt leg cellulitis.  Reviewed I/O's: +100 ml x 24 hours  Pt unavailable at time of visit. Attempted to speak with pt via call to hospital room phone, however, unable to reach. RD unable to obtain further nutrition-related history or complete nutrition-focused physical exam at this time.    Pt from [facility].   Per [facility] notes, pt with chronic, non-healing wound to lt lateral foot. CT of lt foot was ordered, however, refused last night secondary to sundowning.   Pt currently on a heart healthy, carb modified diet. No meal completion data available to assess at this time.  Reviewed wt hx; wt has been stable over the past year.   Noted pt taking zinc and Prostat PTA.   Medications reviewed.   Lab Results  Component Value Date   HGBA1C 7.7 (H) 07/03/2022   PTA DM medications are 5mg  linagliption daily.   Labs reviewed: CBGS: 153-230 (inpatient orders for glycemic control are 0-5 units insulin aspart daily at bedtime and 0-9 units insulin aspart TID with meals).    Diet Order:   Diet Order             Diet heart healthy/carb modified Room service appropriate? Yes; Fluid  consistency: Thin  Diet effective now                   EDUCATION NEEDS:   No education needs have been identified at this time  Skin:  Skin Assessment: Skin Integrity Issues: Skin Integrity Issues:: Other (Comment) Other: non-pressure wound to lt foot  Last BM:  Unknown  Height:   Ht Readings from Last 1 Encounters:  09/01/22 5\' 4"  (1.626 m)    Weight:   Wt Readings from Last 1 Encounters:  10/01/22 61.4 kg    Ideal Body Weight:  54.5 kg  BMI:  Body mass index is 23.23 kg/m.  Estimated Nutritional Needs:   Kcal:  1650-1850  Protein:  80-95 grams  Fluid:  > 1.6 L    Levada Schilling, RD, LDN, CDCES Registered Dietitian II Certified Diabetes Care and Education Specialist Please refer to [facility] for RD and/or RD on-call/weekend/after hours pager

## 2022-10-02 NOTE — Progress Notes (Signed)
PROGRESS NOTE    Marie Hunt  ZOX:[id number] DOB: 06/07/1932 DOA: 10/01/2022 PCP: Lindaann Pascal   Brief Narrative:    Marie Hunt is a 89 y.o. female with medical history significant for systolic and diastolic CHF, diabetes mellitus, dementia, hypertension. Patient was brought to the ED via EMS for reports of pain to the left foot, patient unable to ambulate.  She has also noticed increased work of breathing from her baseline as well as worsening chronic cough.  She was admitted for multifocal pneumonia with bilateral pleural effusions and started on IV antibiotics.  Assessment & Plan:   Principal Problem:   Multifocal pneumonia Active Problems:   Acute hypoxic respiratory failure (HCC)   Left leg cellulitis   Type 2 diabetes mellitus (HCC)   Chronic combined systolic and diastolic CHF (congestive heart failure) (HCC)   Secondary DM with CKD stage 4 and hypertension (HCC)   Vascular dementia without behavioral disturbance (HCC)   History of gout   Essential hypertension   Acquired hypothyroidism  Assessment and Plan:    Multifocal pneumonia Pneumonia with acute hypoxic respiratory failure.  CTA chest showing multifocal pneumonia, also with moderate right and small left pleural effusion.  Significant leukocytosis of 29.6.  Lactic acid 2.3 > 2. - May need thoracentesis, rule out empyema -IV vancomycin and ceftriaxone and doxycycline ( Qt prolonged 495) -F/u Blood cultures -COVID test pending -1 L bolus given, continue N/s 75cc/hr x 12hrs -Need to confirm indication for Eliquis, no documentation in chart- (Eliquis held for now pending Ct findings) called patient's granddaughter Marie Hunt back to ask, no response. -Awaiting ultrasound thoracentesis, will reassess in a.m. to see if symptoms are not improving   Left leg cellulitis Increasing pain, tenderness on exam, with swelling and erythema, chronic nonhealing ulcer with minimal drainage. Significant leukocytosis of  29.6.  History of gout.  Rules out for sepsis. - Obtain CT of the left foot considering significant leukocytosis -Continue IV vancomycin and ceftriaxone, add doxycycline for pneumonia - Wound Care consult   Acute hypoxic respiratory failure (HCC) O2 sats down to 85% on room air, likely due to multifocal pneumonia, and moderate right pleural effusion, small left pleural effusion.   History of gout Left foot swelling, likely cellulitis, but also possibly gout.  Not on medication for gout.     Vascular dementia without behavioral disturbance (HCC) Stable.  Nursing home resident.  Baseline most times able to recognize family, answer simple questions, significant short-term memory loss.   Secondary DM with CKD stage 4 and hypertension (HCC) Creatinine stable at 2.82.   Chronic combined systolic and diastolic CHF (congestive heart failure) (HCC) No peripheral signs of volume overload.  CT showing pleural effusions.   Type 2 diabetes mellitus (HCC) A1c 7.7. - SSI- S -Hold linagliptin   Acquired hypothyroidism Resume Synthroid   Essential hypertension Blood pressure soft  -systolics 96-109. -Hold Lasix 40 mg, hydralazine 25 mg, metoprolol 12.5 mg, for now   DVT prophylaxis:Heparin Code Status: Full Family Communication: Granddaughter at bedside 8/8 Disposition Plan: Continue treatment for pneumonia with IV antibiotics Status is: Inpatient Remains inpatient appropriate because: Need for IV medications.   Consultants:  Palliative care  Procedures:  None  Antimicrobials:  Anti-infectives (From admission, onward)    Start     Dose/Rate Route Frequency Ordered Stop   10/02/22 1800  ceFEPIme (MAXIPIME) 2 g in sodium chloride 0.9 % 100 mL IVPB  Status:  Discontinued        2 g 200 mL/hr  over 30 Minutes Intravenous Every 24 hours 10/01/22 1759 10/01/22 2140   10/02/22 1800  cefTRIAXone (ROCEPHIN) 2 g in sodium chloride 0.9 % 100 mL IVPB        2 g 200 mL/hr over 30 Minutes  Intravenous Every 24 hours 10/01/22 2140     10/01/22 2230  doxycycline (VIBRAMYCIN) 100 mg in sodium chloride 0.9 % 250 mL IVPB        100 mg 125 mL/hr over 120 Minutes Intravenous Every 12 hours 10/01/22 2140     10/01/22 1801  vancomycin variable dose per unstable renal function (pharmacist dosing)         Does not apply See admin instructions 10/01/22 1802     10/01/22 1800  ceFEPIme (MAXIPIME) 2 g in sodium chloride 0.9 % 100 mL IVPB        2 g 200 mL/hr over 30 Minutes Intravenous  Once 10/01/22 1746 10/01/22 2036   10/01/22 1800  metroNIDAZOLE (FLAGYL) IVPB 500 mg        500 mg 100 mL/hr over 60 Minutes Intravenous  Once 10/01/22 1746 10/01/22 2139   10/01/22 1800  vancomycin (VANCOCIN) IVPB 1000 mg/200 mL premix        1,000 mg 200 mL/hr over 60 Minutes Intravenous  Once 10/01/22 1746 10/01/22 2323   10/01/22 1745  vancomycin (VANCOCIN) IVPB 1000 mg/200 mL premix  Status:  Discontinued        1,000 mg 200 mL/hr over 60 Minutes Intravenous  Once 10/01/22 1743 10/01/22 1744   10/01/22 1745  ceFEPIme (MAXIPIME) 2 g in sodium chloride 0.9 % 100 mL IVPB  Status:  Discontinued        2 g 200 mL/hr over 30 Minutes Intravenous  Once 10/01/22 1743 10/01/22 1744         Subjective: Patient seen and evaluated today with noted agitation overnight and granddaughter at bedside.  She is not responsive to questioning.  Shortness of breath appears to be improving.  Objective: Vitals:   10/01/22 1934 10/01/22 2121 10/02/22 0032 10/02/22 0315  BP: (!) 101/49 (!) 106/58 (!) 103/59 (!) 96/55  Pulse: 80 84 86 85  Resp: 17 (!) 23  15  Temp: 97.7 F (36.5 C) 98.3 F (36.8 C) 97.9 F (36.6 C) 98 F (36.7 C)  TempSrc: Oral Oral Oral Oral  SpO2: 100% 98% 98% 98%  Weight:  61.4 kg      Intake/Output Summary (Last 24 hours) at 10/02/2022 0810 Last data filed at 10/01/2022 2036 Gross per 24 hour  Intake 100 ml  Output --  Net 100 ml   Filed Weights   10/01/22 1745 10/01/22 2121  Weight:  55 kg 61.4 kg    Examination:  General exam: Appears calm and comfortable, mildly agitated/confused Respiratory system: Clear to auscultation. Respiratory effort normal. Cardiovascular system: S1 & S2 heard, RRR.  Gastrointestinal system: Abdomen is soft Central nervous system: Alert and awake Extremities: No edema Skin: No significant lesions noted    Data Reviewed: I have personally reviewed following labs and imaging studies  CBC: Recent Labs  Lab 10/01/22 1642 10/02/22 0405  WBC 29.6* 29.1*  NEUTROABS 27.8*  --   HGB 7.5* 7.1*  HCT 24.3* 23.2*  MCV 83.5 82.6  PLT 170 165   Basic Metabolic Panel: Recent Labs  Lab 10/01/22 1642 10/02/22 0405  NA 135 136  K 4.8 4.6  CL 103 106  CO2 20* 19*  GLUCOSE 275* 146*  BUN 44* 45*  CREATININE 2.82*  2.75*  CALCIUM 8.8* 8.6*   GFR: Estimated Creatinine Clearance: 12.4 mL/min (A) (by C-G formula based on SCr of 2.75 mg/dL (H)). Liver Function Tests: Recent Labs  Lab 10/01/22 1642  AST 18  ALT 13  ALKPHOS 132*  BILITOT 0.5  PROT 6.4*  ALBUMIN 2.4*   No results for input(s): "LIPASE", "AMYLASE" in the last 168 hours. No results for input(s): "AMMONIA" in the last 168 hours. Coagulation Profile: Recent Labs  Lab 10/01/22 1642  INR 1.7*   Cardiac Enzymes: No results for input(s): "CKTOTAL", "CKMB", "CKMBINDEX", "TROPONINI" in the last 168 hours. BNP (last 3 results) No results for input(s): "PROBNP" in the last 8760 hours. HbA1C: No results for input(s): "HGBA1C" in the last 72 hours. CBG: Recent Labs  Lab 10/01/22 2206  GLUCAP 230*   Lipid Profile: No results for input(s): "CHOL", "HDL", "LDLCALC", "TRIG", "CHOLHDL", "LDLDIRECT" in the last 72 hours. Thyroid Function Tests: No results for input(s): "TSH", "T4TOTAL", "FREET4", "T3FREE", "THYROIDAB" in the last 72 hours. Anemia Panel: No results for input(s): "VITAMINB12", "FOLATE", "FERRITIN", "TIBC", "IRON", "RETICCTPCT" in the last 72 hours. Sepsis  Labs: Recent Labs  Lab 10/01/22 1642 10/01/22 1837  LATICACIDVEN 2.3* 2.0*    Recent Results (from the past 240 hour(s))  Culture, blood (Routine X 2) w Reflex to ID Panel     Status: None (Preliminary result)   Collection Time: 10/01/22  4:42 PM   Specimen: Right Antecubital; Blood  Result Value Ref Range Status   Specimen Description RIGHT ANTECUBITAL  Final   Special Requests   Final    BOTTLES DRAWN AEROBIC AND ANAEROBIC Blood Culture adequate volume   Culture   Final    NO GROWTH < 24 HOURS Performed at [facility], [address] [place], [place] [address]    Report Status PENDING  Incomplete  Culture, blood (Routine X 2) w Reflex to ID Panel     Status: None (Preliminary result)   Collection Time: 10/01/22  4:58 PM   Specimen: Left Antecubital; Blood  Result Value Ref Range Status   Specimen Description LEFT ANTECUBITAL  Final   Special Requests   Final    BOTTLES DRAWN AEROBIC AND ANAEROBIC Blood Culture adequate volume   Culture   Final    NO GROWTH < 24 HOURS Performed at [facility], [address] [place], [place] [address]    Report Status PENDING  Incomplete  Resp panel by RT-PCR (RSV, Flu A&B, Covid) Anterior Nasal Swab     Status: None   Collection Time: 10/01/22  7:40 PM   Specimen: Anterior Nasal Swab  Result Value Ref Range Status   SARS Coronavirus 2 by RT PCR NEGATIVE NEGATIVE Final    Comment: (NOTE) SARS-CoV-2 target nucleic acids are NOT DETECTED.  The SARS-CoV-2 RNA is generally detectable in upper respiratory specimens during the acute phase of infection. The lowest concentration of SARS-CoV-2 viral copies this assay can detect is 138 copies/mL. A negative result does not preclude SARS-Cov-2 infection and should not be used as the sole basis for treatment or other patient management decisions. A negative result may occur with  improper specimen collection/handling, submission of specimen other than nasopharyngeal swab, presence of viral  mutation(s) within the areas targeted by this assay, and inadequate number of viral copies(<138 copies/mL). A negative result must be combined with clinical observations, patient history, and epidemiological information. The expected result is Negative.  Fact Sheet for Patients:  BloggerCourse.com  Fact Sheet for Healthcare Providers:  SeriousBroker.it  This test is no t yet approved or cleared by the [place] and  has been authorized for detection and/or diagnosis of SARS-CoV-2 by FDA under an Emergency Use Authorization (EUA). This EUA will remain  in effect (meaning this test can be used) for the duration of the COVID-19 declaration under Section 564(b)(1) of the Act, 21 U.S.C.section 360bbb-3(b)(1), unless the authorization is terminated  or revoked sooner.       Influenza A by PCR NEGATIVE NEGATIVE Final   Influenza B by PCR NEGATIVE NEGATIVE Final    Comment: (NOTE) The Xpert Xpress SARS-CoV-2/FLU/RSV plus assay is intended as an aid in the diagnosis of influenza from Nasopharyngeal swab specimens and should not be used as a sole basis for treatment. Nasal washings and aspirates are unacceptable for Xpert Xpress SARS-CoV-2/FLU/RSV testing.  Fact Sheet for Patients: BloggerCourse.com  Fact Sheet for Healthcare Providers: SeriousBroker.it  This test is not yet approved or cleared by the [place] FDA and has been authorized for detection and/or diagnosis of SARS-CoV-2 by FDA under an Emergency Use Authorization (EUA). This EUA will remain in effect (meaning this test can be used) for the duration of the COVID-19 declaration under Section 564(b)(1) of the Act, 21 U.S.C. section 360bbb-3(b)(1), unless the authorization is terminated or revoked.     Resp Syncytial Virus by PCR NEGATIVE NEGATIVE Final    Comment: (NOTE) Fact Sheet for  Patients: BloggerCourse.com  Fact Sheet for Healthcare Providers: SeriousBroker.it  This test is not yet approved or cleared by the [place] FDA and has been authorized for detection and/or diagnosis of SARS-CoV-2 by FDA under an Emergency Use Authorization (EUA). This EUA will remain in effect (meaning this test can be used) for the duration of the COVID-19 declaration under Section 564(b)(1) of the Act, 21 U.S.C. section 360bbb-3(b)(1), unless the authorization is terminated or revoked.  Performed at [facility], [address] [place], [place] [address]          Radiology Studies: CT Chest Wo Contrast  Result Date: 10/01/2022 CLINICAL DATA:  Pneumonia, complication suspected, xray done. Abnormal x-ray EXAM: CT CHEST WITHOUT CONTRAST TECHNIQUE: Multidetector CT imaging of the chest was performed following the standard protocol without IV contrast. RADIATION DOSE REDUCTION: This exam was performed according to the departmental dose-optimization program which includes automated exposure control, adjustment of the mA and/or kV according to patient size and/or use of iterative reconstruction technique. COMPARISON:  X-ray today. FINDINGS: Cardiovascular: Cardiomegaly. Diffuse coronary artery and aortic atherosclerosis. No aneurysm. Mediastinum/Nodes: No mediastinal, hilar, or axillary adenopathy. Trachea and esophagus are unremarkable. Thyroid unremarkable. Lungs/Pleura: Small left pleural effusion and moderate right pleural effusion. Bilateral lower lobe airspace opacities dependently, favor atelectasis although is difficult to exclude pneumonia. Ground-glass airspace opacity posteriorly in the right upper lobe also could reflect pneumonia. Upper Abdomen: No acute findings Musculoskeletal: Chest wall soft tissues are unremarkable. No acute bony abnormality. IMPRESSION: Cardiomegaly, diffuse coronary artery disease. Moderate right pleural  effusion and small left pleural effusion. Bilateral lower lobe airspace opacities, mostly dependent. Favor atelectasis although pneumonia cannot be excluded. Ground-glass airspace disease posteriorly in the right upper lobe more concerning for pneumonia. Aortic Atherosclerosis (ICD10-I70.0). Electronically Signed   By: Charlett Nose M.D.   On: 10/01/2022 18:25   US Venous Img Lower  Left (DVT Study)  Result Date: 10/01/2022 CLINICAL DATA:  Swelling and pain. EXAM: LEFT LOWER EXTREMITY VENOUS DOPPLER ULTRASOUND TECHNIQUE: Gray-scale sonography with compression, as well as color and duplex ultrasound, were performed to evaluate  the deep venous system(s) from the level of the common femoral vein through the popliteal and proximal calf veins. COMPARISON:  Left lower extremity venous duplex ultrasound 08/11/2022. FINDINGS: VENOUS Normal compressibility of the common femoral, superficial femoral, and popliteal veins, as well as the visualized calf veins. Visualized portions of profunda femoral vein and great saphenous vein unremarkable. No filling defects to suggest DVT on grayscale or color Doppler imaging. Doppler waveforms show normal direction of venous flow, normal respiratory plasticity and response to augmentation. Limited views of the contralateral common femoral vein are unremarkable. OTHER Left calf edema. Limitations: none IMPRESSION: Negative. Electronically Signed   By: Orvan Falconer M.D.   On: 10/01/2022 16:57   DG Tibia/Fibula Left  Result Date: 10/01/2022 CLINICAL DATA:  Pain and swelling.  Wound. EXAM: LEFT TIBIA AND FIBULA - 2 VIEW COMPARISON:  None Available. FINDINGS: Severe osteopenia. Soft tissue swelling diffusely greatest towards the ankle. Diffuse vascular calcifications. No definite erosive changes. If there is further concern of bone infection, additional workup as clinically directed for further sensitivity such as bone scan or MRI IMPRESSION: Soft tissue swelling.  Severe osteopenia.  Electronically Signed   By: Karen Kays M.D.   On: 10/01/2022 16:23   DG Foot Complete Left  Result Date: 10/01/2022 CLINICAL DATA:  Wound to left foot.  Swelling and pain EXAM: LEFT FOOT - COMPLETE 3 VIEW COMPARISON:  None Available. FINDINGS: Severe osteopenia. No acute fracture or dislocation. Mild degenerative changes of the dorsal aspect of the midfoot. Calcaneal spurs are well corticated. Scattered vascular calcifications. No definite erosive changes. Diffuse soft tissue swelling. However if there is further concern of bone infection, MRI or bone scan may be useful for much higher sensitivity. IMPRESSION: Severe osteopenia with soft tissue swelling and degenerative changes. Electronically Signed   By: Karen Kays M.D.   On: 10/01/2022 16:22   DG Chest Port 1 View  Result Date: 10/01/2022 CLINICAL DATA:  Hypoxia. EXAM: PORTABLE CHEST 1 VIEW COMPARISON:  August 11, 2022. FINDINGS: Mild cardiomegaly with central pulmonary vascular congestion. Stable interstitial densities are noted throughout both lungs which may represent scarring. Right basilar atelectasis or infiltrate is noted with small right pleural effusion. Bony thorax is unremarkable. IMPRESSION: Mild right basilar atelectasis or infiltrate is noted with small right pleural effusion. Mild cardiomegaly with central pulmonary vascular congestion. Stable chronic interstitial densities are noted bilaterally which may represent scarring. Electronically Signed   By: Lupita Raider M.D.   On: 10/01/2022 15:52        Scheduled Meds:  heparin  5,000 Units Subcutaneous Q8H   insulin aspart  0-5 Units Subcutaneous QHS   insulin aspart  0-9 Units Subcutaneous TID WC   levothyroxine  37.5 mcg Oral Q0600   vancomycin variable dose per unstable renal function (pharmacist dosing)   Does not apply See admin instructions   Continuous Infusions:  sodium chloride     sodium chloride 75 mL/hr at 10/01/22 2215   cefTRIAXone (ROCEPHIN)  IV      doxycycline (VIBRAMYCIN) IV 100 mg (10/01/22 2329)     LOS: 1 day    Time spent: 35 minutes     Hoover Brunette, DO [facility]  If 7PM-7AM, please contact night-coverage [email or web address] 10/02/2022, 8:10 AM

## 2022-10-02 NOTE — [place] FL2 (Signed)
[place] MEDICAID FL2 LEVEL OF CARE FORM     IDENTIFICATION  Patient Name: Marie Hunt Birthdate: 1932-09-28 Sex: female Admission Date (Current Location): 10/01/2022  [place] and [place] Number:  Aaron Edelman [id number] Facility and Address:  [facility],  [address] [place] [address]      Provider Number: [id number]  Attending Physician Name and Address:  Erick Blinks, DO  Relative Name and Phone Number:       Current Level of Care: Hospital Recommended Level of Care: Skilled Nursing Facility Prior Approval Number:    Date Approved/Denied:   PASRR Number:    Discharge Plan: SNF    Current Diagnoses: Patient Active Problem List   Diagnosis Date Noted   Multifocal pneumonia 10/01/2022   Left leg cellulitis 10/01/2022   Positive D dimer 08/22/2022   Edema of left lower leg 08/20/2022   History of gout 08/19/2022   Secondary DM with CKD stage 4 and hypertension (HCC) 08/15/2022   Vascular dementia without behavioral disturbance (HCC) 08/15/2022   Hypoalbuminemia due to protein-calorie malnutrition (HCC) 08/12/2022   Acquired hypothyroidism 08/12/2022   Type 2 diabetes mellitus with hyperglycemia (HCC) 08/12/2022   Syncope and collapse 08/11/2022   Chronic combined systolic and diastolic CHF (congestive heart failure) (HCC) 07/07/2022   AKI (acute kidney injury) (HCC) 07/07/2022   Acute hypoxic respiratory failure (HCC) 07/02/2022   GERD without esophagitis 07/02/2022   Thyroid disease 07/02/2022   Prolonged QT interval 07/02/2022   Acute on chronic congestive heart failure (HCC) 07/02/2022   Elevated troponin 07/02/2022   Acute cystitis with hematuria 04/06/2019   Malignant neoplasm of urinary bladder neck (HCC) 04/06/2019   Colitis, acute 04/24/2016   Cough 10/17/2014   Renal insufficiency 10/17/2014   UTI (urinary tract infection) 10/07/2014   Anemia 10/07/2014   Fever    Type 2 diabetes mellitus without complication (HCC)    Hip  fracture (HCC) 10/02/2014   Hip fx (HCC) 10/02/2014   Type 2 diabetes mellitus (HCC)    Fall    Aftercare following surgery of the circulatory system, NEC 10/26/2013   Occlusion and stenosis of carotid artery without mention of cerebral infarction 09/17/2011   Preoperative evaluation to rule out surgical contraindication 11/15/2010   Cardiac murmur 11/15/2010   Essential hypertension 11/08/2010   Carotid artery disease (HCC) 11/08/2010   Hyperlipidemia with target low density lipoprotein (LDL) cholesterol less than 100 mg/dL 57/84/6962   Cholelithiasis 11/08/2010    Orientation RESPIRATION BLADDER Height & Weight     Self  O2 (2L) External catheter Weight: 135 lb 5.8 oz (61.4 kg) Height:     BEHAVIORAL SYMPTOMS/MOOD NEUROLOGICAL BOWEL NUTRITION STATUS      Incontinent Diet (Carb modified. See d/c summary for updates.)  AMBULATORY STATUS COMMUNICATION OF NEEDS Skin   Extensive Assist Verbally Other (Comment) (Redness to left foot. Non-pressure wound left anterior foot with foam dressing.)                       Personal Care Assistance Level of Assistance  Bathing, Feeding, Dressing Bathing Assistance: Maximum assistance Feeding assistance: Limited assistance Dressing Assistance: Maximum assistance     Functional Limitations Info  Sight, Hearing, Speech Sight Info: Adequate Hearing Info: Adequate Speech Info: Adequate    SPECIAL CARE FACTORS FREQUENCY                       Contractures      Additional Factors Info  Code Status, Allergies, Psychotropic Code Status Info: Full code Allergies Info: Codeine Psychotropic Info: Trazodone         Current Medications (10/02/2022):  This is the current hospital active medication list Current Facility-Administered Medications  Medication Dose Route Frequency Provider Last Rate Last Admin   (feeding supplement) PROSource Plus liquid 30 mL  30 mL Oral BID BM Shah, Pratik D, DO       0.9 %  sodium chloride infusion    Intravenous Continuous Emokpae, Ejiroghene E, MD 75 mL/hr at 10/01/22 2215 New Bag at 10/01/22 2215   acetaminophen (TYLENOL) tablet 650 mg  650 mg Oral Q6H PRN Emokpae, Ejiroghene E, MD       Or   acetaminophen (TYLENOL) suppository 650 mg  650 mg Rectal Q6H PRN Emokpae, Ejiroghene E, MD       cefTRIAXone (ROCEPHIN) 2 g in sodium chloride 0.9 % 100 mL IVPB  2 g Intravenous Q24H Emokpae, Ejiroghene E, MD       doxycycline (VIBRAMYCIN) 100 mg in sodium chloride 0.9 % 250 mL IVPB  100 mg Intravenous Q12H Emokpae, Ejiroghene E, MD 125 mL/hr at 10/01/22 2329 100 mg at 10/01/22 2329   feeding supplement (GLUCERNA SHAKE) (GLUCERNA SHAKE) liquid 237 mL  237 mL Oral TID BM Shah, Pratik D, DO       heparin injection 5,000 Units  5,000 Units Subcutaneous Q8H Emokpae, Ejiroghene E, MD   5,000 Units at 10/01/22 2223   insulin aspart (novoLOG) injection 0-5 Units  0-5 Units Subcutaneous QHS Emokpae, Ejiroghene E, MD   2 Units at 10/01/22 2229   insulin aspart (novoLOG) injection 0-9 Units  0-9 Units Subcutaneous TID WC Emokpae, Ejiroghene E, MD       levothyroxine (SYNTHROID) tablet 37.5 mcg  37.5 mcg Oral Q0600 Emokpae, Ejiroghene E, MD       multivitamin with minerals tablet 1 tablet  1 tablet Oral Q lunch Shah, Pratik D, DO       polyethylene glycol (MIRALAX / GLYCOLAX) packet 17 g  17 g Oral Daily PRN Emokpae, Ejiroghene E, MD       vancomycin variable dose per unstable renal function (pharmacist dosing)   Does not apply See admin instructions Emokpae, Ejiroghene E, MD         Discharge Medications: Please see discharge summary for a list of discharge medications.  Relevant Imaging Results:  Relevant Lab Results:   Additional Information    Karn Cassis, LCSW

## 2022-10-02 NOTE — TOC Initial Note (Signed)
Transition of Care [facility]) - Initial/Assessment Note    Patient Details  Name: Marie Hunt MRN: [id number] Date of Birth: [birth year]  Transition of Care [facility] CM/SW Contact:    Karn Cassis, LCSW Phone Number: 10/02/2022, 9:23 AM  Clinical Narrative:  Pt admitted due to multifocal pneumonia. Assessment completed with pt's granddaughter [name]. Pt has been at [facility] for about a month and is LTC. [facility] requests return to [facility] at d/c. Per Tammy, Production designer, theatre/television/film at [facility], okay to return. Authorization not needed due to LTC. TOC will continue to follow.                  Expected Discharge Plan: Skilled Nursing Facility Barriers to Discharge: Continued Medical Work up   Patient Goals and CMS Choice Patient states their goals for this hospitalization and ongoing recovery are:: return to LTC   Choice offered to / list presented to :  (granddaughter)  ownership interest in [facility].provided to::  (n/a)    Expected Discharge Plan and Services In-house Referral: Clinical Social Work   Post Acute Care Choice: Skilled Nursing Facility Living arrangements for the past 2 months: Skilled Nursing Facility                                      Prior Living Arrangements/Services Living arrangements for the past 2 months: Skilled Nursing Facility Lives with:: Facility Resident Patient language and need for interpreter reviewed:: Yes Do you feel safe going back to the place where you live?: Yes      Need for Family Participation in Patient Care: Yes (Comment)     Criminal Activity/Legal Involvement Pertinent to Current Situation/Hospitalization: No - Comment as needed  Activities of Daily Living      Permission Sought/Granted   Permission granted to share information with : Yes, Verbal Permission Granted     Permission granted to share info w AGENCY: [employer] granted to share info w Relationship:  SNF     Emotional Assessment       Orientation: : Oriented to Self Alcohol / Substance Use: Not Applicable Psych Involvement: No (comment)  Admission diagnosis:  Hypoxia [R09.02] Multifocal pneumonia [J18.9] Sepsis, due to unspecified organism, unspecified whether acute organ dysfunction present [facility]) [A41.9] Patient Active Problem List   Diagnosis Date Noted   Multifocal pneumonia 10/01/2022   Left leg cellulitis 10/01/2022   Positive D dimer 08/22/2022   Edema of left lower leg 08/20/2022   History of gout 08/19/2022   Secondary DM with CKD stage 4 and hypertension (HCC) 08/15/2022   Vascular dementia without behavioral disturbance (HCC) 08/15/2022   Hypoalbuminemia due to protein-calorie malnutrition (HCC) 08/12/2022   Acquired hypothyroidism 08/12/2022   Type 2 diabetes mellitus with hyperglycemia (HCC) 08/12/2022   Syncope and collapse 08/11/2022   Chronic combined systolic and diastolic CHF (congestive heart failure) (HCC) 07/07/2022   AKI (acute kidney injury) (HCC) 07/07/2022   Acute hypoxic respiratory failure (HCC) 07/02/2022   GERD without esophagitis 07/02/2022   Thyroid disease 07/02/2022   Prolonged QT interval 07/02/2022   Acute on chronic congestive heart failure (HCC) 07/02/2022   Elevated troponin 07/02/2022   Acute cystitis with hematuria 04/06/2019   Malignant neoplasm of urinary bladder neck (HCC) 04/06/2019   Colitis, acute 04/24/2016   Cough 10/17/2014   Renal insufficiency 10/17/2014   UTI (urinary tract infection) 10/07/2014   Anemia  10/07/2014   Fever    Type 2 diabetes mellitus without complication (HCC)    Hip fracture (HCC) 10/02/2014   Hip fx (HCC) 10/02/2014   Type 2 diabetes mellitus (HCC)    Fall    Aftercare following surgery of the circulatory system, NEC 10/26/2013   Occlusion and stenosis of carotid artery without mention of cerebral infarction 09/17/2011   Preoperative evaluation to rule out surgical contraindication 11/15/2010    Cardiac murmur 11/15/2010   Essential hypertension 11/08/2010   Carotid artery disease (HCC) 11/08/2010   Hyperlipidemia with target low density lipoprotein (LDL) cholesterol less than 100 mg/dL 16/11/9602   Cholelithiasis 11/08/2010   PCP:  Lindaann Pascal Pharmacy:   [facility] [place] - [address] [place] [address] Phone: [phone] Fax: [phone]  [facility] - [place], [place] - [address] [place] [address] Phone: [phone] Fax: [phone]     Social Determinants of Health (SDOH) Social History: SDOH Screenings   Food Insecurity: No Food Insecurity (08/12/2022)  Housing: Patient Declined (08/12/2022)  Transportation Needs: No Transportation Needs (08/12/2022)  Utilities: Not At Risk (08/12/2022)  Tobacco Use: Medium Risk (10/01/2022)   SDOH Interventions:     Readmission Risk Interventions    10/02/2022    9:20 AM  Readmission Risk Prevention Plan  Transportation Screening Complete  Medication Review (RN Care Manager) Complete  HRI or Home Care Consult Complete  SW Recovery Care/Counseling Consult Complete  Palliative Care Screening Not Applicable  Skilled Nursing Facility Complete

## 2022-10-03 ENCOUNTER — Inpatient Hospital Stay (HOSPITAL_COMMUNITY): Payer: 59

## 2022-10-03 DIAGNOSIS — S81802A Unspecified open wound, left lower leg, initial encounter: Secondary | ICD-10-CM

## 2022-10-03 DIAGNOSIS — J189 Pneumonia, unspecified organism: Secondary | ICD-10-CM | POA: Diagnosis not present

## 2022-10-03 DIAGNOSIS — A419 Sepsis, unspecified organism: Secondary | ICD-10-CM | POA: Diagnosis not present

## 2022-10-03 DIAGNOSIS — B950 Streptococcus, group A, as the cause of diseases classified elsewhere: Secondary | ICD-10-CM

## 2022-10-03 LAB — SEDIMENTATION RATE: Sed Rate: 21 mm/hr (ref 0–22)

## 2022-10-03 LAB — C-REACTIVE PROTEIN: CRP: 34.5 mg/dL — ABNORMAL HIGH (ref <1.0)

## 2022-10-03 LAB — TYPE AND SCREEN
ABO/RH(D): O POS
Antibody Screen: NEGATIVE
Unit division: 0

## 2022-10-03 LAB — GRAM STAIN

## 2022-10-03 LAB — BODY FLUID CELL COUNT WITH DIFFERENTIAL
Eos, Fluid: 0 %
Lymphs, Fluid: 72 %
Monocyte-Macrophage-Serous Fluid: 16 % — ABNORMAL LOW (ref 50–90)
Neutrophil Count, Fluid: 12 % (ref 0–25)
Total Nucleated Cell Count, Fluid: 121 cu mm (ref 0–1000)

## 2022-10-03 LAB — BPAM RBC
Blood Product Expiration Date: 202409052359
ISSUE DATE / TIME: 202408090849
Unit Type and Rh: 5100

## 2022-10-03 LAB — GLUCOSE, CAPILLARY
Glucose-Capillary: 143 mg/dL — ABNORMAL HIGH (ref 70–99)
Glucose-Capillary: 124 mg/dL — ABNORMAL HIGH (ref 70–99)
Glucose-Capillary: 161 mg/dL — ABNORMAL HIGH (ref 70–99)
Glucose-Capillary: 152 mg/dL — ABNORMAL HIGH (ref 70–99)

## 2022-10-03 LAB — PREPARE RBC (CROSSMATCH)

## 2022-10-03 LAB — PROTEIN, PLEURAL OR PERITONEAL FLUID: Total protein, fluid: <3 g/dL

## 2022-10-03 LAB — URIC ACID: Uric Acid, Serum: 10 mg/dL — ABNORMAL HIGH (ref 2.5–7.1)

## 2022-10-03 LAB — GLUCOSE, PLEURAL OR PERITONEAL FLUID: Glucose, Fluid: 137 mg/dL

## 2022-10-03 LAB — HEMOGLOBIN AND HEMATOCRIT, BLOOD
HCT: 25.4 % — ABNORMAL LOW (ref 36.0–46.0)
Hemoglobin: 8.1 g/dL — ABNORMAL LOW (ref 12.0–15.0)

## 2022-10-03 MED ORDER — SODIUM CHLORIDE 0.9 % IV SOLN: 3.0000 g | Freq: Two times a day (BID) | INTRAVENOUS | Status: DC

## 2022-10-03 MED ORDER — METHYLPREDNISOLONE SODIUM SUCC 40 MG IJ SOLR
20.0000 mg | Freq: Every day | INTRAMUSCULAR | Status: DC
  Administered 2022-10-03 – 2022-10-06 (×4): 20 mg via INTRAVENOUS
  Filled 2022-10-03 (×4): qty 1

## 2022-10-03 MED ORDER — SODIUM CHLORIDE 0.9% IV SOLUTION: Freq: Once | INTRAVENOUS | Status: AC

## 2022-10-03 MED ORDER — PENICILLIN G POTASSIUM 20000000 UNITS IJ SOLR
4.0000 10*6.[IU] | Freq: Three times a day (TID) | INTRAVENOUS | Status: DC
  Administered 2022-10-03 – 2022-10-09 (×19): 4 10*6.[IU] via INTRAVENOUS
  Filled 2022-10-03 (×23): qty 4

## 2022-10-03 MED ORDER — LIDOCAINE HCL (PF) 2 % IJ SOLN
10.0000 mL | Freq: Once | INTRAMUSCULAR | Status: AC
  Administered 2022-10-03: 10 mL via INTRADERMAL
  Filled 2022-10-03: qty 10

## 2022-10-03 MED ORDER — PANTOPRAZOLE SODIUM 40 MG IV SOLR: 40.0000 mg | Freq: Every day | INTRAVENOUS | Status: DC

## 2022-10-03 MED ORDER — LIDOCAINE HCL (PF) 2 % IJ SOLN
INTRAMUSCULAR | Status: AC
  Filled 2022-10-03: qty 10

## 2022-10-03 MED ORDER — LORAZEPAM 2 MG/ML IJ SOLN
1.0000 mg | Freq: Once | INTRAMUSCULAR | Status: AC
  Administered 2022-10-03: 1 mg via INTRAVENOUS
  Filled 2022-10-03: qty 1

## 2022-10-03 MED ORDER — PANTOPRAZOLE SODIUM 40 MG IV SOLR
40.0000 mg | Freq: Every day | INTRAVENOUS | Status: DC
  Administered 2022-10-04 – 2022-10-08 (×5): 40 mg via INTRAVENOUS
  Filled 2022-10-03 (×5): qty 10

## 2022-10-03 MED ORDER — LINEZOLID 600 MG PO TABS
600.0000 mg | ORAL_TABLET | Freq: Two times a day (BID) | ORAL | Status: DC
  Administered 2022-10-03: 600 mg via ORAL
  Filled 2022-10-03 (×4): qty 1

## 2022-10-03 NOTE — Consult Note (Signed)
Virtual Visit via Telephone/Video Note   I connected with Marie Hunt   On 10/03/2022 at 8:38 PM  by Video and verified that I am speaking with the correct person using two identifiers.   I discussed the limitations, risks, security and privacy concerns of performing an evaluation and management service by telephone and the availability of in person appointments. I also discussed with the patient that there may be a patient responsible charge related to this service. The patient expressed understanding and agreed to proceed.   Location:   Patient: Marie Hunt Provider: [facility]    Date of Admission:  10/01/2022   Total days of inpatient antibiotics 2        Reason for Consult: Strep pyogenous bacteremia    Principal Problem:   Multifocal pneumonia Active Problems:   Essential hypertension   Type 2 diabetes mellitus (HCC)   Acute hypoxic respiratory failure (HCC)   Chronic combined systolic and diastolic CHF (congestive heart failure) (HCC)   Acquired hypothyroidism   Secondary DM with CKD stage 4 and hypertension (HCC)   Vascular dementia without behavioral disturbance (HCC)   History of gout   Left leg cellulitis   Assessment: 89 year old female admitted with: #Group A strep bacteremia secondary to left lower extremity wound #Concern for pneumonia versus atelectasis vs fluid overload #Sepsis secondary to #1 - Patient presented with staff from worsening chronic cough and left lower extremity pain.  CT chest showed possible pneumonia in the right upper lobe noted as groundglass opacity.  CT left lower extremity shows subcutaneous edema of foot and ankle without fluid collection or osteomyelitis.  Possible second transmetatarsal joint gout. - Granddaughter was present during video visit, communicated with.  She states that patient ambulates at baseline.  She noted that while she has been at the staff she has had a wound for  about a month.  She sees podiatry outpatient.  She says patient during admission is not her baseline.  Recommendations:  -Repeat blood cultures -TTE - Monitor clinically - DC doxycycline and Unasyn - Start penicillin and linezolid(antitoxin coverage to target bacteremia -Patient underwent thoracentesis yielding 350 mL clear yellow fluid.  Cell count noted 121 WBC, 72% lymphs, 12% neutrophils 16% monocytes.  AFB and bacterial cultures are pending.  I suspect her worsening chronic cough is likely secondary to volume overload rather than bacterial PNA given repeat chest x-ray showed cardiomegaly with mild pulmonary vascular congestion. Microbiology:   Antibiotics: Unasyn 8/8 Cefepime 8/7 Doxy 8/7-8/8 Metronidazole 8/7 Vancomycin 8/7  Cultures: Blood 1/2 group A strep 8/8 NG    HPI: Marie Hunt is a 89 y.o. female  with medical history significant for systolic nad diastolic CHF, DM. Dementia, HTN admitted with pain at left foot wound, unable to ambualte. Noted worsening chrrnic cough.  CT showed moderate right pleural effusion and small left pleural effusion. B.L LL airspace opacities, most dependent. Favor atelectasis although PNA not excluded. Ground glass airspace disease RUL PNA.  CT left lower extremity showed subcutaneous edema around foot and ankle, without focal fluid collection.  Prominent erosion around dorsal aspect second transmetatarsal distal joint possible gout.  No evidence of osteomyelitis.  ID auto consulted due to strep a bacteremia.  Review of Systems: Review of Systems  All other systems reviewed and are negative.   Past Medical History:  Diagnosis Date   Anemia    Carotid artery disease (HCC)    1-39% RICA,  patent LICA 7/12 - Dr. Edilia Bo   Cholelithiasis    Cholelithiasis    In need of cholecystectomy   Diabetes mellitus    Type II   Essential hypertension, benign    GERD (gastroesophageal reflux disease)    GERD (gastroesophageal reflux disease)     Mixed hyperlipidemia    Sigmoid diverticulosis    Sigmoid diverticulosis    Type 2 diabetes mellitus (HCC)     Social History   Tobacco Use   Smoking status: Former    Current packs/day: 0.00    Average packs/day: 0.3 packs/day for 60.0 years (18.0 ttl pk-yrs)    Types: Cigarettes    Start date: 10/27/1951    Quit date: 10/27/2011    Years since quitting: 10.9   Smokeless tobacco: Never  Substance Use Topics   Alcohol use: No    Alcohol/week: 0.0 standard drinks of alcohol   Drug use: No    Family History  Problem Relation Age of Onset   Diabetes Mother    Diabetes Father    Heart attack Daughter    Other Daughter        Bleeding problems   Cancer Daughter    Coronary artery disease Other    Heart disease Sister    Diabetes Sister    Hyperlipidemia Sister    Hypertension Sister    Heart disease Sister    Heart disease Sister    Scheduled Meds:  (feeding supplement) PROSource Plus  30 mL Oral BID BM   feeding supplement (GLUCERNA SHAKE)  237 mL Oral TID BM   heparin  5,000 Units Subcutaneous Q8H   insulin aspart  0-5 Units Subcutaneous QHS   insulin aspart  0-9 Units Subcutaneous TID WC   levothyroxine  37.5 mcg Oral Q0600   linezolid  600 mg Oral Q12H   methylPREDNISolone (SOLU-MEDROL) injection  20 mg Intravenous Daily   multivitamin with minerals  1 tablet Oral Q lunch   pantoprazole (PROTONIX) IV  40 mg Intravenous QAC breakfast   Continuous Infusions:  pencillin G potassium IV 4 Million Units (10/03/22 2100)   PRN Meds:.acetaminophen **OR** acetaminophen, fentaNYL (SUBLIMAZE) injection, oxyCODONE, polyethylene glycol Allergies  Allergen Reactions   Codeine Nausea And Vomiting    Lab Results Lab Results  Component Value Date   WBC 27.1 (H) 10/03/2022   HGB 8.1 (L) 10/03/2022   HCT 25.4 (L) 10/03/2022   MCV 81.6 10/03/2022   PLT 156 10/03/2022    Lab Results  Component Value Date   CREATININE 2.99 (H) 10/03/2022   BUN 49 (H) 10/03/2022   NA  137 10/03/2022   K 4.5 10/03/2022   CL 109 10/03/2022   CO2 18 (L) 10/03/2022    Lab Results  Component Value Date   ALT 13 10/01/2022   AST 18 10/01/2022   ALKPHOS 132 (H) 10/01/2022   BILITOT 0.5 10/01/2022       Danelle Earthly, MD [facility] 10/03/2022, 8:37 PM    I have personally spent 82 minutes involved in face-to-face and non-face-to-face activities for this patient on the day of the visit. Professional time spent includes the following activities: Preparing to see the patient (review of tests), Obtaining and/or reviewing separately obtained history (admission/discharge record), Performing a medically appropriate examination and/or evaluation , Ordering medications/tests/procedures, referring and communicating with other health care professionals, Documenting clinical information in the EMR, Independently interpreting results (not separately reported), Communicating results to the patient/family/caregiver, Counseling and educating the  patient/family/caregiver and Care coordination (not separately reported).

## 2022-10-03 NOTE — Care Management Important Message (Signed)
Important Message  Patient Details  Name: Marie Hunt MRN: [id number] Date of Birth: 07-Jan-1933   Medicare Important Message Given:  Yes     Corey Harold 10/03/2022, 10:44 AM

## 2022-10-03 NOTE — Progress Notes (Signed)
Critical lab result obtained and reported to Dr. Antionette Char. No signs of bleeding noted. Orders are to transfuse 1 unit RBC's.

## 2022-10-03 NOTE — Progress Notes (Signed)
PROGRESS NOTE    Marie Hunt  WGN:[id number] DOB: 06-Apr-1932 DOA: 10/01/2022 PCP: Lindaann Pascal   Brief Narrative:    Marie Hunt is a 89 y.o. female with medical history significant for systolic and diastolic CHF, diabetes mellitus, dementia, hypertension. Patient was brought to the ED via EMS for reports of pain to the left foot, patient unable to ambulate.  She has also noticed increased work of breathing from her baseline as well as worsening chronic cough.  She was admitted for multifocal pneumonia with bilateral pleural effusions and started on IV antibiotics.  Assessment & Plan:   Principal Problem:   Multifocal pneumonia Active Problems:   Acute hypoxic respiratory failure (HCC)   Left leg cellulitis   Type 2 diabetes mellitus (HCC)   Chronic combined systolic and diastolic CHF (congestive heart failure) (HCC)   Secondary DM with CKD stage 4 and hypertension (HCC)   Vascular dementia without behavioral disturbance (HCC)   History of gout   Essential hypertension   Acquired hypothyroidism  Assessment and Plan:    Multifocal pneumonia with strep bacteremia Pneumonia with acute hypoxic respiratory failure.  CTA chest showing multifocal pneumonia, also with moderate right and small left pleural effusion.  Significant leukocytosis of 29.6.  Lactic acid 2.3 > 2. -IV penicillin G and linezolid per ID -Need to confirm indication for Eliquis, no documentation in chart- (Eliquis held for now pending Ct findings) called patient's granddaughter Shirlean Mylar back to ask, no response. -Plan for thoracentesis with ultrasound today -Appreciate ID recommendations   Left leg cellulitis Increasing pain, tenderness on exam, with swelling and erythema, chronic nonhealing ulcer with minimal drainage. Significant leukocytosis of 29.6.  History of gout.  Rules out for sepsis. - Obtain CT of the left foot considering significant leukocytosis -Continue on penicillin G and linezolid per  ID  Acute on chronic anemia No overt bleeding identified Plan to transfuse 1 unit PRBC 8/9  Acute hypoxic respiratory failure (HCC) O2 sats down to 85% on room air, likely due to multifocal pneumonia, and moderate right pleural effusion, small left pleural effusion.   Possible acute gout Left foot swelling, likely cellulitis, but also possibly gout.  Not on medication for gout. -No longer on chronic medications -Check uric acid, ESR, and CRP and start IV Solu-Medrol empirically     Vascular dementia without behavioral disturbance (HCC) Stable.  Nursing home resident.  Baseline most times able to recognize family, answer simple questions, significant short-term memory loss.   Secondary DM with CKD stage 4 and hypertension (HCC) Creatinine starting to elevate, but should improve with PRBC transfusion Monitor strict I's and O's Avoid nephrotoxic agents   Chronic combined systolic and diastolic CHF (congestive heart failure) (HCC) No peripheral signs of volume overload.  CT showing pleural effusions.   Type 2 diabetes mellitus (HCC) A1c 7.7. - SSI- S -Hold linagliptin   Acquired hypothyroidism Resume Synthroid   Essential hypertension Blood pressure soft  -systolics 96-109. -Hold Lasix 40 mg, hydralazine 25 mg, metoprolol 12.5 mg, for now   DVT prophylaxis:Heparin Code Status: Full Family Communication: Granddaughter at bedside 8/9 Disposition Plan: Continue treatment for pneumonia with IV antibiotics Status is: Inpatient Remains inpatient appropriate because: Need for IV medications.   Consultants:  Palliative care ID  Procedures:  None  Antimicrobials:  Anti-infectives (From admission, onward)    Start     Dose/Rate Route Frequency Ordered Stop   10/03/22 2200  vancomycin (VANCOREADY) IVPB 750 mg/150 mL  Status:  Discontinued  750 mg 150 mL/hr over 60 Minutes Intravenous Every 48 hours 10/02/22 1041 10/02/22 1507   10/03/22 1245  linezolid (ZYVOX)  tablet 600 mg        600 mg Oral Every 12 hours 10/03/22 1151 10/05/22 0959   10/03/22 1200  Ampicillin-Sulbactam (UNASYN) 3 g in sodium chloride 0.9 % 100 mL IVPB  Status:  Discontinued        3 g 200 mL/hr over 30 Minutes Intravenous Every 12 hours 10/03/22 1058 10/03/22 1151   10/03/22 1200  penicillin G potassium 4 Million Units in dextrose 5 % 250 mL IVPB        4 Million Units 250 mL/hr over 60 Minutes Intravenous Every 8 hours 10/03/22 1151     10/02/22 1800  ceFEPIme (MAXIPIME) 2 g in sodium chloride 0.9 % 100 mL IVPB  Status:  Discontinued        2 g 200 mL/hr over 30 Minutes Intravenous Every 24 hours 10/01/22 1759 10/01/22 2140   10/02/22 1800  cefTRIAXone (ROCEPHIN) 2 g in sodium chloride 0.9 % 100 mL IVPB  Status:  Discontinued        2 g 200 mL/hr over 30 Minutes Intravenous Every 24 hours 10/01/22 2140 10/02/22 1507   10/02/22 1700  Ampicillin-Sulbactam (UNASYN) 3 g in sodium chloride 0.9 % 100 mL IVPB  Status:  Discontinued        3 g 200 mL/hr over 30 Minutes Intravenous Every 24 hours 10/02/22 1510 10/03/22 1058   10/01/22 2230  doxycycline (VIBRAMYCIN) 100 mg in sodium chloride 0.9 % 250 mL IVPB  Status:  Discontinued        100 mg 125 mL/hr over 120 Minutes Intravenous Every 12 hours 10/01/22 2140 10/02/22 1507   10/01/22 1801  vancomycin variable dose per unstable renal function (pharmacist dosing)  Status:  Discontinued         Does not apply See admin instructions 10/01/22 1802 10/02/22 1507   10/01/22 1800  ceFEPIme (MAXIPIME) 2 g in sodium chloride 0.9 % 100 mL IVPB        2 g 200 mL/hr over 30 Minutes Intravenous  Once 10/01/22 1746 10/01/22 2036   10/01/22 1800  metroNIDAZOLE (FLAGYL) IVPB 500 mg        500 mg 100 mL/hr over 60 Minutes Intravenous  Once 10/01/22 1746 10/01/22 2139   10/01/22 1800  vancomycin (VANCOCIN) IVPB 1000 mg/200 mL premix        1,000 mg 200 mL/hr over 60 Minutes Intravenous  Once 10/01/22 1746 10/01/22 2323   10/01/22 1745  vancomycin  (VANCOCIN) IVPB 1000 mg/200 mL premix  Status:  Discontinued        1,000 mg 200 mL/hr over 60 Minutes Intravenous  Once 10/01/22 1743 10/01/22 1744   10/01/22 1745  ceFEPIme (MAXIPIME) 2 g in sodium chloride 0.9 % 100 mL IVPB  Status:  Discontinued        2 g 200 mL/hr over 30 Minutes Intravenous  Once 10/01/22 1743 10/01/22 1744         Subjective: Patient seen and evaluated today with ongoing discomfort and pain.  She is noted to have lower hemoglobin levels today and requires 1 unit PRBC transfusion which was ordered overnight.  There is concerns that she may have acute gout.  Objective: Vitals:   10/03/22 0919 10/03/22 0922 10/03/22 1031 10/03/22 1126  BP: (!) 94/53 (!) 94/55 (!) 101/38 (!) 94/46  Pulse:      Resp:  18  17  Temp:  98.5 F (36.9 C)  98.6 F (37 C)  TempSrc:  Oral  Oral  SpO2:  98%  98%  Weight:        Intake/Output Summary (Last 24 hours) at 10/03/2022 1202 Last data filed at 10/03/2022 1126 Gross per 24 hour  Intake 1040.49 ml  Output 225 ml  Net 815.49 ml   Filed Weights   10/01/22 1745 10/01/22 2121  Weight: 55 kg 61.4 kg    Examination:  General exam: Appears calm and comfortable, mildly agitated/confused Respiratory system: Clear to auscultation. Respiratory effort normal. Cardiovascular system: S1 & S2 heard, RRR.  Gastrointestinal system: Abdomen is soft Central nervous system: Alert and awake Extremities: No edema Skin: No significant lesions noted    Data Reviewed: I have personally reviewed following labs and imaging studies  CBC: Recent Labs  Lab 10/01/22 1642 10/02/22 0405 10/03/22 0413  WBC 29.6* 29.1* 27.1*  NEUTROABS 27.8*  --   --   HGB 7.5* 7.1* 6.4*  HCT 24.3* 23.2* 20.9*  MCV 83.5 82.6 81.6  PLT 170 165 156   Basic Metabolic Panel: Recent Labs  Lab 10/01/22 1642 10/02/22 0405 10/03/22 0413  NA 135 136 137  K 4.8 4.6 4.5  CL 103 106 109  CO2 20* 19* 18*  GLUCOSE 275* 146* 138*  BUN 44* 45* 49*   CREATININE 2.82* 2.75* 2.99*  CALCIUM 8.8* 8.6* 8.4*  MG  --   --  2.0   GFR: Estimated Creatinine Clearance: 11.4 mL/min (A) (by C-G formula based on SCr of 2.99 mg/dL (H)). Liver Function Tests: Recent Labs  Lab 10/01/22 1642  AST 18  ALT 13  ALKPHOS 132*  BILITOT 0.5  PROT 6.4*  ALBUMIN 2.4*   No results for input(s): "LIPASE", "AMYLASE" in the last 168 hours. No results for input(s): "AMMONIA" in the last 168 hours. Coagulation Profile: Recent Labs  Lab 10/01/22 1642  INR 1.7*   Cardiac Enzymes: No results for input(s): "CKTOTAL", "CKMB", "CKMBINDEX", "TROPONINI" in the last 168 hours. BNP (last 3 results) No results for input(s): "PROBNP" in the last 8760 hours. HbA1C: No results for input(s): "HGBA1C" in the last 72 hours. CBG: Recent Labs  Lab 10/02/22 1116 10/02/22 1607 10/02/22 2126 10/03/22 0729 10/03/22 1101  GLUCAP 147* 144* 132* 143* 124*   Lipid Profile: No results for input(s): "CHOL", "HDL", "LDLCALC", "TRIG", "CHOLHDL", "LDLDIRECT" in the last 72 hours. Thyroid Function Tests: No results for input(s): "TSH", "T4TOTAL", "FREET4", "T3FREE", "THYROIDAB" in the last 72 hours. Anemia Panel: No results for input(s): "VITAMINB12", "FOLATE", "FERRITIN", "TIBC", "IRON", "RETICCTPCT" in the last 72 hours. Sepsis Labs: Recent Labs  Lab 10/01/22 1642 10/01/22 1837  LATICACIDVEN 2.3* 2.0*    Recent Results (from the past 240 hour(s))  Culture, blood (Routine X 2) w Reflex to ID Panel     Status: None (Preliminary result)   Collection Time: 10/01/22  4:42 PM   Specimen: Right Antecubital; Blood  Result Value Ref Range Status   Specimen Description RIGHT ANTECUBITAL  Final   Special Requests   Final    BOTTLES DRAWN AEROBIC AND ANAEROBIC Blood Culture adequate volume   Culture   Final    NO GROWTH 2 DAYS Performed at [facility], [address] [place], [place] [address]    Report Status PENDING  Incomplete  Culture, blood (Routine X 2) w  Reflex to ID Panel     Status: Abnormal (Preliminary result)   Collection Time: 10/01/22  4:58 PM  Specimen: Left Antecubital; Blood  Result Value Ref Range Status   Specimen Description   Final    LEFT ANTECUBITAL Performed at [facility], [address] [place], [place] [address]    Special Requests   Final    BOTTLES DRAWN AEROBIC AND ANAEROBIC Blood Culture adequate volume Performed at [facility], [address] [place], [place] [address]    Culture  Setup Time   Final    GRAM POSITIVE COCCI IN CHAINS IN BOTH AEROBIC AND ANAEROBIC BOTTLES Gram Stain Report Called to,Read Back By and Verified With: CATES LAND, MICHELLE @ 0900 ON 10/02/2022 BY FRATTO,ASHLEY CRITICAL RESULT CALLED TO, READ BACK BY AND VERIFIED WITH: PHARMD WILL Dareen Piano [id number] AT 1401 BY CM    Culture (A)  Final    GROUP A STREP (S.PYOGENES) ISOLATED SUSCEPTIBILITIES TO FOLLOW Performed at [facility], [address], [place], [place] [address]    Report Status PENDING  Incomplete  Blood Culture ID Panel (Reflexed)     Status: Abnormal   Collection Time: 10/01/22  4:58 PM  Result Value Ref Range Status   Enterococcus faecalis NOT DETECTED NOT DETECTED Final   Enterococcus Faecium NOT DETECTED NOT DETECTED Final   Listeria monocytogenes NOT DETECTED NOT DETECTED Final   Staphylococcus species NOT DETECTED NOT DETECTED Final   Staphylococcus aureus (BCID) NOT DETECTED NOT DETECTED Final   Staphylococcus epidermidis NOT DETECTED NOT DETECTED Final   Staphylococcus lugdunensis NOT DETECTED NOT DETECTED Final   Streptococcus species DETECTED (A) NOT DETECTED Final    Comment: CRITICAL RESULT CALLED TO, READ BACK BY AND VERIFIED WITH: PHARMD WILL Dareen Piano [id number] AT 1401 BY CM    Streptococcus agalactiae NOT DETECTED NOT DETECTED Final   Streptococcus pneumoniae NOT DETECTED NOT DETECTED Final   Streptococcus pyogenes DETECTED (A) NOT DETECTED Final    Comment: CRITICAL RESULT CALLED TO, READ BACK BY AND  VERIFIED WITH: PHARMD WILL Dareen Piano [id number] AT 1401 BY CM    A.calcoaceticus-baumannii NOT DETECTED NOT DETECTED Final   Bacteroides fragilis NOT DETECTED NOT DETECTED Final   Enterobacterales NOT DETECTED NOT DETECTED Final   Enterobacter cloacae complex NOT DETECTED NOT DETECTED Final   Escherichia coli NOT DETECTED NOT DETECTED Final   Klebsiella aerogenes NOT DETECTED NOT DETECTED Final   Klebsiella oxytoca NOT DETECTED NOT DETECTED Final   Klebsiella pneumoniae NOT DETECTED NOT DETECTED Final   Proteus species NOT DETECTED NOT DETECTED Final   Salmonella species NOT DETECTED NOT DETECTED Final   Serratia marcescens NOT DETECTED NOT DETECTED Final   Haemophilus influenzae NOT DETECTED NOT DETECTED Final   Neisseria meningitidis NOT DETECTED NOT DETECTED Final   Pseudomonas aeruginosa NOT DETECTED NOT DETECTED Final   Stenotrophomonas maltophilia NOT DETECTED NOT DETECTED Final   Candida albicans NOT DETECTED NOT DETECTED Final   Candida auris NOT DETECTED NOT DETECTED Final   Candida glabrata NOT DETECTED NOT DETECTED Final   Candida krusei NOT DETECTED NOT DETECTED Final   Candida parapsilosis NOT DETECTED NOT DETECTED Final   Candida tropicalis NOT DETECTED NOT DETECTED Final   Cryptococcus neoformans/gattii NOT DETECTED NOT DETECTED Final    Comment: Performed at [facility], [address] [place], [place] [address]  Resp panel by RT-PCR (RSV, Flu A&B, Covid) Anterior Nasal Swab     Status: None   Collection Time: 10/01/22  7:40 PM   Specimen: Anterior Nasal Swab  Result Value Ref Range Status   SARS Coronavirus 2 by RT PCR NEGATIVE NEGATIVE Final  Comment: (NOTE) SARS-CoV-2 target nucleic acids are NOT DETECTED.  The SARS-CoV-2 RNA is generally detectable in upper respiratory specimens during the acute phase of infection. The lowest concentration of SARS-CoV-2 viral copies this assay can detect is 138 copies/mL. A negative result does not preclude  SARS-Cov-2 infection and should not be used as the sole basis for treatment or other patient management decisions. A negative result may occur with  improper specimen collection/handling, submission of specimen other than nasopharyngeal swab, presence of viral mutation(s) within the areas targeted by this assay, and inadequate number of viral copies(<138 copies/mL). A negative result must be combined with clinical observations, patient history, and epidemiological information. The expected result is Negative.  Fact Sheet for Patients:  BloggerCourse.com  Fact Sheet for Healthcare Providers:  SeriousBroker.it  This test is no t yet approved or cleared by the [place] FDA and  has been authorized for detection and/or diagnosis of SARS-CoV-2 by FDA under an Emergency Use Authorization (EUA). This EUA will remain  in effect (meaning this test can be used) for the duration of the COVID-19 declaration under Section 564(b)(1) of the Act, 21 U.S.C.section 360bbb-3(b)(1), unless the authorization is terminated  or revoked sooner.       Influenza A by PCR NEGATIVE NEGATIVE Final   Influenza B by PCR NEGATIVE NEGATIVE Final    Comment: (NOTE) The Xpert Xpress SARS-CoV-2/FLU/RSV plus assay is intended as an aid in the diagnosis of influenza from Nasopharyngeal swab specimens and should not be used as a sole basis for treatment. Nasal washings and aspirates are unacceptable for Xpert Xpress SARS-CoV-2/FLU/RSV testing.  Fact Sheet for Patients: BloggerCourse.com  Fact Sheet for Healthcare Providers: SeriousBroker.it  This test is not yet approved or cleared by the [place] FDA and has been authorized for detection and/or diagnosis of SARS-CoV-2 by FDA under an Emergency Use Authorization (EUA). This EUA will remain in effect (meaning this test can be used) for the duration of  the COVID-19 declaration under Section 564(b)(1) of the Act, 21 U.S.C. section 360bbb-3(b)(1), unless the authorization is terminated or revoked.     Resp Syncytial Virus by PCR NEGATIVE NEGATIVE Final    Comment: (NOTE) Fact Sheet for Patients: BloggerCourse.com  Fact Sheet for Healthcare Providers: SeriousBroker.it  This test is not yet approved or cleared by the [place] FDA and has been authorized for detection and/or diagnosis of SARS-CoV-2 by FDA under an Emergency Use Authorization (EUA). This EUA will remain in effect (meaning this test can be used) for the duration of the COVID-19 declaration under Section 564(b)(1) of the Act, 21 U.S.C. section 360bbb-3(b)(1), unless the authorization is terminated or revoked.  Performed at [facility], [address] [place], [place] [address]   Culture, blood (Routine X 2) w Reflex to ID Panel     Status: None (Preliminary result)   Collection Time: 10/02/22 10:01 AM   Specimen: BLOOD  Result Value Ref Range Status   Specimen Description BLOOD BLOOD RIGHT WRIST  Final   Special Requests   Final    BOTTLES DRAWN AEROBIC AND ANAEROBIC Blood Culture adequate volume   Culture   Final    NO GROWTH < 24 HOURS Performed at [facility], [address] [place], [place] [address]    Report Status PENDING  Incomplete  Culture, blood (Routine X 2) w Reflex to ID Panel     Status: None (Preliminary result)   Collection Time: 10/02/22 10:03 AM   Specimen: BLOOD  Result Value Ref Range  Status   Specimen Description BLOOD BLOOD LEFT HAND  Final   Special Requests   Final    BOTTLES DRAWN AEROBIC AND ANAEROBIC Blood Culture adequate volume   Culture   Final    NO GROWTH < 24 HOURS Performed at [facility], [address], [place], [place] [address]    Report Status PENDING  Incomplete         Radiology Studies: CT FOOT LEFT WO CONTRAST  Result Date: 10/02/2022 CLINICAL DATA:   Left foot pain and swelling. Leukocytosis. History of diabetes. EXAM: CT OF THE LEFT FOOT WITHOUT CONTRAST TECHNIQUE: Multidetector CT imaging of the left foot was performed according to the standard protocol. Multiplanar CT image reconstructions were also generated. RADIATION DOSE REDUCTION: This exam was performed according to the departmental dose-optimization program which includes automated exposure control, adjustment of the mA and/or kV according to patient size and/or use of iterative reconstruction technique. COMPARISON:  Left foot radiographs 10/01/2022. No other relevant comparison imaging. FINDINGS: Bones/Joint/Cartilage The bones are diffusely demineralized. There is no evidence of acute fracture or dislocation. There are prominent erosions along the dorsal aspect of the 2nd tarsal metatarsal joint. These have sclerotic margins and may relate to underlying gout. No apparent overlying soft tissue swelling in this area. Additional mild subchondral cyst formation proximally in the medial cuneiform. Minimal degenerative changes at the 1st MTP joint. No bone destruction identified to suggest osteomyelitis. Ligaments Suboptimally assessed by CT. Muscles and Tendons As evaluated by CT, the ankle tendons appear intact without significant tenosynovitis. No focal muscular abnormalities are seen. Soft tissues Nonspecific generalized subcutaneous edema throughout the foot and ankle. No focal fluid collection, foreign body or soft tissue emphysema identified. There are scattered vascular calcifications, typical of diabetes. IMPRESSION: 1. Nonspecific generalized subcutaneous edema throughout the foot and ankle without focal fluid collection, foreign body or soft tissue emphysema. 2. No evidence of osteomyelitis or acute fracture. 3. Prominent erosions along the dorsal aspect of the 2nd tarsometatarsal joint with sclerotic margins, possibly related to underlying gout. Correlate clinically. Electronically Signed   By:  Carey Bullocks M.D.   On: 10/02/2022 12:52   CT Chest Wo Contrast  Result Date: 10/01/2022 CLINICAL DATA:  Pneumonia, complication suspected, xray done. Abnormal x-ray EXAM: CT CHEST WITHOUT CONTRAST TECHNIQUE: Multidetector CT imaging of the chest was performed following the standard protocol without IV contrast. RADIATION DOSE REDUCTION: This exam was performed according to the departmental dose-optimization program which includes automated exposure control, adjustment of the mA and/or kV according to patient size and/or use of iterative reconstruction technique. COMPARISON:  X-ray today. FINDINGS: Cardiovascular: Cardiomegaly. Diffuse coronary artery and aortic atherosclerosis. No aneurysm. Mediastinum/Nodes: No mediastinal, hilar, or axillary adenopathy. Trachea and esophagus are unremarkable. Thyroid unremarkable. Lungs/Pleura: Small left pleural effusion and moderate right pleural effusion. Bilateral lower lobe airspace opacities dependently, favor atelectasis although is difficult to exclude pneumonia. Ground-glass airspace opacity posteriorly in the right upper lobe also could reflect pneumonia. Upper Abdomen: No acute findings Musculoskeletal: Chest wall soft tissues are unremarkable. No acute bony abnormality. IMPRESSION: Cardiomegaly, diffuse coronary artery disease. Moderate right pleural effusion and small left pleural effusion. Bilateral lower lobe airspace opacities, mostly dependent. Favor atelectasis although pneumonia cannot be excluded. Ground-glass airspace disease posteriorly in the right upper lobe more concerning for pneumonia. Aortic Atherosclerosis (ICD10-I70.0). Electronically Signed   By: Charlett Nose M.D.   On: 10/01/2022 18:25   US Venous Img Lower  Left (DVT Study)  Result Date: 10/01/2022 CLINICAL DATA:  Swelling  and pain. EXAM: LEFT LOWER EXTREMITY VENOUS DOPPLER ULTRASOUND TECHNIQUE: Gray-scale sonography with compression, as well as color and duplex ultrasound, were performed  to evaluate the deep venous system(s) from the level of the common femoral vein through the popliteal and proximal calf veins. COMPARISON:  Left lower extremity venous duplex ultrasound 08/11/2022. FINDINGS: VENOUS Normal compressibility of the common femoral, superficial femoral, and popliteal veins, as well as the visualized calf veins. Visualized portions of profunda femoral vein and great saphenous vein unremarkable. No filling defects to suggest DVT on grayscale or color Doppler imaging. Doppler waveforms show normal direction of venous flow, normal respiratory plasticity and response to augmentation. Limited views of the contralateral common femoral vein are unremarkable. OTHER Left calf edema. Limitations: none IMPRESSION: Negative. Electronically Signed   By: Orvan Falconer M.D.   On: 10/01/2022 16:57   DG Tibia/Fibula Left  Result Date: 10/01/2022 CLINICAL DATA:  Pain and swelling.  Wound. EXAM: LEFT TIBIA AND FIBULA - 2 VIEW COMPARISON:  None Available. FINDINGS: Severe osteopenia. Soft tissue swelling diffusely greatest towards the ankle. Diffuse vascular calcifications. No definite erosive changes. If there is further concern of bone infection, additional workup as clinically directed for further sensitivity such as bone scan or MRI IMPRESSION: Soft tissue swelling.  Severe osteopenia. Electronically Signed   By: Karen Kays M.D.   On: 10/01/2022 16:23   DG Foot Complete Left  Result Date: 10/01/2022 CLINICAL DATA:  Wound to left foot.  Swelling and pain EXAM: LEFT FOOT - COMPLETE 3 VIEW COMPARISON:  None Available. FINDINGS: Severe osteopenia. No acute fracture or dislocation. Mild degenerative changes of the dorsal aspect of the midfoot. Calcaneal spurs are well corticated. Scattered vascular calcifications. No definite erosive changes. Diffuse soft tissue swelling. However if there is further concern of bone infection, MRI or bone scan may be useful for much higher sensitivity. IMPRESSION:  Severe osteopenia with soft tissue swelling and degenerative changes. Electronically Signed   By: Karen Kays M.D.   On: 10/01/2022 16:22   DG Chest Port 1 View  Result Date: 10/01/2022 CLINICAL DATA:  Hypoxia. EXAM: PORTABLE CHEST 1 VIEW COMPARISON:  August 11, 2022. FINDINGS: Mild cardiomegaly with central pulmonary vascular congestion. Stable interstitial densities are noted throughout both lungs which may represent scarring. Right basilar atelectasis or infiltrate is noted with small right pleural effusion. Bony thorax is unremarkable. IMPRESSION: Mild right basilar atelectasis or infiltrate is noted with small right pleural effusion. Mild cardiomegaly with central pulmonary vascular congestion. Stable chronic interstitial densities are noted bilaterally which may represent scarring. Electronically Signed   By: Lupita Raider M.D.   On: 10/01/2022 15:52        Scheduled Meds:  (feeding supplement) PROSource Plus  30 mL Oral BID BM   feeding supplement (GLUCERNA SHAKE)  237 mL Oral TID BM   heparin  5,000 Units Subcutaneous Q8H   insulin aspart  0-5 Units Subcutaneous QHS   insulin aspart  0-9 Units Subcutaneous TID WC   levothyroxine  37.5 mcg Oral Q0600   linezolid  600 mg Oral Q12H   methylPREDNISolone (SOLU-MEDROL) injection  20 mg Intravenous Daily   multivitamin with minerals  1 tablet Oral Q lunch   pantoprazole (PROTONIX) IV  40 mg Intravenous QAC breakfast   Continuous Infusions:  pencillin G potassium IV       LOS: 2 days    Time spent: 35 minutes     Hoover Brunette, DO [facility]  If 7PM-7AM, please contact night-coverage  [email or web address] 10/03/2022, 12:02 PM

## 2022-10-03 NOTE — Progress Notes (Signed)
PHARMACY - PHYSICIAN COMMUNICATION CRITICAL VALUE ALERT - BLOOD CULTURE IDENTIFICATION (BCID)  Marie Hunt is an 89 y.o. female who presented to [facility] on 10/01/2022 with a chief complaint of left foot swelling.   Assessment:  89 year old female admitted with left leg cellulitis/some concern for pneumonia. Now with Group A Strep in 1/2 sets of blood cultures.   Name of physician (or Provider) Contacted: [facility] pharmacist informed Dr. Sherryll Burger  ID team informed via Connye Burkitt   Current antibiotics: unasyn  Changes to prescribed antibiotics recommended:  Change to pen G + linezolid per ID  Results for orders placed or performed during the hospital encounter of 10/01/22  Blood Culture ID Panel (Reflexed) (Collected: 10/01/2022  4:58 PM)  Result Value Ref Range   Enterococcus faecalis NOT DETECTED NOT DETECTED   Enterococcus Faecium NOT DETECTED NOT DETECTED   Listeria monocytogenes NOT DETECTED NOT DETECTED   Staphylococcus species NOT DETECTED NOT DETECTED   Staphylococcus aureus (BCID) NOT DETECTED NOT DETECTED   Staphylococcus epidermidis NOT DETECTED NOT DETECTED   Staphylococcus lugdunensis NOT DETECTED NOT DETECTED   Streptococcus species DETECTED (A) NOT DETECTED   Streptococcus agalactiae NOT DETECTED NOT DETECTED   Streptococcus pneumoniae NOT DETECTED NOT DETECTED   Streptococcus pyogenes DETECTED (A) NOT DETECTED   A.calcoaceticus-baumannii NOT DETECTED NOT DETECTED   Bacteroides fragilis NOT DETECTED NOT DETECTED   Enterobacterales NOT DETECTED NOT DETECTED   Enterobacter cloacae complex NOT DETECTED NOT DETECTED   Escherichia coli NOT DETECTED NOT DETECTED   Klebsiella aerogenes NOT DETECTED NOT DETECTED   Klebsiella oxytoca NOT DETECTED NOT DETECTED   Klebsiella pneumoniae NOT DETECTED NOT DETECTED   Proteus species NOT DETECTED NOT DETECTED   Salmonella species NOT DETECTED NOT DETECTED   Serratia marcescens NOT DETECTED NOT DETECTED   Haemophilus influenzae NOT  DETECTED NOT DETECTED   Neisseria meningitidis NOT DETECTED NOT DETECTED   Pseudomonas aeruginosa NOT DETECTED NOT DETECTED   Stenotrophomonas maltophilia NOT DETECTED NOT DETECTED   Candida albicans NOT DETECTED NOT DETECTED   Candida auris NOT DETECTED NOT DETECTED   Candida glabrata NOT DETECTED NOT DETECTED   Candida krusei NOT DETECTED NOT DETECTED   Candida parapsilosis NOT DETECTED NOT DETECTED   Candida tropicalis NOT DETECTED NOT DETECTED   Cryptococcus neoformans/gattii NOT DETECTED NOT DETECTED    Sharin Mons, PharmD, BCPS, BCIDP Infectious Diseases Clinical Pharmacist Phone: [phone] 10/03/2022  11:52 AM

## 2022-10-03 NOTE — Progress Notes (Signed)
Hgb is 6.4 this morning, down from 7.1 yesterday. No signs of bleeding noted by RN. Plan to transfuse 1 unit RBC.

## 2022-10-04 ENCOUNTER — Inpatient Hospital Stay (HOSPITAL_COMMUNITY): Payer: 59

## 2022-10-04 DIAGNOSIS — J189 Pneumonia, unspecified organism: Secondary | ICD-10-CM | POA: Diagnosis not present

## 2022-10-04 DIAGNOSIS — I38 Endocarditis, valve unspecified: Secondary | ICD-10-CM

## 2022-10-04 LAB — GLUCOSE, CAPILLARY
Glucose-Capillary: 191 mg/dL — ABNORMAL HIGH (ref 70–99)
Glucose-Capillary: 185 mg/dL — ABNORMAL HIGH (ref 70–99)
Glucose-Capillary: 241 mg/dL — ABNORMAL HIGH (ref 70–99)
Glucose-Capillary: 274 mg/dL — ABNORMAL HIGH (ref 70–99)

## 2022-10-04 MED ORDER — HYDROMORPHONE HCL 1 MG/ML IJ SOLN
0.5000 mg | INTRAMUSCULAR | Status: DC | PRN
  Administered 2022-10-04 – 2022-10-07 (×9): 0.5 mg via INTRAVENOUS
  Filled 2022-10-04 (×8): qty 0.5

## 2022-10-04 MED ORDER — LINEZOLID 600 MG/300ML IV SOLN
600.0000 mg | Freq: Two times a day (BID) | INTRAVENOUS | Status: AC
  Administered 2022-10-04 – 2022-10-05 (×4): 600 mg via INTRAVENOUS
  Filled 2022-10-04 (×4): qty 300

## 2022-10-04 MED ORDER — LACTATED RINGERS IV SOLN: INTRAVENOUS | Status: DC

## 2022-10-04 MED ORDER — LACTATED RINGERS IV SOLN: INTRAVENOUS | Status: AC

## 2022-10-04 MED ORDER — HYDROMORPHONE HCL 1 MG/ML IJ SOLN
INTRAMUSCULAR | Status: AC
  Filled 2022-10-04: qty 0.5

## 2022-10-04 NOTE — Progress Notes (Signed)
Unsuccessful attempt to insert [place] tube as ordered, physician contacted and is aware, will re-attempt tomorrow.

## 2022-10-04 NOTE — Progress Notes (Signed)
PROGRESS NOTE    Marie Hunt  WJX:[id number] DOB: 1932-06-29 DOA: 10/01/2022 PCP: Lindaann Pascal   Brief Narrative:    Marie Hunt is a 89 y.o. female with medical history significant for systolic and diastolic CHF, diabetes mellitus, dementia, hypertension. Patient was brought to the ED via EMS for reports of pain to the left foot, patient unable to ambulate.  She has also noticed increased work of breathing from her baseline as well as worsening chronic cough.  She was admitted for multifocal pneumonia with bilateral pleural effusions and started on IV antibiotics.  Patient is not eating and did not tolerate NG tube placement today.  Overall appears to be declining.  Assessment & Plan:   Principal Problem:   Multifocal pneumonia Active Problems:   Acute hypoxic respiratory failure (HCC)   Left leg cellulitis   Type 2 diabetes mellitus (HCC)   Chronic combined systolic and diastolic CHF (congestive heart failure) (HCC)   Secondary DM with CKD stage 4 and hypertension (HCC)   Vascular dementia without behavioral disturbance (HCC)   History of gout   Essential hypertension   Acquired hypothyroidism  Assessment and Plan:    Multifocal pneumonia with strep bacteremia Pneumonia with acute hypoxic respiratory failure.  CTA chest showing multifocal pneumonia, also with moderate right and small left pleural effusion.  Significant leukocytosis of 29.6.  Lactic acid 2.3 > 2. -IV penicillin G and linezolid per ID -Need to confirm indication for Eliquis, no documentation in chart- (Eliquis held for now pending Ct findings) called patient's granddaughter Marie Hunt back to ask, no response. -Thoracentesis performed 8/9 with no findings of infection noted -Change linezolid to IV since not tolerating oral for 4 doses -Transthoracic echocardiogram with no signs of vegetations and now reduced LVEF of 30-35% compared to 50-55% previously and small pericardial effusion   Left leg  cellulitis Increasing pain, tenderness on exam, with swelling and erythema, chronic nonhealing ulcer with minimal drainage. Significant leukocytosis of 29.6.  History of gout.  Rules out for sepsis. - Obtain CT of the left foot considering significant leukocytosis -Continue on penicillin G and linezolid per ID  Acute on chronic anemia No overt bleeding identified Plan to transfuse 1 unit PRBC 8/9  Acute hypoxic respiratory failure (HCC) O2 sats down to 85% on room air, likely due to multifocal pneumonia, and moderate right pleural effusion, small left pleural effusion.   Acute gout Left foot swelling, likely cellulitis, but also possibly gout.  Not on medication for gout. -No longer on chronic medications -Continue IV Solu-Medrol -Uric acid and CRP elevated     Vascular dementia without behavioral disturbance (HCC) Stable.  Nursing home resident.  Baseline most times able to recognize family, answer simple questions, significant short-term memory loss.   Secondary DM with CKD stage 4 and hypertension (HCC) Creatinine starting to elevate, but should improve with PRBC transfusion Monitor strict I's and O's Avoid nephrotoxic agents Started on gentle IV fluids since not tolerating p.o. intake   Chronic combined systolic and diastolic CHF (congestive heart failure) (HCC) No peripheral signs of volume overload.  CT showing pleural effusions.   Type 2 diabetes mellitus (HCC) A1c 7.7. - SSI- S -Hold linagliptin   Acquired hypothyroidism Resume Synthroid   Essential hypertension Blood pressure soft  -systolics 96-109. -Hold Lasix 40 mg, hydralazine 25 mg, metoprolol 12.5 mg, for now   DVT prophylaxis:Heparin Code Status: Full Family Communication: Granddaughter on phone 8/10 Disposition Plan: Continue treatment for pneumonia with IV antibiotics Status is: Inpatient  Remains inpatient appropriate because: Need for IV medications.   Consultants:  Palliative  care ID  Procedures:  None  Antimicrobials:  Anti-infectives (From admission, onward)    Start     Dose/Rate Route Frequency Ordered Stop   10/04/22 1345  linezolid (ZYVOX) IVPB 600 mg        600 mg 300 mL/hr over 60 Minutes Intravenous Every 12 hours 10/04/22 1254 10/06/22 0959   10/03/22 2200  vancomycin (VANCOREADY) IVPB 750 mg/150 mL  Status:  Discontinued        750 mg 150 mL/hr over 60 Minutes Intravenous Every 48 hours 10/02/22 1041 10/02/22 1507   10/03/22 1245  linezolid (ZYVOX) tablet 600 mg  Status:  Discontinued        600 mg Oral Every 12 hours 10/03/22 1151 10/04/22 1254   10/03/22 1200  Ampicillin-Sulbactam (UNASYN) 3 g in sodium chloride 0.9 % 100 mL IVPB  Status:  Discontinued        3 g 200 mL/hr over 30 Minutes Intravenous Every 12 hours 10/03/22 1058 10/03/22 1151   10/03/22 1200  penicillin G potassium 4 Million Units in dextrose 5 % 250 mL IVPB        4 Million Units 250 mL/hr over 60 Minutes Intravenous Every 8 hours 10/03/22 1151     10/02/22 1800  ceFEPIme (MAXIPIME) 2 g in sodium chloride 0.9 % 100 mL IVPB  Status:  Discontinued        2 g 200 mL/hr over 30 Minutes Intravenous Every 24 hours 10/01/22 1759 10/01/22 2140   10/02/22 1800  cefTRIAXone (ROCEPHIN) 2 g in sodium chloride 0.9 % 100 mL IVPB  Status:  Discontinued        2 g 200 mL/hr over 30 Minutes Intravenous Every 24 hours 10/01/22 2140 10/02/22 1507   10/02/22 1700  Ampicillin-Sulbactam (UNASYN) 3 g in sodium chloride 0.9 % 100 mL IVPB  Status:  Discontinued        3 g 200 mL/hr over 30 Minutes Intravenous Every 24 hours 10/02/22 1510 10/03/22 1058   10/01/22 2230  doxycycline (VIBRAMYCIN) 100 mg in sodium chloride 0.9 % 250 mL IVPB  Status:  Discontinued        100 mg 125 mL/hr over 120 Minutes Intravenous Every 12 hours 10/01/22 2140 10/02/22 1507   10/01/22 1801  vancomycin variable dose per unstable renal function (pharmacist dosing)  Status:  Discontinued         Does not apply See admin  instructions 10/01/22 1802 10/02/22 1507   10/01/22 1800  ceFEPIme (MAXIPIME) 2 g in sodium chloride 0.9 % 100 mL IVPB        2 g 200 mL/hr over 30 Minutes Intravenous  Once 10/01/22 1746 10/01/22 2036   10/01/22 1800  metroNIDAZOLE (FLAGYL) IVPB 500 mg        500 mg 100 mL/hr over 60 Minutes Intravenous  Once 10/01/22 1746 10/01/22 2139   10/01/22 1800  vancomycin (VANCOCIN) IVPB 1000 mg/200 mL premix        1,000 mg 200 mL/hr over 60 Minutes Intravenous  Once 10/01/22 1746 10/01/22 2323   10/01/22 1745  vancomycin (VANCOCIN) IVPB 1000 mg/200 mL premix  Status:  Discontinued        1,000 mg 200 mL/hr over 60 Minutes Intravenous  Once 10/01/22 1743 10/01/22 1744   10/01/22 1745  ceFEPIme (MAXIPIME) 2 g in sodium chloride 0.9 % 100 mL IVPB  Status:  Discontinued  2 g 200 mL/hr over 30 Minutes Intravenous  Once 10/01/22 1743 10/01/22 1744         Subjective: Patient seen and evaluated today with ongoing discomfort and pain.  She is not taking oral medications and not eating.  NG tube attempted which was unsuccessful.  Objective: Vitals:   10/03/22 1400 10/03/22 1456 10/03/22 2105 10/04/22 0400  BP: (!) 105/47 (!) 105/49 (!) 104/50 (!) 105/50  Pulse:   72 68  Resp:   18   Temp:   98.7 F (37.1 C) 98.5 F (36.9 C)  TempSrc:   Axillary Oral  SpO2: 99% 98% 100% 100%  Weight:        Intake/Output Summary (Last 24 hours) at 10/04/2022 1424 Last data filed at 10/04/2022 1040 Gross per 24 hour  Intake 0 ml  Output 120 ml  Net -120 ml   Filed Weights   10/01/22 1745 10/01/22 2121  Weight: 55 kg 61.4 kg    Examination:  General exam: Mostly somnolent and intermittently agitated Respiratory system: Clear to auscultation. Respiratory effort normal. Cardiovascular system: S1 & S2 heard, RRR.  Gastrointestinal system: Abdomen is soft Central nervous system: Mostly somnolent Extremities: No edema Skin: No significant lesions noted    Data Reviewed: I have personally  reviewed following labs and imaging studies  CBC: Recent Labs  Lab 10/01/22 1642 10/02/22 0405 10/03/22 0413 10/03/22 1236 10/04/22 0504  WBC 29.6* 29.1* 27.1*  --  26.8*  NEUTROABS 27.8*  --   --   --   --   HGB 7.5* 7.1* 6.4* 8.1* 8.3*  HCT 24.3* 23.2* 20.9* 25.4* 26.2*  MCV 83.5 82.6 81.6  --  84.2  PLT 170 165 156  --  153   Basic Metabolic Panel: Recent Labs  Lab 10/01/22 1642 10/02/22 0405 10/03/22 0413 10/04/22 0504  NA 135 136 137 140  K 4.8 4.6 4.5 4.7  CL 103 106 109 111  CO2 20* 19* 18* 19*  GLUCOSE 275* 146* 138* 180*  BUN 44* 45* 49* 54*  CREATININE 2.82* 2.75* 2.99* 2.96*  CALCIUM 8.8* 8.6* 8.4* 8.7*  MG  --   --  2.0 2.1   GFR: Estimated Creatinine Clearance: 11.6 mL/min (A) (by C-G formula based on SCr of 2.96 mg/dL (H)). Liver Function Tests: Recent Labs  Lab 10/01/22 1642  AST 18  ALT 13  ALKPHOS 132*  BILITOT 0.5  PROT 6.4*  ALBUMIN 2.4*   No results for input(s): "LIPASE", "AMYLASE" in the last 168 hours. No results for input(s): "AMMONIA" in the last 168 hours. Coagulation Profile: Recent Labs  Lab 10/01/22 1642  INR 1.7*   Cardiac Enzymes: No results for input(s): "CKTOTAL", "CKMB", "CKMBINDEX", "TROPONINI" in the last 168 hours. BNP (last 3 results) No results for input(s): "PROBNP" in the last 8760 hours. HbA1C: No results for input(s): "HGBA1C" in the last 72 hours. CBG: Recent Labs  Lab 10/03/22 1101 10/03/22 1617 10/03/22 2047 10/04/22 0749 10/04/22 1157  GLUCAP 124* 161* 152* 191* 185*   Lipid Profile: No results for input(s): "CHOL", "HDL", "LDLCALC", "TRIG", "CHOLHDL", "LDLDIRECT" in the last 72 hours. Thyroid Function Tests: No results for input(s): "TSH", "T4TOTAL", "FREET4", "T3FREE", "THYROIDAB" in the last 72 hours. Anemia Panel: No results for input(s): "VITAMINB12", "FOLATE", "FERRITIN", "TIBC", "IRON", "RETICCTPCT" in the last 72 hours. Sepsis Labs: Recent Labs  Lab 10/01/22 1642 10/01/22 1837   LATICACIDVEN 2.3* 2.0*    Recent Results (from the past 240 hour(s))  Culture, blood (Routine X 2)  w Reflex to ID Panel     Status: None (Preliminary result)   Collection Time: 10/01/22  4:42 PM   Specimen: Right Antecubital; Blood  Result Value Ref Range Status   Specimen Description RIGHT ANTECUBITAL  Final   Special Requests   Final    BOTTLES DRAWN AEROBIC AND ANAEROBIC Blood Culture adequate volume   Culture   Final    NO GROWTH 3 DAYS Performed at [facility], [address], [place], [place] [address]    Report Status PENDING  Incomplete  Culture, blood (Routine X 2) w Reflex to ID Panel     Status: Abnormal (Preliminary result)   Collection Time: 10/01/22  4:58 PM   Specimen: Left Antecubital; Blood  Result Value Ref Range Status   Specimen Description   Final    LEFT ANTECUBITAL Performed at [facility], [address] [place], [place] [address]    Special Requests   Final    BOTTLES DRAWN AEROBIC AND ANAEROBIC Blood Culture adequate volume Performed at [facility], [address] [place], [place] [address]    Culture  Setup Time   Final    GRAM POSITIVE COCCI IN CHAINS IN BOTH AEROBIC AND ANAEROBIC BOTTLES Gram Stain Report Called to,Read Back By and Verified With: CATES LAND, MICHELLE @ 0900 ON 10/02/2022 BY FRATTO,ASHLEY CRITICAL RESULT CALLED TO, READ BACK BY AND VERIFIED WITH: PHARMD WILL Dareen Piano [id number] AT 1401 BY CM    Culture (A)  Final    STREPTOCOCCUS PYOGENES HEALTH DEPARTMENT NOTIFIED Performed at [facility], [address], [place], [place] [address]    Report Status PENDING  Incomplete   Organism ID, Bacteria STREPTOCOCCUS PYOGENES  Final      Susceptibility   Streptococcus pyogenes - MIC*    PENICILLIN <=0.06 SENSITIVE Sensitive     CEFTRIAXONE <=0.12 SENSITIVE Sensitive     ERYTHROMYCIN 4 RESISTANT Resistant     LEVOFLOXACIN <=0.25 SENSITIVE Sensitive     VANCOMYCIN 0.5 SENSITIVE Sensitive     * STREPTOCOCCUS PYOGENES  Blood  Culture ID Panel (Reflexed)     Status: Abnormal   Collection Time: 10/01/22  4:58 PM  Result Value Ref Range Status   Enterococcus faecalis NOT DETECTED NOT DETECTED Final   Enterococcus Faecium NOT DETECTED NOT DETECTED Final   Listeria monocytogenes NOT DETECTED NOT DETECTED Final   Staphylococcus species NOT DETECTED NOT DETECTED Final   Staphylococcus aureus (BCID) NOT DETECTED NOT DETECTED Final   Staphylococcus epidermidis NOT DETECTED NOT DETECTED Final   Staphylococcus lugdunensis NOT DETECTED NOT DETECTED Final   Streptococcus species DETECTED (A) NOT DETECTED Final    Comment: CRITICAL RESULT CALLED TO, READ BACK BY AND VERIFIED WITH: PHARMD WILL Dareen Piano [id number] AT 1401 BY CM    Streptococcus agalactiae NOT DETECTED NOT DETECTED Final   Streptococcus pneumoniae NOT DETECTED NOT DETECTED Final   Streptococcus pyogenes DETECTED (A) NOT DETECTED Final    Comment: CRITICAL RESULT CALLED TO, READ BACK BY AND VERIFIED WITH: PHARMD WILL Dareen Piano [id number] AT 1401 BY CM    A.calcoaceticus-baumannii NOT DETECTED NOT DETECTED Final   Bacteroides fragilis NOT DETECTED NOT DETECTED Final   Enterobacterales NOT DETECTED NOT DETECTED Final   Enterobacter cloacae complex NOT DETECTED NOT DETECTED Final   Escherichia coli NOT DETECTED NOT DETECTED Final   Klebsiella aerogenes NOT DETECTED NOT DETECTED Final   Klebsiella oxytoca NOT DETECTED NOT DETECTED Final   Klebsiella pneumoniae NOT DETECTED NOT DETECTED Final   Proteus species NOT DETECTED NOT  DETECTED Final   Salmonella species NOT DETECTED NOT DETECTED Final   Serratia marcescens NOT DETECTED NOT DETECTED Final   Haemophilus influenzae NOT DETECTED NOT DETECTED Final   Neisseria meningitidis NOT DETECTED NOT DETECTED Final   Pseudomonas aeruginosa NOT DETECTED NOT DETECTED Final   Stenotrophomonas maltophilia NOT DETECTED NOT DETECTED Final   Candida albicans NOT DETECTED NOT DETECTED Final   Candida auris NOT DETECTED NOT DETECTED  Final   Candida glabrata NOT DETECTED NOT DETECTED Final   Candida krusei NOT DETECTED NOT DETECTED Final   Candida parapsilosis NOT DETECTED NOT DETECTED Final   Candida tropicalis NOT DETECTED NOT DETECTED Final   Cryptococcus neoformans/gattii NOT DETECTED NOT DETECTED Final    Comment: Performed at [facility], [address], [place], [place] [address]  Resp panel by RT-PCR (RSV, Flu A&B, Covid) Anterior Nasal Swab     Status: None   Collection Time: 10/01/22  7:40 PM   Specimen: Anterior Nasal Swab  Result Value Ref Range Status   SARS Coronavirus 2 by RT PCR NEGATIVE NEGATIVE Final    Comment: (NOTE) SARS-CoV-2 target nucleic acids are NOT DETECTED.  The SARS-CoV-2 RNA is generally detectable in upper respiratory specimens during the acute phase of infection. The lowest concentration of SARS-CoV-2 viral copies this assay can detect is 138 copies/mL. A negative result does not preclude SARS-Cov-2 infection and should not be used as the sole basis for treatment or other patient management decisions. A negative result may occur with  improper specimen collection/handling, submission of specimen other than nasopharyngeal swab, presence of viral mutation(s) within the areas targeted by this assay, and inadequate number of viral copies(<138 copies/mL). A negative result must be combined with clinical observations, patient history, and epidemiological information. The expected result is Negative.  Fact Sheet for Patients:  BloggerCourse.com  Fact Sheet for Healthcare Providers:  SeriousBroker.it  This test is no t yet approved or cleared by the [place] FDA and  has been authorized for detection and/or diagnosis of SARS-CoV-2 by FDA under an Emergency Use Authorization (EUA). This EUA will remain  in effect (meaning this test can be used) for the duration of the COVID-19 declaration under Section 564(b)(1) of the Act,  21 U.S.C.section 360bbb-3(b)(1), unless the authorization is terminated  or revoked sooner.       Influenza A by PCR NEGATIVE NEGATIVE Final   Influenza B by PCR NEGATIVE NEGATIVE Final    Comment: (NOTE) The Xpert Xpress SARS-CoV-2/FLU/RSV plus assay is intended as an aid in the diagnosis of influenza from Nasopharyngeal swab specimens and should not be used as a sole basis for treatment. Nasal washings and aspirates are unacceptable for Xpert Xpress SARS-CoV-2/FLU/RSV testing.  Fact Sheet for Patients: BloggerCourse.com  Fact Sheet for Healthcare Providers: SeriousBroker.it  This test is not yet approved or cleared by the [place] FDA and has been authorized for detection and/or diagnosis of SARS-CoV-2 by FDA under an Emergency Use Authorization (EUA). This EUA will remain in effect (meaning this test can be used) for the duration of the COVID-19 declaration under Section 564(b)(1) of the Act, 21 U.S.C. section 360bbb-3(b)(1), unless the authorization is terminated or revoked.     Resp Syncytial Virus by PCR NEGATIVE NEGATIVE Final    Comment: (NOTE) Fact Sheet for Patients: BloggerCourse.com  Fact Sheet for Healthcare Providers: SeriousBroker.it  This test is not yet approved or cleared by the [place] FDA and has been authorized for detection and/or diagnosis of SARS-CoV-2 by FDA  under an Emergency Use Authorization (EUA). This EUA will remain in effect (meaning this test can be used) for the duration of the COVID-19 declaration under Section 564(b)(1) of the Act, 21 U.S.C. section 360bbb-3(b)(1), unless the authorization is terminated or revoked.  Performed at [facility], [address], [place], [place] [address]   Culture, blood (Routine X 2) w Reflex to ID Panel     Status: None (Preliminary result)   Collection Time: 10/02/22 10:01 AM   Specimen:  BLOOD  Result Value Ref Range Status   Specimen Description BLOOD BLOOD RIGHT WRIST  Final   Special Requests   Final    BOTTLES DRAWN AEROBIC AND ANAEROBIC Blood Culture adequate volume   Culture   Final    NO GROWTH 2 DAYS Performed at [facility], [address] [place], [place] [address]    Report Status PENDING  Incomplete  Culture, blood (Routine X 2) w Reflex to ID Panel     Status: None (Preliminary result)   Collection Time: 10/02/22 10:03 AM   Specimen: BLOOD  Result Value Ref Range Status   Specimen Description BLOOD BLOOD LEFT HAND  Final   Special Requests   Final    BOTTLES DRAWN AEROBIC AND ANAEROBIC Blood Culture adequate volume   Culture   Final    NO GROWTH 2 DAYS Performed at [facility], [address], [place], [place] [address]    Report Status PENDING  Incomplete  Gram stain     Status: None   Collection Time: 10/03/22  2:15 PM   Specimen: Pleura  Result Value Ref Range Status   Specimen Description PLEURAL  Final   Special Requests NONE  Final   Gram Stain   Final    WBC PRESENT, PREDOMINANTLY MONONUCLEAR PLEURAL NO ORGANISMS SEEN CYTOSPIN SMEAR Performed at [facility], [address] [place], [place] [address]    Report Status 10/03/2022 FINAL  Final  Culture, body fluid w Gram Stain-bottle     Status: None (Preliminary result)   Collection Time: 10/03/22  2:15 PM   Specimen: Pleura  Result Value Ref Range Status   Specimen Description PLEURAL  Final   Special Requests 10CC BOTTLES DRAWN AEROBIC AND ANAEROBIC  Final   Culture   Final    NO GROWTH < 24 HOURS Performed at [facility], [address], [place], [place] [address]    Report Status PENDING  Incomplete         Radiology Studies: ECHOCARDIOGRAM COMPLETE  Result Date: 10/04/2022    ECHOCARDIOGRAM REPORT   Patient Name:   ANAMARIA STATZ Date of Exam: 10/04/2022 Medical Rec #:  [id number]          Height:       64.0 in Accession #:    [id number]         Weight:       135.4 lb  Date of Birth:  May 10, 1932          BSA:          1.657 m Patient Age:    89 years           BP:           105/50 mmHg Patient Gender: F                  HR:           87 bpm. [facility] Procedure: 2D Echo, Cardiac Doppler and Color Doppler Indications:  Endocarditis.  History:        Patient has prior history of Echocardiogram examinations, most                 recent 08/12/2022. CHF, Aortic Valve Disease; Risk                 Factors:Hypertension, Diabetes and Dyslipidemia. Aortic                 stenosis.  Sonographer:    Sheralyn Boatman RDCS Referring Phys: [id number] [facility]  Sonographer Comments: Image acquisition challenging due to uncooperative patient. Patient would not let me continue exam. Patient yelled out to leave her alone. Patient clinched left arm to side and yelled so I could not get to apical region. RN notified. IMPRESSIONS  1. Left ventricular ejection fraction, by estimation, is 30 to 35%. The left ventricle has moderately decreased function. The left ventricle has no regional wall motion abnormalities. There is moderate concentric left ventricular hypertrophy. Left ventricular diastolic function could not be evaluated.  2. Right ventricular systolic function was not well visualized. The right ventricular size is not well visualized.  3. A small pericardial effusion is present. The pericardial effusion is posterior to the left ventricle. Large pleural effusion in the left lateral region.  4. The mitral valve is degenerative. Mild mitral valve regurgitation. Mild mitral stenosis.  5. Tricuspid valve regurgitation is mild to moderate.  6. The aortic valve is calcified. Aortic valve regurgitation is mild.  7. Aortic dilatation noted. There is mild dilatation of the ascending aorta, measuring 37 mm. Comparison(s): Prior images reviewed side by side. LVEF appears worse than recent study. Incomplete study due to patient's inability complete exam. Conclusion(s)/Recommendation(s): If  within goals of care, consider finishing study if patient is amenable. FINDINGS  Left Ventricle: Left ventricular ejection fraction, by estimation, is 30 to 35%. The left ventricle has moderately decreased function. The left ventricle has no regional wall motion abnormalities. The left ventricular internal cavity size was normal in size. There is moderate concentric left ventricular hypertrophy. Left ventricular diastolic function could not be evaluated. Right Ventricle: The right ventricular size is not well visualized. No increase in right ventricular wall thickness. Right ventricular systolic function was not well visualized. Left Atrium: Left atrial size was not well visualized. Right Atrium: Right atrial size was not well visualized. Pericardium: A small pericardial effusion is present. The pericardial effusion is posterior to the left ventricle. Mitral Valve: The mitral valve is degenerative in appearance. Mild mitral valve regurgitation. Mild mitral valve stenosis. Tricuspid Valve: The tricuspid valve is grossly normal. Tricuspid valve regurgitation is mild to moderate. No evidence of tricuspid stenosis. Aortic Valve: The aortic valve is calcified. Aortic valve regurgitation is mild. Pulmonic Valve: The pulmonic valve was normal in structure. Pulmonic valve regurgitation is not visualized. No evidence of pulmonic stenosis. Aorta: Aortic dilatation noted. There is mild dilatation of the ascending aorta, measuring 37 mm. IAS/Shunts: No atrial level shunt detected by color flow Doppler. Additional Comments: There is a large pleural effusion in the left lateral region.  LEFT VENTRICLE PLAX 2D LVIDd:         4.50 cm LVIDs:         3.90 cm LV PW:         1.40 cm LV IVS:        1.40 cm LVOT diam:     2.30 cm LVOT Area:     4.15 cm  LEFT ATRIUM  Index LA diam:    4.20 cm 2.53 cm/m   AORTA Ao Root diam: 3.30 cm Ao Asc diam:  3.70 cm TRICUSPID VALVE TR Peak grad:   36.0 mmHg TR Vmax:        300.00 cm/s  SHUNTS  Systemic Diam: 2.30 cm Riley Lam MD Electronically signed by Riley Lam MD Signature Date/Time: 10/04/2022/1:41:53 PM    Final    US THORACENTESIS ASP PLEURAL SPACE W/IMG GUIDE  Result Date: 10/03/2022 INDICATION: Right pleural effusion.  Hypoxia. EXAM: ULTRASOUND GUIDED right THORACENTESIS MEDICATIONS: None. COMPLICATIONS: None immediate. PROCEDURE: An ultrasound guided thoracentesis was thoroughly discussed with the patient and questions answered. The benefits, risks, alternatives and complications were also discussed. The patient understands and wishes to proceed with the procedure. Written consent was obtained. Ultrasound was performed to localize and mark an adequate pocket of fluid in the right chest. The area was then prepped and draped in the normal sterile fashion. 1% Lidocaine was used for local anesthesia. Under ultrasound guidance a 6 Fr Safe-T-Centesis catheter was introduced. Thoracentesis was performed. The catheter was removed and a dressing applied. FINDINGS: A total of approximately 350 mL of clear yellow fluid was removed. Samples were sent to the laboratory as requested by the clinical team. IMPRESSION: Successful ultrasound guided right thoracentesis yielding 350 mL of pleural fluid. Electronically Signed   By: Marin Roberts M.D.   On: 10/03/2022 15:19   DG Chest 1 View  Result Date: 10/03/2022 CLINICAL DATA:  Status post thoracentesis. EXAM: CHEST  1 VIEW COMPARISON:  None Available. FINDINGS: The heart is enlarged. Atherosclerotic changes are present at the aortic arch. Mild pulmonary vascular congestion is present. The right pleural effusion is decreased. No pneumothorax is present. IMPRESSION: 1. Decreased right pleural effusion without pneumothorax. 2. Cardiomegaly and mild pulmonary vascular congestion. Electronically Signed   By: Marin Roberts M.D.   On: 10/03/2022 15:03        Scheduled Meds:  (feeding supplement) PROSource Plus  30 mL Oral  BID BM   feeding supplement (GLUCERNA SHAKE)  237 mL Oral TID BM   heparin  5,000 Units Subcutaneous Q8H   HYDROmorphone       insulin aspart  0-5 Units Subcutaneous QHS   insulin aspart  0-9 Units Subcutaneous TID WC   methylPREDNISolone (SOLU-MEDROL) injection  20 mg Intravenous Daily   multivitamin with minerals  1 tablet Oral Q lunch   pantoprazole (PROTONIX) IV  40 mg Intravenous QAC breakfast   Continuous Infusions:  lactated ringers     linezolid (ZYVOX) IV     pencillin G potassium IV 4 Million Units (10/04/22 0528)     LOS: 3 days    Time spent: 35 minutes     Hoover Brunette, DO [facility]  If 7PM-7AM, please contact night-coverage [email or web address] 10/04/2022, 2:24 PM

## 2022-10-04 NOTE — Progress Notes (Signed)
  Echocardiogram 2D Echocardiogram has been performed.  Marie Hunt 10/04/2022, 1:06 PM

## 2022-10-05 ENCOUNTER — Inpatient Hospital Stay: Payer: Self-pay

## 2022-10-05 ENCOUNTER — Inpatient Hospital Stay (HOSPITAL_COMMUNITY): Payer: 59

## 2022-10-05 DIAGNOSIS — R7881 Bacteremia: Secondary | ICD-10-CM

## 2022-10-05 DIAGNOSIS — J189 Pneumonia, unspecified organism: Secondary | ICD-10-CM | POA: Diagnosis not present

## 2022-10-05 LAB — CBC
HCT: 29.3 % — ABNORMAL LOW (ref 36.0–46.0)
Hemoglobin: 9.3 g/dL — ABNORMAL LOW (ref 12.0–15.0)
MCH: 26.6 pg (ref 26.0–34.0)
MCHC: 31.7 g/dL (ref 30.0–36.0)
MCV: 84 fL (ref 80.0–100.0)
Platelets: 186 10*3/uL (ref 150–400)
RBC: 3.49 MIL/uL — ABNORMAL LOW (ref 3.87–5.11)
RDW: 18.2 % — ABNORMAL HIGH (ref 11.5–15.5)
WBC: 27.8 10*3/uL — ABNORMAL HIGH (ref 4.0–10.5)
nRBC: 0 % (ref 0.0–0.2)

## 2022-10-05 LAB — BASIC METABOLIC PANEL WITH GFR
Anion gap: 10 (ref 5–15)
BUN: 59 mg/dL — ABNORMAL HIGH (ref 8–23)
CO2: 18 mmol/L — ABNORMAL LOW (ref 22–32)
Calcium: 8.4 mg/dL — ABNORMAL LOW (ref 8.9–10.3)
Chloride: 107 mmol/L (ref 98–111)
Creatinine, Ser: 2.92 mg/dL — ABNORMAL HIGH (ref 0.44–1.00)
GFR, Estimated: 15 mL/min — ABNORMAL LOW (ref >60)
Glucose, Bld: 303 mg/dL — ABNORMAL HIGH (ref 70–99)
Potassium: 4.8 mmol/L (ref 3.5–5.1)
Sodium: 135 mmol/L (ref 135–145)

## 2022-10-05 LAB — GLUCOSE, CAPILLARY
Glucose-Capillary: 293 mg/dL — ABNORMAL HIGH (ref 70–99)
Glucose-Capillary: 250 mg/dL — ABNORMAL HIGH (ref 70–99)
Glucose-Capillary: 263 mg/dL — ABNORMAL HIGH (ref 70–99)
Glucose-Capillary: 209 mg/dL — ABNORMAL HIGH (ref 70–99)

## 2022-10-05 LAB — MAGNESIUM: Magnesium: 2.3 mg/dL (ref 1.7–2.4)

## 2022-10-05 MED ORDER — PROSOURCE PLUS PO LIQD
30.0000 mL | Freq: Three times a day (TID) | ORAL | Status: DC
  Administered 2022-10-05 – 2022-10-09 (×10): 30 mL via ORAL
  Filled 2022-10-05 (×10): qty 30

## 2022-10-05 MED ORDER — INSULIN ASPART 100 UNIT/ML IJ SOLN
0.0000 [IU] | INTRAMUSCULAR | Status: DC
  Administered 2022-10-05: 3 [IU] via SUBCUTANEOUS
  Administered 2022-10-05: 5 [IU] via SUBCUTANEOUS
  Administered 2022-10-05: 3 [IU] via SUBCUTANEOUS
  Administered 2022-10-05: 5 [IU] via SUBCUTANEOUS
  Administered 2022-10-06: 2 [IU] via SUBCUTANEOUS
  Administered 2022-10-06 (×2): 3 [IU] via SUBCUTANEOUS
  Administered 2022-10-06: 2 [IU] via SUBCUTANEOUS
  Administered 2022-10-07: 9 [IU] via SUBCUTANEOUS
  Administered 2022-10-07 (×3): 2 [IU] via SUBCUTANEOUS
  Administered 2022-10-07 (×2): 9 [IU] via SUBCUTANEOUS
  Administered 2022-10-08: 1 [IU] via SUBCUTANEOUS
  Administered 2022-10-08: 5 [IU] via SUBCUTANEOUS
  Administered 2022-10-08: 3 [IU] via SUBCUTANEOUS
  Administered 2022-10-08: 5 [IU] via SUBCUTANEOUS
  Administered 2022-10-08: 1 [IU] via SUBCUTANEOUS
  Administered 2022-10-08: 3 [IU] via SUBCUTANEOUS
  Administered 2022-10-09: 2 [IU] via SUBCUTANEOUS
  Administered 2022-10-09: 3 [IU] via SUBCUTANEOUS
  Administered 2022-10-09: 2 [IU] via SUBCUTANEOUS

## 2022-10-05 NOTE — Progress Notes (Signed)
Patient slept most of the night during this shift. Dressing changed to left foot, it continued ot be red and has 2+ edema. Attempted to place prevlon boot on left foot and patient yelled out in pain. Place prevlon boot on right foot and place left foot on a pillow to elevate it. Prn medication given x2. Mouth care performed, tolerated fair.  Plan of care ongoing.

## 2022-10-05 NOTE — Progress Notes (Signed)
  Echocardiogram 2D Echocardiogram has been performed.  Marie Hunt 10/05/2022, 1:22 PM

## 2022-10-05 NOTE — Progress Notes (Signed)
ID Brief note  89 year old female admitted with: #Group A strep bacteremia secondary to left lower extremity wound #Concern for pneumonia versus atelectasis vs fluid overload #Sepsis secondary to #1 -TTE was incomplete, repeat TTE -8/7 blood Cx 1/2 GAS, 8/2 NG -SP thora with CX NG x2d Plan: Place PICC If TTE negative for veg then do 2 weeks of PEN from negative Cx, competed linezolid x 4 doses  #Leukocytosis in the setting of steroids  Dr. Renold Don is on service starting Monday

## 2022-10-05 NOTE — Progress Notes (Signed)
Dr.Shaw notified earlier in shift that pt's grandaughter is here. Wy[place]ha (grandaughter) informs RN that she is waiting for family before proceeding with NG tube placement  today.   Multiple family members here visiting with pt. RN spoke to [facility] to inquire if they had made decision, need to speak to MD, if there is anything they need or would like [place] to implement at this time. Nothing at this time per [facility] needing to be done, no questions or concerns.   No decision on placing NG tube. Family and RN continue to offer pt.sips of supplements.

## 2022-10-05 NOTE — Progress Notes (Signed)
PROGRESS NOTE    Marie Hunt  GEX:[id number] DOB: Jul 13, 1932 DOA: 10/01/2022 PCP: Lindaann Pascal   Brief Narrative:    Marie Hunt is a 89 y.o. female with medical history significant for systolic and diastolic CHF, diabetes mellitus, dementia, hypertension. Patient was brought to the ED via EMS for reports of pain to the left foot, patient unable to ambulate.  She has also noticed increased work of breathing from her baseline as well as worsening chronic cough.  She was admitted for multifocal pneumonia with bilateral pleural effusions and started on IV antibiotics.  Patient is not eating and did not tolerate NG tube placement today.  Overall appears to be declining.  Assessment & Plan:   Principal Problem:   Multifocal pneumonia Active Problems:   Acute hypoxic respiratory failure (HCC)   Left leg cellulitis   Type 2 diabetes mellitus (HCC)   Chronic combined systolic and diastolic CHF (congestive heart failure) (HCC)   Secondary DM with CKD stage 4 and hypertension (HCC)   Vascular dementia without behavioral disturbance (HCC)   History of gout   Essential hypertension   Acquired hypothyroidism  Assessment and Plan:    Multifocal pneumonia with strep bacteremia Pneumonia with acute hypoxic respiratory failure.  CTA chest showing multifocal pneumonia, also with moderate right and small left pleural effusion.  Significant leukocytosis of 29.6.  Lactic acid 2.3 > 2. -IV penicillin G and linezolid per ID -Need to confirm indication for Eliquis, no documentation in chart- (Eliquis held for now pending Ct findings) called patient's granddaughter Shirlean Mylar back to ask, no response. -Thoracentesis performed 8/9 with no findings of infection noted -Change linezolid to IV since not tolerating oral for 4 doses -Transthoracic echocardiogram with no signs of vegetations and now reduced LVEF of 30-35% compared to 50-55% previously and small pericardial effusion   Left leg  cellulitis Increasing pain, tenderness on exam, with swelling and erythema, chronic nonhealing ulcer with minimal drainage. Significant leukocytosis of 29.6.  History of gout.  Rules out for sepsis. - Obtain CT of the left foot considering significant leukocytosis -Continue on penicillin G and linezolid per ID  Acute on chronic anemia No overt bleeding identified Plan to transfuse 1 unit PRBC 8/9  Acute hypoxic respiratory failure (HCC) O2 sats down to 85% on room air, likely due to multifocal pneumonia, and moderate right pleural effusion, small left pleural effusion.   Acute gout Left foot swelling, likely cellulitis, but also possibly gout.  Not on medication for gout. -No longer on chronic medications -Continue IV Solu-Medrol -Uric acid and CRP elevated     Vascular dementia without behavioral disturbance (HCC) Stable.  Nursing home resident.  Baseline most times able to recognize family, answer simple questions, significant short-term memory loss.   Secondary DM with CKD stage 4 and hypertension (HCC) Creatinine starting to elevate, but should improve with PRBC transfusion Monitor strict I's and O's Avoid nephrotoxic agents Started on gentle IV fluids since not tolerating p.o. intake   Chronic combined systolic and diastolic CHF (congestive heart failure) (HCC) No peripheral signs of volume overload.  CT showing pleural effusions.   Type 2 diabetes mellitus (HCC) with steroid-induced hyperglycemia A1c 7.7. - SSI- S every 4 hours with noted hyperglycemia in the setting of steroid use -Hold linagliptin   Acquired hypothyroidism Resume Synthroid   Essential hypertension Continue to hold home blood pressure agents for now and monitor   DVT prophylaxis:Heparin Code Status: DNR Family Communication: Granddaughter on phone 8/11 Disposition Plan: Continue treatment  for pneumonia with IV antibiotics Status is: Inpatient Remains inpatient appropriate because: Need for IV  medications.   Consultants:  Palliative care ID  Procedures:  NG tube placement attempted 8/10 and patient did not tolerate  Antimicrobials:  Anti-infectives (From admission, onward)    Start     Dose/Rate Route Frequency Ordered Stop   10/04/22 1345  linezolid (ZYVOX) IVPB 600 mg        600 mg 300 mL/hr over 60 Minutes Intravenous Every 12 hours 10/04/22 1254 10/06/22 0959   10/03/22 2200  vancomycin (VANCOREADY) IVPB 750 mg/150 mL  Status:  Discontinued        750 mg 150 mL/hr over 60 Minutes Intravenous Every 48 hours 10/02/22 1041 10/02/22 1507   10/03/22 1245  linezolid (ZYVOX) tablet 600 mg  Status:  Discontinued        600 mg Oral Every 12 hours 10/03/22 1151 10/04/22 1254   10/03/22 1200  Ampicillin-Sulbactam (UNASYN) 3 g in sodium chloride 0.9 % 100 mL IVPB  Status:  Discontinued        3 g 200 mL/hr over 30 Minutes Intravenous Every 12 hours 10/03/22 1058 10/03/22 1151   10/03/22 1200  penicillin G potassium 4 Million Units in dextrose 5 % 250 mL IVPB        4 Million Units 250 mL/hr over 60 Minutes Intravenous Every 8 hours 10/03/22 1151     10/02/22 1800  ceFEPIme (MAXIPIME) 2 g in sodium chloride 0.9 % 100 mL IVPB  Status:  Discontinued        2 g 200 mL/hr over 30 Minutes Intravenous Every 24 hours 10/01/22 1759 10/01/22 2140   10/02/22 1800  cefTRIAXone (ROCEPHIN) 2 g in sodium chloride 0.9 % 100 mL IVPB  Status:  Discontinued        2 g 200 mL/hr over 30 Minutes Intravenous Every 24 hours 10/01/22 2140 10/02/22 1507   10/02/22 1700  Ampicillin-Sulbactam (UNASYN) 3 g in sodium chloride 0.9 % 100 mL IVPB  Status:  Discontinued        3 g 200 mL/hr over 30 Minutes Intravenous Every 24 hours 10/02/22 1510 10/03/22 1058   10/01/22 2230  doxycycline (VIBRAMYCIN) 100 mg in sodium chloride 0.9 % 250 mL IVPB  Status:  Discontinued        100 mg 125 mL/hr over 120 Minutes Intravenous Every 12 hours 10/01/22 2140 10/02/22 1507   10/01/22 1801  vancomycin variable dose per  unstable renal function (pharmacist dosing)  Status:  Discontinued         Does not apply See admin instructions 10/01/22 1802 10/02/22 1507   10/01/22 1800  ceFEPIme (MAXIPIME) 2 g in sodium chloride 0.9 % 100 mL IVPB        2 g 200 mL/hr over 30 Minutes Intravenous  Once 10/01/22 1746 10/01/22 2036   10/01/22 1800  metroNIDAZOLE (FLAGYL) IVPB 500 mg        500 mg 100 mL/hr over 60 Minutes Intravenous  Once 10/01/22 1746 10/01/22 2139   10/01/22 1800  vancomycin (VANCOCIN) IVPB 1000 mg/200 mL premix        1,000 mg 200 mL/hr over 60 Minutes Intravenous  Once 10/01/22 1746 10/01/22 2323   10/01/22 1745  vancomycin (VANCOCIN) IVPB 1000 mg/200 mL premix  Status:  Discontinued        1,000 mg 200 mL/hr over 60 Minutes Intravenous  Once 10/01/22 1743 10/01/22 1744   10/01/22 1745  ceFEPIme (MAXIPIME) 2 g in sodium  chloride 0.9 % 100 mL IVPB  Status:  Discontinued        2 g 200 mL/hr over 30 Minutes Intravenous  Once 10/01/22 1743 10/01/22 1744       Subjective: Patient seen and evaluated today with ongoing discomfort and pain.  She is not taking oral medications and not eating.  NG tube attempted yesterday which was unsuccessful.  Plan to attempt again today.  Granddaughter okay with transition to DNR.  Objective: Vitals:   10/04/22 0400 10/04/22 1535 10/04/22 2047 10/05/22 0900  BP: (!) 105/50 (!) 132/58 127/67 (!) 156/61  Pulse: 68 65 81 75  Resp:  20 18 17   Temp: 98.5 F (36.9 C) 98.8 F (37.1 C) 97.6 F (36.4 C) 97.7 F (36.5 C)  TempSrc: Oral Axillary Oral Oral  SpO2: 100% 100% 99% 100%  Weight:        Intake/Output Summary (Last 24 hours) at 10/05/2022 1039 Last data filed at 10/05/2022 1610 Gross per 24 hour  Intake 825.31 ml  Output 650 ml  Net 175.31 ml   Filed Weights   10/01/22 1745 10/01/22 2121  Weight: 55 kg 61.4 kg    Examination:  General exam: Mostly somnolent and intermittently agitated Respiratory system: Clear to auscultation. Respiratory effort  normal. Cardiovascular system: S1 & S2 heard, RRR.  Gastrointestinal system: Abdomen is soft Central nervous system: Mostly somnolent Extremities: No edema Skin: No significant lesions noted    Data Reviewed: I have personally reviewed following labs and imaging studies  CBC: Recent Labs  Lab 10/01/22 1642 10/02/22 0405 10/03/22 0413 10/03/22 1236 10/04/22 0504 10/05/22 0517  WBC 29.6* 29.1* 27.1*  --  26.8* 27.8*  NEUTROABS 27.8*  --   --   --   --   --   HGB 7.5* 7.1* 6.4* 8.1* 8.3* 9.3*  HCT 24.3* 23.2* 20.9* 25.4* 26.2* 29.3*  MCV 83.5 82.6 81.6  --  84.2 84.0  PLT 170 165 156  --  153 186   Basic Metabolic Panel: Recent Labs  Lab 10/01/22 1642 10/02/22 0405 10/03/22 0413 10/04/22 0504 10/05/22 0517  NA 135 136 137 140 135  K 4.8 4.6 4.5 4.7 4.8  CL 103 106 109 111 107  CO2 20* 19* 18* 19* 18*  GLUCOSE 275* 146* 138* 180* 303*  BUN 44* 45* 49* 54* 59*  CREATININE 2.82* 2.75* 2.99* 2.96* 2.92*  CALCIUM 8.8* 8.6* 8.4* 8.7* 8.4*  MG  --   --  2.0 2.1 2.3   GFR: Estimated Creatinine Clearance: 11.7 mL/min (A) (by C-G formula based on SCr of 2.92 mg/dL (H)). Liver Function Tests: Recent Labs  Lab 10/01/22 1642  AST 18  ALT 13  ALKPHOS 132*  BILITOT 0.5  PROT 6.4*  ALBUMIN 2.4*   No results for input(s): "LIPASE", "AMYLASE" in the last 168 hours. No results for input(s): "AMMONIA" in the last 168 hours. Coagulation Profile: Recent Labs  Lab 10/01/22 1642  INR 1.7*   Cardiac Enzymes: No results for input(s): "CKTOTAL", "CKMB", "CKMBINDEX", "TROPONINI" in the last 168 hours. BNP (last 3 results) No results for input(s): "PROBNP" in the last 8760 hours. HbA1C: No results for input(s): "HGBA1C" in the last 72 hours. CBG: Recent Labs  Lab 10/04/22 0749 10/04/22 1157 10/04/22 1725 10/04/22 2142 10/05/22 0732  GLUCAP 191* 185* 241* 274* 293*   Lipid Profile: No results for input(s): "CHOL", "HDL", "LDLCALC", "TRIG", "CHOLHDL", "LDLDIRECT" in  the last 72 hours. Thyroid Function Tests: No results for input(s): "TSH", "T4TOTAL", "  FREET4", "T3FREE", "THYROIDAB" in the last 72 hours. Anemia Panel: No results for input(s): "VITAMINB12", "FOLATE", "FERRITIN", "TIBC", "IRON", "RETICCTPCT" in the last 72 hours. Sepsis Labs: Recent Labs  Lab 10/01/22 1642 10/01/22 1837  LATICACIDVEN 2.3* 2.0*    Recent Results (from the past 240 hour(s))  Culture, blood (Routine X 2) w Reflex to ID Panel     Status: None (Preliminary result)   Collection Time: 10/01/22  4:42 PM   Specimen: Right Antecubital; Blood  Result Value Ref Range Status   Specimen Description RIGHT ANTECUBITAL  Final   Special Requests   Final    BOTTLES DRAWN AEROBIC AND ANAEROBIC Blood Culture adequate volume   Culture   Final    NO GROWTH 4 DAYS Performed at [facility], [address], [place], [place] [address]    Report Status PENDING  Incomplete  Culture, blood (Routine X 2) w Reflex to ID Panel     Status: Abnormal (Preliminary result)   Collection Time: 10/01/22  4:58 PM   Specimen: Left Antecubital; Blood  Result Value Ref Range Status   Specimen Description   Final    LEFT ANTECUBITAL Performed at [facility], [address] [place], [place] [address]    Special Requests   Final    BOTTLES DRAWN AEROBIC AND ANAEROBIC Blood Culture adequate volume Performed at [facility], [address] [place], [place] [address]    Culture  Setup Time   Final    GRAM POSITIVE COCCI IN CHAINS IN BOTH AEROBIC AND ANAEROBIC BOTTLES Gram Stain Report Called to,Read Back By and Verified With: CATES LAND, MICHELLE @ 0900 ON 10/02/2022 BY FRATTO,ASHLEY CRITICAL RESULT CALLED TO, READ BACK BY AND VERIFIED WITH: PHARMD WILL Dareen Piano [id number] AT 1401 BY CM    Culture (A)  Final    STREPTOCOCCUS PYOGENES HEALTH DEPARTMENT NOTIFIED Performed at [facility], [address] [place], [place] [address]    Report Status PENDING  Incomplete   Organism ID, Bacteria  STREPTOCOCCUS PYOGENES  Final      Susceptibility   Streptococcus pyogenes - MIC*    PENICILLIN <=0.06 SENSITIVE Sensitive     CEFTRIAXONE <=0.12 SENSITIVE Sensitive     ERYTHROMYCIN 4 RESISTANT Resistant     LEVOFLOXACIN <=0.25 SENSITIVE Sensitive     VANCOMYCIN 0.5 SENSITIVE Sensitive     * STREPTOCOCCUS PYOGENES  Blood Culture ID Panel (Reflexed)     Status: Abnormal   Collection Time: 10/01/22  4:58 PM  Result Value Ref Range Status   Enterococcus faecalis NOT DETECTED NOT DETECTED Final   Enterococcus Faecium NOT DETECTED NOT DETECTED Final   Listeria monocytogenes NOT DETECTED NOT DETECTED Final   Staphylococcus species NOT DETECTED NOT DETECTED Final   Staphylococcus aureus (BCID) NOT DETECTED NOT DETECTED Final   Staphylococcus epidermidis NOT DETECTED NOT DETECTED Final   Staphylococcus lugdunensis NOT DETECTED NOT DETECTED Final   Streptococcus species DETECTED (A) NOT DETECTED Final    Comment: CRITICAL RESULT CALLED TO, READ BACK BY AND VERIFIED WITH: PHARMD WILL Dareen Piano [id number] AT 1401 BY CM    Streptococcus agalactiae NOT DETECTED NOT DETECTED Final   Streptococcus pneumoniae NOT DETECTED NOT DETECTED Final   Streptococcus pyogenes DETECTED (A) NOT DETECTED Final    Comment: CRITICAL RESULT CALLED TO, READ BACK BY AND VERIFIED WITH: PHARMD WILL Dareen Piano [id number] AT 1401 BY CM    A.calcoaceticus-baumannii NOT DETECTED NOT DETECTED Final   Bacteroides fragilis NOT DETECTED NOT DETECTED Final   Enterobacterales NOT DETECTED NOT  DETECTED Final   Enterobacter cloacae complex NOT DETECTED NOT DETECTED Final   Escherichia coli NOT DETECTED NOT DETECTED Final   Klebsiella aerogenes NOT DETECTED NOT DETECTED Final   Klebsiella oxytoca NOT DETECTED NOT DETECTED Final   Klebsiella pneumoniae NOT DETECTED NOT DETECTED Final   Proteus species NOT DETECTED NOT DETECTED Final   Salmonella species NOT DETECTED NOT DETECTED Final   Serratia marcescens NOT DETECTED NOT DETECTED Final    Haemophilus influenzae NOT DETECTED NOT DETECTED Final   Neisseria meningitidis NOT DETECTED NOT DETECTED Final   Pseudomonas aeruginosa NOT DETECTED NOT DETECTED Final   Stenotrophomonas maltophilia NOT DETECTED NOT DETECTED Final   Candida albicans NOT DETECTED NOT DETECTED Final   Candida auris NOT DETECTED NOT DETECTED Final   Candida glabrata NOT DETECTED NOT DETECTED Final   Candida krusei NOT DETECTED NOT DETECTED Final   Candida parapsilosis NOT DETECTED NOT DETECTED Final   Candida tropicalis NOT DETECTED NOT DETECTED Final   Cryptococcus neoformans/gattii NOT DETECTED NOT DETECTED Final    Comment: Performed at [facility], [address], [place], [place] [address]  Resp panel by RT-PCR (RSV, Flu A&B, Covid) Anterior Nasal Swab     Status: None   Collection Time: 10/01/22  7:40 PM   Specimen: Anterior Nasal Swab  Result Value Ref Range Status   SARS Coronavirus 2 by RT PCR NEGATIVE NEGATIVE Final    Comment: (NOTE) SARS-CoV-2 target nucleic acids are NOT DETECTED.  The SARS-CoV-2 RNA is generally detectable in upper respiratory specimens during the acute phase of infection. The lowest concentration of SARS-CoV-2 viral copies this assay can detect is 138 copies/mL. A negative result does not preclude SARS-Cov-2 infection and should not be used as the sole basis for treatment or other patient management decisions. A negative result may occur with  improper specimen collection/handling, submission of specimen other than nasopharyngeal swab, presence of viral mutation(s) within the areas targeted by this assay, and inadequate number of viral copies(<138 copies/mL). A negative result must be combined with clinical observations, patient history, and epidemiological information. The expected result is Negative.  Fact Sheet for Patients:  BloggerCourse.com  Fact Sheet for Healthcare Providers:  SeriousBroker.it  This  test is no t yet approved or cleared by the [place] FDA and  has been authorized for detection and/or diagnosis of SARS-CoV-2 by FDA under an Emergency Use Authorization (EUA). This EUA will remain  in effect (meaning this test can be used) for the duration of the COVID-19 declaration under Section 564(b)(1) of the Act, 21 U.S.C.section 360bbb-3(b)(1), unless the authorization is terminated  or revoked sooner.       Influenza A by PCR NEGATIVE NEGATIVE Final   Influenza B by PCR NEGATIVE NEGATIVE Final    Comment: (NOTE) The Xpert Xpress SARS-CoV-2/FLU/RSV plus assay is intended as an aid in the diagnosis of influenza from Nasopharyngeal swab specimens and should not be used as a sole basis for treatment. Nasal washings and aspirates are unacceptable for Xpert Xpress SARS-CoV-2/FLU/RSV testing.  Fact Sheet for Patients: BloggerCourse.com  Fact Sheet for Healthcare Providers: SeriousBroker.it  This test is not yet approved or cleared by the [place] FDA and has been authorized for detection and/or diagnosis of SARS-CoV-2 by FDA under an Emergency Use Authorization (EUA). This EUA will remain in effect (meaning this test can be used) for the duration of the COVID-19 declaration under Section 564(b)(1) of the Act, 21 U.S.C. section 360bbb-3(b)(1), unless the authorization is terminated or revoked.  Resp Syncytial Virus by PCR NEGATIVE NEGATIVE Final    Comment: (NOTE) Fact Sheet for Patients: BloggerCourse.com  Fact Sheet for Healthcare Providers: SeriousBroker.it  This test is not yet approved or cleared by the [place] FDA and has been authorized for detection and/or diagnosis of SARS-CoV-2 by FDA under an Emergency Use Authorization (EUA). This EUA will remain in effect (meaning this test can be used) for the duration of the COVID-19 declaration under  Section 564(b)(1) of the Act, 21 U.S.C. section 360bbb-3(b)(1), unless the authorization is terminated or revoked.  Performed at [facility], [address] [place], [place] [address]   Culture, blood (Routine X 2) w Reflex to ID Panel     Status: None (Preliminary result)   Collection Time: 10/02/22 10:01 AM   Specimen: BLOOD  Result Value Ref Range Status   Specimen Description BLOOD BLOOD RIGHT WRIST  Final   Special Requests   Final    BOTTLES DRAWN AEROBIC AND ANAEROBIC Blood Culture adequate volume   Culture   Final    NO GROWTH 3 DAYS Performed at [facility], [address] [place], [place] [address]    Report Status PENDING  Incomplete  Culture, blood (Routine X 2) w Reflex to ID Panel     Status: None (Preliminary result)   Collection Time: 10/02/22 10:03 AM   Specimen: BLOOD  Result Value Ref Range Status   Specimen Description BLOOD BLOOD LEFT HAND  Final   Special Requests   Final    BOTTLES DRAWN AEROBIC AND ANAEROBIC Blood Culture adequate volume   Culture   Final    NO GROWTH 3 DAYS Performed at [facility], [address], [place], [place] [address]    Report Status PENDING  Incomplete  Gram stain     Status: None   Collection Time: 10/03/22  2:15 PM   Specimen: Pleura  Result Value Ref Range Status   Specimen Description PLEURAL  Final   Special Requests NONE  Final   Gram Stain   Final    WBC PRESENT, PREDOMINANTLY MONONUCLEAR PLEURAL NO ORGANISMS SEEN CYTOSPIN SMEAR Performed at [facility], [address] [place], [place] [address]    Report Status 10/03/2022 FINAL  Final  Culture, body fluid w Gram Stain-bottle     Status: None (Preliminary result)   Collection Time: 10/03/22  2:15 PM   Specimen: Pleura  Result Value Ref Range Status   Specimen Description PLEURAL  Final   Special Requests 10CC BOTTLES DRAWN AEROBIC AND ANAEROBIC  Final   Culture   Final    NO GROWTH 2 DAYS Performed at [facility], [address] [place], [place]  [address]    Report Status PENDING  Incomplete         Radiology Studies: ECHOCARDIOGRAM COMPLETE  Result Date: 10/04/2022    ECHOCARDIOGRAM REPORT   Patient Name:   COLINA ZEPPIERI Date of Exam: 10/04/2022 Medical Rec #:  [id number]          Height:       64.0 in Accession #:    [id number]         Weight:       135.4 lb Date of Birth:  1932-09-29          BSA:          1.657 m Patient Age:    89 years           BP:  105/50 mmHg Patient Gender: F                  HR:           87 bpm. [facility]am [facility] Procedure: 2D Echo, Cardiac Doppler and Color Doppler Indications:    Endocarditis.  History:        Patient has prior history of Echocardiogram examinations, most                 recent 08/12/2022. CHF, Aortic Valve Disease; Risk                 Factors:Hypertension, Diabetes and Dyslipidemia. Aortic                 stenosis.  Sonographer:    Sheralyn Boatman RDCS Referring Phys: [id number] [facility]  Sonographer Comments: Image acquisition challenging due to uncooperative patient. Patient would not let me continue exam. Patient yelled out to leave her alone. Patient clinched left arm to side and yelled so I could not get to apical region. RN notified. IMPRESSIONS  1. Left ventricular ejection fraction, by estimation, is 30 to 35%. The left ventricle has moderately decreased function. The left ventricle has no regional wall motion abnormalities. There is moderate concentric left ventricular hypertrophy. Left ventricular diastolic function could not be evaluated.  2. Right ventricular systolic function was not well visualized. The right ventricular size is not well visualized.  3. A small pericardial effusion is present. The pericardial effusion is posterior to the left ventricle. Large pleural effusion in the left lateral region.  4. The mitral valve is degenerative. Mild mitral valve regurgitation. Mild mitral stenosis.  5. Tricuspid valve regurgitation is mild to moderate.  6. The aortic valve  is calcified. Aortic valve regurgitation is mild.  7. Aortic dilatation noted. There is mild dilatation of the ascending aorta, measuring 37 mm. Comparison(s): Prior images reviewed side by side. LVEF appears worse than recent study. Incomplete study due to patient's inability complete exam. Conclusion(s)/Recommendation(s): If within goals of care, consider finishing study if patient is amenable. FINDINGS  Left Ventricle: Left ventricular ejection fraction, by estimation, is 30 to 35%. The left ventricle has moderately decreased function. The left ventricle has no regional wall motion abnormalities. The left ventricular internal cavity size was normal in size. There is moderate concentric left ventricular hypertrophy. Left ventricular diastolic function could not be evaluated. Right Ventricle: The right ventricular size is not well visualized. No increase in right ventricular wall thickness. Right ventricular systolic function was not well visualized. Left Atrium: Left atrial size was not well visualized. Right Atrium: Right atrial size was not well visualized. Pericardium: A small pericardial effusion is present. The pericardial effusion is posterior to the left ventricle. Mitral Valve: The mitral valve is degenerative in appearance. Mild mitral valve regurgitation. Mild mitral valve stenosis. Tricuspid Valve: The tricuspid valve is grossly normal. Tricuspid valve regurgitation is mild to moderate. No evidence of tricuspid stenosis. Aortic Valve: The aortic valve is calcified. Aortic valve regurgitation is mild. Pulmonic Valve: The pulmonic valve was normal in structure. Pulmonic valve regurgitation is not visualized. No evidence of pulmonic stenosis. Aorta: Aortic dilatation noted. There is mild dilatation of the ascending aorta, measuring 37 mm. IAS/Shunts: No atrial level shunt detected by color flow Doppler. Additional Comments: There is a large pleural effusion in the left lateral region.  LEFT VENTRICLE PLAX  2D LVIDd:         4.50 cm LVIDs:  3.90 cm LV PW:         1.40 cm LV IVS:        1.40 cm LVOT diam:     2.30 cm LVOT Area:     4.15 cm  LEFT ATRIUM         Index LA diam:    4.20 cm 2.53 cm/m   AORTA Ao Root diam: 3.30 cm Ao Asc diam:  3.70 cm TRICUSPID VALVE TR Peak grad:   36.0 mmHg TR Vmax:        300.00 cm/s  SHUNTS Systemic Diam: 2.30 cm Riley Lam MD Electronically signed by Riley Lam MD Signature Date/Time: 10/04/2022/1:41:53 PM    Final    US THORACENTESIS ASP PLEURAL SPACE W/IMG GUIDE  Result Date: 10/03/2022 INDICATION: Right pleural effusion.  Hypoxia. EXAM: ULTRASOUND GUIDED right THORACENTESIS MEDICATIONS: None. COMPLICATIONS: None immediate. PROCEDURE: An ultrasound guided thoracentesis was thoroughly discussed with the patient and questions answered. The benefits, risks, alternatives and complications were also discussed. The patient understands and wishes to proceed with the procedure. Written consent was obtained. Ultrasound was performed to localize and mark an adequate pocket of fluid in the right chest. The area was then prepped and draped in the normal sterile fashion. 1% Lidocaine was used for local anesthesia. Under ultrasound guidance a 6 Fr Safe-T-Centesis catheter was introduced. Thoracentesis was performed. The catheter was removed and a dressing applied. FINDINGS: A total of approximately 350 mL of clear yellow fluid was removed. Samples were sent to the laboratory as requested by the clinical team. IMPRESSION: Successful ultrasound guided right thoracentesis yielding 350 mL of pleural fluid. Electronically Signed   By: Marin Roberts M.D.   On: 10/03/2022 15:19   DG Chest 1 View  Result Date: 10/03/2022 CLINICAL DATA:  Status post thoracentesis. EXAM: CHEST  1 VIEW COMPARISON:  None Available. FINDINGS: The heart is enlarged. Atherosclerotic changes are present at the aortic arch. Mild pulmonary vascular congestion is present. The right pleural  effusion is decreased. No pneumothorax is present. IMPRESSION: 1. Decreased right pleural effusion without pneumothorax. 2. Cardiomegaly and mild pulmonary vascular congestion. Electronically Signed   By: Marin Roberts M.D.   On: 10/03/2022 15:03        Scheduled Meds:  (feeding supplement) PROSource Plus  30 mL Oral BID BM   feeding supplement (GLUCERNA SHAKE)  237 mL Oral TID BM   heparin  5,000 Units Subcutaneous Q8H   insulin aspart  0-9 Units Subcutaneous Q4H   methylPREDNISolone (SOLU-MEDROL) injection  20 mg Intravenous Daily   multivitamin with minerals  1 tablet Oral Q lunch   pantoprazole (PROTONIX) IV  40 mg Intravenous QAC breakfast   Continuous Infusions:  linezolid (ZYVOX) IV 600 mg (10/04/22 2323)   pencillin G potassium IV 4 Million Units (10/05/22 0520)     LOS: 4 days    Time spent: 35 minutes     Hoover Brunette, DO [facility]  If 7PM-7AM, please contact night-coverage [email or web address] 10/05/2022, 10:39 AM

## 2022-10-05 NOTE — Progress Notes (Signed)
Nutrition Brief Note:   MD consult to initiate EN. Tube placement unsuccessful again today. Per MD note, pt's family has decided to switch pt over to DNR status. RD team is already following patient and will continue to follow up per protocol.   Bethann Humble, RD, LDN, CNSC.

## 2022-10-05 NOTE — Progress Notes (Signed)
Secure chat clarification with Dr Sherryll Burger and Dr Jarold Song re PICC placement, GFR and no hx CKD.  Decision to hold on PICC at this time until consult with family members re goals of care.  PICC canceled.

## 2022-10-06 DIAGNOSIS — R7881 Bacteremia: Secondary | ICD-10-CM | POA: Diagnosis not present

## 2022-10-06 DIAGNOSIS — J189 Pneumonia, unspecified organism: Secondary | ICD-10-CM | POA: Diagnosis not present

## 2022-10-06 DIAGNOSIS — L03116 Cellulitis of left lower limb: Secondary | ICD-10-CM | POA: Diagnosis not present

## 2022-10-06 LAB — GLUCOSE, CAPILLARY
Glucose-Capillary: 226 mg/dL — ABNORMAL HIGH (ref 70–99)
Glucose-Capillary: 164 mg/dL — ABNORMAL HIGH (ref 70–99)
Glucose-Capillary: 160 mg/dL — ABNORMAL HIGH (ref 70–99)
Glucose-Capillary: 183 mg/dL — ABNORMAL HIGH (ref 70–99)
Glucose-Capillary: 222 mg/dL — ABNORMAL HIGH (ref 70–99)

## 2022-10-06 MED ORDER — METHYLPREDNISOLONE SODIUM SUCC 40 MG IJ SOLR
10.0000 mg | Freq: Every day | INTRAMUSCULAR | Status: DC
  Administered 2022-10-07: 10 mg via INTRAVENOUS
  Filled 2022-10-06: qty 1

## 2022-10-06 MED ORDER — ENSURE ENLIVE PO LIQD
237.0000 mL | Freq: Three times a day (TID) | ORAL | Status: DC
  Administered 2022-10-07 – 2022-10-08 (×5): 237 mL via ORAL

## 2022-10-06 NOTE — Plan of Care (Signed)

## 2022-10-06 NOTE — Progress Notes (Signed)
PROGRESS NOTE    MIKYRA NIVENS  WUJ:[id number] DOB: May 07, 1932 DOA: 10/01/2022 PCP: Lindaann Pascal   Brief Narrative:    KAYLEIGHANN PATCH is a 89 y.o. female with medical history significant for systolic and diastolic CHF, diabetes mellitus, dementia, hypertension. Patient was brought to the ED via EMS for reports of pain to the left foot, patient unable to ambulate.  She has also noticed increased work of breathing from her baseline as well as worsening chronic cough.  She was admitted for multifocal pneumonia with bilateral pleural effusions and started on IV antibiotics.  Patient is not eating and did not tolerate NG tube placement today.  Overall appears to be declining.  Assessment & Plan:   Principal Problem:   Multifocal pneumonia Active Problems:   Acute hypoxic respiratory failure (HCC)   Left leg cellulitis   Type 2 diabetes mellitus (HCC)   Chronic combined systolic and diastolic CHF (congestive heart failure) (HCC)   Secondary DM with CKD stage 4 and hypertension (HCC)   Vascular dementia without behavioral disturbance (HCC)   History of gout   Essential hypertension   Acquired hypothyroidism  Assessment and Plan:   Strep bacteremia in the setting of cellulitis Pneumonia with acute hypoxic respiratory failure.  CTA chest showing multifocal pneumonia, also with moderate right and small left pleural effusion.  Significant leukocytosis of 29.6.  Lactic acid 2.3 > 2. -IV penicillin G and linezolid per ID -Need to confirm indication for Eliquis, no documentation in chart- (Eliquis held for now pending Ct findings) called patient's granddaughter Shirlean Mylar back to ask, no response. -Thoracentesis performed 8/9 with no findings of infection noted -Change linezolid to IV since not tolerating oral for 4 doses -Transthoracic echocardiogram with no signs of vegetations and now reduced LVEF of 30-35% compared to 50-55% previously and small pericardial effusion -Repeat TTE  results pending per ID with need for at least 2-week course of antibiotics, hold off on PICC line for now until it is certain that clinical condition is improving   Left leg cellulitis Increasing pain, tenderness on exam, with swelling and erythema, chronic nonhealing ulcer with minimal drainage. Significant leukocytosis of 29.6.  History of gout.  Rules out for sepsis. - Obtain CT of the left foot considering significant leukocytosis -Continue on penicillin G and linezolid per ID  Acute on chronic anemia No overt bleeding identified Plan to transfuse 1 unit PRBC 8/9  Acute hypoxic respiratory failure (HCC) O2 sats down to 85% on room air, likely due to multifocal pneumonia, and moderate right pleural effusion, small left pleural effusion.   Acute gout Left foot swelling, likely cellulitis, but also possibly gout.  Not on medication for gout. -No longer on chronic medications -Continue IV Solu-Medrol, start to taper dose -Uric acid and CRP elevated     Vascular dementia without behavioral disturbance (HCC) Stable.  Nursing home resident.  Baseline most times able to recognize family, answer simple questions, significant short-term memory loss.   Secondary DM with CKD stage 4 and hypertension (HCC) Creatinine starting to elevate, but should improve with PRBC transfusion Monitor strict I's and O's Avoid nephrotoxic agents Started on gentle IV fluids since not tolerating p.o. intake   Chronic combined systolic and diastolic CHF (congestive heart failure) (HCC) No peripheral signs of volume overload.  CT showing pleural effusions.   Type 2 diabetes mellitus (HCC) with steroid-induced hyperglycemia A1c 7.7. - SSI- S every 4 hours with noted hyperglycemia in the setting of steroid use -Hold linagliptin -Start weaning  steroids   Acquired hypothyroidism Resume Synthroid   Essential hypertension Continue to hold home blood pressure agents for now and monitor   DVT  prophylaxis:Heparin Code Status: DNR Family Communication: Granddaughter at bedside 8/12 Disposition Plan: Continue treatment for pneumonia with IV antibiotics Status is: Inpatient Remains inpatient appropriate because: Need for IV medications.   Consultants:  Palliative care ID  Procedures:  NG tube placement attempted 8/10 and patient did not tolerate  Antimicrobials:  Anti-infectives (From admission, onward)    Start     Dose/Rate Route Frequency Ordered Stop   10/04/22 1345  linezolid (ZYVOX) IVPB 600 mg        600 mg 300 mL/hr over 60 Minutes Intravenous Every 12 hours 10/04/22 1254 10/06/22 0727   10/03/22 2200  vancomycin (VANCOREADY) IVPB 750 mg/150 mL  Status:  Discontinued        750 mg 150 mL/hr over 60 Minutes Intravenous Every 48 hours 10/02/22 1041 10/02/22 1507   10/03/22 1245  linezolid (ZYVOX) tablet 600 mg  Status:  Discontinued        600 mg Oral Every 12 hours 10/03/22 1151 10/04/22 1254   10/03/22 1200  Ampicillin-Sulbactam (UNASYN) 3 g in sodium chloride 0.9 % 100 mL IVPB  Status:  Discontinued        3 g 200 mL/hr over 30 Minutes Intravenous Every 12 hours 10/03/22 1058 10/03/22 1151   10/03/22 1200  penicillin G potassium 4 Million Units in dextrose 5 % 250 mL IVPB        4 Million Units 250 mL/hr over 60 Minutes Intravenous Every 8 hours 10/03/22 1151     10/02/22 1800  ceFEPIme (MAXIPIME) 2 g in sodium chloride 0.9 % 100 mL IVPB  Status:  Discontinued        2 g 200 mL/hr over 30 Minutes Intravenous Every 24 hours 10/01/22 1759 10/01/22 2140   10/02/22 1800  cefTRIAXone (ROCEPHIN) 2 g in sodium chloride 0.9 % 100 mL IVPB  Status:  Discontinued        2 g 200 mL/hr over 30 Minutes Intravenous Every 24 hours 10/01/22 2140 10/02/22 1507   10/02/22 1700  Ampicillin-Sulbactam (UNASYN) 3 g in sodium chloride 0.9 % 100 mL IVPB  Status:  Discontinued        3 g 200 mL/hr over 30 Minutes Intravenous Every 24 hours 10/02/22 1510 10/03/22 1058   10/01/22 2230   doxycycline (VIBRAMYCIN) 100 mg in sodium chloride 0.9 % 250 mL IVPB  Status:  Discontinued        100 mg 125 mL/hr over 120 Minutes Intravenous Every 12 hours 10/01/22 2140 10/02/22 1507   10/01/22 1801  vancomycin variable dose per unstable renal function (pharmacist dosing)  Status:  Discontinued         Does not apply See admin instructions 10/01/22 1802 10/02/22 1507   10/01/22 1800  ceFEPIme (MAXIPIME) 2 g in sodium chloride 0.9 % 100 mL IVPB        2 g 200 mL/hr over 30 Minutes Intravenous  Once 10/01/22 1746 10/01/22 2036   10/01/22 1800  metroNIDAZOLE (FLAGYL) IVPB 500 mg        500 mg 100 mL/hr over 60 Minutes Intravenous  Once 10/01/22 1746 10/01/22 2139   10/01/22 1800  vancomycin (VANCOCIN) IVPB 1000 mg/200 mL premix        1,000 mg 200 mL/hr over 60 Minutes Intravenous  Once 10/01/22 1746 10/01/22 2323   10/01/22 1745  vancomycin (VANCOCIN) IVPB 1000  mg/200 mL premix  Status:  Discontinued        1,000 mg 200 mL/hr over 60 Minutes Intravenous  Once 10/01/22 1743 10/01/22 1744   10/01/22 1745  ceFEPIme (MAXIPIME) 2 g in sodium chloride 0.9 % 100 mL IVPB  Status:  Discontinued        2 g 200 mL/hr over 30 Minutes Intravenous  Once 10/01/22 1743 10/01/22 1744       Subjective: Patient seen and evaluated today with improvement in pain complaints noted.  Try to administer diet as tolerated.  Encouraged eating.  Objective: Vitals:   10/05/22 0900 10/05/22 1343 10/05/22 2159 10/06/22 0356  BP: (!) 156/61 (!) 164/85 (!) 156/67 (!) 144/60  Pulse: 75 81 77 69  Resp: 17 18 18 19   Temp: 97.7 F (36.5 C) 97.9 F (36.6 C) 98.2 F (36.8 C) 97.6 F (36.4 C)  TempSrc: Oral Oral Axillary Oral  SpO2: 100% 100% 100% 98%  Weight:        Intake/Output Summary (Last 24 hours) at 10/06/2022 1109 Last data filed at 10/05/2022 1921 Gross per 24 hour  Intake 100 ml  Output --  Net 100 ml   Filed Weights   10/01/22 1745 10/01/22 2121  Weight: 55 kg 61.4 kg     Examination:  General exam: Mostly somnolent and intermittently agitated Respiratory system: Clear to auscultation. Respiratory effort normal. Cardiovascular system: S1 & S2 heard, RRR.  Gastrointestinal system: Abdomen is soft Central nervous system: Mostly somnolent Extremities: No edema Skin: No significant lesions noted    Data Reviewed: I have personally reviewed following labs and imaging studies  CBC: Recent Labs  Lab 10/01/22 1642 10/02/22 0405 10/03/22 0413 10/03/22 1236 10/04/22 0504 10/05/22 0517 10/06/22 0450  WBC 29.6* 29.1* 27.1*  --  26.8* 27.8* 21.0*  NEUTROABS 27.8*  --   --   --   --   --   --   HGB 7.5* 7.1* 6.4* 8.1* 8.3* 9.3* 9.2*  HCT 24.3* 23.2* 20.9* 25.4* 26.2* 29.3* 28.9*  MCV 83.5 82.6 81.6  --  84.2 84.0 83.3  PLT 170 165 156  --  153 186 206   Basic Metabolic Panel: Recent Labs  Lab 10/02/22 0405 10/03/22 0413 10/04/22 0504 10/05/22 0517 10/06/22 0450  NA 136 137 140 135 136  K 4.6 4.5 4.7 4.8 4.4  CL 106 109 111 107 106  CO2 19* 18* 19* 18* 19*  GLUCOSE 146* 138* 180* 303* 182*  BUN 45* 49* 54* 59* 62*  CREATININE 2.75* 2.99* 2.96* 2.92* 2.88*  CALCIUM 8.6* 8.4* 8.7* 8.4* 8.7*  MG  --  2.0 2.1 2.3 2.3   GFR: Estimated Creatinine Clearance: 11.9 mL/min (A) (by C-G formula based on SCr of 2.88 mg/dL (H)). Liver Function Tests: Recent Labs  Lab 10/01/22 1642  AST 18  ALT 13  ALKPHOS 132*  BILITOT 0.5  PROT 6.4*  ALBUMIN 2.4*   No results for input(s): "LIPASE", "AMYLASE" in the last 168 hours. No results for input(s): "AMMONIA" in the last 168 hours. Coagulation Profile: Recent Labs  Lab 10/01/22 1642  INR 1.7*   Cardiac Enzymes: No results for input(s): "CKTOTAL", "CKMB", "CKMBINDEX", "TROPONINI" in the last 168 hours. BNP (last 3 results) No results for input(s): "PROBNP" in the last 8760 hours. HbA1C: No results for input(s): "HGBA1C" in the last 72 hours. CBG: Recent Labs  Lab 10/05/22 1659  10/05/22 2050 10/06/22 0109 10/06/22 0457 10/06/22 0733  GLUCAP 263* 209* 226* 164* 160*  Lipid Profile: No results for input(s): "CHOL", "HDL", "LDLCALC", "TRIG", "CHOLHDL", "LDLDIRECT" in the last 72 hours. Thyroid Function Tests: No results for input(s): "TSH", "T4TOTAL", "FREET4", "T3FREE", "THYROIDAB" in the last 72 hours. Anemia Panel: No results for input(s): "VITAMINB12", "FOLATE", "FERRITIN", "TIBC", "IRON", "RETICCTPCT" in the last 72 hours. Sepsis Labs: Recent Labs  Lab 10/01/22 1642 10/01/22 1837  LATICACIDVEN 2.3* 2.0*    Recent Results (from the past 240 hour(s))  Culture, blood (Routine X 2) w Reflex to ID Panel     Status: None   Collection Time: 10/01/22  4:42 PM   Specimen: Right Antecubital; Blood  Result Value Ref Range Status   Specimen Description RIGHT ANTECUBITAL  Final   Special Requests   Final    BOTTLES DRAWN AEROBIC AND ANAEROBIC Blood Culture adequate volume   Culture   Final    NO GROWTH 5 DAYS Performed at [facility], [address], [place], [place] [address]    Report Status 10/06/2022 FINAL  Final  Culture, blood (Routine X 2) w Reflex to ID Panel     Status: Abnormal (Preliminary result)   Collection Time: 10/01/22  4:58 PM   Specimen: Left Antecubital; Blood  Result Value Ref Range Status   Specimen Description   Final    LEFT ANTECUBITAL Performed at [facility], [address] [place], [place] [address]    Special Requests   Final    BOTTLES DRAWN AEROBIC AND ANAEROBIC Blood Culture adequate volume Performed at [facility], [address], [place], [place] [address]    Culture  Setup Time   Final    GRAM POSITIVE COCCI IN CHAINS IN BOTH AEROBIC AND ANAEROBIC BOTTLES Gram Stain Report Called to,Read Back By and Verified With: CATES LAND, MICHELLE @ 0900 ON 10/02/2022 BY FRATTO,ASHLEY CRITICAL RESULT CALLED TO, READ BACK BY AND VERIFIED WITH: PHARMD WILL Dareen Piano [id number] AT 1401 BY CM    Culture (A)  Final    STREPTOCOCCUS  PYOGENES HEALTH DEPARTMENT NOTIFIED Performed at [facility], [address] [place], [place] [address]    Report Status PENDING  Incomplete   Organism ID, Bacteria STREPTOCOCCUS PYOGENES  Final      Susceptibility   Streptococcus pyogenes - MIC*    PENICILLIN <=0.06 SENSITIVE Sensitive     CEFTRIAXONE <=0.12 SENSITIVE Sensitive     ERYTHROMYCIN 4 RESISTANT Resistant     LEVOFLOXACIN <=0.25 SENSITIVE Sensitive     VANCOMYCIN 0.5 SENSITIVE Sensitive     * STREPTOCOCCUS PYOGENES  Blood Culture ID Panel (Reflexed)     Status: Abnormal   Collection Time: 10/01/22  4:58 PM  Result Value Ref Range Status   Enterococcus faecalis NOT DETECTED NOT DETECTED Final   Enterococcus Faecium NOT DETECTED NOT DETECTED Final   Listeria monocytogenes NOT DETECTED NOT DETECTED Final   Staphylococcus species NOT DETECTED NOT DETECTED Final   Staphylococcus aureus (BCID) NOT DETECTED NOT DETECTED Final   Staphylococcus epidermidis NOT DETECTED NOT DETECTED Final   Staphylococcus lugdunensis NOT DETECTED NOT DETECTED Final   Streptococcus species DETECTED (A) NOT DETECTED Final    Comment: CRITICAL RESULT CALLED TO, READ BACK BY AND VERIFIED WITH: PHARMD WILL Dareen Piano [id number] AT 1401 BY CM    Streptococcus agalactiae NOT DETECTED NOT DETECTED Final   Streptococcus pneumoniae NOT DETECTED NOT DETECTED Final   Streptococcus pyogenes DETECTED (A) NOT DETECTED Final    Comment: CRITICAL RESULT CALLED TO, READ BACK BY AND VERIFIED WITH: [facility] WILL ANDERSON [id number] AT 1401 BY  CM    A.calcoaceticus-baumannii NOT DETECTED NOT DETECTED Final   Bacteroides fragilis NOT DETECTED NOT DETECTED Final   Enterobacterales NOT DETECTED NOT DETECTED Final   Enterobacter cloacae complex NOT DETECTED NOT DETECTED Final   Escherichia coli NOT DETECTED NOT DETECTED Final   Klebsiella aerogenes NOT DETECTED NOT DETECTED Final   Klebsiella oxytoca NOT DETECTED NOT DETECTED Final   Klebsiella pneumoniae NOT DETECTED  NOT DETECTED Final   Proteus species NOT DETECTED NOT DETECTED Final   Salmonella species NOT DETECTED NOT DETECTED Final   Serratia marcescens NOT DETECTED NOT DETECTED Final   Haemophilus influenzae NOT DETECTED NOT DETECTED Final   Neisseria meningitidis NOT DETECTED NOT DETECTED Final   Pseudomonas aeruginosa NOT DETECTED NOT DETECTED Final   Stenotrophomonas maltophilia NOT DETECTED NOT DETECTED Final   Candida albicans NOT DETECTED NOT DETECTED Final   Candida auris NOT DETECTED NOT DETECTED Final   Candida glabrata NOT DETECTED NOT DETECTED Final   Candida krusei NOT DETECTED NOT DETECTED Final   Candida parapsilosis NOT DETECTED NOT DETECTED Final   Candida tropicalis NOT DETECTED NOT DETECTED Final   Cryptococcus neoformans/gattii NOT DETECTED NOT DETECTED Final    Comment: Performed at [facility], [address] [place], [place] [address]  Resp panel by RT-PCR (RSV, Flu A&B, Covid) Anterior Nasal Swab     Status: None   Collection Time: 10/01/22  7:40 PM   Specimen: Anterior Nasal Swab  Result Value Ref Range Status   SARS Coronavirus 2 by RT PCR NEGATIVE NEGATIVE Final    Comment: (NOTE) SARS-CoV-2 target nucleic acids are NOT DETECTED.  The SARS-CoV-2 RNA is generally detectable in upper respiratory specimens during the acute phase of infection. The lowest concentration of SARS-CoV-2 viral copies this assay can detect is 138 copies/mL. A negative result does not preclude SARS-Cov-2 infection and should not be used as the sole basis for treatment or other patient management decisions. A negative result may occur with  improper specimen collection/handling, submission of specimen other than nasopharyngeal swab, presence of viral mutation(s) within the areas targeted by this assay, and inadequate number of viral copies(<138 copies/mL). A negative result must be combined with clinical observations, patient history, and epidemiological information. The expected  result is Negative.  Fact Sheet for Patients:  BloggerCourse.com  Fact Sheet for Healthcare Providers:  SeriousBroker.it  This test is no t yet approved or cleared by the [place] FDA and  has been authorized for detection and/or diagnosis of SARS-CoV-2 by FDA under an Emergency Use Authorization (EUA). This EUA will remain  in effect (meaning this test can be used) for the duration of the COVID-19 declaration under Section 564(b)(1) of the Act, 21 U.S.C.section 360bbb-3(b)(1), unless the authorization is terminated  or revoked sooner.       Influenza A by PCR NEGATIVE NEGATIVE Final   Influenza B by PCR NEGATIVE NEGATIVE Final    Comment: (NOTE) The Xpert Xpress SARS-CoV-2/FLU/RSV plus assay is intended as an aid in the diagnosis of influenza from Nasopharyngeal swab specimens and should not be used as a sole basis for treatment. Nasal washings and aspirates are unacceptable for Xpert Xpress SARS-CoV-2/FLU/RSV testing.  Fact Sheet for Patients: BloggerCourse.com  Fact Sheet for Healthcare Providers: SeriousBroker.it  This test is not yet approved or cleared by the [place] FDA and has been authorized for detection and/or diagnosis of SARS-CoV-2 by FDA under an Emergency Use Authorization (EUA). This EUA will remain in effect (meaning this test can be  used) for the duration of the COVID-19 declaration under Section 564(b)(1) of the Act, 21 U.S.C. section 360bbb-3(b)(1), unless the authorization is terminated or revoked.     Resp Syncytial Virus by PCR NEGATIVE NEGATIVE Final    Comment: (NOTE) Fact Sheet for Patients: BloggerCourse.com  Fact Sheet for Healthcare Providers: SeriousBroker.it  This test is not yet approved or cleared by the [place] FDA and has been authorized for detection and/or diagnosis of  SARS-CoV-2 by FDA under an Emergency Use Authorization (EUA). This EUA will remain in effect (meaning this test can be used) for the duration of the COVID-19 declaration under Section 564(b)(1) of the Act, 21 U.S.C. section 360bbb-3(b)(1), unless the authorization is terminated or revoked.  Performed at [facility], [address], [place], [place] [address]   Culture, blood (Routine X 2) w Reflex to ID Panel     Status: None (Preliminary result)   Collection Time: 10/02/22 10:01 AM   Specimen: BLOOD  Result Value Ref Range Status   Specimen Description BLOOD BLOOD RIGHT WRIST  Final   Special Requests   Final    BOTTLES DRAWN AEROBIC AND ANAEROBIC Blood Culture adequate volume   Culture   Final    NO GROWTH 4 DAYS Performed at [facility], [address] [place], [place] [address]    Report Status PENDING  Incomplete  Culture, blood (Routine X 2) w Reflex to ID Panel     Status: None (Preliminary result)   Collection Time: 10/02/22 10:03 AM   Specimen: BLOOD  Result Value Ref Range Status   Specimen Description BLOOD BLOOD LEFT HAND  Final   Special Requests   Final    BOTTLES DRAWN AEROBIC AND ANAEROBIC Blood Culture adequate volume   Culture   Final    NO GROWTH 4 DAYS Performed at [facility], [address] [place], [place] [address]    Report Status PENDING  Incomplete  Gram stain     Status: None   Collection Time: 10/03/22  2:15 PM   Specimen: Pleura  Result Value Ref Range Status   Specimen Description PLEURAL  Final   Special Requests NONE  Final   Gram Stain   Final    WBC PRESENT, PREDOMINANTLY MONONUCLEAR PLEURAL NO ORGANISMS SEEN CYTOSPIN SMEAR Performed at [facility], [address] [place], [place] [address]    Report Status 10/03/2022 FINAL  Final  Culture, body fluid w Gram Stain-bottle     Status: None (Preliminary result)   Collection Time: 10/03/22  2:15 PM   Specimen: Pleura  Result Value Ref Range Status   Specimen Description PLEURAL   Final   Special Requests 10CC BOTTLES DRAWN AEROBIC AND ANAEROBIC  Final   Culture   Final    NO GROWTH 3 DAYS Performed at [facility], [address] [place], [place] [address]    Report Status PENDING  Incomplete         Radiology Studies: [place] EKG SITE RITE  Result Date: 10/05/2022 If Site Rite image not attached, placement could not be confirmed due to current cardiac rhythm.  ECHOCARDIOGRAM COMPLETE  Result Date: 10/04/2022    ECHOCARDIOGRAM REPORT   Patient Name:   ROSEALIE PAXSON Date of Exam: 10/04/2022 Medical Rec #:  [id number]          Height:       64.0 in Accession #:    [id number]         Weight:       135.4  lb Date of Birth:  [birth year]          BSA:          1.657 m Patient Age:    89 years           BP:           105/50 mmHg Patient Gender: F                  HR:           87 bpm. [facility]am [facility] Procedure: 2D Echo, Cardiac Doppler and Color Doppler Indications:    Endocarditis.  History:        Patient has prior history of Echocardiogram examinations, most                 recent 08/12/2022. CHF, Aortic Valve Disease; Risk                 Factors:Hypertension, Diabetes and Dyslipidemia. Aortic                 stenosis.  Sonographer:    Sheralyn Boatman RDCS Referring Phys: [id number] [facility]  Sonographer Comments: Image acquisition challenging due to uncooperative patient. Patient would not let me continue exam. Patient yelled out to leave her alone. Patient clinched left arm to side and yelled so I could not get to apical region. RN notified. IMPRESSIONS  1. Left ventricular ejection fraction, by estimation, is 30 to 35%. The left ventricle has moderately decreased function. The left ventricle has no regional wall motion abnormalities. There is moderate concentric left ventricular hypertrophy. Left ventricular diastolic function could not be evaluated.  2. Right ventricular systolic function was not well visualized. The right ventricular size is not well visualized.  3. A  small pericardial effusion is present. The pericardial effusion is posterior to the left ventricle. Large pleural effusion in the left lateral region.  4. The mitral valve is degenerative. Mild mitral valve regurgitation. Mild mitral stenosis.  5. Tricuspid valve regurgitation is mild to moderate.  6. The aortic valve is calcified. Aortic valve regurgitation is mild.  7. Aortic dilatation noted. There is mild dilatation of the ascending aorta, measuring 37 mm. Comparison(s): Prior images reviewed side by side. LVEF appears worse than recent study. Incomplete study due to patient's inability complete exam. Conclusion(s)/Recommendation(s): If within goals of care, consider finishing study if patient is amenable. FINDINGS  Left Ventricle: Left ventricular ejection fraction, by estimation, is 30 to 35%. The left ventricle has moderately decreased function. The left ventricle has no regional wall motion abnormalities. The left ventricular internal cavity size was normal in size. There is moderate concentric left ventricular hypertrophy. Left ventricular diastolic function could not be evaluated. Right Ventricle: The right ventricular size is not well visualized. No increase in right ventricular wall thickness. Right ventricular systolic function was not well visualized. Left Atrium: Left atrial size was not well visualized. Right Atrium: Right atrial size was not well visualized. Pericardium: A small pericardial effusion is present. The pericardial effusion is posterior to the left ventricle. Mitral Valve: The mitral valve is degenerative in appearance. Mild mitral valve regurgitation. Mild mitral valve stenosis. Tricuspid Valve: The tricuspid valve is grossly normal. Tricuspid valve regurgitation is mild to moderate. No evidence of tricuspid stenosis. Aortic Valve: The aortic valve is calcified. Aortic valve regurgitation is mild. Pulmonic Valve: The pulmonic valve was normal in structure. Pulmonic valve regurgitation  is not visualized. No evidence of pulmonic stenosis. Aorta: Aortic  dilatation noted. There is mild dilatation of the ascending aorta, measuring 37 mm. IAS/Shunts: No atrial level shunt detected by color flow Doppler. Additional Comments: There is a large pleural effusion in the left lateral region.  LEFT VENTRICLE PLAX 2D LVIDd:         4.50 cm LVIDs:         3.90 cm LV PW:         1.40 cm LV IVS:        1.40 cm LVOT diam:     2.30 cm LVOT Area:     4.15 cm  LEFT ATRIUM         Index LA diam:    4.20 cm 2.53 cm/m   AORTA Ao Root diam: 3.30 cm Ao Asc diam:  3.70 cm TRICUSPID VALVE TR Peak grad:   36.0 mmHg TR Vmax:        300.00 cm/s  SHUNTS Systemic Diam: 2.30 cm Riley Lam MD Electronically signed by Riley Lam MD Signature Date/Time: 10/04/2022/1:41:53 PM    Final         Scheduled Meds:  (feeding supplement) PROSource Plus  30 mL Oral TID BM   feeding supplement (GLUCERNA SHAKE)  237 mL Oral TID BM   heparin  5,000 Units Subcutaneous Q8H   insulin aspart  0-9 Units Subcutaneous Q4H   [START ON 10/07/2022] methylPREDNISolone (SOLU-MEDROL) injection  10 mg Intravenous Daily   multivitamin with minerals  1 tablet Oral Q lunch   pantoprazole (PROTONIX) IV  40 mg Intravenous QAC breakfast   Continuous Infusions:  pencillin G potassium IV 4 Million Units (10/06/22 0617)     LOS: 5 days    Time spent: 35 minutes     Hoover Brunette, DO [facility]  If 7PM-7AM, please contact night-coverage [email or web address] 10/06/2022, 11:09 AM

## 2022-10-06 NOTE — Progress Notes (Signed)
[facility]  Date of Admission:  10/01/2022       Lines: Peripheral iv's  Abx: 8/9-c Penicillin g  8/10-12 linezolid  8/8-9 amp/sulb 8/7-8 doxy 8/7-8 vanc 8/7 cefepime  ASSESSMENT: Group a strep bsi Left foot cellulitis ?ards    Low burden bsi Repeat bcx ngtd  Chest imaging ?aspiration vs ards vs less likely septic emboli  Tte incomplete, pending repeat   Failure to thrive picture. Goals of care in discussion    PLAN: Awaiting repeat tte Continue iv abx.  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)   Vascular dementia without behavioral disturbance (HCC)   History of gout   Left leg cellulitis   Allergies  Allergen Reactions   Codeine Nausea And Vomiting    Scheduled Meds:  (feeding supplement) PROSource Plus  30 mL Oral TID BM   feeding supplement  237 mL Oral TID BM   heparin  5,000 Units Subcutaneous Q8H   insulin aspart  0-9 Units Subcutaneous Q4H   [START ON 10/07/2022] methylPREDNISolone (SOLU-MEDROL) injection  10 mg Intravenous Daily   multivitamin with minerals  1 tablet Oral Q lunch   pantoprazole (PROTONIX) IV  40 mg Intravenous QAC breakfast   Continuous Infusions:  pencillin G potassium IV 4 Million Units (10/06/22 0617)   PRN Meds:.acetaminophen **OR** acetaminophen, HYDROmorphone (DILAUDID)  injection, polyethylene glycol   SUBJECTIVE: Chart reviewed  Not eating well  Tte incomplete  Wbc down Afebrile   Review of Systems: ROS All other ROS was negative, except mentioned above     OBJECTIVE: Vitals:   10/05/22 0900 10/05/22 1343 10/05/22 2159 10/06/22 0356  BP: (!) 156/61 (!) 164/85 (!) 156/67 (!) 144/60  Pulse: 75 81 77 69  Resp: 17 18 18 19   Temp: 97.7 F (36.5 C) 97.9 F (36.6 C) 98.2 F (36.8 C) 97.6 F (36.4 C)  TempSrc: Oral Oral Axillary Oral  SpO2: 100% 100% 100% 98%  Weight:       Body mass index is 23.23 kg/m.  Physical Exam teleconsult  Lab Results Lab Results  Component Value Date   WBC 21.0 (H) 10/06/2022   HGB 9.2 (L) 10/06/2022   HCT 28.9 (L) 10/06/2022   MCV 83.3 10/06/2022   PLT 206 10/06/2022    Lab Results  Component Value Date   CREATININE 2.88 (H) 10/06/2022   BUN 62 (H) 10/06/2022   NA 136 10/06/2022   K 4.4 10/06/2022   CL 106 10/06/2022   CO2 19 (L) 10/06/2022    Lab Results  Component Value Date   ALT 13 10/01/2022   AST 18 10/01/2022   ALKPHOS 132 (H) 10/01/2022   BILITOT 0.5 10/01/2022      Microbiology: Recent Results (from the past 240 hour(s))  Culture, blood (Routine X 2) w Reflex to ID Panel     Status: None  Collection Time: 10/01/22  4:42 PM   Specimen: Right Antecubital; Blood  Result Value Ref Range Status   Specimen Description RIGHT ANTECUBITAL  Final   Special Requests   Final    BOTTLES DRAWN AEROBIC AND ANAEROBIC Blood Culture adequate volume   Culture   Final    NO GROWTH 5 DAYS Performed at [facility], [address] [place] [address], [place], [place] [address]    Report Status 10/06/2022 FINAL  Final  Culture, blood (Routine X 2) w Reflex to ID Panel     Status: Abnormal (Preliminary result)   Collection Time: 10/01/22  4:58 PM   Specimen: Left Antecubital; Blood  Result Value Ref Range Status   Specimen Description   Final    LEFT ANTECUBITAL Performed at [facility], [address], [place], [place] [address]    Special Requests   Final    BOTTLES DRAWN AEROBIC AND ANAEROBIC Blood Culture adequate volume Performed at [facility], [address], [place], [place] [address]    Culture  Setup Time   Final    GRAM POSITIVE COCCI IN CHAINS IN BOTH AEROBIC AND ANAEROBIC BOTTLES Gram Stain Report Called to,Read Back By and Verified With: CATES LAND, MICHELLE @ 0900 ON 10/02/2022 BY FRATTO,ASHLEY CRITICAL RESULT CALLED TO, READ BACK BY AND VERIFIED WITH: PHARMD WILL Dareen Piano [id number] AT 1401 BY CM    Culture (A)  Final    STREPTOCOCCUS PYOGENES HEALTH DEPARTMENT NOTIFIED Performed at [facility], [address], [place], [place] [address]    Report Status PENDING  Incomplete   Organism ID, Bacteria STREPTOCOCCUS PYOGENES  Final      Susceptibility   Streptococcus pyogenes - MIC*    PENICILLIN <=0.06 SENSITIVE Sensitive     CEFTRIAXONE <=0.12 SENSITIVE Sensitive     ERYTHROMYCIN 4 RESISTANT Resistant     LEVOFLOXACIN <=0.25 SENSITIVE Sensitive     VANCOMYCIN 0.5 SENSITIVE Sensitive     * STREPTOCOCCUS PYOGENES  Blood Culture ID Panel (Reflexed)     Status: Abnormal   Collection Time: 10/01/22  4:58 PM  Result Value Ref Range Status   Enterococcus faecalis NOT DETECTED NOT DETECTED Final   Enterococcus Faecium NOT DETECTED NOT DETECTED Final   Listeria monocytogenes NOT DETECTED NOT DETECTED Final   Staphylococcus species NOT DETECTED NOT DETECTED Final   Staphylococcus aureus (BCID) NOT DETECTED NOT DETECTED Final   Staphylococcus epidermidis NOT DETECTED NOT DETECTED Final   Staphylococcus lugdunensis NOT DETECTED NOT DETECTED Final   Streptococcus species DETECTED (A) NOT DETECTED Final    Comment: CRITICAL RESULT CALLED TO, READ BACK BY AND VERIFIED WITH: PHARMD WILL Dareen Piano [id number] AT 1401 BY CM    Streptococcus agalactiae NOT DETECTED NOT DETECTED Final   Streptococcus pneumoniae NOT DETECTED NOT DETECTED Final   Streptococcus pyogenes DETECTED (A) NOT  DETECTED Final    Comment: CRITICAL RESULT CALLED TO, READ BACK BY AND VERIFIED WITH: PHARMD WILL Dareen Piano [id number] AT 1401 BY CM    A.calcoaceticus-baumannii NOT DETECTED NOT DETECTED Final   Bacteroides fragilis NOT DETECTED NOT DETECTED Final   Enterobacterales NOT DETECTED NOT DETECTED Final   Enterobacter cloacae complex NOT DETECTED NOT DETECTED Final   Escherichia coli NOT DETECTED NOT DETECTED Final   Klebsiella aerogenes NOT DETECTED NOT DETECTED Final   Klebsiella oxytoca NOT DETECTED NOT DETECTED Final   Klebsiella pneumoniae NOT DETECTED NOT DETECTED Final   Proteus species NOT DETECTED NOT DETECTED Final   Salmonella species NOT DETECTED NOT DETECTED Final  Serratia marcescens NOT DETECTED NOT DETECTED Final   Haemophilus influenzae NOT DETECTED NOT DETECTED Final   Neisseria meningitidis NOT DETECTED NOT DETECTED Final   Pseudomonas aeruginosa NOT DETECTED NOT DETECTED Final   Stenotrophomonas maltophilia NOT DETECTED NOT DETECTED Final   Candida albicans NOT DETECTED NOT DETECTED Final   Candida auris NOT DETECTED NOT DETECTED Final   Candida glabrata NOT DETECTED NOT DETECTED Final   Candida krusei NOT DETECTED NOT DETECTED Final   Candida parapsilosis NOT DETECTED NOT DETECTED Final   Candida tropicalis NOT DETECTED NOT DETECTED Final   Cryptococcus neoformans/gattii NOT DETECTED NOT DETECTED Final    Comment: Performed at [facility], [address], [place], [place] [address]  Resp panel by RT-PCR (RSV, Flu A&B, Covid) Anterior Nasal Swab     Status: None   Collection Time: 10/01/22  7:40 PM   Specimen: Anterior Nasal Swab  Result Value Ref Range Status   SARS Coronavirus 2 by RT PCR NEGATIVE NEGATIVE Final    Comment: (NOTE) SARS-CoV-2 target nucleic acids are NOT DETECTED.  The SARS-CoV-2 RNA is generally detectable in upper respiratory specimens during the acute phase of infection. The lowest concentration of SARS-CoV-2 viral copies this assay can  detect is 138 copies/mL. A negative result does not preclude SARS-Cov-2 infection and should not be used as the sole basis for treatment or other patient management decisions. A negative result may occur with  improper specimen collection/handling, submission of specimen other than nasopharyngeal swab, presence of viral mutation(s) within the areas targeted by this assay, and inadequate number of viral copies(<138 copies/mL). A negative result must be combined with clinical observations, patient history, and epidemiological information. The expected result is Negative.  Fact Sheet for Patients:  BloggerCourse.com  Fact Sheet for Healthcare Providers:  SeriousBroker.it  This test is no t yet approved or cleared by the [place] FDA and  has been authorized for detection and/or diagnosis of SARS-CoV-2 by FDA under an Emergency Use Authorization (EUA). This EUA will remain  in effect (meaning this test can be used) for the duration of the COVID-19 declaration under Section 564(b)(1) of the Act, 21 U.S.C.section 360bbb-3(b)(1), unless the authorization is terminated  or revoked sooner.       Influenza A by PCR NEGATIVE NEGATIVE Final   Influenza B by PCR NEGATIVE NEGATIVE Final    Comment: (NOTE) The Xpert Xpress SARS-CoV-2/FLU/RSV plus assay is intended as an aid in the diagnosis of influenza from Nasopharyngeal swab specimens and should not be used as a sole basis for treatment. Nasal washings and aspirates are unacceptable for Xpert Xpress SARS-CoV-2/FLU/RSV testing.  Fact Sheet for Patients: BloggerCourse.com  Fact Sheet for Healthcare Providers: SeriousBroker.it  This test is not yet approved or cleared by the [place] FDA and has been authorized for detection and/or diagnosis of SARS-CoV-2 by FDA under an Emergency Use Authorization (EUA). This EUA will remain in  effect (meaning this test can be used) for the duration of the COVID-19 declaration under Section 564(b)(1) of the Act, 21 U.S.C. section 360bbb-3(b)(1), unless the authorization is terminated or revoked.     Resp Syncytial Virus by PCR NEGATIVE NEGATIVE Final    Comment: (NOTE) Fact Sheet for Patients: BloggerCourse.com  Fact Sheet for Healthcare Providers: SeriousBroker.it  This test is not yet approved or cleared by the [place] FDA and has been authorized for detection and/or diagnosis of SARS-CoV-2 by FDA under an Emergency Use Authorization (EUA). This EUA will remain in effect (meaning  this test can be used) for the duration of the COVID-19 declaration under Section 564(b)(1) of the Act, 21 U.S.C. section 360bbb-3(b)(1), unless the authorization is terminated or revoked.  Performed at [facility], [address], [place], [place] [address]   Culture, blood (Routine X 2) w Reflex to ID Panel     Status: None (Preliminary result)   Collection Time: 10/02/22 10:01 AM   Specimen: BLOOD  Result Value Ref Range Status   Specimen Description BLOOD BLOOD RIGHT WRIST  Final   Special Requests   Final    BOTTLES DRAWN AEROBIC AND ANAEROBIC Blood Culture adequate volume   Culture   Final    NO GROWTH 4 DAYS Performed at [facility], [address], [place], [place] [address]    Report Status PENDING  Incomplete  Culture, blood (Routine X 2) w Reflex to ID Panel     Status: None (Preliminary result)   Collection Time: 10/02/22 10:03 AM   Specimen: BLOOD  Result Value Ref Range Status   Specimen Description BLOOD BLOOD LEFT HAND  Final   Special Requests   Final    BOTTLES DRAWN AEROBIC AND ANAEROBIC Blood Culture adequate volume   Culture   Final    NO GROWTH 4 DAYS Performed at [facility], [address], [place], [place] [address]    Report Status PENDING  Incomplete  Gram stain     Status: None   Collection Time:  10/03/22  2:15 PM   Specimen: Pleura  Result Value Ref Range Status   Specimen Description PLEURAL  Final   Special Requests NONE  Final   Gram Stain   Final    WBC PRESENT, PREDOMINANTLY MONONUCLEAR PLEURAL NO ORGANISMS SEEN CYTOSPIN SMEAR Performed at [facility], [address], [place], [place] [address]    Report Status 10/03/2022 FINAL  Final  Culture, body fluid w Gram Stain-bottle     Status: None (Preliminary result)   Collection Time: 10/03/22  2:15 PM   Specimen: Pleura  Result Value Ref Range Status   Specimen Description PLEURAL  Final   Special Requests 10CC BOTTLES DRAWN AEROBIC AND ANAEROBIC  Final   Culture   Final    NO GROWTH 3 DAYS Performed at [facility], [address], [place], [place] [address]    Report Status PENDING  Incomplete     Serology:   Imaging: If present, new imagings (plain films, ct scans, and mri) have been personally visualized and interpreted; radiology reports have been reviewed. Decision making incorporated into the Impression / Recommendations.   8/8 ct left foot 1. Nonspecific generalized subcutaneous edema throughout the foot and ankle without focal fluid collection, foreign body or soft tissue emphysema. 2. No evidence of osteomyelitis or acute fracture. 3. Prominent erosions along the dorsal aspect of the 2nd tarsometatarsal joint with sclerotic margins, possibly related to underlying gout. Correlate clinically.   8/7 ct chest Cardiomegaly, diffuse coronary artery disease.   Moderate right pleural effusion and small left pleural effusion.   Bilateral lower lobe airspace opacities, mostly dependent. Favor atelectasis although pneumonia cannot be excluded.   Ground-glass airspace disease posteriorly in the right upper lobe more concerning for pneumonia.     Raymondo Band, MD [facility] [phone] pager    10/06/2022, 1:31 PM

## 2022-10-06 NOTE — Inpatient Diabetes Management (Signed)
Inpatient Diabetes Program Recommendations  AACE/ADA: New Consensus Statement on Inpatient Glycemic Control   Target Ranges:  Prepandial:   less than 140 mg/dL      Peak postprandial:   less than 180 mg/dL (1-2 hours)      Critically ill patients:  140 - 180 mg/dL    Latest Reference Range & Units 10/05/22 07:32 10/05/22 11:13 10/05/22 16:59 10/05/22 20:50 10/06/22 01:09 10/06/22 04:57 10/06/22 07:33  Glucose-Capillary 70 - 99 mg/dL 660 (H) 630 (H) 160 (H) 209 (H) 226 (H) 164 (H) 160 (H)   Review of Glycemic Control  Diabetes history: DM2 Outpatient Diabetes medications: None Current orders for Inpatient glycemic control: Novolog 0-9 units Q4H; Solumedrol 20 mg daily  Inpatient Diabetes Program Recommendations:    Insulin: If steroids are continued, please consider ordering Semglee 5 units daily.  Thanks, Orlando Penner, RN, MSN, CDCES Diabetes Coordinator Inpatient Diabetes Program [phone] (Team Pager from 8am to 5pm)

## 2022-10-06 NOTE — Progress Notes (Signed)
Nutrition Follow-up  DOCUMENTATION CODES:   Not applicable  INTERVENTION:   -Liberalize diet to regular for widest variety of meal selections -Feeding assistance with meals -Continue MVI with minerals daily -Continue 30 ml Prosource Plus BID, each supplement provides 100 kcals and 15 grams protein -D/c Glucerna shake -Ensure Enlive po TID, each supplement provides 350 kcal and 20 grams of protein -48 hour calorie count per MD   NUTRITION DIAGNOSIS:   Increased nutrient needs related to wound healing as evidenced by estimated needs.  Ongoing  GOAL:   Patient will meet greater than or equal to 90% of their needs  Unmet  MONITOR:   PO intake, Supplement acceptance  REASON FOR ASSESSMENT:   Malnutrition Screening Tool    ASSESSMENT:   Pt with medical history significant for systolic and diastolic CHF, diabetes mellitus, dementia, hypertension.  Reviewed I/O's: +220 ml x 24 hours and +1 L since admission   Pt remains with poor oral intake. Noted meal completions 0% since 10/02/22. Pt has been taking sips of supplements intermittently.   Per MD notes, pt did not tolerate NGT placement this morning. Attempting to get in touch with family to discuss goals of care. Do not think that pt will be able to consume enough PO to sustain herself. RD would not recommend permanent feeding access (ex PEG tube) secondary to advanced age and dementia. Agree with palliative care consult.  No new wt since admission.  No BM since admission, likely due to poor oral intake.  Medications reviewed and include solu-medrol and protonix.  Labs reviewed: CBGS: 160-180 (inpatient orders for glycemic control are 0-9 units insulin aspart every 4 hours).    Diet Order:   Diet Order             Diet Carb Modified Fluid consistency: Thin; Room service appropriate? Yes  Diet effective now                   EDUCATION NEEDS:   No education needs have been identified at this time  Skin:   Skin Assessment: Skin Integrity Issues: Skin Integrity Issues:: Other (Comment) Other: non-pressure wound to lt foot  Last BM:  Unknown  Height:   Ht Readings from Last 1 Encounters:  09/01/22 5\' 4"  (1.626 m)    Weight:   Wt Readings from Last 1 Encounters:  10/01/22 61.4 kg    Ideal Body Weight:  54.5 kg  BMI:  Body mass index is 23.23 kg/m.  Estimated Nutritional Needs:   Kcal:  1650-1850  Protein:  80-95 grams  Fluid:  > 1.6 L    Levada Schilling, RD, LDN, CDCES Registered Dietitian II Certified Diabetes Care and Education Specialist Please refer to [facility] for RD and/or RD on-call/weekend/after hours pager

## 2022-10-07 DIAGNOSIS — J189 Pneumonia, unspecified organism: Secondary | ICD-10-CM | POA: Diagnosis not present

## 2022-10-07 LAB — GLUCOSE, CAPILLARY
Glucose-Capillary: 151 mg/dL — ABNORMAL HIGH (ref 70–99)
Glucose-Capillary: 177 mg/dL — ABNORMAL HIGH (ref 70–99)
Glucose-Capillary: 190 mg/dL — ABNORMAL HIGH (ref 70–99)
Glucose-Capillary: 355 mg/dL — ABNORMAL HIGH (ref 70–99)
Glucose-Capillary: 376 mg/dL — ABNORMAL HIGH (ref 70–99)
Glucose-Capillary: 378 mg/dL — ABNORMAL HIGH (ref 70–99)
Glucose-Capillary: 383 mg/dL — ABNORMAL HIGH (ref 70–99)

## 2022-10-07 NOTE — Progress Notes (Signed)
Calorie Count Note  48 hour calorie count ordered.  Diet: regular Supplements: Ensure Enlive po TID, each supplement provides 350 kcal and 20 grams of protein; 30 ml Prosource Plus BID  8/12 Breakfast: 0% Lunch: 0% Dinner: 0% Supplements: refused all supplements expect one dose of Prosource Plus (100 kcals, 15 grams protein)  Total intake: 100 kcal (6% of minimum estimated needs)  15 grams protein (18% of minimum estimated needs)  Nutrition Dx: Increased nutrient needs related to wound healing as evidenced by estimated needs; Ongoing   Goal: Patient will meet greater than or equal to 90% of their needs; unmet  Intervention:   -Continue regular diet -Continue feeding assistance with meals -Continue MVI with minerals daily -Continue 30 ml Prosource Plus BID, each supplement provides 100 kcals and 15 grams protein -Continue Ensure Enlive po TID, each supplement provides 350 kcal and 20 grams of protein -Continue 48 hour calorie count   Levada Schilling, RD, LDN, CDCES Registered Dietitian II Certified Diabetes Care and Education Specialist Please refer to [facility] for RD and/or RD on-call/weekend/after hours pager

## 2022-10-07 NOTE — Consult Note (Signed)
[facility] Customer service manager [facility]) Accountable Care Organization (ACO) [facility] Liaison Note  10/07/2022  Marie Hunt [birth year] [id number]  Location: [facility] RN Hospital Liaison screened the patient remotely at [facility].  Insurance: [employer]   [name] is a 89 y.o. female who is a Primary Care Patient of [place], Christella Hartigan. The patient was screened for readmission hospitalization with noted extreme risk score for unplanned readmission risk with 3 IP/1 ED in 6 months.  The patient was assessed for potential [facility]) Care Management service needs for post hospital transition for care coordination. Review of patient's electronic medical record reveals patient ws admitted with Multifocal pneumonia. Pt resides at [facility] and this facility will manage the pt's ongoing needs.   [facility] Care Management/Population Health does not replace or interfere with any arrangements made by the Inpatient Transition of Care team.   For questions contact:   Elliot Cousin, RN, [facility] Liaison [place]   Population Health Office Hours MTWF  8:00 am-6:00 pm Off on Thursday [phone] mobile [phone] [Office toll free line] Office Hours are M-F 8:30 - 5 pm [email or web address]

## 2022-10-07 NOTE — Progress Notes (Signed)
PROGRESS NOTE    Marie Hunt  ZOX:[id number] DOB: 10-12-1932 DOA: 10/01/2022 PCP: Lindaann Pascal   Brief Narrative:    Marie Hunt is a 89 y.o. female with medical history significant for systolic and diastolic CHF, diabetes mellitus, dementia, hypertension. Patient was brought to the ED via EMS for reports of pain to the left foot, patient unable to ambulate.  She has also noticed increased work of breathing from her baseline as well as worsening chronic cough.  She was admitted for multifocal pneumonia with bilateral pleural effusions and started on IV antibiotics.  Patient is not eating and did not tolerate NG tube placement today.  Overall appears to be declining.  Calorie count ordered and SLP evaluation ordered as well.  Assessment & Plan:   Principal Problem:   Multifocal pneumonia Active Problems:   Acute hypoxic respiratory failure (HCC)   Left leg cellulitis   Type 2 diabetes mellitus (HCC)   Chronic combined systolic and diastolic CHF (congestive heart failure) (HCC)   Secondary DM with CKD stage 4 and hypertension (HCC)   Vascular dementia without behavioral disturbance (HCC)   History of gout   Essential hypertension   Acquired hypothyroidism   Bacteremia  Assessment and Plan:   Strep bacteremia in the setting of cellulitis Pneumonia with acute hypoxic respiratory failure.  CTA chest showing multifocal pneumonia, also with moderate right and small left pleural effusion.  Significant leukocytosis of 29.6.  Lactic acid 2.3 > 2. -IV penicillin G and linezolid per ID -Need to confirm indication for Eliquis, no documentation in chart- (Eliquis held for now pending Ct findings) called patient's granddaughter Marie Hunt back to ask, no response. -Thoracentesis performed 8/9 with no findings of infection noted -Change linezolid to IV since not tolerating oral for 4 doses -Transthoracic echocardiogram with no signs of vegetations and now reduced LVEF of 30-35%  compared to 50-55% previously and small pericardial effusion -Repeat TTE shows similar results with LVEF 25-30% and no signs of vegetations.   Left leg cellulitis Increasing pain, tenderness on exam, with swelling and erythema, chronic nonhealing ulcer with minimal drainage. Significant leukocytosis of 29.6.  History of gout.  Rules out for sepsis. - Obtain CT of the left foot considering significant leukocytosis -Continue on penicillin G and linezolid per ID, plan for total 2-week course and can be switched to oral Augmentin on discharge if home hospice not pursued  Acute on chronic anemia-stable No overt bleeding identified Plan to transfuse 1 unit PRBC 8/9  Acute hypoxic respiratory failure (HCC) O2 sats down to 85% on room air, likely due to multifocal pneumonia, and moderate right pleural effusion, small left pleural effusion.   Acute gout-resolved Left foot swelling, likely cellulitis, but also possibly gout.  Not on medication for gout. -No longer on chronic medications -Discontinue further IV Solu-Medrol     Vascular dementia without behavioral disturbance (HCC) Stable.  Nursing home resident.  Baseline most times able to recognize family, answer simple questions, significant short-term memory loss.   Secondary DM with CKD stage 4 and hypertension (HCC) Creatinine starting to elevate, but should improve with PRBC transfusion Monitor strict I's and O's Avoid nephrotoxic agents Started on gentle IV fluids since not tolerating p.o. intake   Chronic combined systolic and diastolic CHF (congestive heart failure) (HCC) No peripheral signs of volume overload.  CT showing pleural effusions.   Type 2 diabetes mellitus (HCC) with steroid-induced hyperglycemia A1c 7.7. - SSI- S every 4 hours with noted hyperglycemia in the setting of  steroid use -Hold linagliptin -Start weaning steroids   Acquired hypothyroidism Resume Synthroid   Essential hypertension Continue to hold home  blood pressure agents for now and monitor   DVT prophylaxis:Heparin Code Status: DNR Family Communication: Granddaughter at bedside 8/13 Disposition Plan: Continue treatment for pneumonia with IV antibiotics Status is: Inpatient Remains inpatient appropriate because: Need for IV medications.   Consultants:  Palliative care ID  Procedures:  NG tube placement attempted 8/10 and patient did not tolerate  Antimicrobials:  Anti-infectives (From admission, onward)    Start     Dose/Rate Route Frequency Ordered Stop   10/04/22 1345  linezolid (ZYVOX) IVPB 600 mg        600 mg 300 mL/hr over 60 Minutes Intravenous Every 12 hours 10/04/22 1254 10/06/22 0727   10/03/22 2200  vancomycin (VANCOREADY) IVPB 750 mg/150 mL  Status:  Discontinued        750 mg 150 mL/hr over 60 Minutes Intravenous Every 48 hours 10/02/22 1041 10/02/22 1507   10/03/22 1245  linezolid (ZYVOX) tablet 600 mg  Status:  Discontinued        600 mg Oral Every 12 hours 10/03/22 1151 10/04/22 1254   10/03/22 1200  Ampicillin-Sulbactam (UNASYN) 3 g in sodium chloride 0.9 % 100 mL IVPB  Status:  Discontinued        3 g 200 mL/hr over 30 Minutes Intravenous Every 12 hours 10/03/22 1058 10/03/22 1151   10/03/22 1200  penicillin G potassium 4 Million Units in dextrose 5 % 250 mL IVPB        4 Million Units 250 mL/hr over 60 Minutes Intravenous Every 8 hours 10/03/22 1151     10/02/22 1800  ceFEPIme (MAXIPIME) 2 g in sodium chloride 0.9 % 100 mL IVPB  Status:  Discontinued        2 g 200 mL/hr over 30 Minutes Intravenous Every 24 hours 10/01/22 1759 10/01/22 2140   10/02/22 1800  cefTRIAXone (ROCEPHIN) 2 g in sodium chloride 0.9 % 100 mL IVPB  Status:  Discontinued        2 g 200 mL/hr over 30 Minutes Intravenous Every 24 hours 10/01/22 2140 10/02/22 1507   10/02/22 1700  Ampicillin-Sulbactam (UNASYN) 3 g in sodium chloride 0.9 % 100 mL IVPB  Status:  Discontinued        3 g 200 mL/hr over 30 Minutes Intravenous Every 24  hours 10/02/22 1510 10/03/22 1058   10/01/22 2230  doxycycline (VIBRAMYCIN) 100 mg in sodium chloride 0.9 % 250 mL IVPB  Status:  Discontinued        100 mg 125 mL/hr over 120 Minutes Intravenous Every 12 hours 10/01/22 2140 10/02/22 1507   10/01/22 1801  vancomycin variable dose per unstable renal function (pharmacist dosing)  Status:  Discontinued         Does not apply See admin instructions 10/01/22 1802 10/02/22 1507   10/01/22 1800  ceFEPIme (MAXIPIME) 2 g in sodium chloride 0.9 % 100 mL IVPB        2 g 200 mL/hr over 30 Minutes Intravenous  Once 10/01/22 1746 10/01/22 2036   10/01/22 1800  metroNIDAZOLE (FLAGYL) IVPB 500 mg        500 mg 100 mL/hr over 60 Minutes Intravenous  Once 10/01/22 1746 10/01/22 2139   10/01/22 1800  vancomycin (VANCOCIN) IVPB 1000 mg/200 mL premix        1,000 mg 200 mL/hr over 60 Minutes Intravenous  Once 10/01/22 1746 10/01/22 2323   10/01/22  1745  vancomycin (VANCOCIN) IVPB 1000 mg/200 mL premix  Status:  Discontinued        1,000 mg 200 mL/hr over 60 Minutes Intravenous  Once 10/01/22 1743 10/01/22 1744   10/01/22 1745  ceFEPIme (MAXIPIME) 2 g in sodium chloride 0.9 % 100 mL IVPB  Status:  Discontinued        2 g 200 mL/hr over 30 Minutes Intravenous  Once 10/01/22 1743 10/01/22 1744       Subjective: Patient seen and evaluated today with improvement in pain complaints noted.  Try to administer diet as tolerated.  Encouraged eating.  Objective: Vitals:   10/06/22 0356 10/06/22 1627 10/06/22 2021 10/07/22 0416  BP: (!) 144/60 (!) 164/74 (!) 144/72 (!) 146/76  Pulse: 69 95 89 96  Resp: 19  20 16   Temp: 97.6 F (36.4 C) 98.2 F (36.8 C) 99.5 F (37.5 C) 99.3 F (37.4 C)  TempSrc: Oral Axillary Oral Oral  SpO2: 98% 100% 98% 97%  Weight:        Intake/Output Summary (Last 24 hours) at 10/07/2022 1130 Last data filed at 10/07/2022 0900 Gross per 24 hour  Intake 240 ml  Output 800 ml  Net -560 ml   Filed Weights   10/01/22 1745 10/01/22  2121  Weight: 55 kg 61.4 kg    Examination:  General exam: Mostly somnolent and intermittently agitated Respiratory system: Clear to auscultation. Respiratory effort normal.  Nasal cannula Cardiovascular system: S1 & S2 heard, RRR.  Gastrointestinal system: Abdomen is soft Central nervous system: Mostly somnolent Extremities: No edema Skin: No significant lesions noted    Data Reviewed: I have personally reviewed following labs and imaging studies  CBC: Recent Labs  Lab 10/01/22 1642 10/02/22 0405 10/03/22 0413 10/03/22 1236 10/04/22 0504 10/05/22 0517 10/06/22 0450 10/07/22 0521  WBC 29.6*   < > 27.1*  --  26.8* 27.8* 21.0* 12.6*  NEUTROABS 27.8*  --   --   --   --   --   --   --   HGB 7.5*   < > 6.4* 8.1* 8.3* 9.3* 9.2* 9.3*  HCT 24.3*   < > 20.9* 25.4* 26.2* 29.3* 28.9* 29.6*  MCV 83.5   < > 81.6  --  84.2 84.0 83.3 83.9  PLT 170   < > 156  --  153 186 206 174   < > = values in this interval not displayed.   Basic Metabolic Panel: Recent Labs  Lab 10/03/22 0413 10/04/22 0504 10/05/22 0517 10/06/22 0450 10/07/22 0521  NA 137 140 135 136 135  K 4.5 4.7 4.8 4.4 4.3  CL 109 111 107 106 107  CO2 18* 19* 18* 19* 18*  GLUCOSE 138* 180* 303* 182* 160*  BUN 49* 54* 59* 62* 57*  CREATININE 2.99* 2.96* 2.92* 2.88* 2.58*  CALCIUM 8.4* 8.7* 8.4* 8.7* 8.5*  MG 2.0 2.1 2.3 2.3 2.2   GFR: Estimated Creatinine Clearance: 13.3 mL/min (A) (by C-G formula based on SCr of 2.58 mg/dL (H)). Liver Function Tests: Recent Labs  Lab 10/01/22 1642  AST 18  ALT 13  ALKPHOS 132*  BILITOT 0.5  PROT 6.4*  ALBUMIN 2.4*   No results for input(s): "LIPASE", "AMYLASE" in the last 168 hours. No results for input(s): "AMMONIA" in the last 168 hours. Coagulation Profile: Recent Labs  Lab 10/01/22 1642  INR 1.7*   Cardiac Enzymes: No results for input(s): "CKTOTAL", "CKMB", "CKMBINDEX", "TROPONINI" in the last 168 hours. BNP (last 3 results) No  results for input(s): "PROBNP"  in the last 8760 hours. HbA1C: No results for input(s): "HGBA1C" in the last 72 hours. CBG: Recent Labs  Lab 10/06/22 1124 10/06/22 2015 10/07/22 0014 10/07/22 0413 10/07/22 0746  GLUCAP 183* 222* 190* 177* 151*   Lipid Profile: No results for input(s): "CHOL", "HDL", "LDLCALC", "TRIG", "CHOLHDL", "LDLDIRECT" in the last 72 hours. Thyroid Function Tests: No results for input(s): "TSH", "T4TOTAL", "FREET4", "T3FREE", "THYROIDAB" in the last 72 hours. Anemia Panel: No results for input(s): "VITAMINB12", "FOLATE", "FERRITIN", "TIBC", "IRON", "RETICCTPCT" in the last 72 hours. Sepsis Labs: Recent Labs  Lab 10/01/22 1642 10/01/22 1837  LATICACIDVEN 2.3* 2.0*    Recent Results (from the past 240 hour(s))  Culture, blood (Routine X 2) w Reflex to ID Panel     Status: None   Collection Time: 10/01/22  4:42 PM   Specimen: Right Antecubital; Blood  Result Value Ref Range Status   Specimen Description RIGHT ANTECUBITAL  Final   Special Requests   Final    BOTTLES DRAWN AEROBIC AND ANAEROBIC Blood Culture adequate volume   Culture   Final    NO GROWTH 5 DAYS Performed at [facility], [address], [place], [place] [address]    Report Status 10/06/2022 FINAL  Final  Culture, blood (Routine X 2) w Reflex to ID Panel     Status: Abnormal (Preliminary result)   Collection Time: 10/01/22  4:58 PM   Specimen: Left Antecubital; Blood  Result Value Ref Range Status   Specimen Description   Final    LEFT ANTECUBITAL Performed at [facility], [address], [place], [place] [address]    Special Requests   Final    BOTTLES DRAWN AEROBIC AND ANAEROBIC Blood Culture adequate volume Performed at [facility], [address], [place], [place] [address]    Culture  Setup Time   Final    GRAM POSITIVE COCCI IN CHAINS IN BOTH AEROBIC AND ANAEROBIC BOTTLES Gram Stain Report Called to,Read Back By and Verified With: CATES LAND, MICHELLE @ 0900 ON 10/02/2022 BY FRATTO,ASHLEY CRITICAL RESULT  CALLED TO, READ BACK BY AND VERIFIED WITH: PHARMD WILL Dareen Piano [id number] AT 1401 BY CM    Culture (A)  Final    STREPTOCOCCUS PYOGENES HEALTH DEPARTMENT NOTIFIED Performed at [facility], [address], [place], [place] [address]    Report Status PENDING  Incomplete   Organism ID, Bacteria STREPTOCOCCUS PYOGENES  Final      Susceptibility   Streptococcus pyogenes - MIC*    PENICILLIN <=0.06 SENSITIVE Sensitive     CEFTRIAXONE <=0.12 SENSITIVE Sensitive     ERYTHROMYCIN 4 RESISTANT Resistant     LEVOFLOXACIN <=0.25 SENSITIVE Sensitive     VANCOMYCIN 0.5 SENSITIVE Sensitive     * STREPTOCOCCUS PYOGENES  Blood Culture ID Panel (Reflexed)     Status: Abnormal   Collection Time: 10/01/22  4:58 PM  Result Value Ref Range Status   Enterococcus faecalis NOT DETECTED NOT DETECTED Final   Enterococcus Faecium NOT DETECTED NOT DETECTED Final   Listeria monocytogenes NOT DETECTED NOT DETECTED Final   Staphylococcus species NOT DETECTED NOT DETECTED Final   Staphylococcus aureus (BCID) NOT DETECTED NOT DETECTED Final   Staphylococcus epidermidis NOT DETECTED NOT DETECTED Final   Staphylococcus lugdunensis NOT DETECTED NOT DETECTED Final   Streptococcus species DETECTED (A) NOT DETECTED Final    Comment: CRITICAL RESULT CALLED TO, READ BACK BY AND VERIFIED WITH: PHARMD WILL ANDERSON [id number] AT 1401 BY CM    Streptococcus  agalactiae NOT DETECTED NOT DETECTED Final   Streptococcus pneumoniae NOT DETECTED NOT DETECTED Final   Streptococcus pyogenes DETECTED (A) NOT DETECTED Final    Comment: CRITICAL RESULT CALLED TO, READ BACK BY AND VERIFIED WITH: PHARMD WILL Dareen Piano [id number] AT 1401 BY CM    A.calcoaceticus-baumannii NOT DETECTED NOT DETECTED Final   Bacteroides fragilis NOT DETECTED NOT DETECTED Final   Enterobacterales NOT DETECTED NOT DETECTED Final   Enterobacter cloacae complex NOT DETECTED NOT DETECTED Final   Escherichia coli NOT DETECTED NOT DETECTED Final   Klebsiella  aerogenes NOT DETECTED NOT DETECTED Final   Klebsiella oxytoca NOT DETECTED NOT DETECTED Final   Klebsiella pneumoniae NOT DETECTED NOT DETECTED Final   Proteus species NOT DETECTED NOT DETECTED Final   Salmonella species NOT DETECTED NOT DETECTED Final   Serratia marcescens NOT DETECTED NOT DETECTED Final   Haemophilus influenzae NOT DETECTED NOT DETECTED Final   Neisseria meningitidis NOT DETECTED NOT DETECTED Final   Pseudomonas aeruginosa NOT DETECTED NOT DETECTED Final   Stenotrophomonas maltophilia NOT DETECTED NOT DETECTED Final   Candida albicans NOT DETECTED NOT DETECTED Final   Candida auris NOT DETECTED NOT DETECTED Final   Candida glabrata NOT DETECTED NOT DETECTED Final   Candida krusei NOT DETECTED NOT DETECTED Final   Candida parapsilosis NOT DETECTED NOT DETECTED Final   Candida tropicalis NOT DETECTED NOT DETECTED Final   Cryptococcus neoformans/gattii NOT DETECTED NOT DETECTED Final    Comment: Performed at [facility], [address] [place], [place] [address]  Resp panel by RT-PCR (RSV, Flu A&B, Covid) Anterior Nasal Swab     Status: None   Collection Time: 10/01/22  7:40 PM   Specimen: Anterior Nasal Swab  Result Value Ref Range Status   SARS Coronavirus 2 by RT PCR NEGATIVE NEGATIVE Final    Comment: (NOTE) SARS-CoV-2 target nucleic acids are NOT DETECTED.  The SARS-CoV-2 RNA is generally detectable in upper respiratory specimens during the acute phase of infection. The lowest concentration of SARS-CoV-2 viral copies this assay can detect is 138 copies/mL. A negative result does not preclude SARS-Cov-2 infection and should not be used as the sole basis for treatment or other patient management decisions. A negative result may occur with  improper specimen collection/handling, submission of specimen other than nasopharyngeal swab, presence of viral mutation(s) within the areas targeted by this assay, and inadequate number of viral copies(<138  copies/mL). A negative result must be combined with clinical observations, patient history, and epidemiological information. The expected result is Negative.  Fact Sheet for Patients:  BloggerCourse.com  Fact Sheet for Healthcare Providers:  SeriousBroker.it  This test is no t yet approved or cleared by the [place] FDA and  has been authorized for detection and/or diagnosis of SARS-CoV-2 by FDA under an Emergency Use Authorization (EUA). This EUA will remain  in effect (meaning this test can be used) for the duration of the COVID-19 declaration under Section 564(b)(1) of the Act, 21 U.S.C.section 360bbb-3(b)(1), unless the authorization is terminated  or revoked sooner.       Influenza A by PCR NEGATIVE NEGATIVE Final   Influenza B by PCR NEGATIVE NEGATIVE Final    Comment: (NOTE) The Xpert Xpress SARS-CoV-2/FLU/RSV plus assay is intended as an aid in the diagnosis of influenza from Nasopharyngeal swab specimens and should not be used as a sole basis for treatment. Nasal washings and aspirates are unacceptable for Xpert Xpress SARS-CoV-2/FLU/RSV testing.  Fact Sheet for Patients: BloggerCourse.com  Fact Sheet for Healthcare  Providers: SeriousBroker.it  This test is not yet approved or cleared by the [place] and has been authorized for detection and/or diagnosis of SARS-CoV-2 by FDA under an Emergency Use Authorization (EUA). This EUA will remain in effect (meaning this test can be used) for the duration of the COVID-19 declaration under Section 564(b)(1) of the Act, 21 U.S.C. section 360bbb-3(b)(1), unless the authorization is terminated or revoked.     Resp Syncytial Virus by PCR NEGATIVE NEGATIVE Final    Comment: (NOTE) Fact Sheet for Patients: BloggerCourse.com  Fact Sheet for Healthcare  Providers: SeriousBroker.it  This test is not yet approved or cleared by the [place] FDA and has been authorized for detection and/or diagnosis of SARS-CoV-2 by FDA under an Emergency Use Authorization (EUA). This EUA will remain in effect (meaning this test can be used) for the duration of the COVID-19 declaration under Section 564(b)(1) of the Act, 21 U.S.C. section 360bbb-3(b)(1), unless the authorization is terminated or revoked.  Performed at [facility], [address] [place], [place] [address]   Culture, blood (Routine X 2) w Reflex to ID Panel     Status: None (Preliminary result)   Collection Time: 10/02/22 10:01 AM   Specimen: BLOOD  Result Value Ref Range Status   Specimen Description BLOOD BLOOD RIGHT WRIST  Final   Special Requests   Final    BOTTLES DRAWN AEROBIC AND ANAEROBIC Blood Culture adequate volume   Culture   Final    NO GROWTH 4 DAYS Performed at [facility], [address] [place], [place] [address]    Report Status PENDING  Incomplete  Culture, blood (Routine X 2) w Reflex to ID Panel     Status: None (Preliminary result)   Collection Time: 10/02/22 10:03 AM   Specimen: BLOOD  Result Value Ref Range Status   Specimen Description BLOOD BLOOD LEFT HAND  Final   Special Requests   Final    BOTTLES DRAWN AEROBIC AND ANAEROBIC Blood Culture adequate volume   Culture   Final    NO GROWTH 4 DAYS Performed at [facility], [address] [place], [place] [address]    Report Status PENDING  Incomplete  Acid Fast Smear (AFB)     Status: None   Collection Time: 10/03/22  2:15 PM   Specimen: Thoracentesis  Result Value Ref Range Status   AFB Specimen Processing Concentration  Final   Acid Fast Smear Negative  Final    Comment: (NOTE) Performed At: [facility] [address] [place], [place] [id number] Jolene Schimke MD FU:[id number]    Source (AFB) PLEURAL  Final    Comment: Performed at [facility], [address], [place], [place] [address]  Gram stain     Status: None   Collection Time: 10/03/22  2:15 PM   Specimen: Pleura  Result Value Ref Range Status   Specimen Description PLEURAL  Final   Special Requests NONE  Final   Gram Stain   Final    WBC PRESENT, PREDOMINANTLY MONONUCLEAR PLEURAL NO ORGANISMS SEEN CYTOSPIN SMEAR Performed at [facility], [address], [place], [place] [address]    Report Status 10/03/2022 FINAL  Final  Culture, body fluid w Gram Stain-bottle     Status: None (Preliminary result)   Collection Time: 10/03/22  2:15 PM   Specimen: Pleura  Result Value Ref Range Status   Specimen Description PLEURAL  Final   Special Requests 10CC BOTTLES DRAWN AEROBIC AND ANAEROBIC  Final   Culture  Final    NO GROWTH 3 DAYS Performed at [facility], [address], [place], [place] [address]    Report Status PENDING  Incomplete         Radiology Studies: ECHOCARDIOGRAM COMPLETE  Result Date: 10/06/2022    ECHOCARDIOGRAM REPORT   Patient Name:   Marie Hunt Date of Exam: 10/05/2022 Medical Rec #:  [id number]          Height:       64.0 in Accession #:    [id number]         Weight:       135.4 lb Date of Birth:  1932-09-18          BSA:          1.657 m Patient Age:    89 years           BP:           159/73 mmHg Patient Gender: F                  HR:           86 bpm. [facility] Procedure: 2D Echo, Cardiac Doppler and Color Doppler Indications:    Bacteremia  History:        Patient has prior history of Echocardiogram examinations, most                 recent 10/04/2022. Abnormal ECG, Signs/Symptoms:Altered Mental                 Status and Murmur; Risk Factors:Dyslipidemia, Diabetes and                 Hypertension. Vascular dimentia. Pneumonia.  Sonographer:    Sheralyn Boatman RDCS Referring Phys: [id number] [facility]  Sonographer Comments: Technically difficult study due to poor echo windows. Unable to turn patient due to vascular dimentia. Every attempt was  made to keep patient from screming out. Left arm against side. Apical window medial. Exam was attempted previous day. IMPRESSIONS  1. Left ventricular ejection fraction, by estimation, is 25 to 30%. The left ventricle has severely decreased function. The left ventricle demonstrates global hypokinesis. There is mild left ventricular hypertrophy. Left ventricular diastolic parameters  are indeterminate.  2. Right ventricular systolic function is mildly reduced. The right ventricular size is mildly enlarged. There is mildly elevated pulmonary artery systolic pressure.  3. Left atrial size was mildly dilated.  4. Large pleural effusion in the left lateral region.  5. The mitral valve is degenerative. Mild to moderate mitral valve regurgitation. Mild mitral stenosis. The mean mitral valve gradient is 3.0 mmHg.HR is 75. Moderate mitral annular calcification.  6. The tricuspid valve is abnormal.  7. The aortic valve is tricuspid. There is severe calcifcation of the aortic valve. There is severe thickening of the aortic valve. Aortic valve regurgitation is mild. Mild aortic valve stenosis.  8. The inferior vena cava is normal in size with <50% respiratory variability, suggesting right atrial pressure of 8 mmHg. FINDINGS  Left Ventricle: Left ventricular ejection fraction, by estimation, is 25 to 30%. The left ventricle has severely decreased function. The left ventricle demonstrates global hypokinesis. The left ventricular internal cavity size was normal in size. There is mild left ventricular hypertrophy. Left ventricular diastolic parameters are indeterminate. Right Ventricle: The right ventricular size is mildly enlarged. Right vetricular wall thickness was not well visualized. Right ventricular systolic function is mildly reduced. There is mildly elevated pulmonary  artery systolic pressure. The tricuspid regurgitant velocity is 2.97 m/s, and with an assumed right atrial pressure of 8 mmHg, the estimated right  ventricular systolic pressure is 43.3 mmHg. Left Atrium: Left atrial size was mildly dilated. Right Atrium: Right atrial size was normal in size. Pericardium: There is no evidence of pericardial effusion. Mitral Valve: The mitral valve is degenerative in appearance. There is moderate thickening of the mitral valve leaflet(s). There is moderate calcification of the mitral valve leaflet(s). Moderate mitral annular calcification. Mild to moderate mitral valve regurgitation. Mild mitral valve stenosis. The mean mitral valve gradient is 3.0 mmHg. Tricuspid Valve: The tricuspid valve is abnormal. Tricuspid valve regurgitation is mild . No evidence of tricuspid stenosis. Aortic Valve: The aortic valve is tricuspid. There is severe calcifcation of the aortic valve. There is severe thickening of the aortic valve. There is severe aortic valve annular calcification. Aortic valve regurgitation is mild. Aortic regurgitation PHT measures 505 msec. Mild aortic stenosis is present. Aortic valve mean gradient measures 13.5 mmHg. Aortic valve peak gradient measures 25.2 mmHg. Aortic valve area, by VTI measures 3.20 cm. Pulmonic Valve: The pulmonic valve was not well visualized. Pulmonic valve regurgitation is mild. No evidence of pulmonic stenosis. Aorta: The aortic root and ascending aorta are structurally normal, with no evidence of dilitation. Venous: The inferior vena cava is normal in size with less than 50% respiratory variability, suggesting right atrial pressure of 8 mmHg. IAS/Shunts: The interatrial septum was not well visualized. Additional Comments: There is a large pleural effusion in the left lateral region.  LEFT VENTRICLE PLAX 2D LVIDd:         4.90 cm LVIDs:         4.40 cm LV PW:         1.20 cm LV IVS:        1.20 cm LVOT diam:     2.50 cm LV SV:         201 LV SV Index:   121 LVOT Area:     4.91 cm  LV Volumes (MOD) LV vol d, MOD A2C: 147.0 ml LV vol d, MOD A4C: 117.0 ml LV vol s, MOD A2C: 94.2 ml LV vol s, MOD  A4C: 89.8 ml LV SV MOD A2C:     52.8 ml LV SV MOD A4C:     117.0 ml LV SV MOD BP:      38.6 ml RIGHT VENTRICLE            IVC RV S prime:     8.92 cm/s  IVC diam: 2.00 cm TAPSE (M-mode): 1.7 cm LEFT ATRIUM             Index        RIGHT ATRIUM           Index LA diam:        3.90 cm 2.35 cm/m   RA Area:     14.20 cm LA Vol (A2C):   65.6 ml 39.58 ml/m  RA Volume:   37.70 ml  22.75 ml/m LA Vol (A4C):   78.3 ml 47.25 ml/m LA Biplane Vol: 69.9 ml 42.18 ml/m  AORTIC VALVE                     PULMONIC VALVE AV Area (Vmax):    3.50 cm      PR End Diast Vel: 2.07 msec AV Area (Vmean):   3.66 cm AV Area (VTI):     3.20 cm AV  Vmax:           251.00 cm/s AV Vmean:          169.000 cm/s AV VTI:            0.628 m AV Peak Grad:      25.2 mmHg AV Mean Grad:      13.5 mmHg LVOT Vmax:         179.00 cm/s LVOT Vmean:        126.000 cm/s LVOT VTI:          0.410 m LVOT/AV VTI ratio: 0.65 AI PHT:            505 msec  AORTA Ao Root diam: 3.00 cm Ao Asc diam:  3.80 cm MITRAL VALVE                TRICUSPID VALVE MV Area (PHT): 4.60 cm     TR Peak grad:   35.3 mmHg MV Mean grad:  3.0 mmHg     TR Vmax:        297.00 cm/s MV Decel Time: 165 msec MV E velocity: 128.00 cm/s  SHUNTS                             Systemic VTI:  0.41 m                             Systemic Diam: 2.50 cm Dina Rich MD Electronically signed by Dina Rich MD Signature Date/Time: 10/06/2022/3:10:57 PM    Final    [place] EKG SITE RITE  Result Date: 10/05/2022 If Site Rite image not attached, placement could not be confirmed due to current cardiac rhythm.       Scheduled Meds:  (feeding supplement) PROSource Plus  30 mL Oral TID BM   feeding supplement  237 mL Oral TID BM   heparin  5,000 Units Subcutaneous Q8H   insulin aspart  0-9 Units Subcutaneous Q4H   methylPREDNISolone (SOLU-MEDROL) injection  10 mg Intravenous Daily   multivitamin with minerals  1 tablet Oral Q lunch   pantoprazole (PROTONIX) IV  40 mg Intravenous QAC breakfast    Continuous Infusions:  pencillin G potassium IV 4 Million Units (10/07/22 1610)     LOS: 6 days    Time spent: 35 minutes     Hoover Brunette, DO [facility]  If 7PM-7AM, please contact night-coverage [email or web address] 10/07/2022, 11:30 AM

## 2022-10-07 NOTE — Progress Notes (Signed)
Noted tte done repeat no veg Repeat bcx negative  If no hospice planned, 2 weeks abx as planned previously

## 2022-10-07 NOTE — Plan of Care (Signed)

## 2022-10-07 NOTE — Evaluation (Signed)
Clinical/Bedside Swallow Evaluation Patient Details  Name: Marie Hunt MRN: [id number] Date of Birth: Mar 13, 1932  Today's Date: 10/07/2022 Time: SLP Start Time (ACUTE ONLY): 1807 SLP Stop Time (ACUTE ONLY): 1837 SLP Time Calculation (min) (ACUTE ONLY): 30 min  Past Medical History:  Past Medical History:  Diagnosis Date   Anemia    Carotid artery disease (HCC)    1-39% RICA, patent LICA 7/12 - Dr. Edilia Bo   Cholelithiasis    Cholelithiasis    In need of cholecystectomy   Diabetes mellitus    Type II   Essential hypertension, benign    GERD (gastroesophageal reflux disease)    GERD (gastroesophageal reflux disease)    Mixed hyperlipidemia    Sigmoid diverticulosis    Sigmoid diverticulosis    Type 2 diabetes mellitus (HCC)    Past Surgical History:  Past Surgical History:  Procedure Laterality Date   CAROTID ENDARTERECTOMY  08/24/2009   Left catroid endarterectomy   CATARACT EXTRACTION W/PHACO Left 08/17/2014   Procedure: CATARACT EXTRACTION PHACO AND INTRAOCULAR LENS PLACEMENT; CDE:  7.39;  Surgeon: Gemma Payor, MD;  Location: [facility];  Service: Ophthalmology;  Laterality: Left;   CATARACT EXTRACTION W/PHACO Right 10/02/2014   Procedure: CATARACT EXTRACTION PHACO AND INTRAOCULAR LENS PLACEMENT RIGHT EYE CDE=5.83;  Surgeon: Gemma Payor, MD;  Location: [facility];  Service: Ophthalmology;  Laterality: Right;   CYSTOSCOPY W/ RETROGRADES Bilateral 03/28/2019   Procedure: CYSTOSCOPY WITH BILATRAL RETROGRADE PYELOGRAM,;  Surgeon: Malen Gauze, MD;  Location: [facility];  Service: Urology;  Laterality: Bilateral;   EYE SURGERY     HIP ARTHROPLASTY Left 10/03/2014   Procedure: ARTHROPLASTY BIPOLAR HIP (HEMIARTHROPLASTY);  Surgeon: Darreld Mclean, MD;  Location: [facility];  Service: Orthopedics;  Laterality: Left;   Left carotid endarterectomy  7/11   Dr. Edilia Bo   TRANSURETHRAL RESECTION OF BLADDER TUMOR N/A 03/28/2019   Procedure: TRANSURETHRAL RESECTION OF BLADDER TUMOR (TURBT);  Surgeon:  Malen Gauze, MD;  Location: [facility];  Service: Urology;  Laterality: N/A;   URETEROSCOPY Left 03/28/2019   Procedure: DIAGNOSTIC URETEROSCOPY WITH LEFT URETERAL STENT PLACEMENT;  Surgeon: Malen Gauze, MD;  Location: [facility];  Service: Urology;  Laterality: Left;   HPI:  Marie Hunt is a 89 y.o. female with medical history significant for systolic and diastolic CHF, diabetes mellitus, dementia, hypertension.  Patient was brought to the ED via EMS for reports of pain to the left foot, patient unable to ambulate.  She has also noticed increased work of breathing from her baseline as well as worsening chronic cough.  She was admitted for multifocal pneumonia with bilateral pleural effusions and started on IV antibiotics.  Patient is not eating and did not tolerate NG tube placement today.  Overall appears to be declining.  Calorie count ordered and SLP evaluation ordered as well.    Assessment / Plan / Recommendation  Clinical Impression  Clinical swallow evaluation completed at bedside. Pt wearing mitts on both hands and upper dentures in place (no lower dentures found in room). Pt denies difficulty swallowing and states, "it just goes right down.". Oral motor examination overall WNL, mild lingual coating noted on lateral aspects of tongue. Pt accepted and swallowed ice chips without incident, in addition to cup and straw sips thin water. Pt with facial grimmace with bite of cold applesauce, but states she loves applesauce. She consumed diced peaches and pears and a single bite of saltine cracker which resulted in prolonged bolus prep with excessive chewing, but slow to  propel posteriorly to swallow despite cues and liquid wash. Pt aware of masticated peaches/cracker and says "not all gone yet... sticking to my dentures". While Pt demonstrated that she can effectively masticate solids, it is not efficient. She only took a very small amount and took Hunt 4 minutes to clear. Recommend  downgrading textures to D2 and continue thin liquids to see if Pt can eat more efficiently. SLP will follow during acute stay and staff will need to help determine if Pt eats more with a modified texture. SLP Visit Diagnosis: Dysphagia, unspecified (R13.10)    Aspiration Risk  No limitations    Diet Recommendation Dysphagia 2 (Fine chop);Thin liquid    Liquid Administration via: Cup;Straw Medication Administration: Whole meds with puree Supervision: Staff to assist with self feeding;Full supervision/cueing for compensatory strategies Compensations: Slow rate;Small sips/bites;Follow solids with liquid Postural Changes: Seated upright at 90 degrees;Remain upright for at least 30 minutes after po intake    Other  Recommendations Oral Care Recommendations: Oral care BID;Staff/trained caregiver to provide oral care    Recommendations for follow up therapy are one component of a multi-disciplinary discharge planning process, led by the attending physician.  Recommendations may be updated based on patient status, additional functional criteria and insurance authorization.  Follow up Recommendations Follow physician's recommendations for discharge plan and follow up therapies      Assistance Recommended at Discharge    Functional Status Assessment Patient has had a recent decline in their functional status and demonstrates the ability to make significant improvements in function in a reasonable and predictable amount of time.  Frequency and Duration min 2x/week  1 week       Prognosis Prognosis for improved oropharyngeal function: Good Barriers to Reach Goals: Cognitive deficits      Swallow Study   General Date of Onset: 10/01/22 HPI: Marie Hunt is a 89 y.o. female with medical history significant for systolic and diastolic CHF, diabetes mellitus, dementia, hypertension.  Patient was brought to the ED via EMS for reports of pain to the left foot, patient unable to ambulate.  She  has also noticed increased work of breathing from her baseline as well as worsening chronic cough.  She was admitted for multifocal pneumonia with bilateral pleural effusions and started on IV antibiotics.  Patient is not eating and did not tolerate NG tube placement today.  Overall appears to be declining.  Calorie count ordered and SLP evaluation ordered as well. Type of Study: Bedside Swallow Evaluation Diet Prior to this Study: Regular;Thin liquids (Level 0) Temperature Spikes Noted: Yes Respiratory Status: Nasal cannula History of Recent Intubation: No Behavior/Cognition: Alert;Cooperative;Pleasant mood Oral Cavity Assessment: Within Functional Limits (min lingual coating) Oral Care Completed by SLP: Yes Oral Cavity - Dentition: Dentures, top;Edentulous Vision: Impaired for self-feeding (Pt currenlty has mitts on) Self-Feeding Abilities: Total assist Patient Positioning: Upright in bed Baseline Vocal Quality: Normal Volitional Cough: Strong;Congested Volitional Swallow: Able to elicit    Oral/Motor/Sensory Function Overall Oral Motor/Sensory Function: Within functional limits   Ice Chips Ice chips: Within functional limits Presentation: Spoon   Thin Liquid Thin Liquid: Within functional limits Presentation: Cup;Straw    Nectar Thick Nectar Thick Liquid: Not tested   Honey Thick Honey Thick Liquid: Not tested   Puree Puree: Within functional limits Presentation: Spoon   Solid     Solid: Impaired Presentation: Spoon Oral Phase Impairments: Impaired mastication Oral Phase Functional Implications: Prolonged oral transit     Thank you,  Havery Moros, CCC-SLP [phone]  , 10/07/2022,7:09 PM

## 2022-10-07 NOTE — Progress Notes (Signed)
Palliative: Chart review completed.  Face-to-face conference with transition of care team.  Report received from staff.  At this point the goal was to complete a calorie count with family assisting in feeding and ongoing speech therapy evaluations.  They are considering returning to [facility] where she has been under long-term care versus hospice care. DNR/goldenrod form completed and placed on chart.  No charge Lillia Carmel, NP Palliative medicine team Team phone [phone] Greater than 50% of this time was spent counseling and coordinating care related to the above assessment and plan.

## 2022-10-08 DIAGNOSIS — J189 Pneumonia, unspecified organism: Secondary | ICD-10-CM | POA: Diagnosis not present

## 2022-10-08 DIAGNOSIS — Z515 Encounter for palliative care: Secondary | ICD-10-CM | POA: Diagnosis not present

## 2022-10-08 DIAGNOSIS — Z7189 Other specified counseling: Secondary | ICD-10-CM | POA: Diagnosis not present

## 2022-10-08 DIAGNOSIS — F0150 Vascular dementia without behavioral disturbance: Secondary | ICD-10-CM | POA: Diagnosis not present

## 2022-10-08 LAB — GLUCOSE, CAPILLARY
Glucose-Capillary: 295 mg/dL — ABNORMAL HIGH (ref 70–99)
Glucose-Capillary: 316 mg/dL — ABNORMAL HIGH (ref 70–99)
Glucose-Capillary: 211 mg/dL — ABNORMAL HIGH (ref 70–99)
Glucose-Capillary: 146 mg/dL — ABNORMAL HIGH (ref 70–99)
Glucose-Capillary: 144 mg/dL — ABNORMAL HIGH (ref 70–99)
Glucose-Capillary: 228 mg/dL — ABNORMAL HIGH (ref 70–99)
Glucose-Capillary: 255 mg/dL — ABNORMAL HIGH (ref 70–99)
Glucose-Capillary: 299 mg/dL — ABNORMAL HIGH (ref 70–99)

## 2022-10-08 MED ORDER — ENSURE PRE-SURGERY PO LIQD
296.0000 mL | Freq: Three times a day (TID) | ORAL | Status: DC
  Administered 2022-10-08 – 2022-10-09 (×2): 296 mL via ORAL
  Filled 2022-10-08 (×6): qty 296

## 2022-10-08 MED ORDER — INSULIN GLARGINE-YFGN 100 UNIT/ML ~~LOC~~ SOLN
5.0000 [IU] | Freq: Once | SUBCUTANEOUS | Status: AC
  Administered 2022-10-08: 5 [IU] via SUBCUTANEOUS
  Filled 2022-10-08: qty 0.05

## 2022-10-08 MED ORDER — PANTOPRAZOLE SODIUM 40 MG PO TBEC
40.0000 mg | DELAYED_RELEASE_TABLET | Freq: Every day | ORAL | Status: DC
  Administered 2022-10-09: 40 mg via ORAL
  Filled 2022-10-08: qty 1

## 2022-10-08 NOTE — Inpatient Diabetes Management (Signed)
Inpatient Diabetes Program Recommendations  AACE/ADA: New Consensus Statement on Inpatient Glycemic Control   Target Ranges:  Prepandial:   less than 140 mg/dL      Peak postprandial:   less than 180 mg/dL (1-2 hours)      Critically ill patients:  140 - 180 mg/dL    Latest Reference Range & Units 10/08/22 00:07 10/08/22 01:01 10/08/22 03:32  Glucose-Capillary 70 - 99 mg/dL 161 (H) 096 (H) 045 (H)    Latest Reference Range & Units 10/07/22 07:46 10/07/22 11:42 10/07/22 15:52 10/07/22 20:16 10/07/22 21:22  Glucose-Capillary 70 - 99 mg/dL 409 (H) 811 (H) 914 (H) 376 (H) 355 (H)   Review of Glycemic Control  Diabetes history: DM2 Outpatient Diabetes medications: None Current orders for Inpatient glycemic control: Novolog 0-9 units Q4H   Inpatient Diabetes Program Recommendations:     Insulin: No longer ordered steroids. May want to consider ordering one time Semglee 5 units x1 (to be given this am).  Thanks, Orlando Penner, RN, MSN, CDCES Diabetes Coordinator Inpatient Diabetes Program [phone] (Team Pager from 8am to 5pm)

## 2022-10-08 NOTE — Progress Notes (Signed)
PROGRESS NOTE    Marie Hunt  ION:[id number] DOB: [birth year] DOA: 10/01/2022 PCP: Lindaann Pascal   Brief Narrative:    Marie Hunt is a 89 y.o. female with medical history significant for systolic and diastolic CHF, diabetes mellitus, dementia, hypertension. Patient was brought to the ED via EMS for reports of pain to the left foot, patient unable to ambulate.  She has also noticed increased work of breathing from her baseline as well as worsening chronic cough.  She was admitted for multifocal pneumonia with bilateral pleural effusions and started on IV antibiotics.  Patient is not eating and did not tolerate NG tube placement today.  Overall appears to be declining.  Calorie count ordered and SLP evaluation revealing need for dysphagia 2 diet.  Assessment & Plan:   Principal Problem:   Multifocal pneumonia Active Problems:   Acute hypoxic respiratory failure (HCC)   Left leg cellulitis   Type 2 diabetes mellitus (HCC)   Chronic combined systolic and diastolic CHF (congestive heart failure) (HCC)   Secondary DM with CKD stage 4 and hypertension (HCC)   Vascular dementia without behavioral disturbance (HCC)   History of gout   Essential hypertension   Acquired hypothyroidism   Bacteremia  Assessment and Plan:   Strep bacteremia in the setting of cellulitis Pneumonia with acute hypoxic respiratory failure.  CTA chest showing multifocal pneumonia, also with moderate right and small left pleural effusion.  Significant leukocytosis of 29.6.  Lactic acid 2.3 > 2. -IV penicillin G and linezolid per ID -Need to confirm indication for Eliquis, no documentation in chart- (Eliquis held for now pending Ct findings) called patient's granddaughter Shirlean Mylar back to ask, no response. -Thoracentesis performed 8/9 with no findings of infection noted -Change linezolid to IV since not tolerating oral for 4 doses -Transthoracic echocardiogram with no signs of vegetations and now reduced  LVEF of 30-35% compared to 50-55% previously and small pericardial effusion -Repeat TTE shows similar results with LVEF 25-30% and no signs of vegetations.   Left leg cellulitis Increasing pain, tenderness on exam, with swelling and erythema, chronic nonhealing ulcer with minimal drainage. Significant leukocytosis of 29.6.  History of gout.  Rules out for sepsis. - Obtain CT of the left foot considering significant leukocytosis -Continue on penicillin G and linezolid per ID, plan for total 2-week course and can be switched to oral Augmentin on discharge if home hospice not pursued  Acute on chronic anemia-stable No overt bleeding identified Plan to transfuse 1 unit PRBC 8/9  Acute hypoxic respiratory failure (HCC) O2 sats down to 85% on room air, likely due to multifocal pneumonia, and moderate right pleural effusion, small left pleural effusion.   Acute gout-resolved Left foot swelling, likely cellulitis, but also possibly gout.  Not on medication for gout. -No longer on chronic medications -Discontinue further IV Solu-Medrol     Vascular dementia without behavioral disturbance (HCC) Stable.  Nursing home resident.  Baseline most times able to recognize family, answer simple questions, significant short-term memory loss.   Secondary DM with CKD stage 4 and hypertension (HCC) Creatinine starting to elevate, but should improve with PRBC transfusion Monitor strict I's and O's Avoid nephrotoxic agents Started on gentle IV fluids since not tolerating p.o. intake   Chronic combined systolic and diastolic CHF (congestive heart failure) (HCC) No peripheral signs of volume overload.  CT showing pleural effusions.   Type 2 diabetes mellitus (HCC) with steroid-induced hyperglycemia A1c 7.7. - SSI- S every 4 hours with noted hyperglycemia in  the setting of steroid use -Hold linagliptin -Start weaning steroids   Acquired hypothyroidism Resume Synthroid   Essential hypertension Continue  to hold home blood pressure agents for now and monitor   DVT prophylaxis:Heparin Code Status: DNR Family Communication: Granddaughter at bedside 8/14 Disposition Plan: Continue treatment for pneumonia with IV antibiotics Status is: Inpatient Remains inpatient appropriate because: Need for IV medications.   Consultants:  Palliative care ID  Procedures:  NG tube placement attempted 8/10 and patient did not tolerate  Antimicrobials:  Anti-infectives (From admission, onward)    Start     Dose/Rate Route Frequency Ordered Stop   10/04/22 1345  linezolid (ZYVOX) IVPB 600 mg        600 mg 300 mL/hr over 60 Minutes Intravenous Every 12 hours 10/04/22 1254 10/06/22 0727   10/03/22 2200  vancomycin (VANCOREADY) IVPB 750 mg/150 mL  Status:  Discontinued        750 mg 150 mL/hr over 60 Minutes Intravenous Every 48 hours 10/02/22 1041 10/02/22 1507   10/03/22 1245  linezolid (ZYVOX) tablet 600 mg  Status:  Discontinued        600 mg Oral Every 12 hours 10/03/22 1151 10/04/22 1254   10/03/22 1200  Ampicillin-Sulbactam (UNASYN) 3 g in sodium chloride 0.9 % 100 mL IVPB  Status:  Discontinued        3 g 200 mL/hr over 30 Minutes Intravenous Every 12 hours 10/03/22 1058 10/03/22 1151   10/03/22 1200  penicillin G potassium 4 Million Units in dextrose 5 % 250 mL IVPB        4 Million Units 250 mL/hr over 60 Minutes Intravenous Every 8 hours 10/03/22 1151 10/15/22 2359   10/02/22 1800  ceFEPIme (MAXIPIME) 2 g in sodium chloride 0.9 % 100 mL IVPB  Status:  Discontinued        2 g 200 mL/hr over 30 Minutes Intravenous Every 24 hours 10/01/22 1759 10/01/22 2140   10/02/22 1800  cefTRIAXone (ROCEPHIN) 2 g in sodium chloride 0.9 % 100 mL IVPB  Status:  Discontinued        2 g 200 mL/hr over 30 Minutes Intravenous Every 24 hours 10/01/22 2140 10/02/22 1507   10/02/22 1700  Ampicillin-Sulbactam (UNASYN) 3 g in sodium chloride 0.9 % 100 mL IVPB  Status:  Discontinued        3 g 200 mL/hr over 30  Minutes Intravenous Every 24 hours 10/02/22 1510 10/03/22 1058   10/01/22 2230  doxycycline (VIBRAMYCIN) 100 mg in sodium chloride 0.9 % 250 mL IVPB  Status:  Discontinued        100 mg 125 mL/hr over 120 Minutes Intravenous Every 12 hours 10/01/22 2140 10/02/22 1507   10/01/22 1801  vancomycin variable dose per unstable renal function (pharmacist dosing)  Status:  Discontinued         Does not apply See admin instructions 10/01/22 1802 10/02/22 1507   10/01/22 1800  ceFEPIme (MAXIPIME) 2 g in sodium chloride 0.9 % 100 mL IVPB        2 g 200 mL/hr over 30 Minutes Intravenous  Once 10/01/22 1746 10/01/22 2036   10/01/22 1800  metroNIDAZOLE (FLAGYL) IVPB 500 mg        500 mg 100 mL/hr over 60 Minutes Intravenous  Once 10/01/22 1746 10/01/22 2139   10/01/22 1800  vancomycin (VANCOCIN) IVPB 1000 mg/200 mL premix        1,000 mg 200 mL/hr over 60 Minutes Intravenous  Once 10/01/22 1746 10/01/22 2323  10/01/22 1745  vancomycin (VANCOCIN) IVPB 1000 mg/200 mL premix  Status:  Discontinued        1,000 mg 200 mL/hr over 60 Minutes Intravenous  Once 10/01/22 1743 10/01/22 1744   10/01/22 1745  ceFEPIme (MAXIPIME) 2 g in sodium chloride 0.9 % 100 mL IVPB  Status:  Discontinued        2 g 200 mL/hr over 30 Minutes Intravenous  Once 10/01/22 1743 10/01/22 1744       Subjective: Patient seen and evaluated today with improvement in pain complaints noted.  Patient still not eating very well and diet being encouraged.  Calorie count in progress and palliative following.  Objective: Vitals:   10/07/22 0416 10/07/22 1557 10/07/22 2018 10/08/22 0330  BP: (!) 146/76 (!) 150/71 (!) 146/87 129/88  Pulse: 96 87 87 91  Resp: 16 20 20 19   Temp: 99.3 F (37.4 C) 99.2 F (37.3 C) 99 F (37.2 C) 98.3 F (36.8 C)  TempSrc: Oral Oral Oral Oral  SpO2: 97% 100% 100% 98%  Weight:        Intake/Output Summary (Last 24 hours) at 10/08/2022 1114 Last data filed at 10/08/2022 0900 Gross per 24 hour  Intake  120 ml  Output 1400 ml  Net -1280 ml   Filed Weights   10/01/22 1745 10/01/22 2121  Weight: 55 kg 61.4 kg    Examination:  General exam: Mostly somnolent and intermittently agitated Respiratory system: Clear to auscultation. Respiratory effort normal.  Nasal cannula Cardiovascular system: S1 & S2 heard, RRR.  Gastrointestinal system: Abdomen is soft Central nervous system: Mostly somnolent Extremities: No edema Skin: No significant lesions noted    Data Reviewed: I have personally reviewed following labs and imaging studies  CBC: Recent Labs  Lab 10/01/22 1642 10/02/22 0405 10/04/22 0504 10/05/22 0517 10/06/22 0450 10/07/22 0521 10/08/22 0402  WBC 29.6*   < > 26.8* 27.8* 21.0* 12.6* 13.1*  NEUTROABS 27.8*  --   --   --   --   --   --   HGB 7.5*   < > 8.3* 9.3* 9.2* 9.3* 9.8*  HCT 24.3*   < > 26.2* 29.3* 28.9* 29.6* 31.6*  MCV 83.5   < > 84.2 84.0 83.3 83.9 83.4  PLT 170   < > 153 186 206 174 180   < > = values in this interval not displayed.   Basic Metabolic Panel: Recent Labs  Lab 10/04/22 0504 10/05/22 0517 10/06/22 0450 10/07/22 0521 10/08/22 0402  NA 140 135 136 135 133*  K 4.7 4.8 4.4 4.3 4.5  CL 111 107 106 107 106  CO2 19* 18* 19* 18* 17*  GLUCOSE 180* 303* 182* 160* 203*  BUN 54* 59* 62* 57* 54*  CREATININE 2.96* 2.92* 2.88* 2.58* 2.06*  CALCIUM 8.7* 8.4* 8.7* 8.5* 8.6*  MG 2.1 2.3 2.3 2.2 2.2   GFR: Estimated Creatinine Clearance: 16.6 mL/min (A) (by C-G formula based on SCr of 2.06 mg/dL (H)). Liver Function Tests: Recent Labs  Lab 10/01/22 1642  AST 18  ALT 13  ALKPHOS 132*  BILITOT 0.5  PROT 6.4*  ALBUMIN 2.4*   No results for input(s): "LIPASE", "AMYLASE" in the last 168 hours. No results for input(s): "AMMONIA" in the last 168 hours. Coagulation Profile: Recent Labs  Lab 10/01/22 1642  INR 1.7*   Cardiac Enzymes: No results for input(s): "CKTOTAL", "CKMB", "CKMBINDEX", "TROPONINI" in the last 168 hours. BNP (last 3  results) No results for input(s): "PROBNP" in the  last 8760 hours. HbA1C: No results for input(s): "HGBA1C" in the last 72 hours. CBG: Recent Labs  Lab 10/07/22 2122 10/08/22 0007 10/08/22 0101 10/08/22 0332 10/08/22 0749  GLUCAP 355* 316* 295* 211* 146*   Lipid Profile: No results for input(s): "CHOL", "HDL", "LDLCALC", "TRIG", "CHOLHDL", "LDLDIRECT" in the last 72 hours. Thyroid Function Tests: No results for input(s): "TSH", "T4TOTAL", "FREET4", "T3FREE", "THYROIDAB" in the last 72 hours. Anemia Panel: No results for input(s): "VITAMINB12", "FOLATE", "FERRITIN", "TIBC", "IRON", "RETICCTPCT" in the last 72 hours. Sepsis Labs: Recent Labs  Lab 10/01/22 1642 10/01/22 1837  LATICACIDVEN 2.3* 2.0*    Recent Results (from the past 240 hour(s))  Culture, blood (Routine X 2) w Reflex to ID Panel     Status: None   Collection Time: 10/01/22  4:42 PM   Specimen: Right Antecubital; Blood  Result Value Ref Range Status   Specimen Description RIGHT ANTECUBITAL  Final   Special Requests   Final    BOTTLES DRAWN AEROBIC AND ANAEROBIC Blood Culture adequate volume   Culture   Final    NO GROWTH 5 DAYS Performed at [facility], [address], [place], [place] [address]    Report Status 10/06/2022 FINAL  Final  Culture, blood (Routine X 2) w Reflex to ID Panel     Status: Abnormal   Collection Time: 10/01/22  4:58 PM   Specimen: Left Antecubital; Blood  Result Value Ref Range Status   Specimen Description   Final    LEFT ANTECUBITAL Performed at [facility], [address], [place], [place] [address]    Special Requests   Final    BOTTLES DRAWN AEROBIC AND ANAEROBIC Blood Culture adequate volume Performed at [facility], [address], [place], [place] [address]    Culture  Setup Time   Final    GRAM POSITIVE COCCI IN CHAINS IN BOTH AEROBIC AND ANAEROBIC BOTTLES Gram Stain Report Called to,Read Back By and Verified With: CATES LAND, MICHELLE @ 0900 ON 10/02/2022 BY  FRATTO,ASHLEY CRITICAL RESULT CALLED TO, READ BACK BY AND VERIFIED WITH: PHARMD WILL Dareen Piano [id number] AT 1401 BY CM    Culture (A)  Final    GROUP A STREP (S.PYOGENES) ISOLATED HEALTH DEPARTMENT NOTIFIED Performed at [facility], [address] [place], [place] [address]    Report Status 10/07/2022 FINAL  Final   Organism ID, Bacteria GROUP A STREP (S.PYOGENES) ISOLATED  Final      Susceptibility   Group a strep (s.pyogenes) isolated - MIC*    PENICILLIN <=0.06 SENSITIVE Sensitive     CEFTRIAXONE <=0.12 SENSITIVE Sensitive     ERYTHROMYCIN 4 RESISTANT Resistant     LEVOFLOXACIN <=0.25 SENSITIVE Sensitive     VANCOMYCIN 0.5 SENSITIVE Sensitive     * GROUP A STREP (S.PYOGENES) ISOLATED  Blood Culture ID Panel (Reflexed)     Status: Abnormal   Collection Time: 10/01/22  4:58 PM  Result Value Ref Range Status   Enterococcus faecalis NOT DETECTED NOT DETECTED Final   Enterococcus Faecium NOT DETECTED NOT DETECTED Final   Listeria monocytogenes NOT DETECTED NOT DETECTED Final   Staphylococcus species NOT DETECTED NOT DETECTED Final   Staphylococcus aureus (BCID) NOT DETECTED NOT DETECTED Final   Staphylococcus epidermidis NOT DETECTED NOT DETECTED Final   Staphylococcus lugdunensis NOT DETECTED NOT DETECTED Final   Streptococcus species DETECTED (A) NOT DETECTED Final    Comment: CRITICAL RESULT CALLED TO, READ BACK BY AND VERIFIED WITH: PHARMD WILL ANDERSON [id number] AT 1401 BY CM  Streptococcus agalactiae NOT DETECTED NOT DETECTED Final   Streptococcus pneumoniae NOT DETECTED NOT DETECTED Final   Streptococcus pyogenes DETECTED (A) NOT DETECTED Final    Comment: CRITICAL RESULT CALLED TO, READ BACK BY AND VERIFIED WITH: PHARMD WILL Dareen Piano [id number] AT 1401 BY CM    A.calcoaceticus-baumannii NOT DETECTED NOT DETECTED Final   Bacteroides fragilis NOT DETECTED NOT DETECTED Final   Enterobacterales NOT DETECTED NOT DETECTED Final   Enterobacter cloacae complex NOT DETECTED NOT  DETECTED Final   Escherichia coli NOT DETECTED NOT DETECTED Final   Klebsiella aerogenes NOT DETECTED NOT DETECTED Final   Klebsiella oxytoca NOT DETECTED NOT DETECTED Final   Klebsiella pneumoniae NOT DETECTED NOT DETECTED Final   Proteus species NOT DETECTED NOT DETECTED Final   Salmonella species NOT DETECTED NOT DETECTED Final   Serratia marcescens NOT DETECTED NOT DETECTED Final   Haemophilus influenzae NOT DETECTED NOT DETECTED Final   Neisseria meningitidis NOT DETECTED NOT DETECTED Final   Pseudomonas aeruginosa NOT DETECTED NOT DETECTED Final   Stenotrophomonas maltophilia NOT DETECTED NOT DETECTED Final   Candida albicans NOT DETECTED NOT DETECTED Final   Candida auris NOT DETECTED NOT DETECTED Final   Candida glabrata NOT DETECTED NOT DETECTED Final   Candida krusei NOT DETECTED NOT DETECTED Final   Candida parapsilosis NOT DETECTED NOT DETECTED Final   Candida tropicalis NOT DETECTED NOT DETECTED Final   Cryptococcus neoformans/gattii NOT DETECTED NOT DETECTED Final    Comment: Performed at [facility], [address], [place], [place] [address]  Resp panel by RT-PCR (RSV, Flu A&B, Covid) Anterior Nasal Swab     Status: None   Collection Time: 10/01/22  7:40 PM   Specimen: Anterior Nasal Swab  Result Value Ref Range Status   SARS Coronavirus 2 by RT PCR NEGATIVE NEGATIVE Final    Comment: (NOTE) SARS-CoV-2 target nucleic acids are NOT DETECTED.  The SARS-CoV-2 RNA is generally detectable in upper respiratory specimens during the acute phase of infection. The lowest concentration of SARS-CoV-2 viral copies this assay can detect is 138 copies/mL. A negative result does not preclude SARS-Cov-2 infection and should not be used as the sole basis for treatment or other patient management decisions. A negative result may occur with  improper specimen collection/handling, submission of specimen other than nasopharyngeal swab, presence of viral mutation(s) within  the areas targeted by this assay, and inadequate number of viral copies(<138 copies/mL). A negative result must be combined with clinical observations, patient history, and epidemiological information. The expected result is Negative.  Fact Sheet for Patients:  BloggerCourse.com  Fact Sheet for Healthcare Providers:  SeriousBroker.it  This test is no t yet approved or cleared by the [place] FDA and  has been authorized for detection and/or diagnosis of SARS-CoV-2 by FDA under an Emergency Use Authorization (EUA). This EUA will remain  in effect (meaning this test can be used) for the duration of the COVID-19 declaration under Section 564(b)(1) of the Act, 21 U.S.C.section 360bbb-3(b)(1), unless the authorization is terminated  or revoked sooner.       Influenza A by PCR NEGATIVE NEGATIVE Final   Influenza B by PCR NEGATIVE NEGATIVE Final    Comment: (NOTE) The Xpert Xpress SARS-CoV-2/FLU/RSV plus assay is intended as an aid in the diagnosis of influenza from Nasopharyngeal swab specimens and should not be used as a sole basis for treatment. Nasal washings and aspirates are unacceptable for Xpert Xpress SARS-CoV-2/FLU/RSV testing.  Fact Sheet for Patients: BloggerCourse.com  Fact Sheet for  Healthcare Providers: SeriousBroker.it  This test is not yet approved or cleared by the [place] and has been authorized for detection and/or diagnosis of SARS-CoV-2 by FDA under an Emergency Use Authorization (EUA). This EUA will remain in effect (meaning this test can be used) for the duration of the COVID-19 declaration under Section 564(b)(1) of the Act, 21 U.S.C. section 360bbb-3(b)(1), unless the authorization is terminated or revoked.     Resp Syncytial Virus by PCR NEGATIVE NEGATIVE Final    Comment: (NOTE) Fact Sheet for  Patients: BloggerCourse.com  Fact Sheet for Healthcare Providers: SeriousBroker.it  This test is not yet approved or cleared by the [place] FDA and has been authorized for detection and/or diagnosis of SARS-CoV-2 by FDA under an Emergency Use Authorization (EUA). This EUA will remain in effect (meaning this test can be used) for the duration of the COVID-19 declaration under Section 564(b)(1) of the Act, 21 U.S.C. section 360bbb-3(b)(1), unless the authorization is terminated or revoked.  Performed at [facility], [address], [place], [place] [address]   Culture, blood (Routine X 2) w Reflex to ID Panel     Status: None   Collection Time: 10/02/22 10:01 AM   Specimen: BLOOD  Result Value Ref Range Status   Specimen Description BLOOD BLOOD RIGHT WRIST  Final   Special Requests   Final    BOTTLES DRAWN AEROBIC AND ANAEROBIC Blood Culture adequate volume   Culture   Final    NO GROWTH 6 DAYS Performed at [facility], [address] [place], [place] [address]    Report Status 10/08/2022 FINAL  Final  Culture, blood (Routine X 2) w Reflex to ID Panel     Status: None   Collection Time: 10/02/22 10:03 AM   Specimen: BLOOD  Result Value Ref Range Status   Specimen Description BLOOD BLOOD LEFT HAND  Final   Special Requests   Final    BOTTLES DRAWN AEROBIC AND ANAEROBIC Blood Culture adequate volume   Culture   Final    NO GROWTH 6 DAYS Performed at [facility], [address], [place], [place] [address]    Report Status 10/08/2022 FINAL  Final  Acid Fast Smear (AFB)     Status: None   Collection Time: 10/03/22  2:15 PM   Specimen: Thoracentesis  Result Value Ref Range Status   AFB Specimen Processing Concentration  Final   Acid Fast Smear Negative  Final    Comment: (NOTE) Performed At: [facility] [address] [place], [place] [id number] Jolene Schimke MD OZ:[id number]    Source (AFB) PLEURAL  Final     Comment: Performed at [facility], [address] [place], [place] [address]  Gram stain     Status: None   Collection Time: 10/03/22  2:15 PM   Specimen: Pleura  Result Value Ref Range Status   Specimen Description PLEURAL  Final   Special Requests NONE  Final   Gram Stain   Final    WBC PRESENT, PREDOMINANTLY MONONUCLEAR PLEURAL NO ORGANISMS SEEN CYTOSPIN SMEAR Performed at [facility], [address], [place], [place] [address]    Report Status 10/03/2022 FINAL  Final  Culture, body fluid w Gram Stain-bottle     Status: None   Collection Time: 10/03/22  2:15 PM   Specimen: Pleura  Result Value Ref Range Status   Specimen Description PLEURAL  Final   Special Requests 10CC BOTTLES DRAWN AEROBIC AND ANAEROBIC  Final   Culture   Final  NO GROWTH 5 DAYS Performed at [facility], [address] [place], [place] [address]    Report Status 10/08/2022 FINAL  Final         Radiology Studies: No results found.      Scheduled Meds:  (feeding supplement) PROSource Plus  30 mL Oral TID BM   feeding supplement  237 mL Oral TID BM   heparin  5,000 Units Subcutaneous Q8H   insulin aspart  0-9 Units Subcutaneous Q4H   multivitamin with minerals  1 tablet Oral Q lunch   pantoprazole (PROTONIX) IV  40 mg Intravenous QAC breakfast   Continuous Infusions:  pencillin G potassium IV 4 Million Units (10/08/22 1191)     LOS: 7 days    Time spent: 35 minutes     Hoover Brunette, DO [facility]  If 7PM-7AM, please contact night-coverage [email or web address] 10/08/2022, 11:14 AM

## 2022-10-08 NOTE — Progress Notes (Signed)
Nutrition Follow-up  DOCUMENTATION CODES:   Severe malnutrition in context of chronic illness  INTERVENTION:   -Continue dysphagia 2 diet  -Continue feeding assistance with meals -Continue MVI with minerals daily -Continue 30 ml Prosource Plus BID, each supplement provides 100 kcals and 15 grams protein -Continue Ensure Enlive po TID, each supplement provides 350 kcal and 20 grams of protein -D/c calorie count  NUTRITION DIAGNOSIS:   Severe Malnutrition related to chronic illness (dementia) as evidenced by moderate muscle depletion, severe muscle depletion, moderate fat depletion, severe fat depletion.  Ongoing  GOAL:   Patient will meet greater than or equal to 90% of their needs  Unmet  MONITOR:   PO intake, Supplement acceptance  REASON FOR ASSESSMENT:   Malnutrition Screening Tool    ASSESSMENT:   Pt with medical history significant for systolic and diastolic CHF, diabetes mellitus, dementia, hypertension.  8/13- s/p BSE- downgraded to dysphagia 2 diet with thin liquids   Reviewed I/O's: -1.2 L x 24 hours and -949 ml since admission  UOP: 1.4 L x 24 hours   Spoke with pt and granddaughters at bedside. They report that pt is a little more alert today and PO intake has improved. Per granddaughter, pt "stopped eating" on 10/01/22 and took in minimal foods and liquids until 10/05/22. For the past 3 days, pt has been consuming yogurt, applesauce, and has added Prosource and Ensure drinks over the past 24-48 hours. No calorie count data available to assess, however, given reported oral intake, this is not sufficient to meet needs.   Granddaughter reports that she is aware that pt is not consuming enough to meet her needs. Pt will typically only eat if her POA is feeding her. RD discussed barriers to adequate PO intake in the hospital, as pts with dementia typically do not eat as well in the hospital due to unfamiliar environment and familiar caregivers. Reviewed options on  dysphagia 2 diet; offered to downgrade diet further but family desires to allow pt to have as many food options are possible. Pt was consuming regular consistency diet at SNF P[facility] (and eating well- consuming most meals per family report), but has been having difficulty with regular consistency diet in the hospital due to energy conversation. Pt is drinking more than she is eating solid foods- she likes the Ensure mixed with Prosource. RD reviewed nutrition care plan with family and explained that pt is receiving all options available to help maximize oral intake. RD obtained food preferences, as family reports house trays have been repetitive.    RD broached possibility of NGT placement. Granddaughter reports that was tried earlier this admission and pt could not tolerate it as it was too painful for her. Reviewed that placement of NGT is a temporary means of nutrition support and pt would be unable to discharge to next venue of care with NGT. RD allowed family to process this information. Family shared that they are considering hospice as the next steps. RD affirmed family for weighing options out and encouraged them to continue to talk amongst themselves regarding what would be the most compassionate decision for the pt. RD provided emotional support. RD would not recommend long term feeding access (such as PEG) secondary to advanced age and dementia; artifical means of nutrition and hydration would not enhance pt's quality of life.   Case discussed with both MD and palliative care team. Family learning towards hospice.   Labs reviewed: Na: 133, CBGS: 144-376 (inpatient orders for glycemic control are ).  NUTRITION - FOCUSED PHYSICAL EXAM:  Flowsheet Row Most Recent Value  Orbital Region Severe depletion  Upper Arm Region Moderate depletion  Thoracic and Lumbar Region Moderate depletion  Buccal Region Severe depletion  Temple Region Severe depletion  Clavicle Bone Region Severe depletion   Clavicle and Acromion Bone Region Severe depletion  Scapular Bone Region Severe depletion  Dorsal Hand Moderate depletion  Patellar Region Mild depletion  Anterior Thigh Region Mild depletion  Posterior Calf Region Mild depletion  Edema (RD Assessment) Mild  Hair Reviewed  Eyes Reviewed  Mouth Reviewed  Skin Reviewed  Nails Reviewed       Diet Order:   Diet Order             DIET DYS 2 Fluid consistency: Thin  Diet effective now                   EDUCATION NEEDS:   Education needs have been addressed  Skin:  Skin Assessment: Skin Integrity Issues: Skin Integrity Issues:: Other (Comment) Other: non-pressure wound to lt foot  Last BM:  Unknown  Height:   Ht Readings from Last 1 Encounters:  09/01/22 5\' 4"  (1.626 m)    Weight:   Wt Readings from Last 1 Encounters:  10/01/22 61.4 kg    Ideal Body Weight:  54.5 kg  BMI:  Body mass index is 23.23 kg/m.  Estimated Nutritional Needs:   Kcal:  1650-1850  Protein:  80-95 grams  Fluid:  > 1.6 L    Levada Schilling, RD, LDN, CDCES Registered Dietitian II Certified Diabetes Care and Education Specialist Please refer to [facility] for RD and/or RD on-call/weekend/after hours pager

## 2022-10-08 NOTE — Progress Notes (Signed)
Palliative:  Marie Hunt is lying quietly in bed.  She appears chronically ill and quite frail.  She greets me, making and somewhat keeping eye contact.  She has known dementia therefore I do not ask orientation questions.  She is surrounded by her loving family including her granddaughter, her son Marie Hunt and his wife. Family shares that Marie Hunt has been at [facility] under Long term care for about 6 weeks.   We talked about Marie Hunt's acute health concerns, her failure to thrive and poor by mouth intake.  Family agrees that she did not tolerate temporary feeding tube placement.  We share that PEG tube placement does not change her chronic health concerns.  Family states that they are considering hospice care.  Call to granddaughter/HCPOA, Marie Hunt.  We talk about RD consult and recommendations.  We review registered dietitian note in detail.  [name] does share that she spoke with RD earlier today.  She shares that she believes that Marie Hunt has not been getting her PROsource.  "I cannot make this decision about hospice until I have all the information".  We talk about what we can and cannot change for Marie Hunt.  I share that PMT will continue to follow.  Conference with attending, bedside nursing staff, registered dietitian, transition of care team related to patient condition, needs, goals of care, disposition.  Plan: At this point, continue to treat the treatable, but no CPR or intubation.  Time for outcomes.  50 minutes  Lillia Carmel, NP Palliative Medicine Team  Team phone [phone] Greater than 50% of this time was spent counseling and coordinating care related to the above assessment and plan.

## 2022-10-08 NOTE — Progress Notes (Signed)
Brief Nutrition Follow-Up Note  Case discussed with palliative care; pt granddaughter had additional concerns about pt's nutrition needs and asked that RD follow-up with her.   RD called pt's granddaughter via phone. She is concerned that pt is not receiving supplements as ordered. Reviewed supplement orders; family is under the impression that she has only received one Prosource and one Ensure daily (although 2 have been documented as given). Pt also is drinking Vital Cuisine drinks as well as Magic Cpps. Pt family has also been bringing in ice cream for pt. Pt prefers Ensure Pre Surgery drink and has requested that this be given.  Family voiced concern that calorie count may be best reflection of pt's true oral intake as diet was downgraded just yesterday as pt was struggling to consume regular consistency diet. Reviewed plan of care with family and made applicable changes.   Case discussed with RN, MD, and Palliative care.   Per MD notes, pt may discharge back to SNF tomorrow with palliative care follow-up.   Interventions:   -Continue feeding assistance with meals -Continue MVI with minerals daily -Continue 30 ml Prosource Plus TID, each supplement provides 100 kcals and 15 grams protein -Continue Ensure Pre-Surgery po TID, each supplement provides 200 kcal and 0 grams of protein -Continue Magic cup TID with meals, each supplement provides 290 kcal and 9 grams of protein  -Vital Cuisine TID, each supplement provides 520 kcals and 22 grams protein  Levada Schilling, RD, LDN, CDCES Registered Dietitian II Certified Diabetes Care and Education Specialist Please refer to [facility] for RD and/or RD on-call/weekend/after hours pager

## 2022-10-09 DIAGNOSIS — F0150 Vascular dementia without behavioral disturbance: Secondary | ICD-10-CM | POA: Diagnosis not present

## 2022-10-09 DIAGNOSIS — Z515 Encounter for palliative care: Secondary | ICD-10-CM | POA: Diagnosis not present

## 2022-10-09 DIAGNOSIS — Z7189 Other specified counseling: Secondary | ICD-10-CM | POA: Diagnosis not present

## 2022-10-09 DIAGNOSIS — J189 Pneumonia, unspecified organism: Secondary | ICD-10-CM | POA: Diagnosis not present

## 2022-10-09 LAB — CBC
HCT: 35.5 % — ABNORMAL LOW (ref 36.0–46.0)
Hemoglobin: 11.2 g/dL — ABNORMAL LOW (ref 12.0–15.0)
MCH: 26.3 pg (ref 26.0–34.0)
MCHC: 31.5 g/dL (ref 30.0–36.0)
MCV: 83.3 fL (ref 80.0–100.0)
Platelets: 189 10*3/uL (ref 150–400)
RBC: 4.26 MIL/uL (ref 3.87–5.11)
RDW: 19.6 % — ABNORMAL HIGH (ref 11.5–15.5)
WBC: 13.1 10*3/uL — ABNORMAL HIGH (ref 4.0–10.5)
nRBC: 0 % (ref 0.0–0.2)

## 2022-10-09 LAB — BASIC METABOLIC PANEL
Anion gap: 13 (ref 5–15)
BUN: 53 mg/dL — ABNORMAL HIGH (ref 8–23)
CO2: 16 mmol/L — ABNORMAL LOW (ref 22–32)
Calcium: 9.1 mg/dL (ref 8.9–10.3)
Chloride: 107 mmol/L (ref 98–111)
Creatinine, Ser: 1.87 mg/dL — ABNORMAL HIGH (ref 0.44–1.00)
GFR, Estimated: 26 mL/min — ABNORMAL LOW (ref >60)
Glucose, Bld: 144 mg/dL — ABNORMAL HIGH (ref 70–99)
Potassium: 5 mmol/L (ref 3.5–5.1)
Sodium: 136 mmol/L (ref 135–145)

## 2022-10-09 LAB — MAGNESIUM: Magnesium: 2.2 mg/dL (ref 1.7–2.4)

## 2022-10-09 LAB — GLUCOSE, CAPILLARY
Glucose-Capillary: 153 mg/dL — ABNORMAL HIGH (ref 70–99)
Glucose-Capillary: 156 mg/dL — ABNORMAL HIGH (ref 70–99)
Glucose-Capillary: 168 mg/dL — ABNORMAL HIGH (ref 70–99)
Glucose-Capillary: 212 mg/dL — ABNORMAL HIGH (ref 70–99)
Glucose-Capillary: 232 mg/dL — ABNORMAL HIGH (ref 70–99)

## 2022-10-09 MED ORDER — PROSOURCE PLUS PO LIQD: 30.0000 mL | Freq: Three times a day (TID) | ORAL | 0 refills | Status: AC

## 2022-10-09 MED ORDER — AMOXICILLIN-POT CLAVULANATE 250-62.5 MG/5ML PO SUSR: 250.0000 mg | Freq: Two times a day (BID) | ORAL | 0 refills | Status: AC

## 2022-10-09 MED ORDER — ENSURE PRE-SURGERY PO LIQD: 296.0000 mL | Freq: Three times a day (TID) | ORAL | 0 refills | Status: AC

## 2022-10-09 NOTE — Progress Notes (Signed)
[facility] Liaison Note  Received request for outpatient palliative care services at discharge.  Will follow for discharge disposition. Noted possible discharge today.  Please call with any questions.  Thank you for the opportunity to participate in this patients care.  Thank you, Haynes Bast, BSN, [facility] [phone]

## 2022-10-09 NOTE — TOC Transition Note (Signed)
Transition of Care [facility]) - CM/SW Discharge Note   Patient Details  Name: Marie Hunt MRN: [id number] Date of Birth: 1933-02-19  Transition of [facility]) CM/SW Contact:  Elliot Gault, LCSW Phone Number: 10/09/2022, 2:11 PM   Clinical Narrative:     Pt medically stable for dc today per MD. Updated [name] at [facility] and they can accept pt today.  Spoke with pt's granddaughter to update on dc planning. Questions and concerns addressed. Gdtr agreeable to dc back to [facility] today.  DC clinical sent electronically. RN to call report. EMS arranged.  No other TOC needs for dc.  Final next level of care: Long Term Nursing Home Barriers to Discharge: Barriers Resolved   Patient Goals and CMS Choice   Choice offered to / list presented to :  (granddaughter)  Discharge Placement                         Discharge Plan and Services Additional resources added to the After Visit Summary for   In-house Referral: Clinical Social Work   Post Acute Care Choice: Skilled Nursing Facility                               Social Determinants of Health (SDOH) Interventions SDOH Screenings   Food Insecurity: No Food Insecurity (08/12/2022)  Housing: Patient Declined (08/12/2022)  Transportation Needs: No Transportation Needs (08/12/2022)  Utilities: Not At Risk (08/12/2022)  Tobacco Use: Medium Risk (10/01/2022)     Readmission Risk Interventions    10/02/2022    9:20 AM  Readmission Risk Prevention Plan  Transportation Screening Complete  Medication Review (RN Care Manager) Complete  HRI or Home Care Consult Complete  SW Recovery Care/Counseling Consult Complete  Palliative Care Screening Not Applicable  Skilled Nursing Facility Complete

## 2022-10-09 NOTE — Plan of Care (Signed)
  Problem: Acute Rehab PT Goals(only PT should resolve) Goal: Pt Will Go Supine/Side To Sit Outcome: Progressing Flowsheets (Taken 10/09/2022 1248) Pt will go Supine/Side to Sit: with moderate assist Goal: Patient Will Transfer Sit To/From Stand Outcome: Progressing Flowsheets (Taken 10/09/2022 1248) Patient will transfer sit to/from stand: with moderate assist Goal: Pt Will Transfer Bed To Chair/Chair To Bed Outcome: Progressing Flowsheets (Taken 10/09/2022 1248) Pt will Transfer Bed to Chair/Chair to Bed: with mod assist Goal: Pt Will Ambulate Outcome: Progressing Flowsheets (Taken 10/09/2022 1248) Pt will Ambulate:  10 feet  with moderate assist  with maximum assist     SPT [employer], DPT Program

## 2022-10-09 NOTE — Discharge Summary (Addendum)
Physician Discharge Summary  Marie Hunt:[id number] DOB: 25-Aug-1932 DOA: 10/01/2022  PCP: Lindaann Pascal  Admit date: 10/01/2022  Discharge date: 10/09/2022  Admitted From:SNF  Disposition:  SNF  Recommendations for Outpatient Follow-up:  Follow up with PCP in 1-2 weeks Please obtain BMP/CBC in one week Continue on Augmentin as ordered for 6 more days to complete total 2-week course of treatment for strep bacteremia in the setting of cellulitis Consider initiation of appetite stimulants should patient not have improvement in appetite/oral intake Continue other medications as noted Follow-up with outpatient palliative care  Home Health: None  Equipment/Devices: None  Discharge Condition:Stable  CODE STATUS: DNR  Diet recommendation: Dysphagia 2 diet, pt cannot self feed needs assistance.  Brief/Interim Summary: Marie Hunt is a 89 y.o. female with medical history significant for systolic and diastolic CHF, diabetes mellitus, dementia, hypertension. Patient was brought to the ED via EMS for reports of pain to the left foot, patient unable to ambulate.  She has also noticed increased work of breathing from her baseline as well as worsening chronic cough.  She was admitted for treatment of left leg cellulitis with associated wound and was noted to have strep bacteremia.  She was seen by ID and her IV antibiotics were adjusted to IV penicillin G and linezolid.  She underwent transthoracic echocardiogram studies with no signs of endocarditis noted and therefore it was decided that the best course of action would be to keep her on oral Augmentin for the remainder of her treatment.  Her shortness of breath appeared to be related to volume overload and has now improved after thoracentesis.  She was also noted to have an episode of acute gout that has now resolved with the use of IV steroids.  She continued to have decline in her clinical condition with poor appetite and generalized  weakness noted.  A 48-hour calorie count was attempted, but was not accurate and she was eventually seen by SLP with recommendations for dysphagia 2 diet.  She is being followed by palliative care and she appears to be hospice appropriate if she does not have any improvement in her oral intake over the next few days to weeks.  At this point, her infection has improved and she may continue the rest of her course orally at her facility where she will be closely monitored by outpatient palliative care.  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  Acute hypoxemic respiratory failure secondary to volume overload.  Vascular dementia with failure to thrive.  Discharge Instructions  Discharge Instructions     Amb Referral to Palliative Care   Complete by: As directed    Diet - low sodium heart healthy   Complete by: As directed    Discharge wound care:   Complete by: As directed    Daily      Comments: Wound care to left lateral foot full thickness wound: Cleanse with soap and water, rinse and dry. Cover with size appropriate pice of silver hydrofiber (Aquacel Ag+, [place] #  [id number]) and top with dry gauze. Secure with silicone foam and change daily. Place foot into Prevalon boot.   Increase activity slowly   Complete by: As directed       Allergies as of 10/09/2022       Reactions   Codeine Nausea And Vomiting        Medication List     STOP taking these medications    Eliquis 2.5 MG  Tabs tablet Generic drug: apixaban   furosemide 40 MG tablet Commonly known as: LASIX   hydrALAZINE 25 MG tablet Commonly known as: APRESOLINE   isosorbide mononitrate 30 MG 24 hr tablet Commonly known as: IMDUR   linagliptin 5 MG Tabs tablet Commonly known as: Tradjenta   metoprolol succinate 25 MG 24 hr tablet Commonly known as: TOPROL-XL   simvastatin 20 MG tablet Commonly known as: ZOCOR       TAKE these medications    (feeding supplement) PROSource Plus liquid Take 30 mLs by mouth 3 (three) times daily between meals.   feeding supplement Liqd Take 296 mLs by mouth 3 (three) times daily.   Tylenol 8 Hour 650 MG CR tablet Generic drug: acetaminophen 650 mg every 4 (four) hours as needed for pain. Per MAR, tab, elixir or rectally.   acetaminophen 325 MG tablet Commonly known as: TYLENOL Take 2 tablets (650 mg total) by mouth every 6 (six) hours as needed for mild pain, headache or fever (or Fever >/= 101).   amoxicillin-clavulanate 250-62.5 MG/5ML suspension Commonly known as: AUGMENTIN Take 5 mLs (250 mg total) by mouth 2 (two) times daily for 6 days.   Artificial Tears 0.5-0.6 % Soln Generic drug: Polyvinyl Alcohol-Povidone Place 1 drop into both eyes daily as needed (dry eyes).   ascorbic acid 500 MG tablet Commonly known as: VITAMIN C Take 500 mg by mouth 2 (two) times daily.   diclofenac Sodium 1 % Gel Commonly known as: VOLTAREN Apply 4 g topically daily as needed (for pain). Apply to arthritic/gout joints   Eucerin Advanced Repair Foot Crea Apply 1 application  topically 2 (two) times daily. Apply to bilateral heels topically two times a day for hydration of skin prior to placing and after removing of compression hose.   famotidine 20 MG tablet Commonly known as: PEPCID Take 20 mg by mouth at bedtime.   ferrous sulfate 325 (65 FE) MG tablet Take 325 mg by mouth every morning.   Global Inject Ease Lancets 30G Misc See admin instructions.    levothyroxine 75 MCG tablet Commonly known as: SYNTHROID Take 0.5 tablets (37.5 mcg total) by mouth daily at 6 (six) AM.   melatonin 5 MG Tabs Take 5 mg by mouth at bedtime.   multivitamin with minerals Tabs tablet Take 1 tablet by mouth daily.   OneTouch Verio test strip Generic drug: glucose blood 1 each daily.   PRO-STAT 64 PO Take 30 mLs by mouth every morning.   Santyl 250 UNIT/GM ointment Generic drug: collagenase Apply 1 Application topically daily. Apply to Lt lateral foot topically every day shift for wound care.   traZODone 50 MG tablet Commonly known as: DESYREL Take 1 tablet (50 mg total) by mouth at bedtime.   Zinc 220 (50 Zn) MG Caps Take 1 capsule by mouth every morning.               Discharge Care Instructions  (From admission, onward)           Start     Ordered  10/09/22 0000  Discharge wound care:       Comments: Daily      Comments: Wound care to left lateral foot full thickness wound: Cleanse with soap and water, rinse and dry. Cover with size appropriate pice of silver hydrofiber (Aquacel Ag+, [place] # [id number]) and top with dry gauze. Secure with silicone foam and change daily. Place foot into Prevalon boot.   10/09/22 1610            Follow-up Information     [place], Tina. Schedule an appointment as soon as possible for a visit in 1 week(s).   Specialty: Skilled Nursing Facility Contact information: [address] [place] [address] [phone]                Allergies  Allergen Reactions   Codeine Nausea And Vomiting    Consultations: ID Palliative care   Procedures/Studies: ECHOCARDIOGRAM COMPLETE  Result Date: 10/06/2022    ECHOCARDIOGRAM REPORT   Patient Name:   Marie Hunt Date of Exam: 10/05/2022 Medical Rec #:  [id number]          Height:       64.0 in Accession #:    [id number]         Weight:       135.4 lb Date of Birth:  Mar 31, 1932          BSA:          1.657 m Patient Age:    89 years            BP:           159/73 mmHg Patient Gender: F                  HR:           86 bpm. [facility] Procedure: 2D Echo, Cardiac Doppler and Color Doppler Indications:    Bacteremia  History:        Patient has prior history of Echocardiogram examinations, most                 recent 10/04/2022. Abnormal ECG, Signs/Symptoms:Altered Mental                 Status and Murmur; Risk Factors:Dyslipidemia, Diabetes and                 Hypertension. Vascular dimentia. Pneumonia.  Sonographer:    Sheralyn Boatman RDCS Referring Phys: [id number] [facility]  Sonographer Comments: Technically difficult study due to poor echo windows. Unable to turn patient due to vascular dimentia. Every attempt was made to keep patient from screming out. Left arm against side. Apical window medial. Exam was attempted previous day. IMPRESSIONS  1. Left ventricular ejection fraction, by estimation, is 25 to 30%. The left ventricle has severely decreased function. The left ventricle demonstrates global hypokinesis. There is mild left ventricular hypertrophy. Left ventricular diastolic parameters  are indeterminate.  2. Right ventricular systolic function is mildly reduced. The right ventricular size is mildly enlarged. There is mildly elevated pulmonary artery systolic pressure.  3. Left atrial size was mildly dilated.  4. Large pleural effusion in the left lateral region.  5. The mitral valve is degenerative. Mild to moderate mitral valve regurgitation. Mild mitral stenosis. The mean mitral valve gradient is 3.0 mmHg.HR is 75. Moderate mitral annular calcification.  6. The tricuspid valve is abnormal.  7. The aortic valve is tricuspid. There is severe calcifcation of the aortic  valve. There is severe thickening of the aortic valve. Aortic valve regurgitation is mild. Mild aortic valve stenosis.  8. The inferior vena cava is normal in size with <50% respiratory variability, suggesting right atrial pressure of 8 mmHg. FINDINGS  Left  Ventricle: Left ventricular ejection fraction, by estimation, is 25 to 30%. The left ventricle has severely decreased function. The left ventricle demonstrates global hypokinesis. The left ventricular internal cavity size was normal in size. There is mild left ventricular hypertrophy. Left ventricular diastolic parameters are indeterminate. Right Ventricle: The right ventricular size is mildly enlarged. Right vetricular wall thickness was not well visualized. Right ventricular systolic function is mildly reduced. There is mildly elevated pulmonary artery systolic pressure. The tricuspid regurgitant velocity is 2.97 m/s, and with an assumed right atrial pressure of 8 mmHg, the estimated right ventricular systolic pressure is 43.3 mmHg. Left Atrium: Left atrial size was mildly dilated. Right Atrium: Right atrial size was normal in size. Pericardium: There is no evidence of pericardial effusion. Mitral Valve: The mitral valve is degenerative in appearance. There is moderate thickening of the mitral valve leaflet(s). There is moderate calcification of the mitral valve leaflet(s). Moderate mitral annular calcification. Mild to moderate mitral valve regurgitation. Mild mitral valve stenosis. The mean mitral valve gradient is 3.0 mmHg. Tricuspid Valve: The tricuspid valve is abnormal. Tricuspid valve regurgitation is mild . No evidence of tricuspid stenosis. Aortic Valve: The aortic valve is tricuspid. There is severe calcifcation of the aortic valve. There is severe thickening of the aortic valve. There is severe aortic valve annular calcification. Aortic valve regurgitation is mild. Aortic regurgitation PHT measures 505 msec. Mild aortic stenosis is present. Aortic valve mean gradient measures 13.5 mmHg. Aortic valve peak gradient measures 25.2 mmHg. Aortic valve area, by VTI measures 3.20 cm. Pulmonic Valve: The pulmonic valve was not well visualized. Pulmonic valve regurgitation is mild. No evidence of pulmonic  stenosis. Aorta: The aortic root and ascending aorta are structurally normal, with no evidence of dilitation. Venous: The inferior vena cava is normal in size with less than 50% respiratory variability, suggesting right atrial pressure of 8 mmHg. IAS/Shunts: The interatrial septum was not well visualized. Additional Comments: There is a large pleural effusion in the left lateral region.  LEFT VENTRICLE PLAX 2D LVIDd:         4.90 cm LVIDs:         4.40 cm LV PW:         1.20 cm LV IVS:        1.20 cm LVOT diam:     2.50 cm LV SV:         201 LV SV Index:   121 LVOT Area:     4.91 cm  LV Volumes (MOD) LV vol d, MOD A2C: 147.0 ml LV vol d, MOD A4C: 117.0 ml LV vol s, MOD A2C: 94.2 ml LV vol s, MOD A4C: 89.8 ml LV SV MOD A2C:     52.8 ml LV SV MOD A4C:     117.0 ml LV SV MOD BP:      38.6 ml RIGHT VENTRICLE            IVC RV S prime:     8.92 cm/s  IVC diam: 2.00 cm TAPSE (M-mode): 1.7 cm LEFT ATRIUM             Index        RIGHT ATRIUM           Index LA diam:  3.90 cm 2.35 cm/m   RA Area:     14.20 cm LA Vol (A2C):   65.6 ml 39.58 ml/m  RA Volume:   37.70 ml  22.75 ml/m LA Vol (A4C):   78.3 ml 47.25 ml/m LA Biplane Vol: 69.9 ml 42.18 ml/m  AORTIC VALVE                     PULMONIC VALVE AV Area (Vmax):    3.50 cm      PR End Diast Vel: 2.07 msec AV Area (Vmean):   3.66 cm AV Area (VTI):     3.20 cm AV Vmax:           251.00 cm/s AV Vmean:          169.000 cm/s AV VTI:            0.628 m AV Peak Grad:      25.2 mmHg AV Mean Grad:      13.5 mmHg LVOT Vmax:         179.00 cm/s LVOT Vmean:        126.000 cm/s LVOT VTI:          0.410 m LVOT/AV VTI ratio: 0.65 AI PHT:            505 msec  AORTA Ao Root diam: 3.00 cm Ao Asc diam:  3.80 cm MITRAL VALVE                TRICUSPID VALVE MV Area (PHT): 4.60 cm     TR Peak grad:   35.3 mmHg MV Mean grad:  3.0 mmHg     TR Vmax:        297.00 cm/s MV Decel Time: 165 msec MV E velocity: 128.00 cm/s  SHUNTS                             Systemic VTI:  0.41 m                              Systemic Diam: 2.50 cm Dina Rich MD Electronically signed by Dina Rich MD Signature Date/Time: 10/06/2022/3:10:57 PM    Final    [place] EKG SITE RITE  Result Date: 10/05/2022 If Site Rite image not attached, placement could not be confirmed due to current cardiac rhythm.  ECHOCARDIOGRAM COMPLETE  Result Date: 10/04/2022    ECHOCARDIOGRAM REPORT   Patient Name:   Marie Hunt Date of Exam: 10/04/2022 Medical Rec #:  [id number]          Height:       64.0 in Accession #:    [id number]         Weight:       135.4 lb Date of Birth:  May 16, 1932          BSA:          1.657 m Patient Age:    89 years           BP:           105/50 mmHg Patient Gender: F                  HR:           87 bpm. [facility] Procedure: 2D Echo, Cardiac Doppler and Color Doppler Indications:    Endocarditis.  History:  Patient has prior history of Echocardiogram examinations, most                 recent 08/12/2022. CHF, Aortic Valve Disease; Risk                 Factors:Hypertension, Diabetes and Dyslipidemia. Aortic                 stenosis.  Sonographer:    Sheralyn Boatman RDCS Referring Phys: [id number] [facility]  Sonographer Comments: Image acquisition challenging due to uncooperative patient. Patient would not let me continue exam. Patient yelled out to leave her alone. Patient clinched left arm to side and yelled so I could not get to apical region. RN notified. IMPRESSIONS  1. Left ventricular ejection fraction, by estimation, is 30 to 35%. The left ventricle has moderately decreased function. The left ventricle has no regional wall motion abnormalities. There is moderate concentric left ventricular hypertrophy. Left ventricular diastolic function could not be evaluated.  2. Right ventricular systolic function was not well visualized. The right ventricular size is not well visualized.  3. A small pericardial effusion is present. The pericardial effusion is posterior to the left  ventricle. Large pleural effusion in the left lateral region.  4. The mitral valve is degenerative. Mild mitral valve regurgitation. Mild mitral stenosis.  5. Tricuspid valve regurgitation is mild to moderate.  6. The aortic valve is calcified. Aortic valve regurgitation is mild.  7. Aortic dilatation noted. There is mild dilatation of the ascending aorta, measuring 37 mm. Comparison(s): Prior images reviewed side by side. LVEF appears worse than recent study. Incomplete study due to patient's inability complete exam. Conclusion(s)/Recommendation(s): If within goals of care, consider finishing study if patient is amenable. FINDINGS  Left Ventricle: Left ventricular ejection fraction, by estimation, is 30 to 35%. The left ventricle has moderately decreased function. The left ventricle has no regional wall motion abnormalities. The left ventricular internal cavity size was normal in size. There is moderate concentric left ventricular hypertrophy. Left ventricular diastolic function could not be evaluated. Right Ventricle: The right ventricular size is not well visualized. No increase in right ventricular wall thickness. Right ventricular systolic function was not well visualized. Left Atrium: Left atrial size was not well visualized. Right Atrium: Right atrial size was not well visualized. Pericardium: A small pericardial effusion is present. The pericardial effusion is posterior to the left ventricle. Mitral Valve: The mitral valve is degenerative in appearance. Mild mitral valve regurgitation. Mild mitral valve stenosis. Tricuspid Valve: The tricuspid valve is grossly normal. Tricuspid valve regurgitation is mild to moderate. No evidence of tricuspid stenosis. Aortic Valve: The aortic valve is calcified. Aortic valve regurgitation is mild. Pulmonic Valve: The pulmonic valve was normal in structure. Pulmonic valve regurgitation is not visualized. No evidence of pulmonic stenosis. Aorta: Aortic dilatation noted. There  is mild dilatation of the ascending aorta, measuring 37 mm. IAS/Shunts: No atrial level shunt detected by color flow Doppler. Additional Comments: There is a large pleural effusion in the left lateral region.  LEFT VENTRICLE PLAX 2D LVIDd:         4.50 cm LVIDs:         3.90 cm LV PW:         1.40 cm LV IVS:        1.40 cm LVOT diam:     2.30 cm LVOT Area:     4.15 cm  LEFT ATRIUM         Index LA diam:  4.20 cm 2.53 cm/m   AORTA Ao Root diam: 3.30 cm Ao Asc diam:  3.70 cm TRICUSPID VALVE TR Peak grad:   36.0 mmHg TR Vmax:        300.00 cm/s  SHUNTS Systemic Diam: 2.30 cm Riley Lam MD Electronically signed by Riley Lam MD Signature Date/Time: 10/04/2022/1:41:53 PM    Final    US THORACENTESIS ASP PLEURAL SPACE W/IMG GUIDE  Result Date: 10/03/2022 INDICATION: Right pleural effusion.  Hypoxia. EXAM: ULTRASOUND GUIDED right THORACENTESIS MEDICATIONS: None. COMPLICATIONS: None immediate. PROCEDURE: An ultrasound guided thoracentesis was thoroughly discussed with the patient and questions answered. The benefits, risks, alternatives and complications were also discussed. The patient understands and wishes to proceed with the procedure. Written consent was obtained. Ultrasound was performed to localize and mark an adequate pocket of fluid in the right chest. The area was then prepped and draped in the normal sterile fashion. 1% Lidocaine was used for local anesthesia. Under ultrasound guidance a 6 Fr Safe-T-Centesis catheter was introduced. Thoracentesis was performed. The catheter was removed and a dressing applied. FINDINGS: A total of approximately 350 mL of clear yellow fluid was removed. Samples were sent to the laboratory as requested by the clinical team. IMPRESSION: Successful ultrasound guided right thoracentesis yielding 350 mL of pleural fluid. Electronically Signed   By: Marin Roberts M.D.   On: 10/03/2022 15:19   DG Chest 1 View  Result Date: 10/03/2022 CLINICAL DATA:   Status post thoracentesis. EXAM: CHEST  1 VIEW COMPARISON:  None Available. FINDINGS: The heart is enlarged. Atherosclerotic changes are present at the aortic arch. Mild pulmonary vascular congestion is present. The right pleural effusion is decreased. No pneumothorax is present. IMPRESSION: 1. Decreased right pleural effusion without pneumothorax. 2. Cardiomegaly and mild pulmonary vascular congestion. Electronically Signed   By: Marin Roberts M.D.   On: 10/03/2022 15:03   CT FOOT LEFT WO CONTRAST  Result Date: 10/02/2022 CLINICAL DATA:  Left foot pain and swelling. Leukocytosis. History of diabetes. EXAM: CT OF THE LEFT FOOT WITHOUT CONTRAST TECHNIQUE: Multidetector CT imaging of the left foot was performed according to the standard protocol. Multiplanar CT image reconstructions were also generated. RADIATION DOSE REDUCTION: This exam was performed according to the departmental dose-optimization program which includes automated exposure control, adjustment of the mA and/or kV according to patient size and/or use of iterative reconstruction technique. COMPARISON:  Left foot radiographs 10/01/2022. No other relevant comparison imaging. FINDINGS: Bones/Joint/Cartilage The bones are diffusely demineralized. There is no evidence of acute fracture or dislocation. There are prominent erosions along the dorsal aspect of the 2nd tarsal metatarsal joint. These have sclerotic margins and may relate to underlying gout. No apparent overlying soft tissue swelling in this area. Additional mild subchondral cyst formation proximally in the medial cuneiform. Minimal degenerative changes at the 1st MTP joint. No bone destruction identified to suggest osteomyelitis. Ligaments Suboptimally assessed by CT. Muscles and Tendons As evaluated by CT, the ankle tendons appear intact without significant tenosynovitis. No focal muscular abnormalities are seen. Soft tissues Nonspecific generalized subcutaneous edema throughout the  foot and ankle. No focal fluid collection, foreign body or soft tissue emphysema identified. There are scattered vascular calcifications, typical of diabetes. IMPRESSION: 1. Nonspecific generalized subcutaneous edema throughout the foot and ankle without focal fluid collection, foreign body or soft tissue emphysema. 2. No evidence of osteomyelitis or acute fracture. 3. Prominent erosions along the dorsal aspect of the 2nd tarsometatarsal joint with sclerotic margins, possibly related to underlying gout. Correlate clinically. Electronically  Signed   By: Carey Bullocks M.D.   On: 10/02/2022 12:52   CT Chest Wo Contrast  Result Date: 10/01/2022 CLINICAL DATA:  Pneumonia, complication suspected, xray done. Abnormal x-ray EXAM: CT CHEST WITHOUT CONTRAST TECHNIQUE: Multidetector CT imaging of the chest was performed following the standard protocol without IV contrast. RADIATION DOSE REDUCTION: This exam was performed according to the departmental dose-optimization program which includes automated exposure control, adjustment of the mA and/or kV according to patient size and/or use of iterative reconstruction technique. COMPARISON:  X-ray today. FINDINGS: Cardiovascular: Cardiomegaly. Diffuse coronary artery and aortic atherosclerosis. No aneurysm. Mediastinum/Nodes: No mediastinal, hilar, or axillary adenopathy. Trachea and esophagus are unremarkable. Thyroid unremarkable. Lungs/Pleura: Small left pleural effusion and moderate right pleural effusion. Bilateral lower lobe airspace opacities dependently, favor atelectasis although is difficult to exclude pneumonia. Ground-glass airspace opacity posteriorly in the right upper lobe also could reflect pneumonia. Upper Abdomen: No acute findings Musculoskeletal: Chest wall soft tissues are unremarkable. No acute bony abnormality. IMPRESSION: Cardiomegaly, diffuse coronary artery disease. Moderate right pleural effusion and small left pleural effusion. Bilateral lower lobe  airspace opacities, mostly dependent. Favor atelectasis although pneumonia cannot be excluded. Ground-glass airspace disease posteriorly in the right upper lobe more concerning for pneumonia. Aortic Atherosclerosis (ICD10-I70.0). Electronically Signed   By: Charlett Nose M.D.   On: 10/01/2022 18:25   US Venous Img Lower  Left (DVT Study)  Result Date: 10/01/2022 CLINICAL DATA:  Swelling and pain. EXAM: LEFT LOWER EXTREMITY VENOUS DOPPLER ULTRASOUND TECHNIQUE: Gray-scale sonography with compression, as well as color and duplex ultrasound, were performed to evaluate the deep venous system(s) from the level of the common femoral vein through the popliteal and proximal calf veins. COMPARISON:  Left lower extremity venous duplex ultrasound 08/11/2022. FINDINGS: VENOUS Normal compressibility of the common femoral, superficial femoral, and popliteal veins, as well as the visualized calf veins. Visualized portions of profunda femoral vein and great saphenous vein unremarkable. No filling defects to suggest DVT on grayscale or color Doppler imaging. Doppler waveforms show normal direction of venous flow, normal respiratory plasticity and response to augmentation. Limited views of the contralateral common femoral vein are unremarkable. OTHER Left calf edema. Limitations: none IMPRESSION: Negative. Electronically Signed   By: Orvan Falconer M.D.   On: 10/01/2022 16:57   DG Tibia/Fibula Left  Result Date: 10/01/2022 CLINICAL DATA:  Pain and swelling.  Wound. EXAM: LEFT TIBIA AND FIBULA - 2 VIEW COMPARISON:  None Available. FINDINGS: Severe osteopenia. Soft tissue swelling diffusely greatest towards the ankle. Diffuse vascular calcifications. No definite erosive changes. If there is further concern of bone infection, additional workup as clinically directed for further sensitivity such as bone scan or MRI IMPRESSION: Soft tissue swelling.  Severe osteopenia. Electronically Signed   By: Karen Kays M.D.   On: 10/01/2022  16:23   DG Foot Complete Left  Result Date: 10/01/2022 CLINICAL DATA:  Wound to left foot.  Swelling and pain EXAM: LEFT FOOT - COMPLETE 3 VIEW COMPARISON:  None Available. FINDINGS: Severe osteopenia. No acute fracture or dislocation. Mild degenerative changes of the dorsal aspect of the midfoot. Calcaneal spurs are well corticated. Scattered vascular calcifications. No definite erosive changes. Diffuse soft tissue swelling. However if there is further concern of bone infection, MRI or bone scan may be useful for much higher sensitivity. IMPRESSION: Severe osteopenia with soft tissue swelling and degenerative changes. Electronically Signed   By: Karen Kays M.D.   On: 10/01/2022 16:22   DG Chest [facility] [address]  Result Date: 10/01/2022 CLINICAL DATA:  Hypoxia. EXAM: PORTABLE CHEST 1 VIEW COMPARISON:  August 11, 2022. FINDINGS: Mild cardiomegaly with central pulmonary vascular congestion. Stable interstitial densities are noted throughout both lungs which may represent scarring. Right basilar atelectasis or infiltrate is noted with small right pleural effusion. Bony thorax is unremarkable. IMPRESSION: Mild right basilar atelectasis or infiltrate is noted with small right pleural effusion. Mild cardiomegaly with central pulmonary vascular congestion. Stable chronic interstitial densities are noted bilaterally which may represent scarring. Electronically Signed   By: Lupita Raider M.D.   On: 10/01/2022 15:52     Discharge Exam: Vitals:   10/08/22 1922 10/09/22 0353  BP: 135/75 (!) 148/83  Pulse: 80 93  Resp: 20 19  Temp: 98.2 F (36.8 C) 98 F (36.7 C)  SpO2: 98% 100%   Vitals:   10/08/22 0330 10/08/22 1145 10/08/22 1922 10/09/22 0353  BP: 129/88 126/72 135/75 (!) 148/83  Pulse: 91 73 80 93  Resp: 19 20 20 19   Temp: 98.3 F (36.8 C) (!) 97.2 F (36.2 C) 98.2 F (36.8 C) 98 F (36.7 C)  TempSrc: Oral Oral Oral   SpO2: 98% 98% 98% 100%  Weight:        General: Pt is alert, awake, not in  acute distress Cardiovascular: RRR, S1/S2 +, no rubs, no gallops Respiratory: CTA bilaterally, no wheezing, no rhonchi, nasal cannula oxygen Abdominal: Soft, NT, ND, bowel sounds + Extremities: no edema, no cyanosis    The results of significant diagnostics from this hospitalization (including imaging, microbiology, ancillary and laboratory) are listed below for reference.     Microbiology: Recent Results (from the past 240 hour(s))  Culture, blood (Routine X 2) w Reflex to ID Panel     Status: None   Collection Time: 10/01/22  4:42 PM   Specimen: Right Antecubital; Blood  Result Value Ref Range Status   Specimen Description RIGHT ANTECUBITAL  Final   Special Requests   Final    BOTTLES DRAWN AEROBIC AND ANAEROBIC Blood Culture adequate volume   Culture   Final    NO GROWTH 5 DAYS Performed at [facility], [address], [place], [place] [address]    Report Status 10/06/2022 FINAL  Final  Culture, blood (Routine X 2) w Reflex to ID Panel     Status: Abnormal   Collection Time: 10/01/22  4:58 PM   Specimen: Left Antecubital; Blood  Result Value Ref Range Status   Specimen Description   Final    LEFT ANTECUBITAL Performed at [facility], [address], [place], [place] [address]    Special Requests   Final    BOTTLES DRAWN AEROBIC AND ANAEROBIC Blood Culture adequate volume Performed at [facility], [address], [place], [place] [address]    Culture  Setup Time   Final    GRAM POSITIVE COCCI IN CHAINS IN BOTH AEROBIC AND ANAEROBIC BOTTLES Gram Stain Report Called to,Read Back By and Verified With: CATES LAND, MICHELLE @ 0900 ON 10/02/2022 BY FRATTO,ASHLEY CRITICAL RESULT CALLED TO, READ BACK BY AND VERIFIED WITH: PHARMD WILL Dareen Piano [phone] AT 1401 BY CM    Culture (A)  Final    GROUP A STREP (S.PYOGENES) ISOLATED HEALTH DEPARTMENT NOTIFIED Performed at [facility], [address], [place], [place] [address]    Report Status 10/07/2022 FINAL  Final    Organism ID, Bacteria GROUP A STREP (S.PYOGENES) ISOLATED  Final      Susceptibility   Group a strep (s.pyogenes)  isolated - MIC*    PENICILLIN <=0.06 SENSITIVE Sensitive     CEFTRIAXONE <=0.12 SENSITIVE Sensitive     ERYTHROMYCIN 4 RESISTANT Resistant     LEVOFLOXACIN <=0.25 SENSITIVE Sensitive     VANCOMYCIN 0.5 SENSITIVE Sensitive     * GROUP A STREP (S.PYOGENES) ISOLATED  Blood Culture ID Panel (Reflexed)     Status: Abnormal   Collection Time: 10/01/22  4:58 PM  Result Value Ref Range Status   Enterococcus faecalis NOT DETECTED NOT DETECTED Final   Enterococcus Faecium NOT DETECTED NOT DETECTED Final   Listeria monocytogenes NOT DETECTED NOT DETECTED Final   Staphylococcus species NOT DETECTED NOT DETECTED Final   Staphylococcus aureus (BCID) NOT DETECTED NOT DETECTED Final   Staphylococcus epidermidis NOT DETECTED NOT DETECTED Final   Staphylococcus lugdunensis NOT DETECTED NOT DETECTED Final   Streptococcus species DETECTED (A) NOT DETECTED Final    Comment: CRITICAL RESULT CALLED TO, READ BACK BY AND VERIFIED WITH: PHARMD WILL Dareen Piano [id number] AT 1401 BY CM    Streptococcus agalactiae NOT DETECTED NOT DETECTED Final   Streptococcus pneumoniae NOT DETECTED NOT DETECTED Final   Streptococcus pyogenes DETECTED (A) NOT DETECTED Final    Comment: CRITICAL RESULT CALLED TO, READ BACK BY AND VERIFIED WITH: PHARMD WILL Dareen Piano [phone] AT 1401 BY CM    A.calcoaceticus-baumannii NOT DETECTED NOT DETECTED Final   Bacteroides fragilis NOT DETECTED NOT DETECTED Final   Enterobacterales NOT DETECTED NOT DETECTED Final   Enterobacter cloacae complex NOT DETECTED NOT DETECTED Final   Escherichia coli NOT DETECTED NOT DETECTED Final   Klebsiella aerogenes NOT DETECTED NOT DETECTED Final   Klebsiella oxytoca NOT DETECTED NOT DETECTED Final   Klebsiella pneumoniae NOT DETECTED NOT DETECTED Final   Proteus species NOT DETECTED NOT DETECTED Final   Salmonella species NOT DETECTED NOT DETECTED  Final   Serratia marcescens NOT DETECTED NOT DETECTED Final   Haemophilus influenzae NOT DETECTED NOT DETECTED Final   Neisseria meningitidis NOT DETECTED NOT DETECTED Final   Pseudomonas aeruginosa NOT DETECTED NOT DETECTED Final   Stenotrophomonas maltophilia NOT DETECTED NOT DETECTED Final   Candida albicans NOT DETECTED NOT DETECTED Final   Candida auris NOT DETECTED NOT DETECTED Final   Candida glabrata NOT DETECTED NOT DETECTED Final   Candida krusei NOT DETECTED NOT DETECTED Final   Candida parapsilosis NOT DETECTED NOT DETECTED Final   Candida tropicalis NOT DETECTED NOT DETECTED Final   Cryptococcus neoformans/gattii NOT DETECTED NOT DETECTED Final    Comment: Performed at [facility], [address], [place], [place] [address]  Resp panel by RT-PCR (RSV, Flu A&B, Covid) Anterior Nasal Swab     Status: None   Collection Time: 10/01/22  7:40 PM   Specimen: Anterior Nasal Swab  Result Value Ref Range Status   SARS Coronavirus 2 by RT PCR NEGATIVE NEGATIVE Final    Comment: (NOTE) SARS-CoV-2 target nucleic acids are NOT DETECTED.  The SARS-CoV-2 RNA is generally detectable in upper respiratory specimens during the acute phase of infection. The lowest concentration of SARS-CoV-2 viral copies this assay can detect is 138 copies/mL. A negative result does not preclude SARS-Cov-2 infection and should not be used as the sole basis for treatment or other patient management decisions. A negative result may occur with  improper specimen collection/handling, submission of specimen other than nasopharyngeal swab, presence of viral mutation(s) within the areas targeted by this assay, and inadequate number of viral copies(<138 copies/mL). A negative result must be combined with clinical observations, patient  history, and epidemiological information. The expected result is Negative.  Fact Sheet for Patients:  BloggerCourse.com  Fact Sheet for Healthcare  Providers:  SeriousBroker.it  This test is no t yet approved or cleared by the [place] FDA and  has been authorized for detection and/or diagnosis of SARS-CoV-2 by FDA under an Emergency Use Authorization (EUA). This EUA will remain  in effect (meaning this test can be used) for the duration of the COVID-19 declaration under Section 564(b)(1) of the Act, 21 U.S.C.section 360bbb-3(b)(1), unless the authorization is terminated  or revoked sooner.       Influenza A by PCR NEGATIVE NEGATIVE Final   Influenza B by PCR NEGATIVE NEGATIVE Final    Comment: (NOTE) The Xpert Xpress SARS-CoV-2/FLU/RSV plus assay is intended as an aid in the diagnosis of influenza from Nasopharyngeal swab specimens and should not be used as a sole basis for treatment. Nasal washings and aspirates are unacceptable for Xpert Xpress SARS-CoV-2/FLU/RSV testing.  Fact Sheet for Patients: BloggerCourse.com  Fact Sheet for Healthcare Providers: SeriousBroker.it  This test is not yet approved or cleared by the [place] FDA and has been authorized for detection and/or diagnosis of SARS-CoV-2 by FDA under an Emergency Use Authorization (EUA). This EUA will remain in effect (meaning this test can be used) for the duration of the COVID-19 declaration under Section 564(b)(1) of the Act, 21 U.S.C. section 360bbb-3(b)(1), unless the authorization is terminated or revoked.     Resp Syncytial Virus by PCR NEGATIVE NEGATIVE Final    Comment: (NOTE) Fact Sheet for Patients: BloggerCourse.com  Fact Sheet for Healthcare Providers: SeriousBroker.it  This test is not yet approved or cleared by the [place] FDA and has been authorized for detection and/or diagnosis of SARS-CoV-2 by FDA under an Emergency Use Authorization (EUA). This EUA will remain in effect (meaning this test can be  used) for the duration of the COVID-19 declaration under Section 564(b)(1) of the Act, 21 U.S.C. section 360bbb-3(b)(1), unless the authorization is terminated or revoked.  Performed at [facility], [address], [place], [place] [address]   Culture, blood (Routine X 2) w Reflex to ID Panel     Status: None   Collection Time: 10/02/22 10:01 AM   Specimen: BLOOD  Result Value Ref Range Status   Specimen Description BLOOD BLOOD RIGHT WRIST  Final   Special Requests   Final    BOTTLES DRAWN AEROBIC AND ANAEROBIC Blood Culture adequate volume   Culture   Final    NO GROWTH 6 DAYS Performed at [facility], [address] [place], [place] [address]    Report Status 10/08/2022 FINAL  Final  Culture, blood (Routine X 2) w Reflex to ID Panel     Status: None   Collection Time: 10/02/22 10:03 AM   Specimen: BLOOD  Result Value Ref Range Status   Specimen Description BLOOD BLOOD LEFT HAND  Final   Special Requests   Final    BOTTLES DRAWN AEROBIC AND ANAEROBIC Blood Culture adequate volume   Culture   Final    NO GROWTH 6 DAYS Performed at [facility], [address], [place], [place] [address]    Report Status 10/08/2022 FINAL  Final  Acid Fast Smear (AFB)     Status: None   Collection Time: 10/03/22  2:15 PM   Specimen: Thoracentesis  Result Value Ref Range Status   AFB Specimen Processing Concentration  Final   Acid Fast Smear Negative  Final    Comment: (NOTE)  Performed At: [facility] [address] [place], [place] [id number] Jolene Schimke MD WU:[id number]    Source (AFB) PLEURAL  Final    Comment: Performed at [facility], [address], [place], [place] [address]  Gram stain     Status: None   Collection Time: 10/03/22  2:15 PM   Specimen: Pleura  Result Value Ref Range Status   Specimen Description PLEURAL  Final   Special Requests NONE  Final   Gram Stain   Final    WBC PRESENT, PREDOMINANTLY MONONUCLEAR PLEURAL NO ORGANISMS SEEN CYTOSPIN  SMEAR Performed at [facility], [address], [place], [place] [address]    Report Status 10/03/2022 FINAL  Final  Culture, body fluid w Gram Stain-bottle     Status: None   Collection Time: 10/03/22  2:15 PM   Specimen: Pleura  Result Value Ref Range Status   Specimen Description PLEURAL  Final   Special Requests 10CC BOTTLES DRAWN AEROBIC AND ANAEROBIC  Final   Culture   Final    NO GROWTH 5 DAYS Performed at [facility], [address], [place], [place] [address]    Report Status 10/08/2022 FINAL  Final     Labs: BNP (last 3 results) Recent Labs    07/02/22 1555 08/11/22 1440  BNP 1,307.0* 246.0*   Basic Metabolic Panel: Recent Labs  Lab 10/05/22 0517 10/06/22 0450 10/07/22 0521 10/08/22 0402 10/09/22 0422  NA 135 136 135 133* 136  K 4.8 4.4 4.3 4.5 5.0  CL 107 106 107 106 107  CO2 18* 19* 18* 17* 16*  GLUCOSE 303* 182* 160* 203* 144*  BUN 59* 62* 57* 54* 53*  CREATININE 2.92* 2.88* 2.58* 2.06* 1.87*  CALCIUM 8.4* 8.7* 8.5* 8.6* 9.1  MG 2.3 2.3 2.2 2.2 2.2   Liver Function Tests: No results for input(s): "AST", "ALT", "ALKPHOS", "BILITOT", "PROT", "ALBUMIN" in the last 168 hours. No results for input(s): "LIPASE", "AMYLASE" in the last 168 hours. No results for input(s): "AMMONIA" in the last 168 hours. CBC: Recent Labs  Lab 10/05/22 0517 10/06/22 0450 10/07/22 0521 10/08/22 0402 10/09/22 0422  WBC 27.8* 21.0* 12.6* 13.1* 13.1*  HGB 9.3* 9.2* 9.3* 9.8* 11.2*  HCT 29.3* 28.9* 29.6* 31.6* 35.5*  MCV 84.0 83.3 83.9 83.4 83.3  PLT 186 206 174 180 189   Cardiac Enzymes: No results for input(s): "CKTOTAL", "CKMB", "CKMBINDEX", "TROPONINI" in the last 168 hours. BNP: Invalid input(s): "POCBNP" CBG: Recent Labs  Lab 10/08/22 2135 10/09/22 0039 10/09/22 0351 10/09/22 0726 10/09/22 1121  GLUCAP 299* 232* 153* 168* 156*   D-Dimer No results for input(s): "DDIMER" in the last 72 hours. Hgb A1c No results for input(s): "HGBA1C" in the last 72  hours. Lipid Profile No results for input(s): "CHOL", "HDL", "LDLCALC", "TRIG", "CHOLHDL", "LDLDIRECT" in the last 72 hours. Thyroid function studies No results for input(s): "TSH", "T4TOTAL", "T3FREE", "THYROIDAB" in the last 72 hours.  Invalid input(s): "FREET3" Anemia work up No results for input(s): "VITAMINB12", "FOLATE", "FERRITIN", "TIBC", "IRON", "RETICCTPCT" in the last 72 hours. Urinalysis    Component Value Date/Time   COLORURINE STRAW (A) 08/11/2022 2007   APPEARANCEUR CLEAR 08/11/2022 2007   APPEARANCEUR Cloudy (A) 01/01/2022 1035   LABSPEC 1.008 08/11/2022 2007   PHURINE 5.0 08/11/2022 2007   GLUCOSEU NEGATIVE 08/11/2022 2007   HGBUR NEGATIVE 08/11/2022 2007   BILIRUBINUR NEGATIVE 08/11/2022 2007   BILIRUBINUR Negative 01/01/2022 1035   KETONESUR NEGATIVE 08/11/2022 2007   PROTEINUR NEGATIVE 08/11/2022 2007   UROBILINOGEN  negative (A) 07/13/2019 1536   UROBILINOGEN 0.2 10/06/2014 1505   NITRITE NEGATIVE 08/11/2022 2007   LEUKOCYTESUR SMALL (A) 08/11/2022 2007   Sepsis Labs Recent Labs  Lab 10/06/22 0450 10/07/22 0521 10/08/22 0402 10/09/22 0422  WBC 21.0* 12.6* 13.1* 13.1*   Microbiology Recent Results (from the past 240 hour(s))  Culture, blood (Routine X 2) w Reflex to ID Panel     Status: None   Collection Time: 10/01/22  4:42 PM   Specimen: Right Antecubital; Blood  Result Value Ref Range Status   Specimen Description RIGHT ANTECUBITAL  Final   Special Requests   Final    BOTTLES DRAWN AEROBIC AND ANAEROBIC Blood Culture adequate volume   Culture   Final    NO GROWTH 5 DAYS Performed at [facility], [address], [place], [place] [address]    Report Status 10/06/2022 FINAL  Final  Culture, blood (Routine X 2) w Reflex to ID Panel     Status: Abnormal   Collection Time: 10/01/22  4:58 PM   Specimen: Left Antecubital; Blood  Result Value Ref Range Status   Specimen Description   Final    LEFT ANTECUBITAL Performed at [facility], [address], [place], [place] [address]    Special Requests   Final    BOTTLES DRAWN AEROBIC AND ANAEROBIC Blood Culture adequate volume Performed at [facility], [address], [place], [place] [address]    Culture  Setup Time   Final    GRAM POSITIVE COCCI IN CHAINS IN BOTH AEROBIC AND ANAEROBIC BOTTLES Gram Stain Report Called to,Read Back By and Verified With: CATES LAND, MICHELLE @ 0900 ON 10/02/2022 BY FRATTO,ASHLEY CRITICAL RESULT CALLED TO, READ BACK BY AND VERIFIED WITH: PHARMD WILL Dareen Piano [phone] AT 1401 BY CM    Culture (A)  Final    GROUP A STREP (S.PYOGENES) ISOLATED HEALTH DEPARTMENT NOTIFIED Performed at [facility], [address] [place], [place] [address]    Report Status 10/07/2022 FINAL  Final   Organism ID, Bacteria GROUP A STREP (S.PYOGENES) ISOLATED  Final      Susceptibility   Group a strep (s.pyogenes) isolated - MIC*    PENICILLIN <=0.06 SENSITIVE Sensitive     CEFTRIAXONE <=0.12 SENSITIVE Sensitive     ERYTHROMYCIN 4 RESISTANT Resistant     LEVOFLOXACIN <=0.25 SENSITIVE Sensitive     VANCOMYCIN 0.5 SENSITIVE Sensitive     * GROUP A STREP (S.PYOGENES) ISOLATED  Blood Culture ID Panel (Reflexed)     Status: Abnormal   Collection Time: 10/01/22  4:58 PM  Result Value Ref Range Status   Enterococcus faecalis NOT DETECTED NOT DETECTED Final   Enterococcus Faecium NOT DETECTED NOT DETECTED Final   Listeria monocytogenes NOT DETECTED NOT DETECTED Final   Staphylococcus species NOT DETECTED NOT DETECTED Final   Staphylococcus aureus (BCID) NOT DETECTED NOT DETECTED Final   Staphylococcus epidermidis NOT DETECTED NOT DETECTED Final   Staphylococcus lugdunensis NOT DETECTED NOT DETECTED Final   Streptococcus species DETECTED (A) NOT DETECTED Final    Comment: CRITICAL RESULT CALLED TO, READ BACK BY AND VERIFIED WITH: PHARMD WILL Dareen Piano [phone] AT 1401 BY CM    Streptococcus agalactiae NOT DETECTED NOT DETECTED Final   Streptococcus pneumoniae NOT DETECTED  NOT DETECTED Final   Streptococcus pyogenes DETECTED (A) NOT DETECTED Final    Comment: CRITICAL RESULT CALLED TO, READ BACK BY AND VERIFIED WITH: PHARMD WILL ANDERSON [phone] AT 1401 BY CM    A.calcoaceticus-baumannii NOT DETECTED NOT  DETECTED Final   Bacteroides fragilis NOT DETECTED NOT DETECTED Final   Enterobacterales NOT DETECTED NOT DETECTED Final   Enterobacter cloacae complex NOT DETECTED NOT DETECTED Final   Escherichia coli NOT DETECTED NOT DETECTED Final   Klebsiella aerogenes NOT DETECTED NOT DETECTED Final   Klebsiella oxytoca NOT DETECTED NOT DETECTED Final   Klebsiella pneumoniae NOT DETECTED NOT DETECTED Final   Proteus species NOT DETECTED NOT DETECTED Final   Salmonella species NOT DETECTED NOT DETECTED Final   Serratia marcescens NOT DETECTED NOT DETECTED Final   Haemophilus influenzae NOT DETECTED NOT DETECTED Final   Neisseria meningitidis NOT DETECTED NOT DETECTED Final   Pseudomonas aeruginosa NOT DETECTED NOT DETECTED Final   Stenotrophomonas maltophilia NOT DETECTED NOT DETECTED Final   Candida albicans NOT DETECTED NOT DETECTED Final   Candida auris NOT DETECTED NOT DETECTED Final   Candida glabrata NOT DETECTED NOT DETECTED Final   Candida krusei NOT DETECTED NOT DETECTED Final   Candida parapsilosis NOT DETECTED NOT DETECTED Final   Candida tropicalis NOT DETECTED NOT DETECTED Final   Cryptococcus neoformans/gattii NOT DETECTED NOT DETECTED Final    Comment: Performed at [facility], [address], [place], [place] [address]  Resp panel by RT-PCR (RSV, Flu A&B, Covid) Anterior Nasal Swab     Status: None   Collection Time: 10/01/22  7:40 PM   Specimen: Anterior Nasal Swab  Result Value Ref Range Status   SARS Coronavirus 2 by RT PCR NEGATIVE NEGATIVE Final    Comment: (NOTE) SARS-CoV-2 target nucleic acids are NOT DETECTED.  The SARS-CoV-2 RNA is generally detectable in upper respiratory specimens during the acute phase of infection. The  lowest concentration of SARS-CoV-2 viral copies this assay can detect is 138 copies/mL. A negative result does not preclude SARS-Cov-2 infection and should not be used as the sole basis for treatment or other patient management decisions. A negative result may occur with  improper specimen collection/handling, submission of specimen other than nasopharyngeal swab, presence of viral mutation(s) within the areas targeted by this assay, and inadequate number of viral copies(<138 copies/mL). A negative result must be combined with clinical observations, patient history, and epidemiological information. The expected result is Negative.  Fact Sheet for Patients:  BloggerCourse.com  Fact Sheet for Healthcare Providers:  SeriousBroker.it  This test is no t yet approved or cleared by the [place] FDA and  has been authorized for detection and/or diagnosis of SARS-CoV-2 by FDA under an Emergency Use Authorization (EUA). This EUA will remain  in effect (meaning this test can be used) for the duration of the COVID-19 declaration under Section 564(b)(1) of the Act, 21 U.S.C.section 360bbb-3(b)(1), unless the authorization is terminated  or revoked sooner.       Influenza A by PCR NEGATIVE NEGATIVE Final   Influenza B by PCR NEGATIVE NEGATIVE Final    Comment: (NOTE) The Xpert Xpress SARS-CoV-2/FLU/RSV plus assay is intended as an aid in the diagnosis of influenza from Nasopharyngeal swab specimens and should not be used as a sole basis for treatment. Nasal washings and aspirates are unacceptable for Xpert Xpress SARS-CoV-2/FLU/RSV testing.  Fact Sheet for Patients: BloggerCourse.com  Fact Sheet for Healthcare Providers: SeriousBroker.it  This test is not yet approved or cleared by the [place] FDA and has been authorized for detection and/or diagnosis of SARS-CoV-2 by FDA under  an Emergency Use Authorization (EUA). This EUA will remain in effect (meaning this test can be used) for the duration of the COVID-19 declaration  under Section 564(b)(1) of the Act, 21 U.S.C. section 360bbb-3(b)(1), unless the authorization is terminated or revoked.     Resp Syncytial Virus by PCR NEGATIVE NEGATIVE Final    Comment: (NOTE) Fact Sheet for Patients: BloggerCourse.com  Fact Sheet for Healthcare Providers: SeriousBroker.it  This test is not yet approved or cleared by the [place] FDA and has been authorized for detection and/or diagnosis of SARS-CoV-2 by FDA under an Emergency Use Authorization (EUA). This EUA will remain in effect (meaning this test can be used) for the duration of the COVID-19 declaration under Section 564(b)(1) of the Act, 21 U.S.C. section 360bbb-3(b)(1), unless the authorization is terminated or revoked.  Performed at [facility], [address] [place], [place] [address]   Culture, blood (Routine X 2) w Reflex to ID Panel     Status: None   Collection Time: 10/02/22 10:01 AM   Specimen: BLOOD  Result Value Ref Range Status   Specimen Description BLOOD BLOOD RIGHT WRIST  Final   Special Requests   Final    BOTTLES DRAWN AEROBIC AND ANAEROBIC Blood Culture adequate volume   Culture   Final    NO GROWTH 6 DAYS Performed at [facility], [address] [place], [place] [address]    Report Status 10/08/2022 FINAL  Final  Culture, blood (Routine X 2) w Reflex to ID Panel     Status: None   Collection Time: 10/02/22 10:03 AM   Specimen: BLOOD  Result Value Ref Range Status   Specimen Description BLOOD BLOOD LEFT HAND  Final   Special Requests   Final    BOTTLES DRAWN AEROBIC AND ANAEROBIC Blood Culture adequate volume   Culture   Final    NO GROWTH 6 DAYS Performed at [facility], [address], [place], [place] [address]    Report Status 10/08/2022 FINAL  Final  Acid Fast Smear (AFB)      Status: None   Collection Time: 10/03/22  2:15 PM   Specimen: Thoracentesis  Result Value Ref Range Status   AFB Specimen Processing Concentration  Final   Acid Fast Smear Negative  Final    Comment: (NOTE) Performed At: [facility] [address] [place], [place] [id number] Jolene Schimke MD UX:[id number]    Source (AFB) PLEURAL  Final    Comment: Performed at [facility], [address] [place], [place] [address]  Gram stain     Status: None   Collection Time: 10/03/22  2:15 PM   Specimen: Pleura  Result Value Ref Range Status   Specimen Description PLEURAL  Final   Special Requests NONE  Final   Gram Stain   Final    WBC PRESENT, PREDOMINANTLY MONONUCLEAR PLEURAL NO ORGANISMS SEEN CYTOSPIN SMEAR Performed at [facility], [address] [place], [place] [address]    Report Status 10/03/2022 FINAL  Final  Culture, body fluid w Gram Stain-bottle     Status: None   Collection Time: 10/03/22  2:15 PM   Specimen: Pleura  Result Value Ref Range Status   Specimen Description PLEURAL  Final   Special Requests 10CC BOTTLES DRAWN AEROBIC AND ANAEROBIC  Final   Culture   Final    NO GROWTH 5 DAYS Performed at [facility], [address], [place], [place] [address]    Report Status 10/08/2022 FINAL  Final     Time coordinating discharge: 35 minutes  SIGNED:   Erick Blinks, DO [facility] 10/09/2022, 12:15 PM  If 7PM-7AM, please contact night-coverage  [email or web address]

## 2022-10-09 NOTE — Care Management Important Message (Signed)
Important Message  Patient Details  Name: Marie Hunt MRN: [id number] Date of Birth: December 31, 1932   Medicare Important Message Given:  Yes (copy given to grandaughter)     Corey Harold 10/09/2022, 1:06 PM

## 2022-10-09 NOTE — Evaluation (Addendum)
Physical Therapy Evaluation Patient Details Name: Marie Hunt MRN: [id number] DOB: [birth year] Today's Date: 10/09/2022  History of Present Illness  Marie Hunt is a 89 y.o. female with medical history significant for systolic and diastolic CHF, diabetes mellitus, dementia, hypertension.  Patient was brought to the ED via EMS for reports of pain to the left foot, patient unable to ambulate.  She has also noticed increased work of breathing from her baseline as well as worsening chronic cough.  She was admitted for multifocal pneumonia with bilateral pleural effusions and started on IV antibiotics.  Patient is not eating and did not tolerate NG tube placement today.  Overall appears to be declining.  Calorie count ordered and SLP evaluation revealing need for dysphagia 2 diet.   Clinical Impression  Pt participation in session limited due to pain in LLE and weakness. Daughters state she was ambulatory with an AD PRN prior to admission. Required max assist from therapist to sit upright at EOB as well as stand with RW for a short period of time. Patient will benefit from continued skilled physical therapy in hospital and recommended venue below to increase strength, balance, endurance for safe ADLs and gait.         If plan is discharge home, recommend the following: A lot of help with bathing/dressing/bathroom;A lot of help with walking and/or transfers;Assistance with cooking/housework;Assist for transportation;Assistance with feeding;Help with stairs or ramp for entrance   Can travel by private vehicle        Equipment Recommendations None recommended by PT  Recommendations for Other Services       Functional Status Assessment Patient has had a recent decline in their functional status and demonstrates the ability to make significant improvements in function in a reasonable and predictable amount of time.     Precautions / Restrictions Precautions Precautions:  Fall Restrictions Weight Bearing Restrictions: Yes LLE Weight Bearing: Weight bearing as tolerated      Mobility  Bed Mobility Overal bed mobility: Needs Assistance Bed Mobility: Sidelying to Sit, Supine to Sit   Sidelying to sit: Max assist Supine to sit: Max assist     General bed mobility comments: Pt required max assist from therapist to sit upright at EOB Patient Response: Anxious  Transfers Overall transfer level: Needs assistance Equipment used: Rolling walker (2 wheels) Transfers: Sit to/from Stand Sit to Stand: Max assist, Mod assist           General transfer comment: Pt unable to stand on own without assist from therapist and RW    Ambulation/Gait   Gait Distance (Feet): 0 Feet (Pt unable to ambulate due to pain in LE and weakness)     Gait velocity: N/A     General Gait Details: Pt limited to standing at bedside for a short period of time  Stairs            Wheelchair Mobility     Tilt Bed Tilt Bed Patient Response: Anxious  Modified Rankin (Stroke Patients Only)       Balance Overall balance assessment: Needs assistance Sitting-balance support: Feet supported, Bilateral upper extremity supported Sitting balance-Leahy Scale: Poor Sitting balance - Comments: Poor seated at EOB, significant posterior leaning and apprehension Postural control: Posterior lean, Right lateral lean Standing balance support: Bilateral upper extremity supported, During functional activity, Reliant on assistive device for balance Standing balance-Leahy Scale: Poor Standing balance comment: poor with use of RW  Pertinent Vitals/Pain Pain Assessment Pain Assessment: Faces Faces Pain Scale: Hurts whole lot Pain Location: LLE Pain Descriptors / Indicators: Crying, Discomfort, Grimacing, Guarding, Moaning Pain Intervention(s): Limited activity within patient's tolerance, Monitored during session    Home Living  Family/patient expects to be discharged to:: Skilled nursing facility Living Arrangements:  (Long term facility) Available Help at Discharge: Available PRN/intermittently;Family             Home Equipment: Agricultural consultant (2 wheels);Wheelchair - manual Additional Comments: not on O2 at home, pt presented to therapy on 5L of O2 via [place]    Prior Function Prior Level of Function : Needs assist       Physical Assist : Mobility (physical);ADLs (physical) Mobility (physical): Bed mobility;Transfers;Gait;Stairs   Mobility Comments: per pt's two daughters she was able to ambulate with RW around house ADLs Comments: assisted by family     Extremity/Trunk Assessment                Communication   Communication Communication: Hearing impairment;No apparent difficulties Cueing Techniques: Verbal cues;Gestural cues;Tactile cues  Cognition Arousal: Alert Behavior During Therapy: WFL for tasks assessed/performed Overall Cognitive Status: Within Functional Limits for tasks assessed                                          General Comments      Exercises     Assessment/Plan    PT Assessment Patient needs continued PT services  PT Problem List Decreased strength;Decreased range of motion;Decreased activity tolerance;Decreased balance;Decreased mobility       PT Treatment Interventions DME instruction;Gait training;Stair training;Functional mobility training;Therapeutic activities;Therapeutic exercise;Balance training    PT Goals (Current goals can be found in the Care Plan section)  Acute Rehab PT Goals Patient Stated Goal: Pt unable to participate in goal making during session PT Goal Formulation: With patient Time For Goal Achievement: 10/23/22 Potential to Achieve Goals: Fair    Frequency Min 3X/week     Co-evaluation               AM-PAC PT "6 Clicks" Mobility  Outcome Measure Help needed turning from your back to your side while in a  flat bed without using bedrails?: A Lot Help needed moving from lying on your back to sitting on the side of a flat bed without using bedrails?: A Lot Help needed moving to and from a bed to a chair (including a wheelchair)?: A Lot Help needed standing up from a chair using your arms (e.g., wheelchair or bedside chair)?: A Lot Help needed to walk in hospital room?: Total Help needed climbing 3-5 steps with a railing? : Total 6 Click Score: 10    End of Session Equipment Utilized During Treatment: Gait belt Activity Tolerance: Patient limited by pain Patient left: in bed;with call bell/phone within reach;with family/visitor present Nurse Communication: Mobility status PT Visit Diagnosis: Unsteadiness on feet (R26.81);Other abnormalities of gait and mobility (R26.89);Muscle weakness (generalized) (M62.81);History of falling (Z91.81)    Time: 1308-6578 PT Time Calculation (min) (ACUTE ONLY): 26 min   Charges:   PT Evaluation $PT Eval Moderate Complexity: 1 Mod PT Treatments $Therapeutic Activity: 23-37 mins PT General Charges $$ ACUTE PT VISIT: 1 Visit          [facility] [place], DPT Program

## 2022-10-09 NOTE — Plan of Care (Signed)
  Problem: Education: Goal: Knowledge of General Education information will improve Description: Including pain rating scale, medication(s)/side effects and non-pharmacologic comfort measures Outcome: Progressing   Problem: Clinical Measurements: Goal: Respiratory complications will improve Outcome: Progressing   Problem: Clinical Measurements: Goal: Diagnostic test results will improve Outcome: Progressing   Problem: Nutrition: Goal: Adequate nutrition will be maintained Outcome: Progressing

## 2022-10-09 NOTE — Progress Notes (Signed)
Palliative: Marie Hunt is lying quietly in bed.  She appears chronically ill and frail.  She is resting comfortably, but wakes easily when I call her name.  She is hard of hearing with some memory loss therefore I do not ask orientation questions.  I am not sure that she can make her basic needs known.  Her daughter is present at bedside.  Her granddaughter has left the hospital to attend to needs for her discharge today.  Marie Hunt's daughter and I talk about her acute health concerns.  I share that she we have found no reversible causes for her frailty and functional decline.  We talk about failure to thrive.  I shared that it is expected that she will continue to experience failure to thrive and poor by mouth intake.  I encourage family to lean on workers at [employer] for continued care.  Daughter shares her faith and her experience with people who are nearing end-of-life.  Daughter shares that she has been "preparing herself".  We talk about what is normal and expected.  Conference with attending, bedside nursing staff, transition of care team related to patient condition, needs, goals of care, disposition.  Plan: Return to [facility] under long-term care where she has been for approximately 6 weeks.  Outpatient palliative services to follow.  Would benefit from hospice care. DNR/goldenrod form completed and placed on chart.  35 minutes  Lillia Carmel, NP Palliative medicine team Team phone [phone] Greater than 50% of this time was spent counseling and coordinating care related to the above assessment and plan.

## 2022-10-13 DIAGNOSIS — I5042 Chronic combined systolic (congestive) and diastolic (congestive) heart failure: Secondary | ICD-10-CM | POA: Diagnosis not present

## 2022-10-13 DIAGNOSIS — R1311 Dysphagia, oral phase: Secondary | ICD-10-CM | POA: Diagnosis not present

## 2022-10-13 DIAGNOSIS — L03116 Cellulitis of left lower limb: Secondary | ICD-10-CM | POA: Diagnosis not present

## 2022-10-13 DIAGNOSIS — Z7189 Other specified counseling: Secondary | ICD-10-CM | POA: Diagnosis not present

## 2022-10-13 DIAGNOSIS — Z79899 Other long term (current) drug therapy: Secondary | ICD-10-CM | POA: Diagnosis not present

## 2022-10-14 ENCOUNTER — Encounter: Payer: Self-pay | Admitting: Nurse Practitioner

## 2022-10-14 ENCOUNTER — Ambulatory Visit: Payer: 59 | Attending: Nurse Practitioner | Admitting: Nurse Practitioner

## 2022-10-14 VITALS — BP 132/64 | HR 93 | Ht 64.0 in | Wt 125.0 lb

## 2022-10-14 DIAGNOSIS — E8770 Fluid overload, unspecified: Secondary | ICD-10-CM | POA: Diagnosis not present

## 2022-10-14 DIAGNOSIS — I509 Heart failure, unspecified: Secondary | ICD-10-CM | POA: Diagnosis not present

## 2022-10-14 DIAGNOSIS — I517 Cardiomegaly: Secondary | ICD-10-CM | POA: Diagnosis not present

## 2022-10-14 DIAGNOSIS — K5900 Constipation, unspecified: Secondary | ICD-10-CM | POA: Diagnosis not present

## 2022-10-14 DIAGNOSIS — I5020 Unspecified systolic (congestive) heart failure: Secondary | ICD-10-CM

## 2022-10-14 DIAGNOSIS — I130 Hypertensive heart and chronic kidney disease with heart failure and stage 1 through stage 4 chronic kidney disease, or unspecified chronic kidney disease: Secondary | ICD-10-CM | POA: Diagnosis not present

## 2022-10-14 DIAGNOSIS — R404 Transient alteration of awareness: Secondary | ICD-10-CM | POA: Diagnosis not present

## 2022-10-14 DIAGNOSIS — N189 Chronic kidney disease, unspecified: Secondary | ICD-10-CM | POA: Diagnosis not present

## 2022-10-14 DIAGNOSIS — Z79899 Other long term (current) drug therapy: Secondary | ICD-10-CM | POA: Diagnosis not present

## 2022-10-14 DIAGNOSIS — E1122 Type 2 diabetes mellitus with diabetic chronic kidney disease: Secondary | ICD-10-CM | POA: Diagnosis not present

## 2022-10-14 DIAGNOSIS — I5043 Acute on chronic combined systolic (congestive) and diastolic (congestive) heart failure: Secondary | ICD-10-CM | POA: Diagnosis not present

## 2022-10-14 DIAGNOSIS — Z6820 Body mass index (BMI) 20.0-20.9, adult: Secondary | ICD-10-CM | POA: Diagnosis not present

## 2022-10-14 DIAGNOSIS — I2510 Atherosclerotic heart disease of native coronary artery without angina pectoris: Secondary | ICD-10-CM | POA: Diagnosis not present

## 2022-10-14 DIAGNOSIS — E875 Hyperkalemia: Secondary | ICD-10-CM | POA: Diagnosis not present

## 2022-10-14 DIAGNOSIS — Z66 Do not resuscitate: Secondary | ICD-10-CM | POA: Diagnosis not present

## 2022-10-14 DIAGNOSIS — E039 Hypothyroidism, unspecified: Secondary | ICD-10-CM | POA: Diagnosis not present

## 2022-10-14 DIAGNOSIS — I2489 Other forms of acute ischemic heart disease: Secondary | ICD-10-CM | POA: Diagnosis not present

## 2022-10-14 DIAGNOSIS — J9611 Chronic respiratory failure with hypoxia: Secondary | ICD-10-CM | POA: Diagnosis not present

## 2022-10-14 DIAGNOSIS — I129 Hypertensive chronic kidney disease with stage 1 through stage 4 chronic kidney disease, or unspecified chronic kidney disease: Secondary | ICD-10-CM | POA: Diagnosis not present

## 2022-10-14 DIAGNOSIS — Z87891 Personal history of nicotine dependence: Secondary | ICD-10-CM | POA: Diagnosis not present

## 2022-10-14 DIAGNOSIS — I6529 Occlusion and stenosis of unspecified carotid artery: Secondary | ICD-10-CM | POA: Diagnosis not present

## 2022-10-14 DIAGNOSIS — Z9981 Dependence on supplemental oxygen: Secondary | ICD-10-CM | POA: Diagnosis not present

## 2022-10-14 DIAGNOSIS — I5042 Chronic combined systolic (congestive) and diastolic (congestive) heart failure: Secondary | ICD-10-CM | POA: Diagnosis not present

## 2022-10-14 DIAGNOSIS — I21A1 Myocardial infarction type 2: Secondary | ICD-10-CM | POA: Diagnosis not present

## 2022-10-14 DIAGNOSIS — Z7401 Bed confinement status: Secondary | ICD-10-CM | POA: Diagnosis not present

## 2022-10-14 DIAGNOSIS — I700 Atherosclerosis of aorta: Secondary | ICD-10-CM | POA: Diagnosis not present

## 2022-10-14 DIAGNOSIS — Z743 Need for continuous supervision: Secondary | ICD-10-CM | POA: Diagnosis not present

## 2022-10-14 DIAGNOSIS — E43 Unspecified severe protein-calorie malnutrition: Secondary | ICD-10-CM | POA: Diagnosis not present

## 2022-10-14 DIAGNOSIS — Z794 Long term (current) use of insulin: Secondary | ICD-10-CM | POA: Diagnosis not present

## 2022-10-14 DIAGNOSIS — R0602 Shortness of breath: Secondary | ICD-10-CM

## 2022-10-14 DIAGNOSIS — R918 Other nonspecific abnormal finding of lung field: Secondary | ICD-10-CM | POA: Diagnosis not present

## 2022-10-14 DIAGNOSIS — R7989 Other specified abnormal findings of blood chemistry: Secondary | ICD-10-CM | POA: Diagnosis not present

## 2022-10-14 DIAGNOSIS — R6889 Other general symptoms and signs: Secondary | ICD-10-CM | POA: Diagnosis not present

## 2022-10-14 DIAGNOSIS — Z7982 Long term (current) use of aspirin: Secondary | ICD-10-CM | POA: Diagnosis not present

## 2022-10-14 NOTE — Progress Notes (Signed)
Cardiology Office Note:  .   Date:  10/14/2022  ID:  Marie Hunt, DOB 08-12-1932, MRN [id number] PCP: Lindaann Pascal  [facility] Providers Cardiologist:  Marjo Bicker, MD    History of Present Illness: .   Marie Hunt is a 89 y.o. female with a PMH of HFrEF (chronic systolic CHF), carotid artery stenosis, s/p left CEA in 2012, hyperlipidemia, hypertension, hypothyroidism, T2DM, aortic valve stenosis, CKD stage IV, dementia, and history of dyspnea on exertion.  She was admitted May 2024 for acute HFrEF.  TTE at the time revealed EF 30 to 35%, RWMA, grade 1 DD, normal RV, mild MR, mild AI, aortic valve calcification with mild to moderate aortic valve stenosis with peak gradient at 23.3 mmHg.  It was felt that her aortic valve stenosis was contributing to some exertional dyspnea.  GDMT was limited by her CKD.  Last seen by Eligha Bridegroom, NP on Jul 17, 2022.  Patient did note a productive cough with yellowish sputum.  Also noted bilateral lower extremity edema.  She was still reported to be very active at 89 years old and independent, performing her own ADLs. Reported feeling better since hospital d/c.   Admitted in early August 2024 for left foot pain, was unable to ambulate on her left foot, had some increased work of breathing also noted.  Was admitted for treatment of left leg cellulitis and associated wound and was noted to have strep bacteremia.  Received IV antibiotics, was followed by infectious disease.  TTE was negative for endocarditis.  Shortness of breath was related to volume overload and also had acute hypoxic respiratory failure likely due to multifocal pneumonia and moderate right pleural effusion with small left pleural effusion, breathing improved after thoracentesis.  Also had an episode of acute gout that was resolved after IV steroids.  Was noted to have poor appetite and generalized weakness, SLP recommended dysphagia 2 diet.  Followed by palliative  care and appeared to be hospice appropriate.  Was recommended she be closely monitored by outpatient palliative care.  Today she presents for follow-up from [facility] with SNF aide. Patient is a poor historian. She states she doesn't feel good. States, "I feel like I'm going to pass out." Denies any nausea, SNF aide states she ate breakfast this AM. Says she almost slid out of wheelchair. Denies any chest pain, palpitations,  presyncope, dizziness, orthopnea, PND, significant weight changes, acute bleeding, or claudication. Pt's chief complaints are shortness of breath that she feels has gotten worse after leaving the hospital and leg pain.   Studies Reviewed: .    Echo 10/06/2022:  1. Left ventricular ejection fraction, by estimation, is 25 to 30%. The  left ventricle has severely decreased function. The left ventricle  demonstrates global hypokinesis. There is mild left ventricular  hypertrophy. Left ventricular diastolic parameters   are indeterminate.   2. Right ventricular systolic function is mildly reduced. The right  ventricular size is mildly enlarged. There is mildly elevated pulmonary  artery systolic pressure.   3. Left atrial size was mildly dilated.   4. Large pleural effusion in the left lateral region.   5. The mitral valve is degenerative. Mild to moderate mitral valve  regurgitation. Mild mitral stenosis. The mean mitral valve gradient is 3.0  mmHg.HR is 75. Moderate mitral annular calcification.   6. The tricuspid valve is abnormal.   7. The aortic valve is tricuspid. There is severe calcifcation of the  aortic valve. There is  severe thickening of the aortic valve. Aortic valve  regurgitation is mild. Mild aortic valve stenosis.   8. The inferior vena cava is normal in size with <50% respiratory  variability, suggesting right atrial pressure of 8 mmHg.  Carotid duplex 07/2022:  IMPRESSION: 1. Although no high-grade right carotid stenosis is visualized, abnormal  waveforms in the right common and internal carotid arteries suggest a significant lesion. MRA may be useful for further evaluation. 2. No evident recurrent stenosis post left carotid endarterectomy 3. Antegrade bilateral vertebral arterial flow.  Physical Exam:   VS:  BP 132/64 (BP Location: Right Arm, Patient Position: Sitting, Cuff Size: Normal)   Pulse 93   Ht 5\' 4"  (1.626 m)   Wt 125 lb (56.7 kg)   SpO2 92%   BMI 21.46 kg/m    Wt Readings from Last 3 Encounters:  10/14/22 125 lb (56.7 kg)  10/01/22 135 lb 5.8 oz (61.4 kg)  09/01/22 122 lb 4.8 oz (55.5 kg)    GEN: Thin, frail 89 y.o. female in acute distress without oxygen on arrival, generalized weakness, appears failure to thrive NECK: No JVD; No carotid bruits CARDIAC: S1/S2, RRR, no murmurs, rubs, gallops RESPIRATORY:  Clear and diminished to auscultation without rales, wheezing or rhonchi, increased work of breathing and tachypnea noted EXTREMITIES:  Cool extremities, 1+ radial pulses. UTA, wrapped in [place] boots, leg edema to BLE, no deformity  ASSESSMENT AND PLAN: .    Shortness of breath, HFrEF Patient in acute distress on exam. Doesn't appear well and states she doesn't feel well. Says "I feel like I'm going to pass out." See PE above. CBG checked during visit and was 183. Placed 2 liters of oxygen via nasal cannula on patient and O2 went from 92% on room air to 96% on 2L of oxygen. Etiology unclear for shortness of breath at rest, with evidence of tachypnea, does require further workup in ED. Her skin is cool to touch and bilateral radial pulses are weak.  Spoke with POA (okay per DPR) and updated her about patient status and that EMS was called and pt was taken to nearby hospital for evaluation. POA verbalized understanding and was appreciative of my call. Pt presented to office appt today without oxygen and POA was called and POA stated she has been on oxygen since leaving the hospital. This NP gave report to EMS and helped  transfer pt to EMS stretcher and transfer of care has been given to EMS at this point.   Dispo: Follow-up with me or APP in 1-2 weeks post discharge from hospital or sooner if anything changes.   Signed, Sharlene Dory, NP

## 2022-10-14 NOTE — Patient Instructions (Addendum)
Medication Instructions:  Your physician recommends that you continue on your current medications as directed. Please refer to the Current Medication list given to you today.  Labwork: None  Testing/Procedures: None  Follow-Up: Your physician recommends that you schedule a follow-up appointment in: 1-2 weeks after discharge   Any Other Special Instructions Will Be Listed Below (If Applicable).  EMS called for Emergency transportation to ED   If you need a refill on your cardiac medications before your next appointment, please call your pharmacy.

## 2022-10-15 ENCOUNTER — Telehealth: Payer: Self-pay | Admitting: Internal Medicine

## 2022-10-15 DIAGNOSIS — R7989 Other specified abnormal findings of blood chemistry: Secondary | ICD-10-CM | POA: Diagnosis not present

## 2022-10-15 DIAGNOSIS — E43 Unspecified severe protein-calorie malnutrition: Secondary | ICD-10-CM | POA: Diagnosis not present

## 2022-10-15 DIAGNOSIS — N189 Chronic kidney disease, unspecified: Secondary | ICD-10-CM | POA: Diagnosis not present

## 2022-10-15 DIAGNOSIS — E039 Hypothyroidism, unspecified: Secondary | ICD-10-CM | POA: Diagnosis not present

## 2022-10-15 DIAGNOSIS — I5043 Acute on chronic combined systolic (congestive) and diastolic (congestive) heart failure: Secondary | ICD-10-CM | POA: Diagnosis not present

## 2022-10-15 DIAGNOSIS — E8770 Fluid overload, unspecified: Secondary | ICD-10-CM | POA: Diagnosis not present

## 2022-10-15 DIAGNOSIS — E1122 Type 2 diabetes mellitus with diabetic chronic kidney disease: Secondary | ICD-10-CM | POA: Diagnosis not present

## 2022-10-15 DIAGNOSIS — I129 Hypertensive chronic kidney disease with stage 1 through stage 4 chronic kidney disease, or unspecified chronic kidney disease: Secondary | ICD-10-CM | POA: Diagnosis not present

## 2022-10-15 DIAGNOSIS — Z79899 Other long term (current) drug therapy: Secondary | ICD-10-CM | POA: Diagnosis not present

## 2022-10-15 NOTE — Telephone Encounter (Signed)
Spoke to [place] with [facility] and verbalized why pt was sent to [facility] per providers note. Belinda had no further questions or concerns at this time.   Will fwd OV note to SNF

## 2022-10-15 NOTE — Telephone Encounter (Signed)
Belinda from [facility] calling back. Transferred to Leonides Schanz, CMA.

## 2022-10-15 NOTE — Telephone Encounter (Signed)
Left a message for Belinda at [facility] to call office back

## 2022-10-15 NOTE — Telephone Encounter (Signed)
Facility where pt resides would like a callback regarding why pt was sent to the hospital yesterday. Please advise

## 2022-10-16 DIAGNOSIS — N189 Chronic kidney disease, unspecified: Secondary | ICD-10-CM | POA: Diagnosis not present

## 2022-10-16 DIAGNOSIS — R7989 Other specified abnormal findings of blood chemistry: Secondary | ICD-10-CM | POA: Diagnosis not present

## 2022-10-16 DIAGNOSIS — E43 Unspecified severe protein-calorie malnutrition: Secondary | ICD-10-CM | POA: Diagnosis not present

## 2022-10-16 DIAGNOSIS — E039 Hypothyroidism, unspecified: Secondary | ICD-10-CM | POA: Diagnosis not present

## 2022-10-16 DIAGNOSIS — E1122 Type 2 diabetes mellitus with diabetic chronic kidney disease: Secondary | ICD-10-CM | POA: Diagnosis not present

## 2022-10-16 DIAGNOSIS — I130 Hypertensive heart and chronic kidney disease with heart failure and stage 1 through stage 4 chronic kidney disease, or unspecified chronic kidney disease: Secondary | ICD-10-CM | POA: Diagnosis not present

## 2022-10-16 DIAGNOSIS — Z9981 Dependence on supplemental oxygen: Secondary | ICD-10-CM | POA: Diagnosis not present

## 2022-10-16 DIAGNOSIS — Z79899 Other long term (current) drug therapy: Secondary | ICD-10-CM | POA: Diagnosis not present

## 2022-10-16 DIAGNOSIS — I5043 Acute on chronic combined systolic (congestive) and diastolic (congestive) heart failure: Secondary | ICD-10-CM | POA: Diagnosis not present

## 2022-10-16 DIAGNOSIS — E8770 Fluid overload, unspecified: Secondary | ICD-10-CM | POA: Diagnosis not present

## 2022-10-17 DIAGNOSIS — E43 Unspecified severe protein-calorie malnutrition: Secondary | ICD-10-CM | POA: Diagnosis not present

## 2022-10-17 DIAGNOSIS — Z9981 Dependence on supplemental oxygen: Secondary | ICD-10-CM | POA: Diagnosis not present

## 2022-10-17 DIAGNOSIS — Z79899 Other long term (current) drug therapy: Secondary | ICD-10-CM | POA: Diagnosis not present

## 2022-10-17 DIAGNOSIS — I5043 Acute on chronic combined systolic (congestive) and diastolic (congestive) heart failure: Secondary | ICD-10-CM | POA: Diagnosis not present

## 2022-10-17 DIAGNOSIS — E039 Hypothyroidism, unspecified: Secondary | ICD-10-CM | POA: Diagnosis not present

## 2022-10-17 DIAGNOSIS — N189 Chronic kidney disease, unspecified: Secondary | ICD-10-CM | POA: Diagnosis not present

## 2022-10-17 DIAGNOSIS — R7989 Other specified abnormal findings of blood chemistry: Secondary | ICD-10-CM | POA: Diagnosis not present

## 2022-10-17 DIAGNOSIS — E8770 Fluid overload, unspecified: Secondary | ICD-10-CM | POA: Diagnosis not present

## 2022-10-17 DIAGNOSIS — E1122 Type 2 diabetes mellitus with diabetic chronic kidney disease: Secondary | ICD-10-CM | POA: Diagnosis not present

## 2022-10-17 DIAGNOSIS — I130 Hypertensive heart and chronic kidney disease with heart failure and stage 1 through stage 4 chronic kidney disease, or unspecified chronic kidney disease: Secondary | ICD-10-CM | POA: Diagnosis not present

## 2022-10-18 DIAGNOSIS — Z79899 Other long term (current) drug therapy: Secondary | ICD-10-CM | POA: Diagnosis not present

## 2022-10-18 DIAGNOSIS — I5043 Acute on chronic combined systolic (congestive) and diastolic (congestive) heart failure: Secondary | ICD-10-CM | POA: Diagnosis not present

## 2022-10-18 DIAGNOSIS — R7989 Other specified abnormal findings of blood chemistry: Secondary | ICD-10-CM | POA: Diagnosis not present

## 2022-10-18 DIAGNOSIS — E1122 Type 2 diabetes mellitus with diabetic chronic kidney disease: Secondary | ICD-10-CM | POA: Diagnosis not present

## 2022-10-18 DIAGNOSIS — I130 Hypertensive heart and chronic kidney disease with heart failure and stage 1 through stage 4 chronic kidney disease, or unspecified chronic kidney disease: Secondary | ICD-10-CM | POA: Diagnosis not present

## 2022-10-18 DIAGNOSIS — E8770 Fluid overload, unspecified: Secondary | ICD-10-CM | POA: Diagnosis not present

## 2022-10-18 DIAGNOSIS — N189 Chronic kidney disease, unspecified: Secondary | ICD-10-CM | POA: Diagnosis not present

## 2022-10-18 DIAGNOSIS — E039 Hypothyroidism, unspecified: Secondary | ICD-10-CM | POA: Diagnosis not present

## 2022-10-19 DIAGNOSIS — E43 Unspecified severe protein-calorie malnutrition: Secondary | ICD-10-CM | POA: Diagnosis not present

## 2022-10-19 DIAGNOSIS — E039 Hypothyroidism, unspecified: Secondary | ICD-10-CM | POA: Diagnosis not present

## 2022-10-19 DIAGNOSIS — E8770 Fluid overload, unspecified: Secondary | ICD-10-CM | POA: Diagnosis not present

## 2022-10-19 DIAGNOSIS — R7989 Other specified abnormal findings of blood chemistry: Secondary | ICD-10-CM | POA: Diagnosis not present

## 2022-10-19 DIAGNOSIS — N189 Chronic kidney disease, unspecified: Secondary | ICD-10-CM | POA: Diagnosis not present

## 2022-10-19 DIAGNOSIS — I5043 Acute on chronic combined systolic (congestive) and diastolic (congestive) heart failure: Secondary | ICD-10-CM | POA: Diagnosis not present

## 2022-10-19 DIAGNOSIS — I130 Hypertensive heart and chronic kidney disease with heart failure and stage 1 through stage 4 chronic kidney disease, or unspecified chronic kidney disease: Secondary | ICD-10-CM | POA: Diagnosis not present

## 2022-10-19 DIAGNOSIS — E1122 Type 2 diabetes mellitus with diabetic chronic kidney disease: Secondary | ICD-10-CM | POA: Diagnosis not present

## 2022-10-19 DIAGNOSIS — Z79899 Other long term (current) drug therapy: Secondary | ICD-10-CM | POA: Diagnosis not present

## 2022-10-20 DIAGNOSIS — E8770 Fluid overload, unspecified: Secondary | ICD-10-CM | POA: Diagnosis not present

## 2022-10-20 DIAGNOSIS — I130 Hypertensive heart and chronic kidney disease with heart failure and stage 1 through stage 4 chronic kidney disease, or unspecified chronic kidney disease: Secondary | ICD-10-CM | POA: Diagnosis not present

## 2022-10-20 DIAGNOSIS — N189 Chronic kidney disease, unspecified: Secondary | ICD-10-CM | POA: Diagnosis not present

## 2022-10-20 DIAGNOSIS — R7989 Other specified abnormal findings of blood chemistry: Secondary | ICD-10-CM | POA: Diagnosis not present

## 2022-10-20 DIAGNOSIS — E43 Unspecified severe protein-calorie malnutrition: Secondary | ICD-10-CM | POA: Diagnosis not present

## 2022-10-20 DIAGNOSIS — Z79899 Other long term (current) drug therapy: Secondary | ICD-10-CM | POA: Diagnosis not present

## 2022-10-20 DIAGNOSIS — E1122 Type 2 diabetes mellitus with diabetic chronic kidney disease: Secondary | ICD-10-CM | POA: Diagnosis not present

## 2022-10-20 DIAGNOSIS — E039 Hypothyroidism, unspecified: Secondary | ICD-10-CM | POA: Diagnosis not present

## 2022-10-20 DIAGNOSIS — I5043 Acute on chronic combined systolic (congestive) and diastolic (congestive) heart failure: Secondary | ICD-10-CM | POA: Diagnosis not present

## 2022-10-21 DIAGNOSIS — N184 Chronic kidney disease, stage 4 (severe): Secondary | ICD-10-CM | POA: Diagnosis not present

## 2022-10-21 DIAGNOSIS — R52 Pain, unspecified: Secondary | ICD-10-CM | POA: Diagnosis not present

## 2022-10-21 DIAGNOSIS — E8770 Fluid overload, unspecified: Secondary | ICD-10-CM | POA: Diagnosis not present

## 2022-10-21 DIAGNOSIS — E039 Hypothyroidism, unspecified: Secondary | ICD-10-CM | POA: Diagnosis not present

## 2022-10-21 DIAGNOSIS — Z79899 Other long term (current) drug therapy: Secondary | ICD-10-CM | POA: Diagnosis not present

## 2022-10-21 DIAGNOSIS — I5042 Chronic combined systolic (congestive) and diastolic (congestive) heart failure: Secondary | ICD-10-CM | POA: Diagnosis not present

## 2022-10-21 DIAGNOSIS — J90 Pleural effusion, not elsewhere classified: Secondary | ICD-10-CM | POA: Diagnosis not present

## 2022-10-21 DIAGNOSIS — Z7189 Other specified counseling: Secondary | ICD-10-CM | POA: Diagnosis not present

## 2022-10-22 DIAGNOSIS — L03116 Cellulitis of left lower limb: Secondary | ICD-10-CM | POA: Diagnosis not present

## 2022-10-22 DIAGNOSIS — I5042 Chronic combined systolic (congestive) and diastolic (congestive) heart failure: Secondary | ICD-10-CM | POA: Diagnosis not present

## 2022-10-22 DIAGNOSIS — N184 Chronic kidney disease, stage 4 (severe): Secondary | ICD-10-CM | POA: Diagnosis not present

## 2022-10-22 DIAGNOSIS — R52 Pain, unspecified: Secondary | ICD-10-CM | POA: Diagnosis not present

## 2022-10-22 DIAGNOSIS — J90 Pleural effusion, not elsewhere classified: Secondary | ICD-10-CM | POA: Diagnosis not present

## 2022-10-22 DIAGNOSIS — R0989 Other specified symptoms and signs involving the circulatory and respiratory systems: Secondary | ICD-10-CM | POA: Diagnosis not present

## 2022-10-22 DIAGNOSIS — E039 Hypothyroidism, unspecified: Secondary | ICD-10-CM | POA: Diagnosis not present

## 2022-10-22 DIAGNOSIS — E8770 Fluid overload, unspecified: Secondary | ICD-10-CM | POA: Diagnosis not present

## 2022-10-22 DIAGNOSIS — R600 Localized edema: Secondary | ICD-10-CM | POA: Diagnosis not present

## 2022-10-22 DIAGNOSIS — R1312 Dysphagia, oropharyngeal phase: Secondary | ICD-10-CM | POA: Diagnosis not present

## 2022-10-22 DIAGNOSIS — L97529 Non-pressure chronic ulcer of other part of left foot with unspecified severity: Secondary | ICD-10-CM | POA: Diagnosis not present

## 2022-10-22 DIAGNOSIS — E1165 Type 2 diabetes mellitus with hyperglycemia: Secondary | ICD-10-CM | POA: Diagnosis not present

## 2022-10-22 DIAGNOSIS — I2510 Atherosclerotic heart disease of native coronary artery without angina pectoris: Secondary | ICD-10-CM | POA: Diagnosis not present

## 2022-10-23 DIAGNOSIS — Z79899 Other long term (current) drug therapy: Secondary | ICD-10-CM | POA: Diagnosis not present

## 2022-10-23 DIAGNOSIS — R1312 Dysphagia, oropharyngeal phase: Secondary | ICD-10-CM | POA: Diagnosis not present

## 2022-10-23 DIAGNOSIS — E869 Volume depletion, unspecified: Secondary | ICD-10-CM | POA: Diagnosis not present

## 2022-10-23 DIAGNOSIS — E43 Unspecified severe protein-calorie malnutrition: Secondary | ICD-10-CM | POA: Diagnosis not present

## 2022-10-23 DIAGNOSIS — R52 Pain, unspecified: Secondary | ICD-10-CM | POA: Diagnosis not present

## 2022-10-23 DIAGNOSIS — I5042 Chronic combined systolic (congestive) and diastolic (congestive) heart failure: Secondary | ICD-10-CM | POA: Diagnosis not present

## 2022-10-24 DIAGNOSIS — I5042 Chronic combined systolic (congestive) and diastolic (congestive) heart failure: Secondary | ICD-10-CM | POA: Diagnosis not present

## 2022-10-24 DIAGNOSIS — R1312 Dysphagia, oropharyngeal phase: Secondary | ICD-10-CM | POA: Diagnosis not present

## 2022-10-24 DIAGNOSIS — E869 Volume depletion, unspecified: Secondary | ICD-10-CM | POA: Diagnosis not present

## 2022-10-24 DIAGNOSIS — E43 Unspecified severe protein-calorie malnutrition: Secondary | ICD-10-CM | POA: Diagnosis not present

## 2022-10-24 DIAGNOSIS — Z79899 Other long term (current) drug therapy: Secondary | ICD-10-CM | POA: Diagnosis not present

## 2022-10-27 DIAGNOSIS — R1312 Dysphagia, oropharyngeal phase: Secondary | ICD-10-CM | POA: Diagnosis not present

## 2022-10-27 DIAGNOSIS — I5042 Chronic combined systolic (congestive) and diastolic (congestive) heart failure: Secondary | ICD-10-CM | POA: Diagnosis not present

## 2022-10-27 DIAGNOSIS — M6281 Muscle weakness (generalized): Secondary | ICD-10-CM | POA: Diagnosis not present

## 2022-10-28 DIAGNOSIS — R52 Pain, unspecified: Secondary | ICD-10-CM | POA: Diagnosis not present

## 2022-10-28 DIAGNOSIS — Z9181 History of falling: Secondary | ICD-10-CM | POA: Diagnosis not present

## 2022-10-28 DIAGNOSIS — I5042 Chronic combined systolic (congestive) and diastolic (congestive) heart failure: Secondary | ICD-10-CM | POA: Diagnosis not present

## 2022-10-28 DIAGNOSIS — R1312 Dysphagia, oropharyngeal phase: Secondary | ICD-10-CM | POA: Diagnosis not present

## 2022-10-28 DIAGNOSIS — M6281 Muscle weakness (generalized): Secondary | ICD-10-CM | POA: Diagnosis not present

## 2022-10-28 DIAGNOSIS — Z79899 Other long term (current) drug therapy: Secondary | ICD-10-CM | POA: Diagnosis not present

## 2022-10-28 DIAGNOSIS — R451 Restlessness and agitation: Secondary | ICD-10-CM | POA: Diagnosis not present

## 2022-10-29 DIAGNOSIS — M6281 Muscle weakness (generalized): Secondary | ICD-10-CM | POA: Diagnosis not present

## 2022-10-29 DIAGNOSIS — I5042 Chronic combined systolic (congestive) and diastolic (congestive) heart failure: Secondary | ICD-10-CM | POA: Diagnosis not present

## 2022-10-29 DIAGNOSIS — R1312 Dysphagia, oropharyngeal phase: Secondary | ICD-10-CM | POA: Diagnosis not present

## 2022-10-29 DIAGNOSIS — R52 Pain, unspecified: Secondary | ICD-10-CM | POA: Diagnosis not present

## 2022-10-29 DIAGNOSIS — Z79899 Other long term (current) drug therapy: Secondary | ICD-10-CM | POA: Diagnosis not present

## 2022-10-30 DIAGNOSIS — I5042 Chronic combined systolic (congestive) and diastolic (congestive) heart failure: Secondary | ICD-10-CM | POA: Diagnosis not present

## 2022-10-30 DIAGNOSIS — R1312 Dysphagia, oropharyngeal phase: Secondary | ICD-10-CM | POA: Diagnosis not present

## 2022-10-30 DIAGNOSIS — M6281 Muscle weakness (generalized): Secondary | ICD-10-CM | POA: Diagnosis not present

## 2022-10-31 DIAGNOSIS — I5042 Chronic combined systolic (congestive) and diastolic (congestive) heart failure: Secondary | ICD-10-CM | POA: Diagnosis not present

## 2022-10-31 DIAGNOSIS — M6281 Muscle weakness (generalized): Secondary | ICD-10-CM | POA: Diagnosis not present

## 2022-10-31 DIAGNOSIS — R1312 Dysphagia, oropharyngeal phase: Secondary | ICD-10-CM | POA: Diagnosis not present

## 2022-11-03 DIAGNOSIS — I5042 Chronic combined systolic (congestive) and diastolic (congestive) heart failure: Secondary | ICD-10-CM | POA: Diagnosis not present

## 2022-11-03 DIAGNOSIS — R1312 Dysphagia, oropharyngeal phase: Secondary | ICD-10-CM | POA: Diagnosis not present

## 2022-11-03 DIAGNOSIS — M6281 Muscle weakness (generalized): Secondary | ICD-10-CM | POA: Diagnosis not present

## 2022-11-04 DIAGNOSIS — R1312 Dysphagia, oropharyngeal phase: Secondary | ICD-10-CM | POA: Diagnosis not present

## 2022-11-04 DIAGNOSIS — I5042 Chronic combined systolic (congestive) and diastolic (congestive) heart failure: Secondary | ICD-10-CM | POA: Diagnosis not present

## 2022-11-04 DIAGNOSIS — M6281 Muscle weakness (generalized): Secondary | ICD-10-CM | POA: Diagnosis not present

## 2022-11-13 DIAGNOSIS — J942 Hemothorax: Secondary | ICD-10-CM | POA: Diagnosis not present

## 2022-11-18 ENCOUNTER — Ambulatory Visit: Payer: 59 | Admitting: Nurse Practitioner

## 2022-11-19 LAB — ACID FAST CULTURE WITH REFLEXED SENSITIVITIES (MYCOBACTERIA): Acid Fast Culture: NEGATIVE

## 2022-11-25 DEATH — deceased

## 2023-03-09 ENCOUNTER — Other Ambulatory Visit: Payer: 59 | Admitting: Urology
